# Patient Record
Sex: Male | Born: 1960 | Race: White | Hispanic: No | Smoking: Former smoker
Health system: Southern US, Community
[De-identification: ages and names within clinical notes are randomized; demographics above are authoritative.]

## PROBLEM LIST (undated history)

## (undated) DIAGNOSIS — R918 Other nonspecific abnormal finding of lung field: Secondary | ICD-10-CM

## (undated) DIAGNOSIS — F32A Depression, unspecified: Secondary | ICD-10-CM

## (undated) DIAGNOSIS — R569 Unspecified convulsions: Secondary | ICD-10-CM

## (undated) DIAGNOSIS — I10 Essential (primary) hypertension: Secondary | ICD-10-CM

## (undated) DIAGNOSIS — E785 Hyperlipidemia, unspecified: Secondary | ICD-10-CM

## (undated) DIAGNOSIS — G939 Disorder of brain, unspecified: Secondary | ICD-10-CM

## (undated) DIAGNOSIS — C3490 Malignant neoplasm of unspecified part of unspecified bronchus or lung: Secondary | ICD-10-CM

## (undated) DIAGNOSIS — E119 Type 2 diabetes mellitus without complications: Secondary | ICD-10-CM

## (undated) HISTORY — DX: Type 2 diabetes mellitus without complications: E11.9

## (undated) HISTORY — DX: Hyperlipidemia, unspecified: E78.5

## (undated) HISTORY — DX: Malignant neoplasm of unspecified part of unspecified bronchus or lung: C34.90

## (undated) HISTORY — PX: COLONOSCOPY: SHX174

---

## 2004-01-01 HISTORY — PX: OTHER SURGICAL HISTORY: SHX169

## 2006-09-11 ENCOUNTER — Ambulatory Visit: Payer: Self-pay | Admitting: Anesthesiology

## 2006-09-25 ENCOUNTER — Ambulatory Visit: Payer: Self-pay | Admitting: Anesthesiology

## 2006-10-24 ENCOUNTER — Ambulatory Visit: Payer: Self-pay | Admitting: Anesthesiology

## 2006-11-28 ENCOUNTER — Ambulatory Visit: Payer: Self-pay | Admitting: Anesthesiology

## 2007-01-07 ENCOUNTER — Ambulatory Visit: Payer: Self-pay | Admitting: Anesthesiology

## 2007-02-04 ENCOUNTER — Ambulatory Visit: Payer: Self-pay | Admitting: Anesthesiology

## 2007-02-04 ENCOUNTER — Other Ambulatory Visit: Payer: Self-pay

## 2009-06-07 ENCOUNTER — Ambulatory Visit: Payer: Self-pay

## 2014-03-15 ENCOUNTER — Ambulatory Visit: Payer: Self-pay | Admitting: Gastroenterology

## 2014-03-17 LAB — PATHOLOGY REPORT

## 2014-11-22 ENCOUNTER — Ambulatory Visit: Payer: Self-pay | Admitting: Gastroenterology

## 2015-01-27 DIAGNOSIS — I10 Essential (primary) hypertension: Secondary | ICD-10-CM | POA: Diagnosis not present

## 2015-01-27 DIAGNOSIS — E785 Hyperlipidemia, unspecified: Secondary | ICD-10-CM | POA: Diagnosis not present

## 2015-01-28 DIAGNOSIS — I10 Essential (primary) hypertension: Secondary | ICD-10-CM | POA: Diagnosis not present

## 2015-02-23 DIAGNOSIS — R202 Paresthesia of skin: Secondary | ICD-10-CM | POA: Diagnosis not present

## 2015-02-23 DIAGNOSIS — K219 Gastro-esophageal reflux disease without esophagitis: Secondary | ICD-10-CM | POA: Diagnosis not present

## 2015-02-23 DIAGNOSIS — E785 Hyperlipidemia, unspecified: Secondary | ICD-10-CM | POA: Diagnosis not present

## 2015-04-06 DIAGNOSIS — R05 Cough: Secondary | ICD-10-CM | POA: Diagnosis not present

## 2015-04-25 LAB — SURGICAL PATHOLOGY

## 2015-07-08 DIAGNOSIS — Z1211 Encounter for screening for malignant neoplasm of colon: Secondary | ICD-10-CM | POA: Diagnosis not present

## 2015-07-08 DIAGNOSIS — E538 Deficiency of other specified B group vitamins: Secondary | ICD-10-CM | POA: Diagnosis not present

## 2015-07-08 DIAGNOSIS — Z0189 Encounter for other specified special examinations: Secondary | ICD-10-CM | POA: Diagnosis not present

## 2015-07-08 DIAGNOSIS — R0602 Shortness of breath: Secondary | ICD-10-CM | POA: Diagnosis not present

## 2015-07-08 DIAGNOSIS — J449 Chronic obstructive pulmonary disease, unspecified: Secondary | ICD-10-CM | POA: Diagnosis not present

## 2015-07-08 DIAGNOSIS — Z0000 Encounter for general adult medical examination without abnormal findings: Secondary | ICD-10-CM | POA: Diagnosis not present

## 2015-07-08 DIAGNOSIS — R05 Cough: Secondary | ICD-10-CM | POA: Diagnosis not present

## 2015-07-13 ENCOUNTER — Other Ambulatory Visit: Payer: Self-pay | Admitting: Family Medicine

## 2015-07-13 DIAGNOSIS — R9389 Abnormal findings on diagnostic imaging of other specified body structures: Secondary | ICD-10-CM

## 2015-07-19 ENCOUNTER — Ambulatory Visit
Admission: RE | Admit: 2015-07-19 | Discharge: 2015-07-19 | Disposition: A | Discharge status: Home / Self Care | Payer: Commercial Managed Care - HMO | Source: Ambulatory Visit | Attending: Family Medicine | Admitting: Family Medicine

## 2015-07-19 DIAGNOSIS — J439 Emphysema, unspecified: Secondary | ICD-10-CM | POA: Diagnosis not present

## 2015-07-19 DIAGNOSIS — J432 Centrilobular emphysema: Secondary | ICD-10-CM | POA: Diagnosis not present

## 2015-07-19 DIAGNOSIS — R938 Abnormal findings on diagnostic imaging of other specified body structures: Secondary | ICD-10-CM | POA: Diagnosis present

## 2015-07-19 DIAGNOSIS — K760 Fatty (change of) liver, not elsewhere classified: Secondary | ICD-10-CM | POA: Insufficient documentation

## 2015-07-19 DIAGNOSIS — I2510 Atherosclerotic heart disease of native coronary artery without angina pectoris: Secondary | ICD-10-CM | POA: Insufficient documentation

## 2015-07-19 DIAGNOSIS — R9389 Abnormal findings on diagnostic imaging of other specified body structures: Secondary | ICD-10-CM

## 2015-07-19 HISTORY — DX: Essential (primary) hypertension: I10

## 2015-07-19 MED ORDER — IOHEXOL 300 MG/ML  SOLN
75.0000 mL | Freq: Once | INTRAMUSCULAR | Status: AC | PRN
  Administered 2015-07-19: 75 mL via INTRAVENOUS

## 2015-09-21 DIAGNOSIS — E538 Deficiency of other specified B group vitamins: Secondary | ICD-10-CM | POA: Diagnosis not present

## 2015-09-21 DIAGNOSIS — R7302 Impaired glucose tolerance (oral): Secondary | ICD-10-CM | POA: Diagnosis not present

## 2015-09-21 DIAGNOSIS — I10 Essential (primary) hypertension: Secondary | ICD-10-CM | POA: Diagnosis not present

## 2015-09-21 DIAGNOSIS — J449 Chronic obstructive pulmonary disease, unspecified: Secondary | ICD-10-CM | POA: Diagnosis not present

## 2015-10-11 ENCOUNTER — Encounter: Payer: Commercial Managed Care - HMO | Attending: Family Medicine | Admitting: *Deleted

## 2015-10-11 ENCOUNTER — Encounter: Payer: Self-pay | Admitting: *Deleted

## 2015-10-11 VITALS — BP 140/82 | Ht 75.0 in | Wt 261.7 lb

## 2015-10-11 DIAGNOSIS — E119 Type 2 diabetes mellitus without complications: Secondary | ICD-10-CM | POA: Insufficient documentation

## 2015-10-11 NOTE — Patient Instructions (Addendum)
Check blood sugars 2 x day before breakfast and 2 hrs after supper  3-4 x week Avoid sugar sweetened drinks (soda, tea, coffee, juices) Eat 3 meals day, 1-2 snacks a day Space meals 4-6 hours apart Don't skip meals Bring blood sugar records to the next class Call your doctor for a prescription for:  1. Meter strips (type) One Touch Ultra Blue  checking  3-4 times per week  2. Lancets (type) One Touch Delica       checking  3-4  times per week Quit smoking

## 2015-10-12 ENCOUNTER — Encounter: Payer: Self-pay | Admitting: *Deleted

## 2015-10-12 NOTE — Progress Notes (Signed)
Diabetes Self-Management Education  Visit Type: First/Initial  Appt. Start Time: 1110 Appt. End Time: 1230  10/12/2015  Tyler Gallagher, identified by name and date of birth, is a 54 y.o. male with a diagnosis of Diabetes: Type 2.   ASSESSMENT  Blood pressure 140/82, height '6\' 3"'$  (1.905 m), weight 261 lb 11.2 oz (118.706 kg). Body mass index is 32.71 kg/(m^2).      Diabetes Self-Management Education - 10/11/15 1306    Visit Information   Visit Type First/Initial   Initial Visit   Diabetes Type Type 2   Are you currently following a meal plan? No   Are you taking your medications as prescribed? No  stopped Metformin due to GI upset - will resume   Date Diagnosed 2-3 weeks ago   Health Coping   How would you rate your overall health? Fair   Psychosocial Assessment   Patient Belief/Attitude about Diabetes Other (comment)  "just another problem"   Self-care barriers Unsteady gait/risk for falls  Left aka - uses wheelchair   Self-management support Doctor's office;Friends   Patient Concerns Medication;Nutrition/Meal planning;Monitoring;Glycemic Control;Weight Control   Special Needs None   Preferred Learning Style Auditory;Hands on   Learning Readiness Ready   How often do you need to have someone help you when you read instructions, pamphlets, or other written materials from your doctor or pharmacy? 1 - Never   What is the last grade level you completed in school? 83TD   Complications   Last HgB A1C per patient/outside source 8 %  09/21/15   How often do you check your blood sugar? 0 times/day (not testing)  Provided One Touch Ultra 2 meter and instructed on use. BG upon return demonstration was 119 mg/dL at 12:20 pm - 4 hrs after drinking coffee with sugar.    Have you had a dilated eye exam in the past 12 months? No   Have you had a dental exam in the past 12 months? No  no teeth - has dentures   Are you checking your feet? No   Dietary Intake   Breakfast skips or  eats sausage biscuit   Lunch skips or eats sandwich with chips   Snack (afternoon) peanut butter   Dinner grilled meat - pork chop or chicken with green beans, corn, peas, slaw, turnip greens   Snack (evening) ice cream   Beverage(s) water, sugar sweetened sodas, tea, coffee   Exercise   Exercise Type ADL's   Patient Education   Previous Diabetes Education No   Disease state  Definition of diabetes, type 1 and 2, and the diagnosis of diabetes;Explored patient's options for treatment of their diabetes;Factors that contribute to the development of diabetes   Nutrition management  Role of diet in the treatment of diabetes and the relationship between the three main macronutrients and blood glucose level   Medications Reviewed patients medication for diabetes, action, purpose, timing of dose and side effects.   Monitoring Taught/evaluated SMBG meter.;Purpose and frequency of SMBG.;Identified appropriate SMBG and/or A1C goals.   Chronic complications Relationship between chronic complications and blood glucose control   Psychosocial adjustment Role of stress on diabetes   Personal strategies to promote health Review risk of smoking and offered smoking cessation   Individualized Goals (developed by patient)   Reducing Risk Improve blood sugars Lose weight Quit smoking Become more fit   Outcomes   Expected Outcomes Demonstrated interest in learning. Expect positive outcomes      Individualized Plan for Diabetes Self-Management  Training:   Learning Objective:  Patient will have a greater understanding of diabetes self-management. Patient education plan is to attend individual and/or group sessions per assessed needs and concerns.   Plan:   Patient Instructions  Check blood sugars 2 x day before breakfast and 2 hrs after supper  3-4 x week Avoid sugar sweetened drinks (soda, tea, coffee, juices) Eat 3 meals day, 1-2 snacks a day Space meals 4-6 hours apart Don't skip meals Bring blood  sugar records to the next class Call your doctor for a prescription for:  1. Meter strips (type) One Touch Ultra Blue  checking  3-4 times per week  2. Lancets (type) One Touch Delica       checking  3-4  times per week Quit smoking   Expected Outcomes:  Demonstrated interest in learning. Expect positive outcomes  Education material provided:  General Meal Planning Guidelines Meter - One Touch Ultra 2  If problems or questions, patient to contact team via:   Johny Drilling, [place], [place], CDE [phone]  Future DSME appointment:   October 13, 2015 for Class 1

## 2015-10-13 ENCOUNTER — Telehealth: Payer: Self-pay | Admitting: *Deleted

## 2015-10-13 ENCOUNTER — Ambulatory Visit: Payer: Commercial Managed Care - HMO

## 2015-10-13 NOTE — Telephone Encounter (Signed)
Pt didn't show for class 1 today. Phone call to patient's number with no answer after multiple rings.

## 2015-10-20 ENCOUNTER — Ambulatory Visit: Payer: Commercial Managed Care - HMO

## 2015-10-27 ENCOUNTER — Ambulatory Visit: Payer: Commercial Managed Care - HMO

## 2015-11-07 ENCOUNTER — Encounter: Payer: Self-pay | Admitting: *Deleted

## 2015-12-28 DIAGNOSIS — R7302 Impaired glucose tolerance (oral): Secondary | ICD-10-CM | POA: Diagnosis not present

## 2016-01-01 ENCOUNTER — Inpatient Hospital Stay
Admission: EM | Admit: 2016-01-01 | Discharge: 2016-01-02 | DRG: 123 | Disposition: A | Discharge status: Home / Self Care | Payer: Commercial Managed Care - HMO | Attending: Internal Medicine | Admitting: Internal Medicine

## 2016-01-01 ENCOUNTER — Encounter: Payer: Self-pay | Admitting: Emergency Medicine

## 2016-01-01 ENCOUNTER — Emergency Department: Payer: Commercial Managed Care - HMO

## 2016-01-01 ENCOUNTER — Inpatient Hospital Stay: Payer: Commercial Managed Care - HMO

## 2016-01-01 DIAGNOSIS — Z89619 Acquired absence of unspecified leg above knee: Secondary | ICD-10-CM

## 2016-01-01 DIAGNOSIS — E871 Hypo-osmolality and hyponatremia: Secondary | ICD-10-CM | POA: Diagnosis present

## 2016-01-01 DIAGNOSIS — I10 Essential (primary) hypertension: Secondary | ICD-10-CM | POA: Diagnosis not present

## 2016-01-01 DIAGNOSIS — Z720 Tobacco use: Secondary | ICD-10-CM

## 2016-01-01 DIAGNOSIS — E669 Obesity, unspecified: Secondary | ICD-10-CM

## 2016-01-01 DIAGNOSIS — H539 Unspecified visual disturbance: Secondary | ICD-10-CM

## 2016-01-01 DIAGNOSIS — E785 Hyperlipidemia, unspecified: Secondary | ICD-10-CM

## 2016-01-01 DIAGNOSIS — G459 Transient cerebral ischemic attack, unspecified: Secondary | ICD-10-CM | POA: Diagnosis not present

## 2016-01-01 DIAGNOSIS — E119 Type 2 diabetes mellitus without complications: Secondary | ICD-10-CM | POA: Diagnosis present

## 2016-01-01 DIAGNOSIS — G453 Amaurosis fugax: Principal | ICD-10-CM | POA: Diagnosis present

## 2016-01-01 DIAGNOSIS — I6523 Occlusion and stenosis of bilateral carotid arteries: Secondary | ICD-10-CM | POA: Diagnosis not present

## 2016-01-01 DIAGNOSIS — I639 Cerebral infarction, unspecified: Secondary | ICD-10-CM | POA: Diagnosis present

## 2016-01-01 DIAGNOSIS — Z7982 Long term (current) use of aspirin: Secondary | ICD-10-CM

## 2016-01-01 DIAGNOSIS — F17210 Nicotine dependence, cigarettes, uncomplicated: Secondary | ICD-10-CM | POA: Diagnosis present

## 2016-01-01 DIAGNOSIS — Z833 Family history of diabetes mellitus: Secondary | ICD-10-CM

## 2016-01-01 DIAGNOSIS — H5310 Unspecified subjective visual disturbances: Secondary | ICD-10-CM | POA: Diagnosis not present

## 2016-01-01 DIAGNOSIS — E781 Pure hyperglyceridemia: Secondary | ICD-10-CM | POA: Diagnosis not present

## 2016-01-01 LAB — APTT: aPTT: 27 seconds (ref 24–36)

## 2016-01-01 LAB — BASIC METABOLIC PANEL
Anion gap: 9 (ref 5–15)
BUN: 10 mg/dL (ref 6–20)
CHLORIDE: 100 mmol/L — AB (ref 101–111)
CO2: 31 mmol/L (ref 22–32)
CREATININE: 0.86 mg/dL (ref 0.61–1.24)
Calcium: 9.4 mg/dL (ref 8.9–10.3)
Glucose, Bld: 135 mg/dL — ABNORMAL HIGH (ref 65–99)
POTASSIUM: 3.5 mmol/L (ref 3.5–5.1)
SODIUM: 140 mmol/L (ref 135–145)

## 2016-01-01 LAB — CBC WITH DIFFERENTIAL/PLATELET
BASOS PCT: 1 %
Basophils Absolute: 0.1 10*3/uL (ref 0–0.1)
EOS ABS: 0.1 10*3/uL (ref 0–0.7)
Eosinophils Relative: 1 %
HCT: 48 % (ref 40.0–52.0)
HEMOGLOBIN: 16.8 g/dL (ref 13.0–18.0)
Lymphocytes Relative: 27 %
Lymphs Abs: 2.3 10*3/uL (ref 1.0–3.6)
MCH: 31.8 pg (ref 26.0–34.0)
MCHC: 35 g/dL (ref 32.0–36.0)
MCV: 90.9 fL (ref 80.0–100.0)
Monocytes Absolute: 0.4 10*3/uL (ref 0.2–1.0)
Monocytes Relative: 5 %
Neutro Abs: 5.6 10*3/uL (ref 1.4–6.5)
Neutrophils Relative %: 66 %
Platelets: 227 10*3/uL (ref 150–440)
RBC: 5.28 MIL/uL (ref 4.40–5.90)
RDW: 15 % — ABNORMAL HIGH (ref 11.5–14.5)
WBC: 8.5 10*3/uL (ref 3.8–10.6)

## 2016-01-01 LAB — LIPID PANEL
CHOL/HDL RATIO: 10 ratio
CHOLESTEROL: 249 mg/dL — AB (ref 0–200)
Cholesterol: 233 mg/dL — ABNORMAL HIGH (ref 0–200)
HDL: 25 mg/dL — AB (ref >40)
HDL: 26 mg/dL — AB (ref >40)
LDL Cholesterol: UNDETERMINED mg/dL (ref 0–99)
LDL Cholesterol: UNDETERMINED mg/dL (ref 0–99)
Total CHOL/HDL Ratio: 9 RATIO
Triglycerides: 644 mg/dL — ABNORMAL HIGH (ref <150)
Triglycerides: 514 mg/dL — ABNORMAL HIGH (ref <150)
VLDL: UNDETERMINED mg/dL (ref 0–40)
VLDL: UNDETERMINED mg/dL (ref 0–40)

## 2016-01-01 LAB — PROTIME-INR
INR: 0.96
PROTHROMBIN TIME: 13 s (ref 11.4–15.0)

## 2016-01-01 LAB — GLUCOSE, CAPILLARY: Glucose-Capillary: 136 mg/dL — ABNORMAL HIGH (ref 65–99)

## 2016-01-01 LAB — SEDIMENTATION RATE: Sed Rate: 16 mm/hr (ref 0–20)

## 2016-01-01 MED ORDER — BISOPROLOL-HYDROCHLOROTHIAZIDE 5-6.25 MG PO TABS
1.0000 | ORAL_TABLET | Freq: Every day | ORAL | Status: DC
  Filled 2016-01-01 (×2): qty 1

## 2016-01-01 MED ORDER — PIOGLITAZONE HCL 15 MG PO TABS
30.0000 mg | ORAL_TABLET | Freq: Every day | ORAL | Status: DC
  Administered 2016-01-02: 11:00:00 30 mg via ORAL
  Filled 2016-01-01: qty 2

## 2016-01-01 MED ORDER — GABAPENTIN 300 MG PO CAPS
900.0000 mg | ORAL_CAPSULE | Freq: Three times a day (TID) | ORAL | Status: DC
  Administered 2016-01-01 – 2016-01-02 (×3): 900 mg via ORAL
  Filled 2016-01-01 (×3): qty 3

## 2016-01-01 MED ORDER — VITAMIN B-12 1000 MCG PO TABS
1000.0000 ug | ORAL_TABLET | Freq: Every day | ORAL | Status: DC
  Administered 2016-01-02: 11:00:00 1000 ug via ORAL
  Filled 2016-01-01: qty 1

## 2016-01-01 MED ORDER — ASPIRIN 81 MG PO CHEW
324.0000 mg | CHEWABLE_TABLET | Freq: Once | ORAL | Status: AC
  Administered 2016-01-01: 324 mg via ORAL
  Filled 2016-01-01: qty 4

## 2016-01-01 MED ORDER — METFORMIN HCL ER 500 MG PO TB24
750.0000 mg | ORAL_TABLET | Freq: Two times a day (BID) | ORAL | Status: DC
  Administered 2016-01-01 – 2016-01-02 (×2): 750 mg via ORAL
  Filled 2016-01-01: qty 1
  Filled 2016-01-01: qty 2

## 2016-01-01 MED ORDER — STROKE: EARLY STAGES OF RECOVERY BOOK
Freq: Once | Status: AC
  Administered 2016-01-01: 23:00:00

## 2016-01-01 MED ORDER — ENOXAPARIN SODIUM 40 MG/0.4ML ~~LOC~~ SOLN
40.0000 mg | SUBCUTANEOUS | Status: DC
  Administered 2016-01-01: 23:00:00 40 mg via SUBCUTANEOUS
  Filled 2016-01-01: qty 0.4

## 2016-01-01 MED ORDER — INSULIN ASPART 100 UNIT/ML ~~LOC~~ SOLN: 0.0000 [IU] | Freq: Three times a day (TID) | SUBCUTANEOUS | Status: DC

## 2016-01-01 MED ORDER — GEMFIBROZIL 600 MG PO TABS
300.0000 mg | ORAL_TABLET | Freq: Two times a day (BID) | ORAL | Status: DC
  Administered 2016-01-01 – 2016-01-02 (×2): 300 mg via ORAL
  Filled 2016-01-01 (×3): qty 0.5

## 2016-01-01 MED ORDER — SENNOSIDES-DOCUSATE SODIUM 8.6-50 MG PO TABS: 1.0000 | ORAL_TABLET | Freq: Every evening | ORAL | Status: DC | PRN

## 2016-01-01 NOTE — ED Notes (Signed)
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

## 2016-01-01 NOTE — ED Notes (Signed)
Dr Edd Fabian to consult neuro. SOC not recommended

## 2016-01-01 NOTE — ED Provider Notes (Signed)
[facility] Emergency Department Provider Note  ____________________________________________  Time seen: Approximately 5:06 PM  I have reviewed the triage vital signs and the nursing notes.   HISTORY  Chief Complaint Loss of Vision    HPI Tyler Gallagher is a 55 y.o. male with hypertension and hyponatremia, diabetes, history of remote left AKA who presents for evaluation of waxing and waning vision in the right eye which began approximately 3 hours ago, gradual onset, intermittent, currently severe, no modifying factors. He denies any trauma to the eye. He denies any drainage from the eye. He denies any numbness, weakness, headache, speech difficulty. He denies any pain in the eye but reports that sometimes he has felt pressure in the eye today. Earlier today he reports he lost complete vision in the eye and was only able to appreciate colors but reports then his vision improved significantly for a time and almost returned to normal. He is continuing to see flashes of purple and yellow light. He has otherwise been in his usual state of health without chest pain, difficulty breathing, coughing, sneezing, runny nose, congestion, vomiting, diarrhea, fevers or chills. He report this has happened to him in the past but previously symptoms were brief and resolved completely.   Past Medical History  Diagnosis Date  . Hypertension   . Diabetes mellitus without complication ([place])   . Hyperlipidemia     There are no active problems to display for this patient.   Past Surgical History  Procedure Laterality Date  . Amputation Left 2005    AKA    Current Outpatient Rx  Name  Route  Sig  Dispense  Refill  . albuterol (PROAIR HFA) 108 (90 BASE) MCG/ACT inhaler   Inhalation   Inhale 2 puffs into the lungs every 4 (four) hours as needed.          . bisoprolol-hydrochlorothiazide (ZIAC) 5-6.25 MG tablet   Oral   Take 1 tablet by mouth daily.         .  budesonide-formoterol (SYMBICORT) 160-4.5 MCG/ACT inhaler   Inhalation   Inhale 2 puffs into the lungs 2 (two) times daily.         . Cyanocobalamin (RA VITAMIN B-12 TR) 1000 MCG TBCR   Oral   Take 1 tablet by mouth daily.          Marland Kitchen gabapentin (NEURONTIN) 300 MG capsule   Oral   Take 3 capsules by mouth 3 (three) times daily.         Marland Kitchen gemfibrozil (LOPID) 600 MG tablet   Oral   Take 0.5 tablets by mouth 2 (two) times daily.         . metFORMIN (GLUCOPHAGE-XR) 750 MG 24 hr tablet      Take 1 tablet by mouth for 4-6 days, then 1 tablet by mouth two times a day.         Marland Kitchen omeprazole (PRILOSEC) 20 MG capsule      TAKE 1 CAPSULE (20 MG TOTAL) BY MOUTH ONCE DAILY.           Allergies Review of patient's allergies indicates no known allergies.  Family History  Problem Relation Age of Onset  . Diabetes Sister   . Diabetes Brother     Social History Social History  Substance Use Topics  . Smoking status: Current Every Day Smoker -- 1.00 packs/day for 32 years    Types: Cigarettes  . Smokeless tobacco: Never Used  . Alcohol Use: No  Review of Systems Constitutional: No fever/chills Eyes: No visual changes. ENT: No sore throat. Cardiovascular: Denies chest pain. Respiratory: Denies shortness of breath. Gastrointestinal: No abdominal pain.  No nausea, no vomiting.  No diarrhea.  No constipation. Genitourinary: Negative for dysuria. Musculoskeletal: Negative for back pain. Skin: Negative for rash. Neurological: Negative for headaches, focal weakness or numbness.  10-point ROS otherwise negative.  ____________________________________________   PHYSICAL EXAM:  VITAL SIGNS: ED Triage Vitals  Enc Vitals Group     BP 01/01/16 1642 138/83 mmHg     Pulse Rate 01/01/16 1642 104     Resp 01/01/16 1642 18     Temp 01/01/16 1642 98.1 F (36.7 C)     Temp Source 01/01/16 1642 Oral     SpO2 01/01/16 1642 96 %     Weight 01/01/16 1642 280 lb (127.007 kg)      Height 01/01/16 1642 '6\' 3"'$  (1.905 m)     Head Cir --      Peak Flow --      Pain Score 01/01/16 1642 0     Pain Loc --      Pain Edu? --      Excl. in [place]? --     Constitutional: Alert and oriented. Well appearing and in no acute distress. Eyes: Conjunctivae are normal. PERRL. EOMI. pupils 3 mm bilaterally and briskly reactive to light. Intraocular pressure 16 Head: Atraumatic. Nose: No congestion/rhinnorhea. Mouth/Throat: Mucous membranes are moist.  Oropharynx non-erythematous. Neck: No stridor.  Cardiovascular: Normal rate, regular rhythm. Grossly normal heart sounds.  Good peripheral circulation. Respiratory: Normal respiratory effort.  No retractions. Lungs CTAB. Gastrointestinal: Soft and nontender. No distention.  No CVA tenderness. Genitourinary: deferred Musculoskeletal: No lower extremity tenderness nor edema.  No joint effusions. Neurologic:  Normal speech and language. No gross focal neurologic deficits are appreciated. No gait instability. 5 out of 5 strength in bilateral upper and lower strength, sensation intact to light touch throughout, cranial nerves II through XII intact. Skin:  Skin is warm, dry and intact. No rash noted. Psychiatric: Mood and affect are normal. Speech and behavior are normal.  ____________________________________________   LABS (all labs ordered are listed, but only abnormal results are displayed)  Labs Reviewed  CBC WITH DIFFERENTIAL/PLATELET - Abnormal; Notable for the following:    RDW 15.0 (*)    All other components within normal limits  BASIC METABOLIC PANEL - Abnormal; Notable for the following:    Chloride 100 (*)    Glucose, Bld 135 (*)    All other components within normal limits  GLUCOSE, CAPILLARY - Abnormal; Notable for the following:    Glucose-Capillary 136 (*)    All other components within normal limits  PROTIME-INR  APTT  CBG MONITORING, ED   ____________________________________________  EKG  ED ECG REPORT I,  Joanne Gavel, the attending physician, personally viewed and interpreted this ECG.   Date: 01/01/2016  EKG Time: 17:56  Rate: 100  Rhythm: normal sinus rhythm  Axis: right axis  Intervals:none  ST&T Change: No acute ST elevation. A wave inversions in V2, V3.  ____________________________________________  RADIOLOGY  CT head  IMPRESSION: No acute intracranial abnormality.  Small posterior right frontal chronic infarct.  ____________________________________________   PROCEDURES  Procedure(s) performed: None  Critical Care performed: Yes, see critical care note(s). Total critical care time spent 30 minutes.  ____________________________________________   INITIAL IMPRESSION / ASSESSMENT AND PLAN / ED COURSE  Pertinent labs & imaging results that were available during my care of  the patient were reviewed by me and considered in my medical decision making (see chart for details).  Tyler Gallagher is a 55 y.o. male with hypertension and hyponatremia, diabetes, history of remote left AKA who presents for evaluation of vision in the right eye which began approximately 3 hours ago. On exam, he is generally well-appearing and in no acute distress. On arrival, he had significant visual loss in the right eye however throughout the course of my examination and history taking, this began to improve. He has no other symptoms at this time. And he otherwise has an intact neurological examination. Normal intraocular pressure. Will discuss case with ophthalmology.   ----------------------------------------- 5:40 PM on 01/01/2016 ----------------------------------------- I discussed the case with Dr. Charlann Boxer, on-call for ophthalmology, and described the patient's examination findings as well as the fact that his vision continues to get better without any intervention. Initial visual acuity in the right eye was 20/200, left eye was 20/25, at this time it visual acuity in the right eye is  20/50 and the patient reports that he still sees some small floaters but  his vision is much improved. She reports most likely diagnosis iss an ocular TIA and recommends full stroke workup. We will initiate a code stroke though at this time he is not a candidate for TPA because of rapid improvement of his symptoms. We'll obtain CT head, labs, anticipate admission.  ----------------------------------------- 6:51 PM on 01/01/2016 ----------------------------------------- Labs review. Normal CBC, BMP, coags. CT head negative for any acute intracranial process though there is evidence of old infarcts. I discussed the case with Dr. Irish Elders of neurology who has evaluated the patient and agrees with admission for TIA/stroke workup. At this time, patient reports that his vision is completely back to normal. We'll give full dose aspirin.   ____________________________________________   FINAL CLINICAL IMPRESSION(S) / ED DIAGNOSES  Final diagnoses:  Transient cerebral ischemia, unspecified transient cerebral ischemia type      Joanne Gavel, MD 01/01/16 [phone]

## 2016-01-01 NOTE — Consult Note (Signed)
CC: R eye blurry vision   HPI: Tyler Gallagher is an 55 y.o. male with history of HTN, HLD, DM, 1ppd smoker presents with transient decreased vision from R eye.  Pt states that at 2:30 PM had sudden onset of decreased vision from R eye. In ED initial examination was 20/200 followed by 20/50. Now back to baseline. Was not on any anti platelet therapy.   Past Medical History  Diagnosis Date  . Hypertension   . Diabetes mellitus without complication ([place])   . Hyperlipidemia     Past Surgical History  Procedure Laterality Date  . Amputation Left 2005    AKA    Family History  Problem Relation Age of Onset  . Diabetes Sister   . Diabetes Brother     Social History:  reports that he has been smoking Cigarettes.  He has a 32 pack-year smoking history. He has never used smokeless tobacco. He reports that he does not drink alcohol. His drug history is not on file.  No Known Allergies  Medications: I have reviewed the patient's current medications.    Physical Examination: Blood pressure 112/92, pulse 100, temperature 98.1 F (36.7 C), temperature source Oral, resp. rate 15, height '6\' 3"'$  (1.905 m), weight 280 lb (127.007 kg), SpO2 97 %.    Neurological Examination Mental Status: Alert, oriented, thought content appropriate.  Speech fluent without evidence of aphasia.  Able to follow 3 step commands without difficulty. 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   Tone and  bulk:normal tone throughout; no atrophy noted Sensory: Pinprick and light touch intact throughout, bilaterally Deep Tendon Reflexes: 1+ and symmetric throughout Plantars: Right: downgoing   Left: downgoing Cerebellar: normal finger-to-nose, normal rapid alternating movements and normal heel-to-shin test       Laboratory Studies:   Basic Metabolic Panel:  Recent Labs Lab 01/01/16 1712  NA 140  K 3.5  CL 100*  CO2 31  GLUCOSE 135*  BUN 10  CREATININE 0.86  CALCIUM 9.4    Liver Function Tests: No results for input(s): AST, ALT, ALKPHOS, BILITOT, PROT, ALBUMIN in the last 168 hours. No results for input(s): LIPASE, AMYLASE in the last 168 hours. No results for input(s): AMMONIA in the last 168 hours.  CBC:  Recent Labs Lab 01/01/16 1712  WBC 8.5  NEUTROABS 5.6  HGB 16.8  HCT 48.0  MCV 90.9  PLT 227    Cardiac Enzymes: No results for input(s): CKTOTAL, CKMB, CKMBINDEX, TROPONINI in the last 168 hours.  BNP: Invalid input(s): POCBNP  CBG:  Recent Labs Lab 01/01/16 1752  GLUCAP 136*    Microbiology: No results found for this or any previous visit.  Coagulation Studies:  Recent Labs  01/01/16 1712  LABPROT 13.0  INR 0.96    Urinalysis: No results for input(s): COLORURINE, LABSPEC, PHURINE, GLUCOSEU, HGBUR, BILIRUBINUR, KETONESUR, PROTEINUR, UROBILINOGEN, NITRITE, LEUKOCYTESUR in the last 168 hours.  Invalid input(s): APPERANCEUR  Lipid Panel:  No results found for: CHOL, TRIG,  HDL, CHOLHDL, VLDL, LDLCALC  HgbA1C: No results found for: HGBA1C  Urine Drug Screen:  No results found for: LABOPIA, COCAINSCRNUR, LABBENZ, AMPHETMU, THCU, LABBARB  Alcohol Level: No results for input(s): ETH in the last 168 hours.  Other results: EKG: normal EKG, normal sinus rhythm, unchanged from previous tracings.  Imaging: Ct Head Wo Contrast  01/01/2016  CLINICAL DATA:  Vision loss in right arm. EXAM: CT HEAD WITHOUT CONTRAST TECHNIQUE: Contiguous axial  images were obtained from the base of the skull through the vertex without intravenous contrast. COMPARISON:  None. FINDINGS: There is no evidence for acute hemorrhage, hydrocephalus, mass lesion, or abnormal extra-axial fluid collection. No definite CT evidence for acute infarction. Bold posterior right frontal infarct noted. Small lacunar infarct identified in the left frontal lobe. The visualized paranasal sinuses and mastoid air cells are clear. IMPRESSION: No acute intracranial abnormality. Small posterior right frontal chronic infarct. Electronically Signed   By: Misty Stanley M.D.   On: 01/01/2016 17:59     Assessment/Plan:  55 y.o. male with history of HTN, HLD, DM, 1ppd smoker presents with transient decreased vision from R eye.  Pt states that at 2:30 PM had sudden onset of decreased vision from R eye. In ED initial examination was 20/200 followed by 20/50. Now back to baseline. Was not on any anti platelet therapy.   - ASA and statin daily - R eye pressure of 16 - MRI brain and MRA head - likely due to atherosclerotic dz - s/p discussion in regard to smoking cessation.  - Likely amaurosis fugax that resolved and is in danger for retinal artery occlusion.  Leotis Pain  01/01/2016, 7:08 PM

## 2016-01-01 NOTE — H&P (Signed)
[facility] [place] NAME: Tyler Gallagher    MR#:  [id number]  DATE OF BIRTH:  11-24-1961  DATE OF ADMISSION:  01/01/2016  PRIMARY CARE PHYSICIAN: Juluis Pitch, MD   REQUESTING/REFERRING PHYSICIAN: dr Edd Fabian  CHIEF COMPLAINT:  Lesion changes right eye more than left today.  HISTORY OF PRESENT ILLNESS:  Tyler Gallagher  is a 55 y.o. male with a known history of left above-the-knee amputation in 2005, hypertension, type 2 diabetes, hyperlipidemia, ongoing tobacco abuse comes to the emergency room with complaints of right-sided eye vision changes since 2:30 this afternoon. Patient reports going to his convenience store and thereafter to grocery store where he felt his right eye started having some purple color vision changes blurred vision along with loss of vision. On an hour in the right eye. Currently during my evaluation patient has regained most of his vision with some mild blurred vision in the right eye. He denies any dysarthria or any focal weakness. Patient is being admitted for possible TIA. ER physician talked with ophthalmologist on call and given his symptoms and visual changes it was thought patient has ocular TIA  CT of the head is negative. No history of stroke in the past.   PAST MEDICAL HISTORY:   Past Medical History  Diagnosis Date  . Hypertension   . Diabetes mellitus without complication ([place])   . Hyperlipidemia     PAST SURGICAL HISTOIRY:   Past Surgical History  Procedure Laterality Date  . Amputation Left 2005    AKA    SOCIAL HISTORY:   Social History  Substance Use Topics  . Smoking status: Current Every Day Smoker -- 1.00 packs/day for 32 years    Types: Cigarettes  . Smokeless tobacco: Never Used  . Alcohol Use: No    FAMILY HISTORY:   Family History  Problem Relation Age of Onset  . Diabetes Sister   . Diabetes Brother     DRUG ALLERGIES:  No Known Allergies  REVIEW OF SYSTEMS:   Review of Systems  Constitutional: Negative for fever, chills and weight loss.  HENT: Negative for ear discharge, ear pain and nosebleeds.   Eyes: Positive for blurred vision. Negative for pain and discharge.  Respiratory: Negative for sputum production, shortness of breath, wheezing and stridor.   Cardiovascular: Negative for chest pain, palpitations, orthopnea and PND.  Gastrointestinal: Negative for nausea, vomiting, abdominal pain and diarrhea.  Genitourinary: Negative for urgency and frequency.  Musculoskeletal: Negative for back pain and joint pain.  Neurological: Negative for sensory change, speech change, focal weakness and weakness.  Psychiatric/Behavioral: Negative for depression and hallucinations. The patient is not nervous/anxious.   All other systems reviewed and are negative.    MEDICATIONS AT HOME:   Prior to Admission medications   Medication Sig Start Date End Date Taking? Authorizing Provider  albuterol (PROAIR HFA) 108 (90 BASE) MCG/ACT inhaler Inhale 2 puffs into the lungs every 4 (four) hours as needed.  07/21/15 07/20/16  Historical Provider, MD  bisoprolol-hydrochlorothiazide (ZIAC) 5-6.25 MG tablet Take 1 tablet by mouth daily. 09/08/15   Historical Provider, MD  budesonide-formoterol (SYMBICORT) 160-4.5 MCG/ACT inhaler Inhale 2 puffs into the lungs 2 (two) times daily. 07/08/15 07/07/16  Historical Provider, MD  Cyanocobalamin (RA VITAMIN B-12 TR) 1000 MCG TBCR Take 1 tablet by mouth daily.     Historical Provider, MD  gabapentin (NEURONTIN) 300 MG capsule Take 3 capsules by mouth 3 (three) times daily. 03/03/15   Historical Provider,  MD  gemfibrozil (LOPID) 600 MG tablet Take 0.5 tablets by mouth 2 (two) times daily. 01/07/15   Historical Provider, MD  metFORMIN (GLUCOPHAGE-XR) 750 MG 24 hr tablet Take 1 tablet by mouth for 4-6 days, then 1 tablet by mouth two times a day. 09/29/15   Historical Provider, MD  omeprazole (PRILOSEC) 20 MG capsule TAKE 1 CAPSULE (20 MG TOTAL) BY  MOUTH ONCE DAILY. 03/16/15   Historical Provider, MD      VITAL SIGNS:  Blood pressure 112/92, pulse 100, temperature 98.1 F (36.7 C), temperature source Oral, resp. rate 15, height '6\' 3"'$  (1.905 m), weight 127.007 kg (280 lb), SpO2 97 %.  PHYSICAL EXAMINATION:  GENERAL:  55 y.o.-year-old patient lying in the bed with no acute distress.  EYES: Pupils equal, round, reactive to light and accommodation. No scleral icterus. Extraocular muscles intact.  HEENT: Head atraumatic, normocephalic. Oropharynx and nasopharynx clear.  NECK:  Supple, no jugular venous distention. No thyroid enlargement, no tenderness.  LUNGS: Normal breath sounds bilaterally, no wheezing, rales,rhonchi or crepitation. No use of accessory muscles of respiration.  CARDIOVASCULAR: S1, S2 normal. No murmurs, rubs, or gallops.  ABDOMEN: Soft, nontender, nondistended. Bowel sounds present. No organomegaly or mass.  EXTREMITIES: No pedal edema, cyanosis, or clubbing. Left above knee amputation  NEUROLOGIC: Cranial nerves II through XII are intact. Muscle strength 5/5 in all extremities. Sensation intact. Gait not checked.  PSYCHIATRIC: The patient is alert and oriented x 3.  SKIN: No obvious rash, lesion, or ulcer.   LABORATORY PANEL:   CBC  Recent Labs Lab 01/01/16 1712  WBC 8.5  HGB 16.8  HCT 48.0  PLT 227   ------------------------------------------------------------------------------------------------------------------  Chemistries   Recent Labs Lab 01/01/16 1712  NA 140  K 3.5  CL 100*  CO2 31  GLUCOSE 135*  BUN 10  CREATININE 0.86  CALCIUM 9.4   ------------------------------------------------------------------------------------------------------------------  Cardiac Enzymes No results for input(s): TROPONINI in the last 168 hours. ------------------------------------------------------------------------------------------------------------------  RADIOLOGY:  Ct Head Wo Contrast  01/01/2016   CLINICAL DATA:  Vision loss in right arm. EXAM: CT HEAD WITHOUT CONTRAST TECHNIQUE: Contiguous axial images were obtained from the base of the skull through the vertex without intravenous contrast. COMPARISON:  None. FINDINGS: There is no evidence for acute hemorrhage, hydrocephalus, mass lesion, or abnormal extra-axial fluid collection. No definite CT evidence for acute infarction. Bold posterior right frontal infarct noted. Small lacunar infarct identified in the left frontal lobe. The visualized paranasal sinuses and mastoid air cells are clear. IMPRESSION: No acute intracranial abnormality. Small posterior right frontal chronic infarct. Electronically Signed   By: Misty Stanley M.D.   On: 01/01/2016 17:59   Dg Chest Portable 1 View  01/01/2016  CLINICAL DATA:  Patient with cloudy vision. EXAM: PORTABLE CHEST 1 VIEW COMPARISON:  Chest CT 07/19/2015 FINDINGS: Normal cardiac and mediastinal contours. No consolidative pulmonary opacities. No pleural effusion pneumothorax. Apical emphysematous change. IMPRESSION: No acute cardiopulmonary process. Electronically Signed   By: Lovey Newcomer M.D.   On: 01/01/2016 19:09    EKG:   Sinus tachycardia. Right axis deviation. Nonspecific T wave abnormality in anterolateral leads.  IMPRESSION AND PLAN:   Tyler Gallagher  is a 55 y.o. male with a known history of left above-the-knee amputation in 2005, hypertension, type 2 diabetes, hyperlipidemia, ongoing tobacco abuse comes to the emergency room with complaints of right-sided eye vision changes since 2:30 this afternoon.  1. acute visual disturbance suspected due to possible ocular TIA -Admit to medical floor -Aspirin 325  by mouth daily -Neuro checks every 2 hourly for 24 hours then every shift per protocol -MRI/MRA of the brain, bilateral ultrasound carotid, echo of the heart -Check lipid profile  2. Hypertension we'll avoid sudden drops in blood pressure. Allow some permissive hypertension. Continue  bisoprolol/hydrochlorothiazide  3. Type 2 diabetes continue Actos and metformin Sliding scale insulin  4. Tobacco abuse patient advised smoking cessation discussed at length with patient and wife about 3-4 minutes spent.     All the records are reviewed and case discussed with ED provider. Management plans discussed with the patient, family and they are in agreement.  CODE STATUS: Full  TOTAL TIME TAKING CARE OF THIS PATIENT: 50mns.    Kullen Tomasetti M.D on 01/01/2016 at 7:26 PM  Between 7am to 6pm - Pager - [phone]  After 6pm go to [email or web address] - password [email or web address]  Office  Gallagher CC: Primary care physician; DJuluis Pitch MD

## 2016-01-01 NOTE — ED Notes (Addendum)
Pt reports vision loss in his r.eye since 3pm. At this time reports he has some vision back but sees "purple and yellow". States he has had this happen before awhile ago. Also reports R. Facial pressure. R eye 20/200  L eye 20/25.  pt also reports he is a diabetic

## 2016-01-02 ENCOUNTER — Inpatient Hospital Stay
Admit: 2016-01-02 | Discharge: 2016-01-02 | Disposition: A | Discharge status: Home / Self Care | Payer: Commercial Managed Care - HMO | Attending: Internal Medicine | Admitting: Internal Medicine

## 2016-01-02 ENCOUNTER — Inpatient Hospital Stay: Payer: Commercial Managed Care - HMO

## 2016-01-02 DIAGNOSIS — E119 Type 2 diabetes mellitus without complications: Secondary | ICD-10-CM

## 2016-01-02 DIAGNOSIS — E785 Hyperlipidemia, unspecified: Secondary | ICD-10-CM

## 2016-01-02 DIAGNOSIS — Z720 Tobacco use: Secondary | ICD-10-CM

## 2016-01-02 DIAGNOSIS — Z89619 Acquired absence of unspecified leg above knee: Secondary | ICD-10-CM

## 2016-01-02 DIAGNOSIS — E669 Obesity, unspecified: Secondary | ICD-10-CM

## 2016-01-02 DIAGNOSIS — E781 Pure hyperglyceridemia: Secondary | ICD-10-CM

## 2016-01-02 LAB — TSH: TSH: 2.498 u[IU]/mL (ref 0.350–4.500)

## 2016-01-02 LAB — GLUCOSE, CAPILLARY
GLUCOSE-CAPILLARY: 109 mg/dL — AB (ref 65–99)
Glucose-Capillary: 115 mg/dL — ABNORMAL HIGH (ref 65–99)
Glucose-Capillary: 115 mg/dL — ABNORMAL HIGH (ref 65–99)
Glucose-Capillary: 88 mg/dL (ref 65–99)

## 2016-01-02 LAB — C-REACTIVE PROTEIN: CRP: 1.6 mg/dL — ABNORMAL HIGH (ref <1.0)

## 2016-01-02 LAB — HEMOGLOBIN A1C: HEMOGLOBIN A1C: 5.6 % (ref 4.0–6.0)

## 2016-01-02 MED ORDER — NICOTINE POLACRILEX 2 MG MT GUM: 2.0000 mg | CHEWING_GUM | OROMUCOSAL | Status: DC | PRN

## 2016-01-02 MED ORDER — ATORVASTATIN CALCIUM 20 MG PO TABS: 20.0000 mg | ORAL_TABLET | Freq: Every day | ORAL | Status: DC

## 2016-01-02 MED ORDER — NICOTINE 21 MG/24HR TD PT24
21.0000 mg | MEDICATED_PATCH | Freq: Every day | TRANSDERMAL | Status: DC
  Administered 2016-01-02: 17:00:00 21 mg via TRANSDERMAL
  Filled 2016-01-02: qty 1

## 2016-01-02 MED ORDER — ASPIRIN EC 325 MG PO TBEC: 325.0000 mg | DELAYED_RELEASE_TABLET | Freq: Every day | ORAL | Status: DC

## 2016-01-02 NOTE — Progress Notes (Signed)
*  PRELIMINARY RESULTS* Echocardiogram 2D Echocardiogram has been performed.  Tyler Gallagher 01/02/2016, 2:12 PM

## 2016-01-02 NOTE — Plan of Care (Signed)
Problem: Education: Goal: Knowledge of [facility] Education information/materials will improve Outcome: Progressing Oriented to unit and stroke literature provided at bedside.  Problem: Safety: Goal: Ability to remain free from injury will improve Outcome: Progressing High fall risk.  Bed alarm utilized and pt uses call light appropriately.  Problem: Physical Regulation: Goal: Ability to maintain clinical measurements within normal limits will improve Outcome: Progressing NIH (0).  Neuro checks q 2 remain WNL.  Problem: Education: Goal: Knowledge of disease or condition will improve Outcome: Progressing Pt reading stroke booklet and verbalizes need to stop smoking and control diabetes. Goal: Knowledge of patient specific risk factors addressed and post discharge goals established will improve Outcome: Progressing Smoking, cholesterol and diet/blood sugar control discussed and pt verbalizes need to quit smoking, control diet.

## 2016-01-02 NOTE — Evaluation (Signed)
Physical Therapy Evaluation Patient Details Name: RUDIE SERMONS MRN: [id number] DOB: [birth year] Today's Date: 01/02/2016   History of Present Illness  presented to ER secondary to R eye vision changes; admitted for TIA vs. CVA.  Head CT negative for acute change (noted with chronic R posterior infarct).  Per neurology, likely amaurosis fugax.  Patient reporting all symptoms now fully resolved.  Clinical Impression  Upon evaluation, patient alert and oriented; follows all commands and demonstrates good insight/safety awareness.  Bilat UE/LE strength and ROM grossly WFL and symmetrical (except L LE AKA); no focal weakness, sensory or coordination deficit noted.  Able to complete bed mobility indep; sit/stand, basic transfers without assist device, mod indep, without LOB or safety concern (does not use prosthesis at baseline).  Patient reports all symptoms (primarily vision changes) have now fully resolved and he is at baseline level of functional performance.  No skilled PT needs identified at this time; patient/family in agreement.  Will complete initial order; please re-consult should needs change.    Follow Up Recommendations No PT follow up    Equipment Recommendations       Recommendations for Other Services       Precautions / Restrictions Precautions Precautions: Fall Restrictions Weight Bearing Restrictions: No      Mobility  Bed Mobility Overal bed mobility: Independent                Transfers Overall transfer level: Modified independent               General transfer comment: SPT, bed/WC, mod indep bilat (tends to complete 360 degree turn when transferring towards L).  does require bilat UE for support, but able to complete without LOB or safety concern.  Ambulation/Gait             General Gait Details: non-ambulatory at baseline  Stairs            Wheelchair Mobility    Modified Rankin (Stroke Patients Only)       Balance Overall  balance assessment: Modified Independent  Able to don pants sitting edge of bed indep; good ability to maintain sitting balance in variety of unsupported sitting positions (short-sitting edge of bed, 'indian-style' to don socks) Sit/stand without assist device to pull pants over hips, sup/mod indep; slightly impulsive, but no LOB                                         Pertinent Vitals/Pain Pain Assessment: No/denies pain    Home Living Family/patient expects to be discharged to:: Private residence Living Arrangements: Spouse/significant other;Children Available Help at Discharge: Family Type of Home: House Home Access: Ramped entrance     Home Layout: One level Home Equipment: Wheelchair - manual      Prior Function Level of Independence: Independent with assistive device(s)         Comments: Manual WC as primary mobility; able to complete SPT to/from all surfaces without assist device, mod indep.  Denies fall history.  + driving.     Hand Dominance        Extremity/Trunk Assessment   Upper Extremity Assessment: Overall WFL for tasks assessed           Lower Extremity Assessment: Overall WFL for tasks assessed (L LE AKA (approx 2005))         Communication   Communication: No difficulties  Cognition Arousal/Alertness:  Awake/alert Behavior During Therapy: WFL for tasks assessed/performed Overall Cognitive Status: Within Functional Limits for tasks assessed                      General Comments      Exercises        Assessment/Plan    PT Assessment Patent does not need any further PT services  PT Diagnosis     PT Problem List    PT Treatment Interventions     PT Goals (Current goals can be found in the Care Plan section) Acute Rehab PT Goals Patient Stated Goal: "I'm ready to go home" PT Goal Formulation: All assessment and education complete, DC therapy    Frequency     Barriers to discharge        Co-evaluation                End of Session   Activity Tolerance: Patient tolerated treatment well Patient left: in bed;with call bell/phone within reach;with bed alarm set           Time: 8335-8251 PT Time Calculation (min) (ACUTE ONLY): 10 min   Charges:   PT Evaluation $Initial PT Evaluation Tier I: 1 Procedure     PT G Codes:        Holston Oyama H. Owens Shark, PT, DPT, NCS 01/02/2016, 10:45 AM [phone]

## 2016-01-02 NOTE — Progress Notes (Signed)
VSS. No neuro deficits. NIH score 0. Denies pain. Pt is discharged home. Follow up appointments and meds reviewed with pt. Pt instructed to schedule follow up appointments tomorrow. Pt verbalized understanding. RX given to pt. Discharge instructions given and explained to pt. Escorted on a wheelchair.

## 2016-01-02 NOTE — Progress Notes (Signed)
OT Cancellation Note  Patient Details Name: Tyler Gallagher MRN: [id number] DOB: [birth year]   Cancelled Treatment:    Reason Eval/Treat Not Completed: PT screened, no needs identified, will sign off. Patient appears to be back to premorbid status. No Occupational Therapy needs at this time.  Sharon Mt 01/02/2016, 10:33 AM

## 2016-01-02 NOTE — Progress Notes (Signed)
Speech Therapy Note: received order, reviewed chart notes and consulted NSG re: pt's status. Met w/ pt who denied any s/s of dysphagia or speech-language issues. Pt conversed appropriately w/ SLP and had recently finished breakfast meal w/out issues. NSG agreed. No further skilled ST services indicated at this time. NSG to reconsult if any change in status.

## 2016-01-02 NOTE — Discharge Summary (Signed)
[facility] [place] NAME: Tyler Gallagher    MR#:  [id number]  DATE OF BIRTH:  [birth year]  DATE OF ADMISSION:  01/01/2016 ADMITTING PHYSICIAN: Fritzi Mandes, MD  DATE OF DISCHARGE: No discharge date for patient encounter.  PRIMARY CARE PHYSICIAN: Juluis Pitch, MD     ADMISSION DIAGNOSIS:  CVA (cerebral infarction) [I63.9] Transient cerebral ischemia, unspecified transient cerebral ischemia type [G45.9]  DISCHARGE DIAGNOSIS:  Principal Problem:   Amaurosis fugax Active Problems:   TIA (transient ischemic attack)   Hyperlipidemia   Hypertriglyceridemia   Tobacco abuse   Type 2 diabetes mellitus without complication, without long-term current use of insulin (HCC)   Obesity   S/P AKA (above knee amputation) unilateral ([place])   SECONDARY DIAGNOSIS:   Past Medical History  Diagnosis Date  . Hypertension   . Diabetes mellitus without complication ([place])   . Hyperlipidemia     .pro HOSPITAL COURSE:   The patient is 55 year old Caucasian male with past medical history significant for history of ongoing tobacco abuse, essential hypertension, diabetes mellitus, stable. 2. Hyperlipidemia/hypertriglyceridemia who presents to the hospital with complaints of decreased vision in the right eye. In emergency room, initial examination revealed a 2002 200, followed by 20/50. Patient's vision improved significantly over a matter of hours. He feels good today. Patient was seen by neurologist who felt that patient had amaurosis fugax, he recommended to initiate patient on aspirin as well as statin. Get MRI of brain as well as MRA of head and had discussion about smoking cessation. Patient was advised that although the patient's amaurosis fugax resolved. The patient is in danger of retinal artery occlusion. Patient's lipid panel was checked and it was found to be markedly abnormal, total cholesterol level was 233, triglycerides were 514, HDL was 26  and LDL as well as VLDL went not able to be calculated. Patient underwent ultrasound of carotid arteries bilaterally which revealed less than 50% stenosis in left and the right internal carotid arteries. Echocardiogram was performed, however, results of that are still pending. MRI or MRA could not have been performed due to being holiday, unfortunately. Due to patient having significant hyper triglyceridemia, , endocrinology consultation was suggested as outpatient. Patient is to see neurologist assessed as outpatient as well and make decisions about MRI as well as MRA. Meanwhile, he is to continue aspirin therapy, Lipitor. Lipitor was added to his regimen Discussion by problem 1. Amaurosis fugax, concerning for TIA at risk of central retinal artery occlusion, patient is to continue aspirin and Lipitor as well as Lopid, he is to follow-up with neurologist outpatient for MRI and MRA as outpatient, carotid ultrasound was unremarkable. Echocardiogram is pending 2. Diabetes mellitus type 2, hemoglobin A1c 5.6, well controlled. Patient is to continue metformin as well as Actos, watch for hypoglycemia 3. Hyperlipidemia/hypertriglyceridemia. Patient's TSH is within normal limits. Patient's hemoglobin A1c is also good, signifying good diabetes control. Hypertriglyceridemia therapy is to be continued with Lopid and Lipitor was added, watch for myopathy 4 . Tobacco abuse. Discussed this patient for 4 minutes. Nicotine replacement therapy was recommended  DISCHARGE CONDITIONS:   Stable  CONSULTS OBTAINED:     DRUG ALLERGIES:  No Known Allergies  DISCHARGE MEDICATIONS:   Current Discharge Medication List    START taking these medications   Details  aspirin EC 325 MG tablet Take 1 tablet (325 mg total) by mouth daily. Qty: 30 tablet, Refills: 0      CONTINUE these medications which have  NOT CHANGED   Details  bisoprolol-hydrochlorothiazide (ZIAC) 5-6.25 MG tablet Take 1 tablet by mouth daily.     Cyanocobalamin (RA VITAMIN B-12 TR) 1000 MCG TBCR Take 1 tablet by mouth daily.     gabapentin (NEURONTIN) 300 MG capsule Take 900 mg by mouth 3 (three) times daily.    gemfibrozil (LOPID) 600 MG tablet Take 0.5 tablets by mouth 2 (two) times daily.    metFORMIN (GLUCOPHAGE-XR) 750 MG 24 hr tablet Take 750 mg by mouth 2 (two) times daily.    pioglitazone (ACTOS) 30 MG tablet Take 30 mg by mouth daily.         DISCHARGE INSTRUCTIONS:    Patient is to follow-up with primary care physician, endocrinologist, neurologist. Patient was advised to stop smoking  If you experience worsening of your admission symptoms, develop shortness of breath, life threatening emergency, suicidal or homicidal thoughts you must seek medical attention immediately by calling 911 or calling your MD immediately  if symptoms less severe.  You Must read complete instructions/literature along with all the possible adverse reactions/side effects for all the Medicines you take and that have been prescribed to you. Take any new Medicines after you have completely understood and accept all the possible adverse reactions/side effects.   Please note  You were cared for by a hospitalist during your hospital stay. If you have any questions about your discharge medications or the care you received while you were in the hospital after you are discharged, you can call the unit and asked to speak with the hospitalist on call if the hospitalist that took care of you is not available. Once you are discharged, your primary care physician will handle any further medical issues. Please note that NO REFILLS for any discharge medications will be authorized once you are discharged, as it is imperative that you return to your primary care physician (or establish a relationship with a primary care physician if you do not have one) for your aftercare needs so that they can reassess your need for medications and monitor your lab  values.    Today   CHIEF COMPLAINT:   Chief Complaint  Patient presents with  . Loss of Vision    HISTORY OF PRESENT ILLNESS:  Tyler Gallagher  is a 55 y.o. male with a known history of ongoing tobacco abuse, essential hypertension, diabetes mellitus, stable. 2. Hyperlipidemia/hypertriglyceridemia who presents to the hospital with complaints of decreased vision in the right eye. In emergency room, initial examination revealed a 2002 200, followed by 20/50. Patient's vision improved significantly over a matter of hours. He feels good today. Patient was seen by neurologist who felt that patient had amaurosis fugax, he recommended to initiate patient on aspirin as well as statin. Get MRI of brain as well as MRA of head and had discussion about smoking cessation. Patient was advised that although the patient's amaurosis fugax resolved. The patient is in danger of retinal artery occlusion. Patient's lipid panel was checked and it was found to be markedly abnormal, total cholesterol level was 233, triglycerides were 514, HDL was 26 and LDL as well as VLDL went not able to be calculated. Patient underwent ultrasound of carotid arteries bilaterally which revealed less than 50% stenosis in left and the right internal carotid arteries. Echocardiogram was performed, however, results of that are still pending. MRI or MRA could not have been performed due to being holiday, unfortunately. Due to patient having significant hyper triglyceridemia, , endocrinology consultation was suggested as outpatient. Patient  is to see neurologist assessed as outpatient as well and make decisions about MRI as well as MRA. Meanwhile, he is to continue aspirin therapy, Lipitor. Lipitor was added to his regimen Discussion by problem 1. Amaurosis fugax, concerning for TIA at risk of central retinal artery occlusion, patient is to continue aspirin and Lipitor as well as Lopid, he is to follow-up with neurologist outpatient for MRI and  MRA as outpatient, carotid ultrasound was unremarkable. Echocardiogram is pending 2. Diabetes mellitus type 2, hemoglobin A1c 5.6, well controlled. Patient is to continue metformin as well as Actos, watch for hypoglycemia 3. Hyperlipidemia/hypertriglyceridemia. Patient's TSH is within normal limits. Patient's hemoglobin A1c is also good, signifying good diabetes control. Hypertriglyceridemia therapy is to be continued with Lopid and Lipitor was added, watch for myopathy 4 . Tobacco abuse. Discussed this patient for 4 minutes. Nicotine replacement therapy was recommended 5. Obesity, TSH was checked was normal, as well as hemoglobin A1c, signifying good diabetes control, patient was advised to lose weight   VITAL SIGNS:  Blood pressure 136/92, pulse 77, temperature 97.9 F (36.6 C), temperature source Oral, resp. rate 18, height '6\' 3"'$  (1.905 m), weight 119.296 kg (263 lb), SpO2 95 %.  I/O:   Intake/Output Summary (Last 24 hours) at 01/02/16 1449 Last data filed at 01/02/16 1141  Gross per 24 hour  Intake    240 ml  Output    700 ml  Net   -460 ml    PHYSICAL EXAMINATION:  GENERAL:  55 y.o.-year-old patient lying in the bed with no acute distress.  EYES: Pupils equal, round, reactive to light and accommodation. No scleral icterus. Extraocular muscles intact.  HEENT: Head atraumatic, normocephalic. Oropharynx and nasopharynx clear.  NECK:  Supple, no jugular venous distention. No thyroid enlargement, no tenderness.  LUNGS: Normal breath sounds bilaterally, no wheezing, rales,rhonchi or crepitation. No use of accessory muscles of respiration.  CARDIOVASCULAR: S1, S2 normal. No murmurs, rubs, or gallops.  ABDOMEN: Soft, non-tender, non-distended. Bowel sounds present. No organomegaly or mass.  EXTREMITIES: No pedal edema, cyanosis, or clubbing.  NEUROLOGIC: Cranial nerves II through XII are intact. Muscle strength 5/5 in all extremities. Sensation intact. Gait not checked.  PSYCHIATRIC: The  patient is alert and oriented x 3.  SKIN: No obvious rash, lesion, or ulcer.   DATA REVIEW:   CBC  Recent Labs Lab 01/01/16 1712  WBC 8.5  HGB 16.8  HCT 48.0  PLT 227    Chemistries   Recent Labs Lab 01/01/16 1712  NA 140  K 3.5  CL 100*  CO2 31  GLUCOSE 135*  BUN 10  CREATININE 0.86  CALCIUM 9.4    Cardiac Enzymes No results for input(s): TROPONINI in the last 168 hours.  Microbiology Results  No results found for this or any previous visit.  RADIOLOGY:  Ct Head Wo Contrast  01/01/2016  CLINICAL DATA:  Vision loss in right arm. EXAM: CT HEAD WITHOUT CONTRAST TECHNIQUE: Contiguous axial images were obtained from the base of the skull through the vertex without intravenous contrast. COMPARISON:  None. FINDINGS: There is no evidence for acute hemorrhage, hydrocephalus, mass lesion, or abnormal extra-axial fluid collection. No definite CT evidence for acute infarction. Bold posterior right frontal infarct noted. Small lacunar infarct identified in the left frontal lobe. The visualized paranasal sinuses and mastoid air cells are clear. IMPRESSION: No acute intracranial abnormality. Small posterior right frontal chronic infarct. Electronically Signed   By: Misty Stanley M.D.   On: 01/01/2016 17:59  US Carotid Bilateral  01/02/2016  CLINICAL DATA:  Loss of vision in right eye yesterday. EXAM: BILATERAL CAROTID DUPLEX ULTRASOUND TECHNIQUE: Pearline Cables scale imaging, color Doppler and duplex ultrasound were performed of bilateral carotid and vertebral arteries in the neck. COMPARISON:  None. FINDINGS: Criteria: Quantification of carotid stenosis is based on velocity parameters that correlate the residual internal carotid diameter with NASCET-based stenosis levels, using the diameter of the distal internal carotid lumen as the denominator for stenosis measurement. The following velocity measurements were obtained: RIGHT ICA:  82 cm/sec CCA:  95 cm/sec SYSTOLIC ICA/CCA RATIO:  0.9 DIASTOLIC  ICA/CCA RATIO:  1.9 ECA:  98 cm/sec LEFT ICA:  78 cm/sec CCA:  94 cm/sec SYSTOLIC ICA/CCA RATIO:  0.8 DIASTOLIC ICA/CCA RATIO:  1.5 ECA:  73 cm/sec RIGHT CAROTID ARTERY: Moderate irregular soft plaque is present in the bulb. Low resistance internal carotid Doppler pattern is preserved. RIGHT VERTEBRAL ARTERY:  Antegrade with a normal Doppler pattern. LEFT CAROTID ARTERY: Mild smooth plaque in the bulb. Low resistance internal carotid Doppler pattern is preserved. LEFT VERTEBRAL ARTERY:  Antegrade with a normal Doppler pattern. IMPRESSION: Less than 50% stenosis in the right and left internal carotid arteries. Electronically Signed   By: Marybelle Killings M.D.   On: 01/02/2016 09:49   Dg Chest Portable 1 View  01/01/2016  CLINICAL DATA:  Patient with cloudy vision. EXAM: PORTABLE CHEST 1 VIEW COMPARISON:  Chest CT 07/19/2015 FINDINGS: Normal cardiac and mediastinal contours. No consolidative pulmonary opacities. No pleural effusion pneumothorax. Apical emphysematous change. IMPRESSION: No acute cardiopulmonary process. Electronically Signed   By: Lovey Newcomer M.D.   On: 01/01/2016 19:09    EKG:   Orders placed or performed during the hospital encounter of 01/01/16  . ED EKG  . ED EKG  . EKG 12-Lead  . EKG 12-Lead      Management plans discussed with the patient, family and they are in agreement.  CODE STATUS:     Code Status Orders        Start     Ordered   01/01/16 2230  Full code   Continuous     01/01/16 2229      TOTAL TIME TAKING CARE OF THIS PATIENT: 40 minutes.    Theodoro Grist M.D on 01/02/2016 at 2:49 PM  Between 7am to 6pm - Pager - [phone]  After 6pm go to [email or web address] - password [email or web address] [facility]  Office  [phone]  CC: Primary care physician; Juluis Pitch, MD

## 2016-01-04 DIAGNOSIS — E785 Hyperlipidemia, unspecified: Secondary | ICD-10-CM | POA: Diagnosis not present

## 2016-01-04 DIAGNOSIS — G453 Amaurosis fugax: Secondary | ICD-10-CM | POA: Diagnosis not present

## 2016-01-04 DIAGNOSIS — Z89612 Acquired absence of left leg above knee: Secondary | ICD-10-CM | POA: Diagnosis not present

## 2016-01-04 DIAGNOSIS — I10 Essential (primary) hypertension: Secondary | ICD-10-CM | POA: Diagnosis not present

## 2016-01-04 DIAGNOSIS — E119 Type 2 diabetes mellitus without complications: Secondary | ICD-10-CM | POA: Diagnosis not present

## 2016-01-04 DIAGNOSIS — J449 Chronic obstructive pulmonary disease, unspecified: Secondary | ICD-10-CM | POA: Diagnosis not present

## 2016-01-04 DIAGNOSIS — Z720 Tobacco use: Secondary | ICD-10-CM | POA: Diagnosis not present

## 2016-04-04 DIAGNOSIS — E538 Deficiency of other specified B group vitamins: Secondary | ICD-10-CM | POA: Diagnosis not present

## 2016-04-04 DIAGNOSIS — E119 Type 2 diabetes mellitus without complications: Secondary | ICD-10-CM | POA: Diagnosis not present

## 2016-04-04 DIAGNOSIS — I10 Essential (primary) hypertension: Secondary | ICD-10-CM | POA: Diagnosis not present

## 2016-04-04 DIAGNOSIS — E785 Hyperlipidemia, unspecified: Secondary | ICD-10-CM | POA: Diagnosis not present

## 2016-04-11 DIAGNOSIS — Z0000 Encounter for general adult medical examination without abnormal findings: Secondary | ICD-10-CM | POA: Diagnosis not present

## 2016-07-09 DIAGNOSIS — Z1159 Encounter for screening for other viral diseases: Secondary | ICD-10-CM | POA: Diagnosis not present

## 2016-07-09 DIAGNOSIS — G546 Phantom limb syndrome with pain: Secondary | ICD-10-CM | POA: Diagnosis not present

## 2016-07-09 DIAGNOSIS — Z720 Tobacco use: Secondary | ICD-10-CM | POA: Diagnosis not present

## 2016-07-09 DIAGNOSIS — E119 Type 2 diabetes mellitus without complications: Secondary | ICD-10-CM | POA: Diagnosis not present

## 2016-07-09 DIAGNOSIS — Z125 Encounter for screening for malignant neoplasm of prostate: Secondary | ICD-10-CM | POA: Diagnosis not present

## 2016-07-09 DIAGNOSIS — E785 Hyperlipidemia, unspecified: Secondary | ICD-10-CM | POA: Diagnosis not present

## 2016-07-09 DIAGNOSIS — E538 Deficiency of other specified B group vitamins: Secondary | ICD-10-CM | POA: Diagnosis not present

## 2016-07-09 DIAGNOSIS — Z0000 Encounter for general adult medical examination without abnormal findings: Secondary | ICD-10-CM | POA: Diagnosis not present

## 2016-07-13 DIAGNOSIS — Z1159 Encounter for screening for other viral diseases: Secondary | ICD-10-CM | POA: Diagnosis not present

## 2016-07-13 DIAGNOSIS — E538 Deficiency of other specified B group vitamins: Secondary | ICD-10-CM | POA: Diagnosis not present

## 2016-07-13 DIAGNOSIS — Z125 Encounter for screening for malignant neoplasm of prostate: Secondary | ICD-10-CM | POA: Diagnosis not present

## 2016-07-13 DIAGNOSIS — E119 Type 2 diabetes mellitus without complications: Secondary | ICD-10-CM | POA: Diagnosis not present

## 2016-07-13 DIAGNOSIS — E785 Hyperlipidemia, unspecified: Secondary | ICD-10-CM | POA: Diagnosis not present

## 2017-01-09 DIAGNOSIS — J449 Chronic obstructive pulmonary disease, unspecified: Secondary | ICD-10-CM | POA: Diagnosis not present

## 2017-01-09 DIAGNOSIS — E119 Type 2 diabetes mellitus without complications: Secondary | ICD-10-CM | POA: Diagnosis not present

## 2017-01-09 DIAGNOSIS — G546 Phantom limb syndrome with pain: Secondary | ICD-10-CM | POA: Diagnosis not present

## 2017-01-09 DIAGNOSIS — E785 Hyperlipidemia, unspecified: Secondary | ICD-10-CM | POA: Diagnosis not present

## 2017-01-09 DIAGNOSIS — I10 Essential (primary) hypertension: Secondary | ICD-10-CM | POA: Diagnosis not present

## 2017-01-09 DIAGNOSIS — E538 Deficiency of other specified B group vitamins: Secondary | ICD-10-CM | POA: Diagnosis not present

## 2017-01-09 DIAGNOSIS — R5383 Other fatigue: Secondary | ICD-10-CM | POA: Diagnosis not present

## 2017-02-07 ENCOUNTER — Encounter: Payer: Self-pay | Admitting: Emergency Medicine

## 2017-02-07 ENCOUNTER — Emergency Department
Admission: EM | Admit: 2017-02-07 | Discharge: 2017-02-08 | Disposition: A | Discharge status: Home / Self Care | Payer: Commercial Managed Care - HMO | Attending: Emergency Medicine | Admitting: Emergency Medicine

## 2017-02-07 DIAGNOSIS — F17210 Nicotine dependence, cigarettes, uncomplicated: Secondary | ICD-10-CM | POA: Diagnosis not present

## 2017-02-07 DIAGNOSIS — R55 Syncope and collapse: Secondary | ICD-10-CM | POA: Diagnosis not present

## 2017-02-07 DIAGNOSIS — F1210 Cannabis abuse, uncomplicated: Secondary | ICD-10-CM | POA: Insufficient documentation

## 2017-02-07 DIAGNOSIS — Z7984 Long term (current) use of oral hypoglycemic drugs: Secondary | ICD-10-CM | POA: Insufficient documentation

## 2017-02-07 DIAGNOSIS — Z79899 Other long term (current) drug therapy: Secondary | ICD-10-CM | POA: Insufficient documentation

## 2017-02-07 DIAGNOSIS — Z7982 Long term (current) use of aspirin: Secondary | ICD-10-CM | POA: Insufficient documentation

## 2017-02-07 DIAGNOSIS — I10 Essential (primary) hypertension: Secondary | ICD-10-CM | POA: Diagnosis not present

## 2017-02-07 DIAGNOSIS — R05 Cough: Secondary | ICD-10-CM

## 2017-02-07 DIAGNOSIS — R059 Cough, unspecified: Secondary | ICD-10-CM

## 2017-02-07 DIAGNOSIS — E119 Type 2 diabetes mellitus without complications: Secondary | ICD-10-CM | POA: Insufficient documentation

## 2017-02-07 DIAGNOSIS — R569 Unspecified convulsions: Secondary | ICD-10-CM | POA: Diagnosis not present

## 2017-02-07 LAB — BASIC METABOLIC PANEL
Anion gap: 9 (ref 5–15)
BUN: 11 mg/dL (ref 6–20)
CHLORIDE: 99 mmol/L — AB (ref 101–111)
CO2: 29 mmol/L (ref 22–32)
CREATININE: 0.87 mg/dL (ref 0.61–1.24)
Calcium: 9.2 mg/dL (ref 8.9–10.3)
GFR calc Af Amer: >60 mL/min (ref >60)
GFR calc non Af Amer: >60 mL/min (ref >60)
Glucose, Bld: 155 mg/dL — ABNORMAL HIGH (ref 65–99)
Potassium: 3.6 mmol/L (ref 3.5–5.1)
Sodium: 137 mmol/L (ref 135–145)

## 2017-02-07 LAB — CBC
HCT: 46.1 % (ref 40.0–52.0)
Hemoglobin: 16.3 g/dL (ref 13.0–18.0)
MCH: 31.6 pg (ref 26.0–34.0)
MCHC: 35.3 g/dL (ref 32.0–36.0)
MCV: 89.7 fL (ref 80.0–100.0)
PLATELETS: 171 10*3/uL (ref 150–440)
RBC: 5.15 MIL/uL (ref 4.40–5.90)
RDW: 14.7 % — AB (ref 11.5–14.5)
WBC: 6.5 10*3/uL (ref 3.8–10.6)

## 2017-02-07 NOTE — ED Triage Notes (Signed)
Patient's fiance states that patient appeared to have seizure. Fiance states that his whole body was shaking and that he was unresponsive at the time. Per fiance patient had some confusion after. Patient denies any history of seizures.

## 2017-02-08 ENCOUNTER — Emergency Department: Payer: Commercial Managed Care - HMO

## 2017-02-08 DIAGNOSIS — R569 Unspecified convulsions: Secondary | ICD-10-CM | POA: Diagnosis not present

## 2017-02-08 DIAGNOSIS — R05 Cough: Secondary | ICD-10-CM | POA: Diagnosis not present

## 2017-02-08 LAB — URINE DRUG SCREEN, QUALITATIVE (ARMC ONLY)
Amphetamines, Ur Screen: NOT DETECTED
BENZODIAZEPINE, UR SCRN: NOT DETECTED
Barbiturates, Ur Screen: NOT DETECTED
CANNABINOID 50 NG, UR ~~LOC~~: POSITIVE — AB
Cocaine Metabolite,Ur ~~LOC~~: NOT DETECTED
MDMA (Ecstasy)Ur Screen: NOT DETECTED
Methadone Scn, Ur: NOT DETECTED
Opiate, Ur Screen: NOT DETECTED
PHENCYCLIDINE (PCP) UR S: NOT DETECTED
Tricyclic, Ur Screen: NOT DETECTED

## 2017-02-08 LAB — POCT RAPID STREP A: Streptococcus, Group A Screen (Direct): NEGATIVE

## 2017-02-08 LAB — TROPONIN I

## 2017-02-08 MED ORDER — SODIUM CHLORIDE 0.9 % IV BOLUS (SEPSIS)
1000.0000 mL | Freq: Once | INTRAVENOUS | Status: AC
  Administered 2017-02-08: 1000 mL via INTRAVENOUS

## 2017-02-08 MED ORDER — HYDROCOD POLST-CPM POLST ER 10-8 MG/5ML PO SUER
5.0000 mL | Freq: Once | ORAL | Status: AC
  Administered 2017-02-08: 5 mL via ORAL
  Filled 2017-02-08: qty 5

## 2017-02-08 MED ORDER — HYDROCOD POLST-CPM POLST ER 10-8 MG/5ML PO SUER: 5.0000 mL | Freq: Two times a day (BID) | ORAL | 0 refills | Status: DC

## 2017-02-08 NOTE — ED Provider Notes (Signed)
[facility] Emergency Department Provider Note   ____________________________________________   First MD Initiated Contact with Patient 02/08/17 0032     (approximate)  I have reviewed the triage vital signs and the nursing notes.   HISTORY  Chief Complaint Seizures    HPI Tyler Gallagher is a 56 y.o. male who presents to the ED from homefor seizure-like activity. Patient has had a cough for the past 2 weeks; over the past 2 days he has developed sore throat and cough has become productive of white sputum. His son is sick with flulike symptoms. Patient reports sitting on his couch when he began to cough heavily. May have passed out afterwards for a few seconds. States when he came to he felt back to normal. Fiance witnessed the episode and was concerned that he was having seizure-like activity. EMS was called out to the house and told the patient he did not have a seizure. Patient denies recent fever, chills, chest pain, shortness of breath, abdominal pain, nausea, vomiting, diarrhea. Denies recent travel or trauma. Nothing made his symptoms better or worse. Denies alcohol or cocaine use. Reports having 3 puffs off a marijuana joint at approximately 11 PM.   Past Medical History:  Diagnosis Date  . Diabetes mellitus without complication ([place])   . Hyperlipidemia   . Hypertension     Patient Active Problem List   Diagnosis Date Noted  . Hyperlipidemia 01/02/2016  . Hypertriglyceridemia 01/02/2016  . Tobacco abuse 01/02/2016  . Type 2 diabetes mellitus without complication, without long-term current use of insulin ([place]) 01/02/2016  . Obesity 01/02/2016  . S/P AKA (above knee amputation) unilateral ([place]) 01/02/2016  . Amaurosis fugax 01/01/2016  . TIA (transient ischemic attack) 01/01/2016    Past Surgical History:  Procedure Laterality Date  . amputation Left 2005   AKA    Prior to Admission medications   Medication Sig Start Date End Date  Taking? Authorizing Provider  aspirin EC 325 MG tablet Take 1 tablet (325 mg total) by mouth daily. 01/02/16   Theodoro Grist, MD  atorvastatin (LIPITOR) 20 MG tablet Take 1 tablet (20 mg total) by mouth daily. Please watch for any muscle achiness, pain and stop Lopid and atorvastatin if these symptoms occur, thank you 01/02/16   Theodoro Grist, MD  bisoprolol-hydrochlorothiazide (ZIAC) 5-6.25 MG tablet Take 1 tablet by mouth daily. 09/08/15   Historical Provider, MD  Cyanocobalamin (RA VITAMIN B-12 TR) 1000 MCG TBCR Take 1 tablet by mouth daily.     Historical Provider, MD  gabapentin (NEURONTIN) 300 MG capsule Take 900 mg by mouth 3 (three) times daily.    Historical Provider, MD  gemfibrozil (LOPID) 600 MG tablet Take 0.5 tablets by mouth 2 (two) times daily. 01/07/15   Historical Provider, MD  metFORMIN (GLUCOPHAGE-XR) 750 MG 24 hr tablet Take 750 mg by mouth 2 (two) times daily.    Historical Provider, MD  pioglitazone (ACTOS) 30 MG tablet Take 30 mg by mouth daily.    Historical Provider, MD    Allergies Penicillins  Family History  Problem Relation Age of Onset  . Diabetes Sister   . Diabetes Brother     Social History Social History  Substance Use Topics  . Smoking status: Current Every Day Smoker    Packs/day: 2.00    Years: 32.00    Types: Cigarettes  . Smokeless tobacco: Never Used  . Alcohol use No    Review of Systems  Constitutional: No fever/chills. Eyes: No visual  changes. ENT: Positive for sore throat. Cardiovascular: Denies chest pain. Respiratory: Positive for productive cough. Denies shortness of breath. Gastrointestinal: No abdominal pain.  No nausea, no vomiting.  No diarrhea.  No constipation. Genitourinary: Negative for dysuria. Musculoskeletal: Negative for back pain. Skin: Negative for rash. Neurological: Positive for possible syncope, possible seizure like activity. Negative for headaches, focal weakness or numbness.  10-point ROS otherwise  negative.  ____________________________________________   PHYSICAL EXAM:  VITAL SIGNS: ED Triage Vitals  Enc Vitals Group     BP 02/07/17 2212 (!) 136/92     Pulse Rate 02/07/17 2212 (!) 110     Resp 02/07/17 2212 18     Temp 02/07/17 2212 98.3 F (36.8 C)     Temp Source 02/07/17 2212 Oral     SpO2 02/07/17 2212 96 %     Weight 02/07/17 2213 270 lb (122.5 kg)     Height 02/07/17 2213 '6\' 4"'$  (1.93 m)     Head Circumference --      Peak Flow --      Pain Score --      Pain Loc --      Pain Edu? --      Excl. in [place]? --     Constitutional: Alert and oriented. Well appearing and in no acute distress. Eyes: Conjunctivae are normal. PERRL. EOMI. Head: Atraumatic. Nose: No congestion/rhinnorhea. Mouth/Throat: Mucous membranes are moist.  Oropharynx erythematousWithout tonsillar swelling, exudates or peritonsillar abscess. There is no hoarse or muffled voice. There is no drooling. Neck: No stridor.  Supple neck without meningismus. Cardiovascular: Tachycardic rate, regular rhythm. Grossly normal heart sounds.  Good peripheral circulation. Respiratory: Normal respiratory effort.  No retractions. Lungs CTAB. Dry cough noted. Gastrointestinal: Soft and nontender. No distention. No abdominal bruits. No CVA tenderness. Musculoskeletal: No lower extremity tenderness nor edema.  No joint effusions. Left AKA. Neurologic:  Normal speech and language. No gross focal neurologic deficits are appreciated.  Skin:  Skin is warm, dry and intact. No rash noted. No petechiae. Psychiatric: Mood and affect are normal. Speech and behavior are normal.  ____________________________________________   LABS (all labs ordered are listed, but only abnormal results are displayed)  Labs Reviewed  BASIC METABOLIC PANEL - Abnormal; Notable for the following:       Result Value   Chloride 99 (*)    Glucose, Bld 155 (*)    All other components within normal limits  CBC - Abnormal; Notable for the following:     RDW 14.7 (*)    All other components within normal limits  URINE DRUG SCREEN, QUALITATIVE ([facility] ONLY) - Abnormal; Notable for the following:    Cannabinoid 50 Ng, Ur [place] POSITIVE (*)    All other components within normal limits  TROPONIN I  CBG MONITORING, ED  POCT RAPID STREP A   ____________________________________________  EKG  ED ECG REPORT I, SUNG,JADE J, the attending physician, personally viewed and interpreted this ECG.   Date: 02/08/2017  EKG Time: 0104  Rate: 105  Rhythm: sinus tachycardia  Axis: Normal  Intervals:none  ST&T Change: Nonspecific  ____________________________________________  RADIOLOGY  CT head interpreted per Dr. Francoise Ceo: No definite CT evidence for acute intracranial abnormality. Old  right posterior frontal infarct.    ____________________________________________   PROCEDURES  Procedure(s) performed: None  Procedures  Critical Care performed: No  ____________________________________________   INITIAL IMPRESSION / ASSESSMENT AND PLAN / ED COURSE  Pertinent labs & imaging results that were available during my care of the patient  were reviewed by me and considered in my medical decision making (see chart for details).  57 year old male who presents with possible seizure-like activity. Hearing his description, it seems more likely that patient had a near or syncopal episode after a fit of forceful coughing. Laboratory results so far are unremarkable. Will add troponin, chest x-ray, rapid strep, infuse IV fluids for mild tachycardia and reassess.  Clinical Course as of Feb 08 407  Fri Feb 08, 2017  3159 Patient sleeping. Heart rate 84. Room air saturation 96%. Updated patient and spouse on laboratory and imaging results. Strict return precautions given. Both verbalize understanding and agree with plan of care.  [JS]    Clinical Course User Index [JS] Paulette Blanch, MD     ____________________________________________   FINAL  CLINICAL IMPRESSION(S) / ED DIAGNOSES  Final diagnoses:  Marijuana abuse  Cough  Syncope, unspecified syncope type      NEW MEDICATIONS STARTED DURING THIS VISIT:  New Prescriptions   No medications on file     Note:  This document was prepared using Dragon voice recognition software and may include unintentional dictation errors.    Paulette Blanch, MD 02/08/17 [phone]

## 2017-02-08 NOTE — Discharge Instructions (Signed)
1. You may take cough medicine as needed (Tussionex). 2. Return to the ER for worsening symptoms, persistent vomiting, difficulty breathing or other concerns.

## 2017-05-07 DIAGNOSIS — R7309 Other abnormal glucose: Secondary | ICD-10-CM | POA: Diagnosis not present

## 2017-05-13 DIAGNOSIS — E785 Hyperlipidemia, unspecified: Secondary | ICD-10-CM | POA: Diagnosis not present

## 2017-05-13 DIAGNOSIS — E119 Type 2 diabetes mellitus without complications: Secondary | ICD-10-CM | POA: Diagnosis not present

## 2017-05-13 DIAGNOSIS — I10 Essential (primary) hypertension: Secondary | ICD-10-CM | POA: Diagnosis not present

## 2017-05-13 DIAGNOSIS — Z125 Encounter for screening for malignant neoplasm of prostate: Secondary | ICD-10-CM | POA: Diagnosis not present

## 2017-05-13 DIAGNOSIS — G546 Phantom limb syndrome with pain: Secondary | ICD-10-CM | POA: Diagnosis not present

## 2017-12-02 ENCOUNTER — Other Ambulatory Visit: Payer: Self-pay | Admitting: Pharmacy Technician

## 2017-12-02 NOTE — Patient Outreach (Signed)
[facility]) Care Management  12/02/2017  Tyler Gallagher Apr 30, 1961 [id number]  Successful outreach call to [facility] in regards to Atorvastatin 10mg  adherence. Technician stated that medication is currently ready to be picked up for a 30 day supply.  Tyler Gallagher, [place] Management [phone]

## 2018-08-18 DIAGNOSIS — N5089 Other specified disorders of the male genital organs: Secondary | ICD-10-CM | POA: Diagnosis not present

## 2018-09-28 ENCOUNTER — Other Ambulatory Visit: Payer: Self-pay

## 2018-09-28 ENCOUNTER — Emergency Department
Admission: EM | Admit: 2018-09-28 | Discharge: 2018-09-28 | Disposition: A | Discharge status: Home / Self Care | Payer: Medicare HMO | Attending: Emergency Medicine | Admitting: Emergency Medicine

## 2018-09-28 DIAGNOSIS — Z8673 Personal history of transient ischemic attack (TIA), and cerebral infarction without residual deficits: Secondary | ICD-10-CM | POA: Diagnosis not present

## 2018-09-28 DIAGNOSIS — E119 Type 2 diabetes mellitus without complications: Secondary | ICD-10-CM | POA: Diagnosis not present

## 2018-09-28 DIAGNOSIS — Z9889 Other specified postprocedural states: Secondary | ICD-10-CM

## 2018-09-28 DIAGNOSIS — L02214 Cutaneous abscess of groin: Secondary | ICD-10-CM | POA: Diagnosis not present

## 2018-09-28 DIAGNOSIS — F17210 Nicotine dependence, cigarettes, uncomplicated: Secondary | ICD-10-CM | POA: Diagnosis not present

## 2018-09-28 DIAGNOSIS — I10 Essential (primary) hypertension: Secondary | ICD-10-CM | POA: Diagnosis not present

## 2018-09-28 DIAGNOSIS — N454 Abscess of epididymis or testis: Secondary | ICD-10-CM | POA: Diagnosis not present

## 2018-09-28 DIAGNOSIS — L0291 Cutaneous abscess, unspecified: Secondary | ICD-10-CM

## 2018-09-28 LAB — GLUCOSE, CAPILLARY: GLUCOSE-CAPILLARY: 236 mg/dL — AB (ref 70–99)

## 2018-09-28 MED ORDER — LIDOCAINE-EPINEPHRINE 1 %-1:100000 IJ SOLN
30.0000 mL | Freq: Once | INTRAMUSCULAR | Status: AC
  Administered 2018-09-28: 30 mL via INTRADERMAL

## 2018-09-28 MED ORDER — LIDOCAINE-EPINEPHRINE (PF) 1 %-1:200000 IJ SOLN
30.0000 mL | Freq: Once | INTRAMUSCULAR | Status: DC
  Filled 2018-09-28: qty 30

## 2018-09-28 MED ORDER — LIDOCAINE-EPINEPHRINE 1 %-1:100000 IJ SOLN
INTRAMUSCULAR | Status: AC
  Administered 2018-09-28: 30 mL via INTRADERMAL
  Filled 2018-09-28: qty 1

## 2018-09-28 MED ORDER — MUPIROCIN 2 % EX OINT: TOPICAL_OINTMENT | CUTANEOUS | 0 refills | Status: AC

## 2018-09-28 MED ORDER — OXYCODONE-ACETAMINOPHEN 5-325 MG PO TABS
2.0000 | ORAL_TABLET | Freq: Once | ORAL | Status: AC
  Administered 2018-09-28: 2 via ORAL
  Filled 2018-09-28: qty 2

## 2018-09-28 NOTE — ED Notes (Signed)
No peripheral IV placed this visit.   Discharge instructions reviewed with patient. Questions fielded by this RN. Patient verbalizes understanding of instructions. Patient discharged home in stable condition per williams. No acute distress noted at time of discharge.

## 2018-09-28 NOTE — ED Provider Notes (Signed)
[facility] Emergency Department Provider Note       Time seen: ----------------------------------------- 7:51 PM on 09/28/2018 -----------------------------------------   I have reviewed the triage vital signs and the nursing notes.  HISTORY   Chief Complaint Groin Pain    HPI Tyler Gallagher is a 57 y.o. male with a history of diabetes, hyperlipidemia and hypertension who presents to the ED for a lump and swollen area near his right groin.  Patient states is been there about 3 days.  He was at a walk-in clinic a little while ago for a similar issue in his right buttocks region he was given antibiotics.  This seemed to resolve the area of swelling.  Patient reported itches and feels like there is fever in it.  He denies any chills or other complaints.  Past Medical History:  Diagnosis Date  . Diabetes mellitus without complication ([place])   . Hyperlipidemia   . Hypertension     Patient Active Problem List   Diagnosis Date Noted  . Hyperlipidemia 01/02/2016  . Hypertriglyceridemia 01/02/2016  . Tobacco abuse 01/02/2016  . Type 2 diabetes mellitus without complication, without long-term current use of insulin ([place]) 01/02/2016  . Obesity 01/02/2016  . S/P AKA (above knee amputation) unilateral ([place]) 01/02/2016  . Amaurosis fugax 01/01/2016  . TIA (transient ischemic attack) 01/01/2016    Past Surgical History:  Procedure Laterality Date  . amputation Left 2005   AKA    Allergies Penicillins  Social History Social History   Tobacco Use  . Smoking status: Current Every Day Smoker    Packs/day: 2.00    Years: 32.00    Pack years: 64.00    Types: Cigarettes  . Smokeless tobacco: Never Used  Substance Use Topics  . Alcohol use: No    Alcohol/week: 0.0 standard drinks  . Drug use: Yes    Types: Marijuana   Review of Systems Constitutional: Negative for fever. Cardiovascular: Negative for chest pain. Respiratory: Negative for shortness  of breath. Gastrointestinal: Negative for abdominal pain, vomiting and diarrhea. Genitourinary: Negative for testicular pain or swelling,  Msculoskeletal: Negative for back pain. Skin: Negative for rash. Neurological: Negative for headaches, focal weakness or numbness.  All systems negative/normal/unremarkable except as stated in the HPI  ____________________________________________   PHYSICAL EXAM:  VITAL SIGNS: ED Triage Vitals [09/28/18 1946]  Enc Vitals Group     BP      Pulse      Resp      Temp      Temp src      SpO2      Weight 250 lb (113.4 kg)     Height 6\' 3"  (1.905 m)     Head Circumference      Peak Flow      Pain Score 4     Pain Loc      Pain Edu?      Excl. in [place]?     Constitutional: Alert and oriented. Well appearing and in no distress. Genitourinary: No testicular tenderness, circumcised, right inguinal abscess with induration and erythema is noted Musculoskeletal: Nontender with normal range of motion in extremities. No lower extremity tenderness nor edema. Neurologic:  Normal speech and language. No gross focal neurologic deficits are appreciated.  Skin: Right inguinal abscess with induration and erythema Psychiatric: Mood and affect are normal. Speech and behavior are normal.  ____________________________________________  ED COURSE:  As part of my medical decision making, I reviewed the following data within the electronic medical  record:  History obtained from family if available, nursing notes, old chart and ekg, as well as notes from prior ED visits. Patient presented for right groin swelling, we will assess with labs and imaging as indicated at this time.   Marland Kitchen.Incision and Drainage Date/Time: 09/28/2018 8:51 PM Performed by: Earleen Newport, MD Authorized by: Earleen Newport, MD   Consent:    Consent obtained:  Verbal   Consent given by:  Patient   Risks discussed:  Bleeding, infection, incomplete drainage and pain   Alternatives  discussed:  Alternative treatment, delayed treatment and observation Location:    Type:  Abscess   Location:  Anogenital   Anogenital location:  Scrotal wall Pre-procedure details:    Skin preparation:  Betadine Anesthesia (see MAR for exact dosages):    Anesthesia method:  Local infiltration   Local anesthetic:  Lidocaine 1% WITH epi Procedure type:    Complexity:  Complex Procedure details:    Incision types:  Single straight   Incision depth:  Subcutaneous   Scalpel blade:  11   Wound management:  Probed and deloculated   Drainage:  Purulent   Drainage amount:  Moderate   Wound treatment:  Drain placed   Packing materials:  1/2 in gauze Post-procedure details:    Patient tolerance of procedure:  Tolerated well, no immediate complications   ____________________________________________   LABS (pertinent positives/negatives)  Labs Reviewed  GLUCOSE, CAPILLARY - Abnormal; Notable for the following components:      Result Value   Glucose-Capillary 236 (*)    All other components within normal limits  CBG MONITORING, ED   ____________________________________________  DIFFERENTIAL DIAGNOSIS   Abscess, cellulitis, adenopathy, hyperglycemia  FINAL ASSESSMENT AND PLAN  Right inguinal abscess, incision and drainage   Plan: The patient had presented for a right inguinal abscess. Patient's labs did reveal hyperglycemia which is likely the reason he keeps getting these infections.  Wound was incised and drained with packing placed as dictated above.  He is advised to remove the packing tomorrow.  He is cleared for outpatient follow-up.   Laurence Aly, MD   Note: This note was generated in part or whole with voice recognition software. Voice recognition is usually quite accurate but there are transcription errors that can and very often do occur. I apologize for any typographical errors that were not detected and corrected.     Earleen Newport, MD 09/28/18  2052

## 2018-09-28 NOTE — ED Notes (Signed)
Incision area bandaged and dressed

## 2018-09-28 NOTE — ED Notes (Signed)
Pt c/o painful cyst to right upper scrotum, palpable mass non-movable, pt reports hx of same

## 2018-09-28 NOTE — ED Triage Notes (Signed)
Pt reports he has a "lump" around his groin area. It has been there for about 3 days. Reports he was at a walk in clinic a little while ago for a similar issue in a different spot and was given antibiotics and it cleared up. Reports it itches and feels "like there's a fever in it". No fevers or chills. Reports fatigue.

## 2018-10-06 DIAGNOSIS — L02214 Cutaneous abscess of groin: Secondary | ICD-10-CM | POA: Diagnosis not present

## 2018-10-06 DIAGNOSIS — Z89612 Acquired absence of left leg above knee: Secondary | ICD-10-CM | POA: Diagnosis not present

## 2018-10-07 ENCOUNTER — Other Ambulatory Visit: Payer: Self-pay | Admitting: General Surgery

## 2018-10-07 DIAGNOSIS — N5089 Other specified disorders of the male genital organs: Secondary | ICD-10-CM | POA: Diagnosis not present

## 2018-10-14 ENCOUNTER — Ambulatory Visit
Admission: RE | Admit: 2018-10-14 | Discharge: 2018-10-14 | Disposition: A | Discharge status: Home / Self Care | Payer: Medicare HMO | Source: Ambulatory Visit | Attending: General Surgery | Admitting: General Surgery

## 2018-10-14 DIAGNOSIS — N5089 Other specified disorders of the male genital organs: Secondary | ICD-10-CM | POA: Diagnosis not present

## 2019-05-08 DIAGNOSIS — E785 Hyperlipidemia, unspecified: Secondary | ICD-10-CM | POA: Diagnosis not present

## 2019-05-08 DIAGNOSIS — I10 Essential (primary) hypertension: Secondary | ICD-10-CM | POA: Diagnosis not present

## 2019-05-08 DIAGNOSIS — E119 Type 2 diabetes mellitus without complications: Secondary | ICD-10-CM | POA: Diagnosis not present

## 2019-05-08 DIAGNOSIS — Z0000 Encounter for general adult medical examination without abnormal findings: Secondary | ICD-10-CM | POA: Diagnosis not present

## 2019-05-08 DIAGNOSIS — E538 Deficiency of other specified B group vitamins: Secondary | ICD-10-CM | POA: Diagnosis not present

## 2019-05-08 DIAGNOSIS — G546 Phantom limb syndrome with pain: Secondary | ICD-10-CM | POA: Diagnosis not present

## 2019-05-12 DIAGNOSIS — I10 Essential (primary) hypertension: Secondary | ICD-10-CM | POA: Diagnosis not present

## 2019-05-12 DIAGNOSIS — Z205 Contact with and (suspected) exposure to viral hepatitis: Secondary | ICD-10-CM | POA: Diagnosis not present

## 2019-05-12 DIAGNOSIS — E538 Deficiency of other specified B group vitamins: Secondary | ICD-10-CM | POA: Diagnosis not present

## 2019-05-12 DIAGNOSIS — E785 Hyperlipidemia, unspecified: Secondary | ICD-10-CM | POA: Diagnosis not present

## 2019-05-12 DIAGNOSIS — E119 Type 2 diabetes mellitus without complications: Secondary | ICD-10-CM | POA: Diagnosis not present

## 2019-05-12 DIAGNOSIS — Z125 Encounter for screening for malignant neoplasm of prostate: Secondary | ICD-10-CM | POA: Diagnosis not present

## 2019-07-01 DIAGNOSIS — E785 Hyperlipidemia, unspecified: Secondary | ICD-10-CM | POA: Diagnosis not present

## 2019-07-01 DIAGNOSIS — E119 Type 2 diabetes mellitus without complications: Secondary | ICD-10-CM | POA: Diagnosis not present

## 2019-07-01 DIAGNOSIS — I10 Essential (primary) hypertension: Secondary | ICD-10-CM | POA: Diagnosis not present

## 2019-07-01 DIAGNOSIS — Z87891 Personal history of nicotine dependence: Secondary | ICD-10-CM | POA: Diagnosis not present

## 2019-07-01 DIAGNOSIS — G546 Phantom limb syndrome with pain: Secondary | ICD-10-CM | POA: Diagnosis not present

## 2019-07-01 DIAGNOSIS — Z0000 Encounter for general adult medical examination without abnormal findings: Secondary | ICD-10-CM | POA: Diagnosis not present

## 2019-07-01 DIAGNOSIS — E538 Deficiency of other specified B group vitamins: Secondary | ICD-10-CM | POA: Diagnosis not present

## 2020-06-15 ENCOUNTER — Emergency Department
Admission: EM | Admit: 2020-06-15 | Discharge: 2020-06-15 | Disposition: A | Discharge status: Home / Self Care | Payer: Medicare HMO | Attending: Emergency Medicine | Admitting: Emergency Medicine

## 2020-06-15 ENCOUNTER — Encounter: Payer: Self-pay | Admitting: Emergency Medicine

## 2020-06-15 ENCOUNTER — Other Ambulatory Visit: Payer: Self-pay

## 2020-06-15 DIAGNOSIS — Z5321 Procedure and treatment not carried out due to patient leaving prior to being seen by health care provider: Secondary | ICD-10-CM | POA: Insufficient documentation

## 2020-06-15 DIAGNOSIS — F82 Specific developmental disorder of motor function: Secondary | ICD-10-CM | POA: Insufficient documentation

## 2020-06-15 DIAGNOSIS — M79601 Pain in right arm: Secondary | ICD-10-CM | POA: Diagnosis not present

## 2020-06-15 DIAGNOSIS — M79602 Pain in left arm: Secondary | ICD-10-CM | POA: Insufficient documentation

## 2020-06-15 DIAGNOSIS — R3915 Urgency of urination: Secondary | ICD-10-CM | POA: Diagnosis not present

## 2020-06-15 DIAGNOSIS — R531 Weakness: Secondary | ICD-10-CM | POA: Diagnosis not present

## 2020-06-15 LAB — CBC
HCT: 45.6 % (ref 39.0–52.0)
Hemoglobin: 16.5 g/dL (ref 13.0–17.0)
MCH: 31.4 pg (ref 26.0–34.0)
MCHC: 36.2 g/dL — ABNORMAL HIGH (ref 30.0–36.0)
MCV: 86.7 fL (ref 80.0–100.0)
Platelets: 245 10*3/uL (ref 150–400)
RBC: 5.26 MIL/uL (ref 4.22–5.81)
RDW: 13.6 % (ref 11.5–15.5)
WBC: 7.8 10*3/uL (ref 4.0–10.5)
nRBC: 0 % (ref 0.0–0.2)

## 2020-06-15 LAB — URINALYSIS, COMPLETE (UACMP) WITH MICROSCOPIC
Bacteria, UA: NONE SEEN
Bilirubin Urine: NEGATIVE
Glucose, UA: 150 mg/dL — AB
Hgb urine dipstick: NEGATIVE
Ketones, ur: NEGATIVE mg/dL
Leukocytes,Ua: NEGATIVE
Nitrite: NEGATIVE
Protein, ur: NEGATIVE mg/dL
Specific Gravity, Urine: 1.003 — ABNORMAL LOW (ref 1.005–1.030)
Squamous Epithelial / HPF: NONE SEEN (ref 0–5)
pH: 6 (ref 5.0–8.0)

## 2020-06-15 LAB — BASIC METABOLIC PANEL
Anion gap: 11 (ref 5–15)
BUN: 7 mg/dL (ref 6–20)
CO2: 31 mmol/L (ref 22–32)
Calcium: 9.8 mg/dL (ref 8.9–10.3)
Chloride: 94 mmol/L — ABNORMAL LOW (ref 98–111)
Creatinine, Ser: 0.8 mg/dL (ref 0.61–1.24)
GFR calc Af Amer: >60 mL/min (ref >60)
GFR calc non Af Amer: >60 mL/min (ref >60)
Glucose, Bld: 219 mg/dL — ABNORMAL HIGH (ref 70–99)
Potassium: 3.3 mmol/L — ABNORMAL LOW (ref 3.5–5.1)
Sodium: 136 mmol/L (ref 135–145)

## 2020-06-15 NOTE — ED Triage Notes (Signed)
Patient reports 3 weeks ago he was trying to crank a weedeater and the next day noticed his shoulders were sore. States since then he has felt the pain travel down his arms to his hands. States now he is having trouble with his fine motor skills. States he "doesn't feel right".   Patient also states urinary urgency for the past several days.

## 2020-06-15 NOTE — ED Notes (Signed)
Pt updated in the [place], VS reassessed.

## 2020-06-15 NOTE — ED Notes (Signed)
No answer when called several times from lobby 

## 2020-06-16 ENCOUNTER — Telehealth: Payer: Self-pay | Admitting: Emergency Medicine

## 2020-06-16 ENCOUNTER — Emergency Department
Admission: EM | Admit: 2020-06-16 | Discharge: 2020-06-16 | Discharge status: Against Medical Advice | Payer: Medicare HMO

## 2020-06-16 DIAGNOSIS — I10 Essential (primary) hypertension: Secondary | ICD-10-CM | POA: Diagnosis not present

## 2020-06-16 DIAGNOSIS — R918 Other nonspecific abnormal finding of lung field: Secondary | ICD-10-CM | POA: Diagnosis not present

## 2020-06-16 DIAGNOSIS — R110 Nausea: Secondary | ICD-10-CM | POA: Diagnosis not present

## 2020-06-16 DIAGNOSIS — R0609 Other forms of dyspnea: Secondary | ICD-10-CM | POA: Diagnosis not present

## 2020-06-16 DIAGNOSIS — R000 Tachycardia, unspecified: Secondary | ICD-10-CM | POA: Diagnosis not present

## 2020-06-16 DIAGNOSIS — J439 Emphysema, unspecified: Secondary | ICD-10-CM | POA: Diagnosis not present

## 2020-06-16 DIAGNOSIS — F17210 Nicotine dependence, cigarettes, uncomplicated: Secondary | ICD-10-CM | POA: Diagnosis not present

## 2020-06-16 DIAGNOSIS — E785 Hyperlipidemia, unspecified: Secondary | ICD-10-CM | POA: Diagnosis not present

## 2020-06-16 DIAGNOSIS — R531 Weakness: Secondary | ICD-10-CM | POA: Diagnosis not present

## 2020-06-16 DIAGNOSIS — E119 Type 2 diabetes mellitus without complications: Secondary | ICD-10-CM | POA: Diagnosis not present

## 2020-06-16 DIAGNOSIS — R0602 Shortness of breath: Secondary | ICD-10-CM | POA: Diagnosis not present

## 2020-06-16 DIAGNOSIS — I499 Cardiac arrhythmia, unspecified: Secondary | ICD-10-CM | POA: Diagnosis not present

## 2020-06-16 NOTE — ED Notes (Signed)
No answer when called several times from lobby 

## 2020-06-17 DIAGNOSIS — R918 Other nonspecific abnormal finding of lung field: Secondary | ICD-10-CM | POA: Diagnosis not present

## 2020-06-17 DIAGNOSIS — J439 Emphysema, unspecified: Secondary | ICD-10-CM | POA: Diagnosis not present

## 2020-06-20 ENCOUNTER — Encounter: Payer: Self-pay | Admitting: *Deleted

## 2020-06-20 ENCOUNTER — Encounter: Payer: Self-pay | Admitting: Oncology

## 2020-06-20 DIAGNOSIS — R918 Other nonspecific abnormal finding of lung field: Secondary | ICD-10-CM

## 2020-06-20 NOTE — Progress Notes (Signed)
  Oncology Nurse Navigator Documentation  Navigator Location: [facility]-Med Onc (06/20/20 1400) Referral Date to RadOnc/MedOnc: 06/20/20 (06/20/20 1400) )Navigator Encounter Type: Introductory Phone Call (06/20/20 1400)   Abnormal Finding Date: 06/17/20 (06/20/20 1400)                   Treatment Phase: Abnormal Scans (06/20/20 1400) Barriers/Navigation Needs: Coordination of Care (06/20/20 1400)   Interventions: Coordination of Care (06/20/20 1400)   Coordination of Care: Appts;Radiology (06/20/20 1400)       phone call made to patient to introduce to navigator services and review upcoming appts. Pt informed of new patient appt with [name] on Friday 6/25 at 9:15am. Contact info given and instructed to call with any further questions or needs. Pt verbalized understanding. Nothing further needed at this time.           Time Spent with Patient: 30 (06/20/20 1400)

## 2020-06-20 NOTE — Progress Notes (Signed)
I received a referral for this patient who was found to have right lung mass 3.8 cm on CTA done at [facility]. I will be seeing her later this week to discuss further management. She needs a PET /CT scan for further diagnosis and for staging/ biopsy  Dr. Randa Evens, MD, MPH [facility] [phone] 06/20/2020 10:03 PM

## 2020-06-21 ENCOUNTER — Inpatient Hospital Stay
Admission: EM | Admit: 2020-06-21 | Discharge: 2020-06-22 | DRG: 054 | Discharge status: Against Medical Advice | Payer: Medicare HMO | Attending: Internal Medicine | Admitting: Internal Medicine

## 2020-06-21 ENCOUNTER — Encounter: Payer: Self-pay | Admitting: Emergency Medicine

## 2020-06-21 ENCOUNTER — Ambulatory Visit
Admission: RE | Admit: 2020-06-21 | Discharge: 2020-06-21 | Disposition: A | Discharge status: Home / Self Care | Payer: Self-pay | Source: Ambulatory Visit | Attending: Oncology | Admitting: Oncology

## 2020-06-21 ENCOUNTER — Emergency Department: Payer: Medicare HMO

## 2020-06-21 ENCOUNTER — Other Ambulatory Visit: Payer: Self-pay | Admitting: Oncology

## 2020-06-21 ENCOUNTER — Other Ambulatory Visit: Payer: Self-pay

## 2020-06-21 DIAGNOSIS — Z20822 Contact with and (suspected) exposure to covid-19: Secondary | ICD-10-CM | POA: Diagnosis present

## 2020-06-21 DIAGNOSIS — G936 Cerebral edema: Secondary | ICD-10-CM | POA: Diagnosis present

## 2020-06-21 DIAGNOSIS — Z89612 Acquired absence of left leg above knee: Secondary | ICD-10-CM | POA: Diagnosis not present

## 2020-06-21 DIAGNOSIS — C679 Malignant neoplasm of bladder, unspecified: Secondary | ICD-10-CM | POA: Diagnosis present

## 2020-06-21 DIAGNOSIS — K5730 Diverticulosis of large intestine without perforation or abscess without bleeding: Secondary | ICD-10-CM | POA: Diagnosis not present

## 2020-06-21 DIAGNOSIS — E1140 Type 2 diabetes mellitus with diabetic neuropathy, unspecified: Secondary | ICD-10-CM | POA: Diagnosis not present

## 2020-06-21 DIAGNOSIS — I10 Essential (primary) hypertension: Secondary | ICD-10-CM | POA: Diagnosis not present

## 2020-06-21 DIAGNOSIS — Z880 Allergy status to penicillin: Secondary | ICD-10-CM | POA: Diagnosis not present

## 2020-06-21 DIAGNOSIS — Z7984 Long term (current) use of oral hypoglycemic drugs: Secondary | ICD-10-CM

## 2020-06-21 DIAGNOSIS — R590 Localized enlarged lymph nodes: Secondary | ICD-10-CM | POA: Diagnosis present

## 2020-06-21 DIAGNOSIS — R5381 Other malaise: Secondary | ICD-10-CM | POA: Diagnosis not present

## 2020-06-21 DIAGNOSIS — R9431 Abnormal electrocardiogram [ECG] [EKG]: Secondary | ICD-10-CM | POA: Diagnosis not present

## 2020-06-21 DIAGNOSIS — E785 Hyperlipidemia, unspecified: Secondary | ICD-10-CM | POA: Diagnosis not present

## 2020-06-21 DIAGNOSIS — Z79899 Other long term (current) drug therapy: Secondary | ICD-10-CM | POA: Diagnosis not present

## 2020-06-21 DIAGNOSIS — C3431 Malignant neoplasm of lower lobe, right bronchus or lung: Secondary | ICD-10-CM | POA: Diagnosis present

## 2020-06-21 DIAGNOSIS — Z833 Family history of diabetes mellitus: Secondary | ICD-10-CM

## 2020-06-21 DIAGNOSIS — R918 Other nonspecific abnormal finding of lung field: Secondary | ICD-10-CM

## 2020-06-21 DIAGNOSIS — K8020 Calculus of gallbladder without cholecystitis without obstruction: Secondary | ICD-10-CM | POA: Diagnosis not present

## 2020-06-21 DIAGNOSIS — Z993 Dependence on wheelchair: Secondary | ICD-10-CM | POA: Diagnosis not present

## 2020-06-21 DIAGNOSIS — C3490 Malignant neoplasm of unspecified part of unspecified bronchus or lung: Secondary | ICD-10-CM | POA: Diagnosis not present

## 2020-06-21 DIAGNOSIS — F17210 Nicotine dependence, cigarettes, uncomplicated: Secondary | ICD-10-CM | POA: Diagnosis not present

## 2020-06-21 DIAGNOSIS — Z5329 Procedure and treatment not carried out because of patient's decision for other reasons: Secondary | ICD-10-CM | POA: Diagnosis not present

## 2020-06-21 DIAGNOSIS — M4802 Spinal stenosis, cervical region: Secondary | ICD-10-CM | POA: Diagnosis not present

## 2020-06-21 DIAGNOSIS — C7931 Secondary malignant neoplasm of brain: Secondary | ICD-10-CM | POA: Diagnosis not present

## 2020-06-21 DIAGNOSIS — R531 Weakness: Secondary | ICD-10-CM | POA: Diagnosis not present

## 2020-06-21 DIAGNOSIS — N329 Bladder disorder, unspecified: Secondary | ICD-10-CM | POA: Diagnosis not present

## 2020-06-21 DIAGNOSIS — G939 Disorder of brain, unspecified: Secondary | ICD-10-CM | POA: Diagnosis not present

## 2020-06-21 DIAGNOSIS — Z87891 Personal history of nicotine dependence: Secondary | ICD-10-CM | POA: Diagnosis not present

## 2020-06-21 DIAGNOSIS — R29898 Other symptoms and signs involving the musculoskeletal system: Secondary | ICD-10-CM | POA: Diagnosis not present

## 2020-06-21 DIAGNOSIS — Z5181 Encounter for therapeutic drug level monitoring: Secondary | ICD-10-CM | POA: Diagnosis not present

## 2020-06-21 DIAGNOSIS — R000 Tachycardia, unspecified: Secondary | ICD-10-CM | POA: Diagnosis not present

## 2020-06-21 DIAGNOSIS — C801 Malignant (primary) neoplasm, unspecified: Secondary | ICD-10-CM | POA: Diagnosis not present

## 2020-06-21 LAB — DIFFERENTIAL
Abs Immature Granulocytes: 0.02 10*3/uL (ref 0.00–0.07)
Basophils Absolute: 0.1 10*3/uL (ref 0.0–0.1)
Basophils Relative: 1 %
Eosinophils Absolute: 0.2 10*3/uL (ref 0.0–0.5)
Eosinophils Relative: 3 %
Immature Granulocytes: 0 %
Lymphocytes Relative: 28 %
Lymphs Abs: 2.1 10*3/uL (ref 0.7–4.0)
Monocytes Absolute: 0.5 10*3/uL (ref 0.1–1.0)
Monocytes Relative: 6 %
Neutro Abs: 4.7 10*3/uL (ref 1.7–7.7)
Neutrophils Relative %: 62 %

## 2020-06-21 LAB — GLUCOSE, CAPILLARY
Glucose-Capillary: 223 mg/dL — ABNORMAL HIGH (ref 70–99)
Glucose-Capillary: 273 mg/dL — ABNORMAL HIGH (ref 70–99)

## 2020-06-21 LAB — CBC
HCT: 45.4 % (ref 39.0–52.0)
Hemoglobin: 15.9 g/dL (ref 13.0–17.0)
MCH: 30.7 pg (ref 26.0–34.0)
MCHC: 35 g/dL (ref 30.0–36.0)
MCV: 87.6 fL (ref 80.0–100.0)
Platelets: 229 10*3/uL (ref 150–400)
RBC: 5.18 MIL/uL (ref 4.22–5.81)
RDW: 13.4 % (ref 11.5–15.5)
WBC: 7.6 10*3/uL (ref 4.0–10.5)
nRBC: 0 % (ref 0.0–0.2)

## 2020-06-21 LAB — APTT: aPTT: 26 seconds (ref 24–36)

## 2020-06-21 LAB — TROPONIN I (HIGH SENSITIVITY)
Troponin I (High Sensitivity): 4 ng/L (ref <18)
Troponin I (High Sensitivity): 5 ng/L (ref <18)

## 2020-06-21 LAB — COMPREHENSIVE METABOLIC PANEL
ALT: 42 U/L (ref 0–44)
AST: 30 U/L (ref 15–41)
Albumin: 4.2 g/dL (ref 3.5–5.0)
Alkaline Phosphatase: 90 U/L (ref 38–126)
Anion gap: 12 (ref 5–15)
BUN: 9 mg/dL (ref 6–20)
CO2: 27 mmol/L (ref 22–32)
Calcium: 9.7 mg/dL (ref 8.9–10.3)
Chloride: 97 mmol/L — ABNORMAL LOW (ref 98–111)
Creatinine, Ser: 0.71 mg/dL (ref 0.61–1.24)
GFR calc Af Amer: >60 mL/min (ref >60)
GFR calc non Af Amer: >60 mL/min (ref >60)
Glucose, Bld: 207 mg/dL — ABNORMAL HIGH (ref 70–99)
Potassium: 3.5 mmol/L (ref 3.5–5.1)
Sodium: 136 mmol/L (ref 135–145)
Total Bilirubin: 0.7 mg/dL (ref 0.3–1.2)
Total Protein: 7.7 g/dL (ref 6.5–8.1)

## 2020-06-21 LAB — SARS CORONAVIRUS 2 BY RT PCR (HOSPITAL ORDER, PERFORMED IN ~~LOC~~ HOSPITAL LAB): SARS Coronavirus 2: NEGATIVE

## 2020-06-21 LAB — PROTIME-INR
INR: 1 (ref 0.8–1.2)
Prothrombin Time: 12.6 seconds (ref 11.4–15.2)

## 2020-06-21 MED ORDER — ONDANSETRON HCL 4 MG/2ML IJ SOLN: 4.0000 mg | Freq: Four times a day (QID) | INTRAMUSCULAR | Status: DC | PRN

## 2020-06-21 MED ORDER — ACETAMINOPHEN 650 MG RE SUPP: 650.0000 mg | Freq: Four times a day (QID) | RECTAL | Status: DC | PRN

## 2020-06-21 MED ORDER — POLYETHYLENE GLYCOL 3350 17 G PO PACK: 17.0000 g | PACK | Freq: Every day | ORAL | Status: DC | PRN

## 2020-06-21 MED ORDER — GADOBUTROL 1 MMOL/ML IV SOLN
10.0000 mL | Freq: Once | INTRAVENOUS | Status: AC | PRN
  Administered 2020-06-21: 10 mL via INTRAVENOUS

## 2020-06-21 MED ORDER — HYDROCHLOROTHIAZIDE 12.5 MG PO CAPS
12.5000 mg | ORAL_CAPSULE | Freq: Every day | ORAL | Status: DC
  Administered 2020-06-22: 12.5 mg via ORAL
  Filled 2020-06-21: qty 1

## 2020-06-21 MED ORDER — LISINOPRIL-HYDROCHLOROTHIAZIDE 10-12.5 MG PO TABS: 1.0000 | ORAL_TABLET | Freq: Every day | ORAL | Status: DC

## 2020-06-21 MED ORDER — GABAPENTIN 300 MG PO CAPS
900.0000 mg | ORAL_CAPSULE | Freq: Three times a day (TID) | ORAL | Status: DC
  Administered 2020-06-21: 900 mg via ORAL
  Filled 2020-06-21: qty 3

## 2020-06-21 MED ORDER — ACETAMINOPHEN 325 MG PO TABS: 650.0000 mg | ORAL_TABLET | Freq: Four times a day (QID) | ORAL | Status: DC | PRN

## 2020-06-21 MED ORDER — ATORVASTATIN CALCIUM 10 MG PO TABS
10.0000 mg | ORAL_TABLET | Freq: Every day | ORAL | Status: DC
  Administered 2020-06-21: 10 mg via ORAL
  Filled 2020-06-21 (×2): qty 1

## 2020-06-21 MED ORDER — LISINOPRIL 10 MG PO TABS
10.0000 mg | ORAL_TABLET | Freq: Every day | ORAL | Status: DC
  Administered 2020-06-22: 10 mg via ORAL
  Filled 2020-06-21: qty 1

## 2020-06-21 MED ORDER — DEXAMETHASONE SODIUM PHOSPHATE 10 MG/ML IJ SOLN
6.0000 mg | INTRAMUSCULAR | Status: DC
  Administered 2020-06-22: 6 mg via INTRAVENOUS
  Filled 2020-06-21: qty 1

## 2020-06-21 MED ORDER — INSULIN ASPART 100 UNIT/ML ~~LOC~~ SOLN
0.0000 [IU] | Freq: Three times a day (TID) | SUBCUTANEOUS | Status: DC
  Administered 2020-06-22: 4 [IU] via SUBCUTANEOUS
  Filled 2020-06-21: qty 1

## 2020-06-21 MED ORDER — NICOTINE 21 MG/24HR TD PT24
21.0000 mg | MEDICATED_PATCH | Freq: Every day | TRANSDERMAL | Status: DC
  Administered 2020-06-21: 21 mg via TRANSDERMAL
  Filled 2020-06-21: qty 1

## 2020-06-21 MED ORDER — SODIUM CHLORIDE 0.9% FLUSH
3.0000 mL | Freq: Two times a day (BID) | INTRAVENOUS | Status: DC
  Administered 2020-06-21 – 2020-06-22 (×2): 3 mL via INTRAVENOUS

## 2020-06-21 MED ORDER — INSULIN ASPART 100 UNIT/ML ~~LOC~~ SOLN
4.0000 [IU] | Freq: Three times a day (TID) | SUBCUTANEOUS | Status: DC
  Administered 2020-06-22: 4 [IU] via SUBCUTANEOUS
  Filled 2020-06-21: qty 1

## 2020-06-21 MED ORDER — ONDANSETRON HCL 4 MG PO TABS: 4.0000 mg | ORAL_TABLET | Freq: Four times a day (QID) | ORAL | Status: DC | PRN

## 2020-06-21 MED ORDER — INSULIN ASPART 100 UNIT/ML ~~LOC~~ SOLN
0.0000 [IU] | Freq: Every day | SUBCUTANEOUS | Status: DC
  Administered 2020-06-21: 3 [IU] via SUBCUTANEOUS
  Filled 2020-06-21: qty 1

## 2020-06-21 MED ORDER — ENOXAPARIN SODIUM 40 MG/0.4ML ~~LOC~~ SOLN
40.0000 mg | SUBCUTANEOUS | Status: DC
  Administered 2020-06-21: 40 mg via SUBCUTANEOUS
  Filled 2020-06-21: qty 0.4

## 2020-06-21 MED ORDER — IOHEXOL 300 MG/ML  SOLN
100.0000 mL | Freq: Once | INTRAMUSCULAR | Status: AC | PRN
  Administered 2020-06-21: 100 mL via INTRAVENOUS

## 2020-06-21 MED ORDER — DEXAMETHASONE SODIUM PHOSPHATE 10 MG/ML IJ SOLN
10.0000 mg | Freq: Once | INTRAMUSCULAR | Status: AC
  Administered 2020-06-21: 10 mg via INTRAVENOUS
  Filled 2020-06-21: qty 1

## 2020-06-21 NOTE — ED Notes (Signed)
[facility]  CALLED  PER  DR PADUCHOWSKI  MD

## 2020-06-21 NOTE — ED Provider Notes (Signed)
[facility] Emergency Department Provider Note  Time seen: 1:49 PM  I have reviewed the triage vital signs and the nursing notes.   HISTORY  Chief Complaint Weakness   HPI Tyler Gallagher is a 59 y.o. male with a past medical history of diabetes, hypertension, hyperlipidemia presents to the emergency department for generalized weakness worse ON the right upper and lower extremity.  According to the patient for the past for 5 days he has been experiencing fatigue and weakness especially weak in the right upper and lower extremity.  Patient states he would [facility] for the same 4 days ago and was diagnosed with a right-sided lung mass has follow-up with oncology later this week.   Denies any trouble speaking or thinking.  Denies any head injuries.  Past Medical History:  Diagnosis Date  . Diabetes mellitus without complication ([place])   . Hyperlipidemia   . Hypertension     Patient Active Problem List   Diagnosis Date Noted  . Hyperlipidemia 01/02/2016  . Hypertriglyceridemia 01/02/2016  . Tobacco abuse 01/02/2016  . Type 2 diabetes mellitus without complication, without long-term current use of insulin ([place]) 01/02/2016  . Obesity 01/02/2016  . S/P AKA (above knee amputation) unilateral ([place]) 01/02/2016  . Amaurosis fugax 01/01/2016  . TIA (transient ischemic attack) 01/01/2016    Past Surgical History:  Procedure Laterality Date  . amputation Left 2005   AKA    Prior to Admission medications   Medication Sig Start Date End Date Taking? Authorizing Provider  aspirin EC 325 MG tablet Take 1 tablet (325 mg total) by mouth daily. 01/02/16   Theodoro Grist, MD  atorvastatin (LIPITOR) 20 MG tablet Take 1 tablet (20 mg total) by mouth daily. Please watch for any muscle achiness, pain and stop Lopid and atorvastatin if these symptoms occur, thank you 01/02/16   Theodoro Grist, MD  bisoprolol-hydrochlorothiazide (ZIAC) 5-6.25 MG tablet Take 1 tablet by mouth daily.  09/08/15   [provider]  chlorpheniramine-HYDROcodone (TUSSIONEX PENNKINETIC ER) 10-8 MG/5ML SUER Take 5 mLs by mouth 2 (two) times daily. 02/08/17   Paulette Blanch, MD  Cyanocobalamin (RA VITAMIN B-12 TR) 1000 MCG TBCR Take 1 tablet by mouth daily.     [provider]  gabapentin (NEURONTIN) 300 MG capsule Take 900 mg by mouth 3 (three) times daily.    [provider]  gemfibrozil (LOPID) 600 MG tablet Take 0.5 tablets by mouth 2 (two) times daily. 01/07/15   [provider]  metFORMIN (GLUCOPHAGE-XR) 750 MG 24 hr tablet Take 750 mg by mouth 2 (two) times daily.    [provider]  pioglitazone (ACTOS) 30 MG tablet Take 30 mg by mouth daily.    [provider]    Allergies  Allergen Reactions  . Penicillins     Family History  Problem Relation Age of Onset  . Diabetes Sister   . Diabetes Brother     Social History Social History   Tobacco Use  . Smoking status: Current Every Day Smoker    Packs/day: 2.00    Years: 32.00    Pack years: 64.00    Types: Cigarettes  . Smokeless tobacco: Never Used  Substance Use Topics  . Alcohol use: No    Alcohol/week: 0.0 standard drinks  . Drug use: Yes    Types: Marijuana    Review of Systems Constitutional: Negative for fever. Cardiovascular: Negative for chest pain. Respiratory: Negative for shortness of breath. Gastrointestinal: Negative for abdominal pain Genitourinary:  Patient does state occasional urinary incontinence. Musculoskeletal: Negative for musculoskeletal complaints Neurological: Negative for headache All other ROS negative  ____________________________________________   PHYSICAL EXAM:  VITAL SIGNS: ED Triage Vitals  Enc Vitals Group     BP 06/21/20 1157 (!) 150/105     Pulse Rate 06/21/20 1157 92     Resp 06/21/20 1157 18     Temp 06/21/20 1157 98.5 F (36.9 C)     Temp Source 06/21/20 1157 Oral     SpO2 06/21/20 1157 96 %     Weight 06/21/20 1154 260 lb  (117.9 kg)     Height 06/21/20 1154 6\' 3"  (1.905 m)     Head Circumference --      Peak Flow --      Pain Score 06/21/20 1154 3     Pain Loc --      Pain Edu? --      Excl. in [place]? --    Constitutional: Patient is awake alert oriented, no acute distress. Eyes: Normal exam ENT      Head: Normocephalic and atraumatic.      Mouth/Throat: Mucous membranes are moist. Cardiovascular: Normal rate, regular rhythm.  Respiratory: Normal respiratory effort without tachypnea nor retractions. Breath sounds are clear Gastrointestinal: Soft and nontender. No distention.   Musculoskeletal: Status post left AKA.  No lower extremity edema. Neurologic:  Normal speech and language.  Patient unable to extend right upper extremity on examination.  Unable to hold against gravity for longer than 3 seconds.  Unable to hold right lower extremity up for longer than 3 or 4 seconds.  States sensation is intact and equal.  Cranial nerves intact Skin:  Skin is warm, dry and intact.  Psychiatric: Mood and affect are normal.   ____________________________________________    EKG  EKG viewed and interpreted by myself shows a normal sinus rhythm at 91 bpm with a narrow QRS, normal axis, normal intervals, nonspecific ST changes.  ____________________________________________    RADIOLOGY  CT scan of the head shows vasogenic edema in the left frontal lobe with a 1.6 cm lesion.  ____________________________________________   INITIAL IMPRESSION / ASSESSMENT AND PLAN / ED COURSE  Pertinent labs & imaging results that were available during my care of the patient were reviewed by me and considered in my medical decision making (see chart for details).   Patient presents to the emergency department for generalized fatigue, weakness especially of the right upper and lower extremity.  Patient has obvious deficit on neurologic exam in the right upper and lower extremity.  CT scan unfortunately shows vasogenic edema of the  1.6 cm lesion.  Given the patient recently diagnosed right middle lobe lung mass at [facility] 4 days ago this unfortunate likely represents a metastatic lesion.  We will proceed with an MRI with and without contrast to further evaluate the lesion.  We will discuss with neurosurgery and oncology once results are known.  We will dose 10 mg of IV Decadron while awaiting MRI results.  Patient's MRI unfortunately shows a left frontal lobe lesion.  I spoke to Dr. [name] of neurosurgery who initially recommended transfer to [facility] for further work-up and evaluation.  Spoke with [facility] they do not have any beds and will not have beds in the foreseeable future.  I spoke again to Dr. [name] who came and evaluated the patient in the emergency department.  He recommends local admission for lung mass biopsy.  I spoke to Dr. Rogue Bussing of oncology who agrees with  local admission for biopsy.  Dr. [name] will continue to follow along and consult on the patient while here and if needed we will arrange transfer in the future to [facility] for surgery.  Spoke to the hospitalist will be admitting the patient to their service.  Tyler Gallagher was evaluated in Emergency Department on 06/21/2020 for the symptoms described in the history of present illness. He was evaluated in the context of the global COVID-19 pandemic, which necessitated consideration that the patient might be at risk for infection with the SARS-CoV-2 virus that causes COVID-19. Institutional protocols and algorithms that pertain to the evaluation of patients at risk for COVID-19 are in a state of rapid change based on information released by regulatory bodies including the CDC and federal and state organizations. These policies and algorithms were followed during the patient's care in the ED.  ____________________________________________   FINAL CLINICAL IMPRESSION(S) / ED DIAGNOSES  Brain mass Lung mass   Harvest Dark, MD 06/21/20 1815

## 2020-06-21 NOTE — ED Notes (Signed)
Called for dinner tray

## 2020-06-21 NOTE — Plan of Care (Signed)

## 2020-06-21 NOTE — H&P (Addendum)
History and Physical    Tyler Gallagher GYJ:[id number] DOB: 04/21/1961 DOA: 06/21/2020  PCP: Juluis Pitch, MD   Patient coming from: Home  I have personally briefly reviewed patient's old medical records in [place]  Chief Complaint: Right arm weakness for the past 3 to 4 days.  HPI: Tyler Gallagher is a 59 y.o. male with medical history significant of diabetes, hypertension, left AKA and new diagnosis of lung mass came to ED with complaint of right upper extremity weakness going on for 3 to 4 days.  Recent ED visit with complaint of generalized weakness.  And CT chest with a right lung mass suspicious for malignancy.  Patient is a lifetime smoker. He made an outpatient appointment with [name] oncologist and supposed to see her later this week.  Outpatient PET scan was ordered.  For the past 3 to 4 days he was experiencing right upper extremity weakness and pain whenever trying to use his arm.  Denies any headache, nausea or vomiting.  Denies any change in his vision.  No chest pain or shortness of breath.  No other recent illnesses.  Denies any hematuria or urinary symptoms.  Recent change in his appetite or bowel habits.  Patient is a lifelong heavy smoker.  Smokes 2 to 3 packs/day.  ED Course: Hemodynamically stable.  Labs positive only for glucose of 207. CT head with some vasogenic edema in left frontal lobe, MRI brain with solitary metastatic lesion in left frontal lobe with surrounding vasogenic edema.  CT abdomen and pelvis with right lower lobe mass and some heterogenous area within bladder needs further evaluation. ED provider tried to transfer him to [facility] with no success. Oncology and neurosurgery was consulted.  Review of Systems: As per HPI otherwise 10 point review of systems negative.   Past Medical History:  Diagnosis Date  . Diabetes mellitus without complication ([place])   . Hyperlipidemia   . Hypertension     Past Surgical History:  Procedure  Laterality Date  . amputation Left 2005   AKA     reports that he has been smoking cigarettes. He has a 64.00 pack-year smoking history. He has never used smokeless tobacco. He reports current drug use. Drug: Marijuana. He reports that he does not drink alcohol.  Allergies  Allergen Reactions  . Penicillins     Family History  Problem Relation Age of Onset  . Diabetes Sister   . Diabetes Brother     Prior to Admission medications   Medication Sig Start Date End Date Taking? Authorizing Provider  atorvastatin (LIPITOR) 10 MG tablet Take 10 mg by mouth daily. 06/20/20  Yes [provider]  Cyanocobalamin (RA VITAMIN B-12 TR) 1000 MCG TBCR Take 1 tablet by mouth daily.    Yes [provider]  gabapentin (NEURONTIN) 300 MG capsule Take 900 mg by mouth 3 (three) times daily.   Yes [provider]  gemfibrozil (LOPID) 600 MG tablet Take 0.5 tablets by mouth 2 (two) times daily. 01/07/15  Yes [provider]  lisinopril-hydrochlorothiazide (ZESTORETIC) 10-12.5 MG tablet Take 1 tablet by mouth daily. 06/20/20  Yes [provider]  metFORMIN (GLUCOPHAGE-XR) 500 MG 24 hr tablet Take 500 mg by mouth 2 (two) times daily. 06/20/20  Yes [provider]  pioglitazone (ACTOS) 30 MG tablet Take 30 mg by mouth daily.   Yes [provider]  aspirin EC 325 MG tablet Take 1 tablet (325 mg total) by mouth daily. Patient not taking: Reported on  06/21/2020 01/02/16   Theodoro Grist, MD    Physical Exam: Vitals:   06/21/20 1157 06/21/20 1300 06/21/20 1408 06/21/20 1734  BP: (!) 150/105 122/84 (!) 142/92 121/74  Pulse: 92 79 87 95  Resp: 18 18 18    Temp: 98.5 F (36.9 C)     TempSrc: Oral     SpO2: 96% 98% 100% 95%  Weight:      Height:        General: Vital signs reviewed.  Patient is well-developed and well-nourished, in no acute distress and cooperative with exam.  Head: Normocephalic and atraumatic. Eyes: EOMI, conjunctivae normal, no  scleral icterus.  ENMT: Mucous membranes are moist.  Neck: Supple, trachea midline, normal ROM, no JVD, masses, thyromegaly, or carotid bruit present.  Cardiovascular: RRR, S1 normal, S2 normal, no murmurs, gallops, or rubs. Pulmonary/Chest: Clear to auscultation bilaterally, no wheezes, rales, or rhonchi. Abdominal: Soft, non-tender, non-distended, BS +,  Extremities: Left AKA, no lower extremity edema bilaterally,  pulses intact on right lower extremity. No cyanosis or clubbing. Neurological: A&O x3, 4/5 in right upper extremity and 5/5 in right lower and left upper extremities. cranial nerve II-XII are grossly intact, no focal motor deficit, sensory intact to light touch bilaterally.  Skin: Warm, dry and intact. No rashes or erythema. Psychiatric: Normal mood and affect. speech and behavior is normal. Cognition and memory are normal.   Labs on Admission: I have personally reviewed following labs and imaging studies  CBC: Recent Labs  Lab 06/15/20 1333 06/21/20 1159  WBC 7.8 7.6  NEUTROABS  --  4.7  HGB 16.5 15.9  HCT 45.6 45.4  MCV 86.7 87.6  PLT 245 811   Basic Metabolic Panel: Recent Labs  Lab 06/15/20 1333 06/21/20 1159  NA 136 136  K 3.3* 3.5  CL 94* 97*  CO2 31 27  GLUCOSE 219* 207*  BUN 7 9  CREATININE 0.80 0.71  CALCIUM 9.8 9.7   GFR: Estimated Creatinine Clearance: 141.1 mL/min (by C-G formula based on SCr of 0.71 mg/dL). Liver Function Tests: Recent Labs  Lab 06/21/20 1159  AST 30  ALT 42  ALKPHOS 90  BILITOT 0.7  PROT 7.7  ALBUMIN 4.2   No results for input(s): LIPASE, AMYLASE in the last 168 hours. No results for input(s): AMMONIA in the last 168 hours. Coagulation Profile: Recent Labs  Lab 06/21/20 1159  INR 1.0   Cardiac Enzymes: No results for input(s): CKTOTAL, CKMB, CKMBINDEX, TROPONINI in the last 168 hours. BNP (last 3 results) No results for input(s): PROBNP in the last 8760 hours. HbA1C: No results for input(s): HGBA1C in the  last 72 hours. CBG: Recent Labs  Lab 06/21/20 1155  GLUCAP 223*   Lipid Profile: No results for input(s): CHOL, HDL, LDLCALC, TRIG, CHOLHDL, LDLDIRECT in the last 72 hours. Thyroid Function Tests: No results for input(s): TSH, T4TOTAL, FREET4, T3FREE, THYROIDAB in the last 72 hours. Anemia Panel: No results for input(s): VITAMINB12, FOLATE, FERRITIN, TIBC, IRON, RETICCTPCT in the last 72 hours. Urine analysis:    Component Value Date/Time   COLORURINE STRAW (A) 06/15/2020 1354   APPEARANCEUR CLEAR (A) 06/15/2020 1354   LABSPEC 1.003 (L) 06/15/2020 1354   PHURINE 6.0 06/15/2020 1354   GLUCOSEU 150 (A) 06/15/2020 1354   HGBUR NEGATIVE 06/15/2020 [place] 06/15/2020 1354   KETONESUR NEGATIVE 06/15/2020 1354   PROTEINUR NEGATIVE 06/15/2020 1354   NITRITE NEGATIVE 06/15/2020 1354   LEUKOCYTESUR NEGATIVE 06/15/2020 1354    Radiological Exams on  Admission: CT HEAD WO CONTRAST  Result Date: 06/21/2020 CLINICAL DATA:  Right-sided weakness EXAM: CT HEAD WITHOUT CONTRAST TECHNIQUE: Contiguous axial images were obtained from the base of the skull through the vertex without intravenous contrast. COMPARISON:  2018 FINDINGS: Brain: There is no acute intracranial hemorrhage. Hypoattenuation is present in the high left frontal white matter reflecting vasogenic edema. There is mild regional mass effect. Probable underlying 1.3 x 0.6 x 1.6 cm lesion of the left superior frontal gyrus. Chronic right frontal infarct. Gray-white differentiation is otherwise preserved. There is no extra-axial fluid collection. Ventricles and sulci are within normal limits in size and configuration. Vascular: No hyperdense vessel or unexpected calcification. Skull: Calvarium is unremarkable. Sinuses/Orbits: No acute finding. Other: None. IMPRESSION: Vasogenic edema in the left frontal lobe associated with a 1.6 cm lesion. This likely reflects a metastasis given lung mass. Contrast enhanced MRI is  recommended to exclude additional lesions. No acute intracranial hemorrhage or significant mass effect. Electronically Signed   By: Macy Mis M.D.   On: 06/21/2020 13:09   MR Brain W and Wo Contrast  Result Date: 06/21/2020 CLINICAL DATA:  Brain mass.  Lung mass. EXAM: MRI HEAD WITHOUT AND WITH CONTRAST TECHNIQUE: Multiplanar, multiecho pulse sequences of the brain and surrounding structures were obtained without and with intravenous contrast. CONTRAST:  80mL GADAVIST GADOBUTROL 1 MMOL/ML IV SOLN COMPARISON:  CT head 06/21/2020 FINDINGS: Brain: Enhancing mass lesion left medial frontal cortex. The enhancing mass measures 19 x 16 mm and has central necrosis and moderate surrounding vasogenic edema in the white matter. No second mass lesion identified. Chronic infarct right frontal lobe.  No acute infarct or hemorrhage. Vascular: Normal arterial flow voids. Skull and upper cervical spine: No focal skeletal lesion. Sinuses/Orbits: Mild mucosal edema right maxillary sinus. 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 Chronic infarct right frontal lobe. Electronically Signed   By: Franchot Gallo M.D.   On: 06/21/2020 15:15   CT ABDOMEN PELVIS W CONTRAST  Result Date: 06/21/2020 CLINICAL DATA:  Right-sided weakness for 3-4 days. EXAM: CT ABDOMEN AND PELVIS WITH CONTRAST TECHNIQUE: Multidetector CT imaging of the abdomen and pelvis was performed using the standard protocol following bolus administration of intravenous contrast. CONTRAST:  120mL OMNIPAQUE IOHEXOL 300 MG/ML  SOLN COMPARISON:  None. FINDINGS: Lower chest: A 3.1 cm diameter round noncalcified soft tissue mass is seen within the posteromedial aspect of the right lower lobe. Hepatobiliary: There is diffuse fatty infiltration of the liver parenchyma. No focal liver abnormality is seen. A cluster of subcentimeter gallstones are seen within the gallbladder lumen.  There is no evidence of gallbladder wall thickening or biliary dilatation. Pancreas: Unremarkable. No pancreatic ductal dilatation or surrounding inflammatory changes. Spleen: Normal in size without focal abnormality. Adrenals/Urinary Tract: Adrenal glands are unremarkable. Kidneys are normal, without renal calculi or hydronephrosis. A 1.9 cm diameter cyst is seen adjacent to the lateral aspect of the mid left kidney. An 8.0 cm x 4.8 cm area of heterogeneous mildly increased attenuation is seen within the dependent portion of the urinary bladder. Stomach/Bowel: Stomach is within normal limits. Appendix appears normal. No evidence of bowel wall thickening, distention, or inflammatory changes. Noninflamed diverticula are seen within the proximal sigmoid colon. Vascular/Lymphatic: There is marked severity calcification of the abdominal aorta. No enlarged abdominal or pelvic lymph nodes. Reproductive: The prostate gland is mildly enlarged. Other: No abdominal wall hernia or abnormality. No abdominopelvic ascites. Musculoskeletal: Degenerative changes seen within  the lower lumbar spine at the level of L5-S1. IMPRESSION: 1. 3.1 cm diameter round noncalcified soft tissue mass within the posteromedial aspect of the right lower lobe. Further evaluation with chest CT is recommended, as an underlying neoplasm cannot be excluded. 2. Cholelithiasis. 3. Sigmoid diverticulosis. 4. Large area of heterogeneous increased attenuation within the lumen of the urinary bladder. While this may represent a large amount of blood products, the presence of a urinary bladder mass cannot be excluded. Aortic Atherosclerosis (ICD10-I70.0). Electronically Signed   By: Virgina Norfolk M.D.   On: 06/21/2020 16:42    EKG: Independently reviewed.  Sinus rhythm with incomplete right bundle branch block.  Assessment/Plan Active Problems:   Vasogenic brain edema (HCC)   Vasogenic brain edema with solitary lesion/lung mass with concern of  metastatic lung cancer/heterogeneous area in bladder.  Now with brain mets with vasogenic edema.  Patient needs higher level of care.  Unable to transfer to [facility]. Dr. [name] from neurosurgery saw him and they will follow along.  He was given a dose of Decadron. -Patient will have his biopsy done here which will be arranged by oncology for a tissue diagnosis. -Continue Decadron at 6 mg daily. -Frequent neurochecks  Type 2 diabetes with neuropathy.  Last A1c checked in May 2020 was 9.3. It was on Actos and Metformin at home. -Resistant scale SSI-as patient is being placed on Decadron. -Check A1c -Continue home dose of gabapentin  Hypertension. -Continue home dose of Zestoretic.  Dyslipidemia. -Continue home dose of  Lipitor.  Nicotine dependence. -Nicotine patch.  DVT prophylaxis: Lovenox Code Status: Full code Family Communication:Sister was updated at bedside. Disposition Plan: To be determined Consults called: Oncology, neurosurgery. Admission status: Inpatient   Lorella Nimrod MD [facility]  If 7PM-7AM, please contact night-coverage [email or web address]  06/21/2020, 6:34 PM   This record has been created using Dragon voice recognition software. Errors have been sought and corrected,but may not always be located. Such creation errors do not reflect on the standard of care.

## 2020-06-21 NOTE — ED Notes (Signed)
[phone]  

## 2020-06-21 NOTE — ED Triage Notes (Signed)
Came EMS for right side weakness for 3-4 days. CBG 213. VSS with EMS.  Pt was unable to sign EMS paper r/t weakness in right arm.  Has had difficulty wit right side for 3-4 days with weakness.  Speech clear.  Also c/o pain in left arm for couple days.  No fall or head injury. No blood thinners. Is diabetic. No hx stroke.

## 2020-06-21 NOTE — Progress Notes (Signed)
Pt received to room 260 PCU from ED.  Pt oriented to room and call bell.  See admission data base, assessment and vs's.  SR on the monitor.  Pt denies pain.

## 2020-06-21 NOTE — ED Notes (Signed)
Pt taken to floor with ED tech. On strethcer

## 2020-06-21 NOTE — ED Notes (Signed)
RN attempted to call report. Floor Nurse to call RN back

## 2020-06-22 DIAGNOSIS — Z79899 Other long term (current) drug therapy: Secondary | ICD-10-CM | POA: Diagnosis not present

## 2020-06-22 DIAGNOSIS — C3490 Malignant neoplasm of unspecified part of unspecified bronchus or lung: Secondary | ICD-10-CM | POA: Diagnosis not present

## 2020-06-22 DIAGNOSIS — C7931 Secondary malignant neoplasm of brain: Secondary | ICD-10-CM

## 2020-06-22 DIAGNOSIS — Z89612 Acquired absence of left leg above knee: Secondary | ICD-10-CM

## 2020-06-22 DIAGNOSIS — R29898 Other symptoms and signs involving the musculoskeletal system: Secondary | ICD-10-CM | POA: Diagnosis not present

## 2020-06-22 DIAGNOSIS — R9431 Abnormal electrocardiogram [ECG] [EKG]: Secondary | ICD-10-CM | POA: Diagnosis not present

## 2020-06-22 DIAGNOSIS — N329 Bladder disorder, unspecified: Secondary | ICD-10-CM

## 2020-06-22 DIAGNOSIS — M4802 Spinal stenosis, cervical region: Secondary | ICD-10-CM | POA: Diagnosis not present

## 2020-06-22 DIAGNOSIS — R918 Other nonspecific abnormal finding of lung field: Secondary | ICD-10-CM

## 2020-06-22 DIAGNOSIS — R000 Tachycardia, unspecified: Secondary | ICD-10-CM | POA: Diagnosis not present

## 2020-06-22 DIAGNOSIS — C801 Malignant (primary) neoplasm, unspecified: Secondary | ICD-10-CM

## 2020-06-22 DIAGNOSIS — Z87891 Personal history of nicotine dependence: Secondary | ICD-10-CM

## 2020-06-22 DIAGNOSIS — Z7984 Long term (current) use of oral hypoglycemic drugs: Secondary | ICD-10-CM | POA: Diagnosis not present

## 2020-06-22 DIAGNOSIS — C3431 Malignant neoplasm of lower lobe, right bronchus or lung: Secondary | ICD-10-CM | POA: Diagnosis not present

## 2020-06-22 DIAGNOSIS — Z5181 Encounter for therapeutic drug level monitoring: Secondary | ICD-10-CM | POA: Diagnosis not present

## 2020-06-22 LAB — GLUCOSE, CAPILLARY
Glucose-Capillary: 166 mg/dL — ABNORMAL HIGH (ref 70–99)
Glucose-Capillary: 217 mg/dL — ABNORMAL HIGH (ref 70–99)

## 2020-06-22 LAB — HIV ANTIBODY (ROUTINE TESTING W REFLEX): HIV Screen 4th Generation wRfx: NONREACTIVE

## 2020-06-22 MED ORDER — GABAPENTIN 300 MG PO CAPS
900.0000 mg | ORAL_CAPSULE | Freq: Three times a day (TID) | ORAL | Status: DC
  Administered 2020-06-22 (×2): 900 mg via ORAL
  Filled 2020-06-22 (×2): qty 3

## 2020-06-22 NOTE — Progress Notes (Signed)
Went in patients room to do bedside spirometry test. Patient refused to do the test. Patient stated that he wanted to be transferred to [facility].

## 2020-06-22 NOTE — Consult Note (Signed)
[facility] CONSULT NOTE  Patient Care Team: Juluis Pitch, MD as PCP - General (Family Medicine) Telford Nab, RN as Oncology Nurse Navigator  CHIEF COMPLAINTS/PURPOSE OF CONSULTATION: Brain metastases  HISTORY OF PRESENTING ILLNESS:  Tyler Gallagher 58 y.o.  male with longstanding history of smoking is currently admitted to hospital for right upper and lower extremity weakness.  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  Patient was recently seen in the emergency room at [facility] worsening shortness of breath-  CT June 18th--[facility] No pulmonary embolism; however showed large solid pulmonary mass within the right middle lobe with associated architectural distortion. Findings concerning for malignancy.   Patient was supposed to follow-up with oncology later this week-however presented to the hospital given worsening right-sided upper and lower extremity weakness.  Patient also noted to have increased incontinence of urine.  Denies any blood in urine.  Denies any abdominal pain.    Review of Systems  Constitutional: Positive for malaise/fatigue. Negative for chills, diaphoresis, fever and weight loss.  HENT: Negative for nosebleeds and sore throat.   Eyes: Negative for double vision.  Respiratory: Positive for cough and shortness of breath. Negative for hemoptysis, sputum production and wheezing.   Cardiovascular: Negative for chest pain, palpitations, orthopnea and leg swelling.  Gastrointestinal: Negative for abdominal pain, blood in stool, constipation, diarrhea, heartburn, melena, nausea and vomiting.  Genitourinary: Negative for dysuria, frequency and urgency.  Musculoskeletal: Positive for back pain and joint pain.  Skin: Negative.  Negative for itching and rash.  Neurological: Positive for dizziness, focal weakness and weakness. Negative for tingling and headaches.  Endo/Heme/Allergies:  Does not bruise/bleed easily.  Psychiatric/Behavioral: Negative for depression. The patient is not nervous/anxious and does not have insomnia.      MEDICAL HISTORY:  Past Medical History:  Diagnosis Date  . Diabetes mellitus without complication ([place])   . Hyperlipidemia   . Hypertension     SURGICAL HISTORY: Past Surgical History:  Procedure Laterality Date  . amputation Left 2005   AKA    SOCIAL HISTORY: Social History   Socioeconomic History  . Marital status: Divorced    Spouse name: Not on file  . Number of children: Not on file  . Years of education: Not on file  . Highest education level: Not on file  Occupational History  . Not on file  Tobacco Use  . Smoking status: Current Every Day Smoker    Packs/day: 2.00    Years: 32.00    Pack years: 64.00    Types: Cigarettes  . Smokeless tobacco: Never Used  Substance and Sexual Activity  . Alcohol use: No    Alcohol/week: 0.0 standard drinks  . Drug use: Yes    Types: Marijuana  . Sexual activity: Not on file  Other Topics Concern  . Not on file  Social History Narrative  . Not on file   Social Determinants of Health   Financial Resource Strain:   . Difficulty of Paying Living Expenses:   Food Insecurity:   . Worried About Charity fundraiser in the Last Year:   . Arboriculturist in the Last Year:   Transportation Needs:   . Film/video editor (Medical):   Marland Kitchen Lack of Transportation (Non-Medical):   Physical Activity:   . Days of Exercise per Week:   . Minutes of Exercise per Session:   Stress:   . Feeling of Stress :   Social Connections:   .  Frequency of Communication with Friends and Family:   . Frequency of Social Gatherings with Friends and Family:   . Attends Religious Services:   . Active Member of Clubs or Organizations:   . Attends Archivist Meetings:   Marland Kitchen Marital Status:   Intimate Partner Violence:   . Fear of Current or Ex-Partner:   . Emotionally Abused:   Marland Kitchen Physically  Abused:   . Sexually Abused:     FAMILY HISTORY: Family History  Problem Relation Age of Onset  . Diabetes Sister   . Diabetes Brother     ALLERGIES:  is allergic to penicillins.  MEDICATIONS:  Current Facility-Administered Medications  Medication Dose Route Frequency Provider Last Rate Last Admin  . acetaminophen (TYLENOL) tablet 650 mg  650 mg Oral Q6H PRN Lorella Nimrod, MD       Or  . acetaminophen (TYLENOL) suppository 650 mg  650 mg Rectal Q6H PRN Lorella Nimrod, MD      . atorvastatin (LIPITOR) tablet 10 mg  10 mg Oral Daily Lorella Nimrod, MD   10 mg at 06/21/20 2324  . dexamethasone (DECADRON) injection 6 mg  6 mg Intravenous Q24H Lorella Nimrod, MD   6 mg at 06/22/20 1010  . gabapentin (NEURONTIN) capsule 900 mg  900 mg Oral TID Lorella Nimrod, MD   900 mg at 06/22/20 1017  . lisinopril (ZESTRIL) tablet 10 mg  10 mg Oral Daily Lorella Nimrod, MD   10 mg at 06/22/20 1010   And  . hydrochlorothiazide (MICROZIDE) capsule 12.5 mg  12.5 mg Oral Daily Lorella Nimrod, MD   12.5 mg at 06/22/20 1010  . insulin aspart (novoLOG) injection 0-20 Units  0-20 Units Subcutaneous TID WC Lorella Nimrod, MD   4 Units at 06/22/20 1012  . insulin aspart (novoLOG) injection 0-5 Units  0-5 Units Subcutaneous QHS Lorella Nimrod, MD   3 Units at 06/21/20 2122  . insulin aspart (novoLOG) injection 4 Units  4 Units Subcutaneous TID WC Lorella Nimrod, MD   4 Units at 06/22/20 1012  . nicotine (NICODERM CQ - dosed in mg/24 hours) patch 21 mg  21 mg Transdermal Daily Lorella Nimrod, MD   21 mg at 06/21/20 2023  . ondansetron (ZOFRAN) tablet 4 mg  4 mg Oral Q6H PRN Lorella Nimrod, MD       Or  . ondansetron (ZOFRAN) injection 4 mg  4 mg Intravenous Q6H PRN Lorella Nimrod, MD      . polyethylene glycol (MIRALAX / GLYCOLAX) packet 17 g  17 g Oral Daily PRN Lorella Nimrod, MD      . sodium chloride flush (NS) 0.9 % injection 3 mL  3 mL Intravenous Q12H Lorella Nimrod, MD   3 mL at 06/22/20 1013   Current Outpatient  Medications  Medication Sig Dispense Refill  . atorvastatin (LIPITOR) 10 MG tablet Take 10 mg by mouth daily.    . Cyanocobalamin (RA VITAMIN B-12 TR) 1000 MCG TBCR Take 1 tablet by mouth daily.     Marland Kitchen gabapentin (NEURONTIN) 300 MG capsule Take 900 mg by mouth 3 (three) times daily.    Marland Kitchen gemfibrozil (LOPID) 600 MG tablet Take 0.5 tablets by mouth 2 (two) times daily.    Marland Kitchen lisinopril-hydrochlorothiazide (ZESTORETIC) 10-12.5 MG tablet Take 1 tablet by mouth daily.    . metFORMIN (GLUCOPHAGE-XR) 500 MG 24 hr tablet Take 500 mg by mouth 2 (two) times daily.    . pioglitazone (ACTOS) 30 MG tablet Take 30 mg by mouth daily.    Marland Kitchen  aspirin EC 325 MG tablet Take 1 tablet (325 mg total) by mouth daily. (Patient not taking: Reported on 06/21/2020) 30 tablet 0      .  PHYSICAL EXAMINATION:  Vitals:   06/22/20 0830 06/22/20 1132  BP: (!) 143/96 138/82  Pulse: 89 92  Resp: 18 18  Temp: 98.4 F (36.9 C)   SpO2: 97% 94%   Filed Weights   06/21/20 1154  Weight: 260 lb (117.9 kg)    Physical Exam HENT:     Head: Normocephalic and atraumatic.     Mouth/Throat:     Pharynx: No oropharyngeal exudate.  Eyes:     Pupils: Pupils are equal, round, and reactive to light.  Cardiovascular:     Rate and Rhythm: Normal rate and regular rhythm.  Pulmonary:     Effort: No respiratory distress.     Breath sounds: No wheezing.  Abdominal:     General: Bowel sounds are normal. There is no distension.     Palpations: Abdomen is soft. There is no mass.     Tenderness: There is no abdominal tenderness. There is no guarding or rebound.  Musculoskeletal:        General: No tenderness. Normal range of motion.     Cervical back: Normal range of motion and neck supple.     Comments: Amputation left side- Above the knee  Skin:    General: Skin is warm.  Neurological:     Mental Status: He is alert and oriented to person, place, and time.     Comments: Right upper and lower extremity weakness 4-5.   Psychiatric:        Mood and Affect: Affect normal.      LABORATORY DATA:  I have reviewed the data as listed Lab Results  Component Value Date   WBC 7.6 06/21/2020   HGB 15.9 06/21/2020   HCT 45.4 06/21/2020   MCV 87.6 06/21/2020   PLT 229 06/21/2020   Recent Labs    06/15/20 1333 06/21/20 1159  NA 136 136  K 3.3* 3.5  CL 94* 97*  CO2 31 27  GLUCOSE 219* 207*  BUN 7 9  CREATININE 0.80 0.71  CALCIUM 9.8 9.7  GFRNONAA >60 >60  GFRAA >60 >60  PROT  --  7.7  ALBUMIN  --  4.2  AST  --  30  ALT  --  42  ALKPHOS  --  90  BILITOT  --  0.7    RADIOGRAPHIC STUDIES: I have personally reviewed the radiological images as listed and agreed with the findings in the report. CT HEAD WO CONTRAST  Result Date: 06/21/2020 CLINICAL DATA:  Right-sided weakness EXAM: CT HEAD WITHOUT CONTRAST TECHNIQUE: Contiguous axial images were obtained from the base of the skull through the vertex without intravenous contrast. COMPARISON:  2018 FINDINGS: Brain: There is no acute intracranial hemorrhage. Hypoattenuation is present in the high left frontal white matter reflecting vasogenic edema. There is mild regional mass effect. Probable underlying 1.3 x 0.6 x 1.6 cm lesion of the left superior frontal gyrus. Chronic right frontal infarct. Gray-white differentiation is otherwise preserved. There is no extra-axial fluid collection. Ventricles and sulci are within normal limits in size and configuration. Vascular: No hyperdense vessel or unexpected calcification. Skull: Calvarium is unremarkable. Sinuses/Orbits: No acute finding. Other: None. IMPRESSION: Vasogenic edema in the left frontal lobe associated with a 1.6 cm lesion. This likely reflects a metastasis given lung mass. Contrast enhanced MRI is recommended to exclude additional lesions. No  acute intracranial hemorrhage or significant mass effect. Electronically Signed   By: Macy Mis M.D.   On: 06/21/2020 13:09   MR Brain W and Wo  Contrast  Result Date: 06/21/2020 CLINICAL DATA:  Brain mass.  Lung mass. EXAM: MRI HEAD WITHOUT AND WITH CONTRAST TECHNIQUE: Multiplanar, multiecho pulse sequences of the brain and surrounding structures were obtained without and with intravenous contrast. CONTRAST:  62mL GADAVIST GADOBUTROL 1 MMOL/ML IV SOLN COMPARISON:  CT head 06/21/2020 FINDINGS: Brain: Enhancing mass lesion left medial frontal cortex. The enhancing mass measures 19 x 16 mm and has central necrosis and moderate surrounding vasogenic edema in the white matter. No second mass lesion identified. Chronic infarct right frontal lobe.  No acute infarct or hemorrhage. Vascular: Normal arterial flow voids. Skull and upper cervical spine: No focal skeletal lesion. Sinuses/Orbits: Mild mucosal edema right maxillary sinus. 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 Chronic infarct right frontal lobe. Electronically Signed   By: Franchot Gallo M.D.   On: 06/21/2020 15:15   CT ABDOMEN PELVIS W CONTRAST  Result Date: 06/21/2020 CLINICAL DATA:  Right-sided weakness for 3-4 days. EXAM: CT ABDOMEN AND PELVIS WITH CONTRAST TECHNIQUE: Multidetector CT imaging of the abdomen and pelvis was performed using the standard protocol following bolus administration of intravenous contrast. CONTRAST:  179mL OMNIPAQUE IOHEXOL 300 MG/ML  SOLN COMPARISON:  None. FINDINGS: Lower chest: A 3.1 cm diameter round noncalcified soft tissue mass is seen within the posteromedial aspect of the right lower lobe. Hepatobiliary: There is diffuse fatty infiltration of the liver parenchyma. No focal liver abnormality is seen. A cluster of subcentimeter gallstones are seen within the gallbladder lumen. There is no evidence of gallbladder wall thickening or biliary dilatation. Pancreas: Unremarkable. No pancreatic ductal dilatation or surrounding inflammatory changes. Spleen: Normal in size without  focal abnormality. Adrenals/Urinary Tract: Adrenal glands are unremarkable. Kidneys are normal, without renal calculi or hydronephrosis. A 1.9 cm diameter cyst is seen adjacent to the lateral aspect of the mid left kidney. An 8.0 cm x 4.8 cm area of heterogeneous mildly increased attenuation is seen within the dependent portion of the urinary bladder. Stomach/Bowel: Stomach is within normal limits. Appendix appears normal. No evidence of bowel wall thickening, distention, or inflammatory changes. Noninflamed diverticula are seen within the proximal sigmoid colon. Vascular/Lymphatic: There is marked severity calcification of the abdominal aorta. No enlarged abdominal or pelvic lymph nodes. Reproductive: The prostate gland is mildly enlarged. Other: No abdominal wall hernia or abnormality. No abdominopelvic ascites. Musculoskeletal: Degenerative changes seen within the lower lumbar spine at the level of L5-S1. IMPRESSION: 1. 3.1 cm diameter round noncalcified soft tissue mass within the posteromedial aspect of the right lower lobe. Further evaluation with chest CT is recommended, as an underlying neoplasm cannot be excluded. 2. Cholelithiasis. 3. Sigmoid diverticulosis. 4. Large area of heterogeneous increased attenuation within the lumen of the urinary bladder. While this may represent a large amount of blood products, the presence of a urinary bladder mass cannot be excluded. Aortic Atherosclerosis (ICD10-I70.0). Electronically Signed   By: Virgina Norfolk M.D.   On: 06/21/2020 16:42   CT OUTSIDE FILMS CHEST  Result Date: 06/22/2020 This examination belongs to an outside facility and is stored here for comparison purposes only.  Contact the originating outside institution for any associated report or interpretation.  DG Outside Films Chest  Result Date: 06/22/2020 This examination belongs to an outside facility and is stored here for comparison  purposes only.  Contact the originating outside institution  for any associated report or interpretation.   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 primary unclear -likely lung less likely bladder-currently on dexamethasone; s/p evaluation neurosurgery  #Right lower lobe lung mass-likely primary rather than metastases  #CT scan heterogenous content noted in the bladder-concerning for malignancy  # S/p left above-knee amputation  # Active smoker  Recommendation:  # Given the clinical presentation-it is quite possible the patient has 2 primaries-lung leading to brain metastases; and bladder cancer based on imaging.  Discussed with IR-would recommend bronchoscopic evaluation/biopsy of the right lower lobe lung mass.  With regards to bladder mass-highly concerning for malignancy.  Discussed with Dr. Erlene Quan, as patient is currently asymptomatic-recommend prioritization of the lung/brain issues first.  Patient will eventually need cystoscopy biopsy.   #With regards to brain metastases-continue steroids.  Definitive options include-surgery versus stereotactic radiation.  Await above work-up  Thank you Dr.Amin for allowing me to participate in the care of your pleasant patient. Please do not hesitate to contact me with questions or concerns in the interim.  Addendum: Patient left AMA.   # I reviewed the blood work- with the patient in detail; also reviewed the imaging independently [as summarized above]; and with the patient in detail.   All questions were answered. The patient knows to call the clinic with any problems, questions or concerns.    Cammie Sickle, MD 06/22/2020 3:52 PM

## 2020-06-22 NOTE — Assessment & Plan Note (Signed)
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 primary unclear -likely lung less likely bladder-currently on dexamethasone; s/p evaluation neurosurgery  #Right lower lobe lung mass-likely primary rather than metastases  #CT scan heterogenous content noted in the bladder-concerning for malignancy  # S/p left above-knee amputation  # Active smoker  Recommendation:  # Given the clinical presentation-it is quite possible the patient has 2 primaries-lung leading to brain metastases; and bladder cancer based on imaging.  Discussed with IR-would recommend bronchoscopic evaluation/biopsy of the right lower lobe lung mass.  With regards to bladder mass-highly concerning for malignancy.  Discussed with Dr. Erlene Quan, as patient is currently asymptomatic-recommend prioritization of the lung/brain issues first.  Patient will eventually need cystoscopy biopsy.   #With regards to brain metastases-continue steroids.  Definitive options include-surgery versus stereotactic radiation.  Await above work-up  Thank you Dr.Amin for allowing me to participate in the care of your pleasant patient. Please do not hesitate to contact me with questions or concerns in the interim.  Addendum: Patient left AMA.

## 2020-06-22 NOTE — Consult Note (Signed)
Referring Physician:  No referring provider defined for this encounter.  Primary Physician:  Juluis Pitch, MD  Chief Complaint:  Brain lesion  History of Present Illness: Tyler Gallagher is a 59 y.o. male who presents to the [facility] ED after experiencing generalized weakness, most pronounced on the Right upper and lower extremities. He said that for the last 5 days/week he has been experiencing this weakness. Per chart review, he was recently seen at the [facility] and diagnosed with a R sided lung mass. He was discharged to follow up with outpatient oncology.   On exam, his cranial nerves are intact but he does demonstrate RUE and RLE weakness.    Review of Systems:  A 10 point review of systems is negative, except for the pertinent positives and negatives detailed in the HPI.  Past Medical History: Past Medical History:  Diagnosis Date  . Diabetes mellitus without complication ([place])   . Hyperlipidemia   . Hypertension     Past Surgical History: Past Surgical History:  Procedure Laterality Date  . amputation Left 2005   AKA    Allergies: Allergies as of 06/21/2020 - Review Complete 06/21/2020  Allergen Reaction Noted  . Penicillins  02/07/2017    Medications:  Current Facility-Administered Medications:  .  acetaminophen (TYLENOL) tablet 650 mg, 650 mg, Oral, Q6H PRN **OR** acetaminophen (TYLENOL) suppository 650 mg, 650 mg, Rectal, Q6H PRN, Lorella Nimrod, MD .  atorvastatin (LIPITOR) tablet 10 mg, 10 mg, Oral, Daily, Lorella Nimrod, MD, 10 mg at 06/21/20 2324 .  dexamethasone (DECADRON) injection 6 mg, 6 mg, Intravenous, Q24H, Amin, Sumayya, MD, 6 mg at 06/22/20 1010 .  gabapentin (NEURONTIN) capsule 900 mg, 900 mg, Oral, TID, Lorella Nimrod, MD, 900 mg at 06/22/20 1017 .  lisinopril (ZESTRIL) tablet 10 mg, 10 mg, Oral, Daily, 10 mg at 06/22/20 1010 **AND** hydrochlorothiazide (MICROZIDE) capsule 12.5 mg, 12.5 mg, Oral, Daily, Amin, Soundra Pilon, MD, 12.5 mg at 06/22/20  1010 .  insulin aspart (novoLOG) injection 0-20 Units, 0-20 Units, Subcutaneous, TID WC, Lorella Nimrod, MD, 4 Units at 06/22/20 1012 .  insulin aspart (novoLOG) injection 0-5 Units, 0-5 Units, Subcutaneous, QHS, Lorella Nimrod, MD, 3 Units at 06/21/20 2122 .  insulin aspart (novoLOG) injection 4 Units, 4 Units, Subcutaneous, TID WC, Lorella Nimrod, MD, 4 Units at 06/22/20 1012 .  nicotine (NICODERM CQ - dosed in mg/24 hours) patch 21 mg, 21 mg, Transdermal, Daily, Lorella Nimrod, MD, 21 mg at 06/21/20 2023 .  ondansetron (ZOFRAN) tablet 4 mg, 4 mg, Oral, Q6H PRN **OR** ondansetron (ZOFRAN) injection 4 mg, 4 mg, Intravenous, Q6H PRN, Amin, Sumayya, MD .  polyethylene glycol (MIRALAX / GLYCOLAX) packet 17 g, 17 g, Oral, Daily PRN, Lorella Nimrod, MD .  sodium chloride flush (NS) 0.9 % injection 3 mL, 3 mL, Intravenous, Q12H, Lorella Nimrod, MD, 3 mL at 06/22/20 1013   Social History: Social History   Tobacco Use  . Smoking status: Current Every Day Smoker    Packs/day: 2.00    Years: 32.00    Pack years: 64.00    Types: Cigarettes  . Smokeless tobacco: Never Used  Substance Use Topics  . Alcohol use: No    Alcohol/week: 0.0 standard drinks  . Drug use: Yes    Types: Marijuana    Family Medical History: Family History  Problem Relation Age of Onset  . Diabetes Sister   . Diabetes Brother     Physical Examination: Vitals:   06/22/20 0830 06/22/20 1132  BP: Marland Kitchen)  143/96 138/82  Pulse: 89 92  Resp: 18 18  Temp: 98.4 F (36.9 C)   SpO2: 97% 94%     General: Patient is well developed, well nourished, calm, collected, and in no apparent distress.  Psychiatric: Patient is non-anxious.  Head:  Pupils equal, round, and reactive to light.  ENT:  Oral mucosa appears well hydrated.  Neck:   Supple.  Full range of motion.  NEUROLOGICAL:  General: In no acute distress.   Awake, alert, oriented to person, place, and time.  Pupils equal round and reactive to light.   EOMI, no  nystagmus Face symmetric. Facial tone is symmetric.  Tongue protrusion is midline.  Able to name, repeat, comprehend. Follows simple commands.  R upper arm and leg weakness noted grossly throughout.  Left upper and lower extremity full strength  SILT to light touch throughout.      Imaging: CT Head 06/21/2020: IMPRESSION: Vasogenic edema in the left frontal lobe associated with a 1.6 cm lesion. This likely reflects a metastasis given lung mass. Contrast enhanced MRI is recommended to exclude additional lesions.  No acute intracranial hemorrhage or significant mass effect.   MRI Brain 06/21/2020: Brain: Enhancing mass lesion left medial frontal cortex. The enhancing mass measures 19 x 16 mm and has central necrosis and moderate surrounding vasogenic edema in the white matter.  No second mass lesion identified.  Chronic infarct right frontal lobe.  No acute infarct or hemorrhage.  Vascular: Normal arterial flow voids.  Skull and upper cervical spine: No focal skeletal lesion.  Sinuses/Orbits: Mild mucosal edema right maxillary sinus. Negative orbit  Other: None  IMPRESSION: Solitary metastatic deposit in the left medial frontal lobe with surrounding vasogenic edema. Chronic infarct right frontal lobe.   Assessment and Plan: Mr. Sahagun is a pleasant 59 y.o. male with recently diagnosed lung mass, with acute onset of R upper and lower extremity weakness who is found to have a left medial frontal cortex lesion.  - Recommend biopsy of lung mass for preliminary pathology - Decadron 4mg  BID - Further recommendations to follow based on prelim pathology   Lonell Face, NP Dept. of Neurosurgery

## 2020-06-22 NOTE — Consult Note (Signed)
06/22/20  Urology  Possible large bladder mass on CT in addition on lung mass/ solitary brain lesion.  Case discussed earlier today with Dr. Rogue Bussing.  Stopped by to see the patient to discuss the differential diagnosis as well as possible further diagnostic intervention in the form of cystoscopy/TURBT.    Patient indicated that he intends to sign out AMA today to seek care at [facility].  As such, will hold off on formal consult.    We did briefly discuss bladder findings in the setting of extensive smoking history.  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  DDx also bladder clot but given that urine is clear, this is highly unlikely.  He has been having some significant urinary urgency, urge incontinence and frequency which is also concerning.  Please contact me if the patient does elect stay here at [facility] so I can arrange for further diagnostic intervention.  Hollice Espy, MD

## 2020-06-22 NOTE — Consult Note (Signed)
Pulmonary Medicine          Date: 06/22/2020,   MRN# [id number] GREGARY BLACKARD 02/21/1961     AdmissionWeight: 117.9 kg                 CurrentWeight: 117.9 kg      CHIEF COMPLAINT:   Lung mass   HISTORY OF PRESENT ILLNESS   As per admission history Tyler Gallagher is a 59 y.o. male with medical history significant of diabetes, hypertension, left AKA and new diagnosis of lung mass came to ED with complaint of right upper extremity weakness going on for 3 to 4 days.Recent ED visit with complaint of generalized weakness.  And CT chest with a right lung mass suspicious for malignancy.  Patient is a lifetime smoker. He made an outpatient appointment with [name] oncologist and supposed to see her later this week.  Outpatient PET scan was ordered.  For the past 3 to 4 days he was experiencing right upper extremity weakness and pain whenever trying to use his arm.  Denies any headache, nausea or vomiting.  Denies any change in his vision.  No chest pain or shortness of breath.  No other recent illnesses.  Denies any hematuria or urinary symptoms.  Recent change in his appetite or bowel habits.  Patient is a lifelong heavy smoker.  Smokes 2 to 3 packs/day. I discussed wit patient findings of Right lung mass with hilar adenopathy specifically at station 7 and 4R.  He was agreeable to doing procedure.  After my full evaluation I was notified by Dr Reesa Chew that he may leave AMA.    PAST MEDICAL HISTORY   Past Medical History:  Diagnosis Date  . Diabetes mellitus without complication ([place])   . Hyperlipidemia   . Hypertension      SURGICAL HISTORY   Past Surgical History:  Procedure Laterality Date  . amputation Left 2005   AKA     FAMILY HISTORY   Family History  Problem Relation Age of Onset  . Diabetes Sister   . Diabetes Brother      SOCIAL HISTORY   Social History   Tobacco Use  . Smoking status: Current Every Day Smoker    Packs/day: 2.00    Years:  32.00    Pack years: 64.00    Types: Cigarettes  . Smokeless tobacco: Never Used  Substance Use Topics  . Alcohol use: No    Alcohol/week: 0.0 standard drinks  . Drug use: Yes    Types: Marijuana     MEDICATIONS    Home Medication:    Current Medication:  Current Facility-Administered Medications:  .  acetaminophen (TYLENOL) tablet 650 mg, 650 mg, Oral, Q6H PRN **OR** acetaminophen (TYLENOL) suppository 650 mg, 650 mg, Rectal, Q6H PRN, Lorella Nimrod, MD .  atorvastatin (LIPITOR) tablet 10 mg, 10 mg, Oral, Daily, Lorella Nimrod, MD, 10 mg at 06/21/20 2324 .  dexamethasone (DECADRON) injection 6 mg, 6 mg, Intravenous, Q24H, Amin, Sumayya, MD, 6 mg at 06/22/20 1010 .  gabapentin (NEURONTIN) capsule 900 mg, 900 mg, Oral, TID, Lorella Nimrod, MD, 900 mg at 06/22/20 1017 .  lisinopril (ZESTRIL) tablet 10 mg, 10 mg, Oral, Daily, 10 mg at 06/22/20 1010 **AND** hydrochlorothiazide (MICROZIDE) capsule 12.5 mg, 12.5 mg, Oral, Daily, Amin, Soundra Pilon, MD, 12.5 mg at 06/22/20 1010 .  insulin aspart (novoLOG) injection 0-20 Units, 0-20 Units, Subcutaneous, TID WC, Lorella Nimrod, MD, 4 Units at 06/22/20 1012 .  insulin aspart (novoLOG) injection  0-5 Units, 0-5 Units, Subcutaneous, QHS, Lorella Nimrod, MD, 3 Units at 06/21/20 2122 .  insulin aspart (novoLOG) injection 4 Units, 4 Units, Subcutaneous, TID WC, Lorella Nimrod, MD, 4 Units at 06/22/20 1012 .  nicotine (NICODERM CQ - dosed in mg/24 hours) patch 21 mg, 21 mg, Transdermal, Daily, Lorella Nimrod, MD, 21 mg at 06/21/20 2023 .  ondansetron (ZOFRAN) tablet 4 mg, 4 mg, Oral, Q6H PRN **OR** ondansetron (ZOFRAN) injection 4 mg, 4 mg, Intravenous, Q6H PRN, Lorella Nimrod, MD .  polyethylene glycol (MIRALAX / GLYCOLAX) packet 17 g, 17 g, Oral, Daily PRN, Lorella Nimrod, MD .  sodium chloride flush (NS) 0.9 % injection 3 mL, 3 mL, Intravenous, Q12H, Lorella Nimrod, MD, 3 mL at 06/22/20 1013    ALLERGIES   Penicillins     REVIEW OF SYSTEMS    Review  of Systems:  Gen:  Denies  fever, sweats, chills weigh loss  HEENT: Denies blurred vision, double vision, ear pain, eye pain, hearing loss, nose bleeds, sore throat Cardiac:  No dizziness, chest pain or heaviness, chest tightness,edema Resp:   Denies cough or sputum porduction, shortness of breath,wheezing, hemoptysis,  Gi: Denies swallowing difficulty, stomach pain, nausea or vomiting, diarrhea, constipation, bowel incontinence Gu:  Denies bladder incontinence, burning urine Ext:   Denies Joint pain, stiffness or swelling Skin: Denies  skin rash, easy bruising or bleeding or hives Endoc:  Denies polyuria, polydipsia , polyphagia or weight change Psych:   Denies depression, insomnia or hallucinations   Other:  All other systems negative   VS: BP 138/82 (BP Location: Left Arm)   Pulse 92   Temp 98.4 F (36.9 C)   Resp 18   Ht 6\' 3"  (1.905 m)   Wt 117.9 kg   SpO2 94%   BMI 32.50 kg/m      PHYSICAL EXAM    GENERAL:NAD, no fevers, chills, no weakness no fatigue HEAD: Normocephalic, atraumatic.  EYES: Pupils equal, round, reactive to light. Extraocular muscles intact. No scleral icterus.  MOUTH: Moist mucosal membrane. Dentition intact. No abscess noted.  EAR, NOSE, THROAT: Clear without exudates. No external lesions.  NECK: Supple. No thyromegaly. No nodules. No JVD.  PULMONARY: CTAB CARDIOVASCULAR: S1 and S2. Regular rate and rhythm. No murmurs, rubs, or gallops. No edema. Pedal pulses 2+ bilaterally.  GASTROINTESTINAL: Soft, nontender, nondistended. No masses. Positive bowel sounds. No hepatosplenomegaly.  MUSCULOSKELETAL: No swelling, clubbing, or edema. Range of motion full in all extremities.  NEUROLOGIC: Cranial nerves II through XII are intact. No gross focal neurological deficits. Sensation intact. Reflexes intact.  SKIN: No ulceration, lesions, rashes, or cyanosis. Skin warm and dry. Turgor intact.  PSYCHIATRIC: Mood, affect within normal limits. The patient is awake,  alert and oriented x 3. Insight, judgment intact.       IMAGING    CT HEAD WO CONTRAST  Result Date: 06/21/2020 CLINICAL DATA:  Right-sided weakness EXAM: CT HEAD WITHOUT CONTRAST TECHNIQUE: Contiguous axial images were obtained from the base of the skull through the vertex without intravenous contrast. COMPARISON:  2018 FINDINGS: Brain: There is no acute intracranial hemorrhage. Hypoattenuation is present in the high left frontal white matter reflecting vasogenic edema. There is mild regional mass effect. Probable underlying 1.3 x 0.6 x 1.6 cm lesion of the left superior frontal gyrus. Chronic right frontal infarct. Gray-white differentiation is otherwise preserved. There is no extra-axial fluid collection. Ventricles and sulci are within normal limits in size and configuration. Vascular: No hyperdense vessel or unexpected calcification. Skull: Calvarium is  unremarkable. Sinuses/Orbits: No acute finding. Other: None. IMPRESSION: Vasogenic edema in the left frontal lobe associated with a 1.6 cm lesion. This likely reflects a metastasis given lung mass. Contrast enhanced MRI is recommended to exclude additional lesions. No acute intracranial hemorrhage or significant mass effect. Electronically Signed   By: Macy Mis M.D.   On: 06/21/2020 13:09   MR Brain W and Wo Contrast  Result Date: 06/21/2020 CLINICAL DATA:  Brain mass.  Lung mass. EXAM: MRI HEAD WITHOUT AND WITH CONTRAST TECHNIQUE: Multiplanar, multiecho pulse sequences of the brain and surrounding structures were obtained without and with intravenous contrast. CONTRAST:  61mL GADAVIST GADOBUTROL 1 MMOL/ML IV SOLN COMPARISON:  CT head 06/21/2020 FINDINGS: Brain: Enhancing mass lesion left medial frontal cortex. The enhancing mass measures 19 x 16 mm and has central necrosis and moderate surrounding vasogenic edema in the white matter. No second mass lesion identified. Chronic infarct right frontal lobe.  No acute infarct or hemorrhage.  Vascular: Normal arterial flow voids. Skull and upper cervical spine: No focal skeletal lesion. Sinuses/Orbits: Mild mucosal edema right maxillary sinus. 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 Chronic infarct right frontal lobe. Electronically Signed   By: Franchot Gallo M.D.   On: 06/21/2020 15:15   CT ABDOMEN PELVIS W CONTRAST  Result Date: 06/21/2020 CLINICAL DATA:  Right-sided weakness for 3-4 days. EXAM: CT ABDOMEN AND PELVIS WITH CONTRAST TECHNIQUE: Multidetector CT imaging of the abdomen and pelvis was performed using the standard protocol following bolus administration of intravenous contrast. CONTRAST:  169mL OMNIPAQUE IOHEXOL 300 MG/ML  SOLN COMPARISON:  None. FINDINGS: Lower chest: A 3.1 cm diameter round noncalcified soft tissue mass is seen within the posteromedial aspect of the right lower lobe. Hepatobiliary: There is diffuse fatty infiltration of the liver parenchyma. No focal liver abnormality is seen. A cluster of subcentimeter gallstones are seen within the gallbladder lumen. There is no evidence of gallbladder wall thickening or biliary dilatation. Pancreas: Unremarkable. No pancreatic ductal dilatation or surrounding inflammatory changes. Spleen: Normal in size without focal abnormality. Adrenals/Urinary Tract: Adrenal glands are unremarkable. Kidneys are normal, without renal calculi or hydronephrosis. A 1.9 cm diameter cyst is seen adjacent to the lateral aspect of the mid left kidney. An 8.0 cm x 4.8 cm area of heterogeneous mildly increased attenuation is seen within the dependent portion of the urinary bladder. Stomach/Bowel: Stomach is within normal limits. Appendix appears normal. No evidence of bowel wall thickening, distention, or inflammatory changes. Noninflamed diverticula are seen within the proximal sigmoid colon. Vascular/Lymphatic: There is marked severity calcification of the  abdominal aorta. No enlarged abdominal or pelvic lymph nodes. Reproductive: The prostate gland is mildly enlarged. Other: No abdominal wall hernia or abnormality. No abdominopelvic ascites. Musculoskeletal: Degenerative changes seen within the lower lumbar spine at the level of L5-S1. IMPRESSION: 1. 3.1 cm diameter round noncalcified soft tissue mass within the posteromedial aspect of the right lower lobe. Further evaluation with chest CT is recommended, as an underlying neoplasm cannot be excluded. 2. Cholelithiasis. 3. Sigmoid diverticulosis. 4. Large area of heterogeneous increased attenuation within the lumen of the urinary bladder. While this may represent a large amount of blood products, the presence of a urinary bladder mass cannot be excluded. Aortic Atherosclerosis (ICD10-I70.0). Electronically Signed   By: Virgina Norfolk M.D.   On: 06/21/2020 16:42   CT OUTSIDE FILMS CHEST  Result Date: 06/22/2020 This examination belongs to an outside facility and is stored  here for comparison purposes only.  Contact the originating outside institution for any associated report or interpretation.  DG Outside Films Chest  Result Date: 06/22/2020 This examination belongs to an outside facility and is stored here for comparison purposes only.  Contact the originating outside institution for any associated report or interpretation.     ASSESSMENT/PLAN   3cm RLL lung mass -discussed with patient need for tissue diagnosis via bronchoscopy -patient had asaprin on Monday last dose -patient is on DVT ppx dose of lovenox will hold for procedure and NPO post midnight on Thursday -plan for bronchoscopy at 730AM Friday  -questions answered and patient is agreeable.       Thank you for allowing me to participate in the care of this patient.   Patient/Family are satisfied with care plan and all questions have been answered.   This document was prepared using Dragon voice recognition software and may  include unintentional dictation errors.     [name], M.D.  Division of [place]

## 2020-06-22 NOTE — Plan of Care (Signed)
Received pt alert and oriented x 4. Verbally expresses needs denies pain. Inc bowel and bladder LBM 06/20/20. Appetite good. Ate 100% breakfast this AM. Pt has expressed that he is concerned that care here may not be aggressive enough for his new diagnoses and would prefer to go to [facility] and wants to sign out AMA will speak with MD

## 2020-06-22 NOTE — Progress Notes (Signed)
Pt left AMA around 1237 feels can get more aggress ive care at [facility] MD notified of decision

## 2020-06-22 NOTE — Discharge Summary (Signed)
Physician Discharge Summary  Tyler Gallagher CVE:[id number] DOB: 30-Oct-1961 DOA: 06/21/2020  PCP: Juluis Pitch, MD  Admit date: 06/21/2020 Discharge date: 06/22/2020  Admitted From: Home Disposition: Patient left AMA  Recommendations for Outpatient Follow-up:  1. Follow up with PCP in 1-2 weeks 2. Please obtain BMP/CBC in one week 3. Please follow up on the following pending results:  Home Health: No  equipment/Devices: None Discharge Condition: Undergoing investigation, with vasogenic edema, left AMA CODE STATUS: Full Diet recommendation: Heart Healthy / Carb Modified / Regular / Dysphagia   Brief/Interim Summary: Tyler Gallagher is a 59 y.o. male with medical history significant of diabetes, hypertension, left AKA and new diagnosis of lung mass came to ED with complaint of right upper extremity weakness going on for 3 to 4 days.  Recent ED visit with complaint of generalized weakness.  And CT chest with a right lung mass suspicious for malignancy.  Patient is a lifetime smoker. He made an outpatient appointment with [name] oncologist and supposed to see her later this week.  Outpatient PET scan was ordered.  For the past 3 to 4 days he was experiencing right upper extremity weakness and pain whenever trying to use his arm.  Denies any headache, nausea or vomiting.  Denies any change in his vision.  No chest pain or shortness of breath.  No other recent illnesses.  Denies any hematuria or urinary symptoms.  Recent change in his appetite or bowel habits.  Patient is a lifelong heavy smoker.  Smokes 2 to 3 packs/day.  CT head with some vasogenic edema in left frontal lobe, MRI brain with solitary metastatic lesion in left frontal lobe with surrounding vasogenic edema.  CT abdomen and pelvis with right lower lobe mass and some heterogenous area within bladder needs further evaluation. ED provider tried to transfer him to [facility] with no success. Oncology and neurosurgery was  consulted. Oncology discussed with IR and they recommend to call pulmonary for a possible bronchoscopy and biopsy.  Pulmonary was consulted and they were planning to do bronchoscopy and biopsy on Friday.  Urology was also consulted due to lesion in his bladder.  Per urology note he did admit with them some urinary urgency and hesitancy.  He did not admit any urinary symptoms with me.  He told urology that he is thinking of leaving AMA and going to [place] himself. Patient later left AMA.  He was given loading dose of Decadron and started on Decadron 6 mg twice daily for vasogenic edema.   Discharge Diagnoses:  Active Problems:   Vasogenic brain edema (HCC)   Brain metastasis [facility])  Discharge Instructions    Allergies  Allergen Reactions  . Penicillins     Consultations:  Oncology  Neurosurgery  Urology  IR  Pulmonology  Procedures/Studies: CT HEAD WO CONTRAST  Result Date: 06/21/2020 CLINICAL DATA:  Right-sided weakness EXAM: CT HEAD WITHOUT CONTRAST TECHNIQUE: Contiguous axial images were obtained from the base of the skull through the vertex without intravenous contrast. COMPARISON:  2018 FINDINGS: Brain: There is no acute intracranial hemorrhage. Hypoattenuation is present in the high left frontal white matter reflecting vasogenic edema. There is mild regional mass effect. Probable underlying 1.3 x 0.6 x 1.6 cm lesion of the left superior frontal gyrus. Chronic right frontal infarct. Gray-white differentiation is otherwise preserved. There is no extra-axial fluid collection. Ventricles and sulci are within normal limits in size and configuration. Vascular: No hyperdense vessel or unexpected calcification. Skull: Calvarium is unremarkable. Sinuses/Orbits: No acute  finding. Other: None. IMPRESSION: Vasogenic edema in the left frontal lobe associated with a 1.6 cm lesion. This likely reflects a metastasis given lung mass. Contrast enhanced MRI is recommended to exclude additional  lesions. No acute intracranial hemorrhage or significant mass effect. Electronically Signed   By: Macy Mis M.D.   On: 06/21/2020 13:09   MR Brain W and Wo Contrast  Result Date: 06/21/2020 CLINICAL DATA:  Brain mass.  Lung mass. EXAM: MRI HEAD WITHOUT AND WITH CONTRAST TECHNIQUE: Multiplanar, multiecho pulse sequences of the brain and surrounding structures were obtained without and with intravenous contrast. CONTRAST:  50mL GADAVIST GADOBUTROL 1 MMOL/ML IV SOLN COMPARISON:  CT head 06/21/2020 FINDINGS: Brain: Enhancing mass lesion left medial frontal cortex. The enhancing mass measures 19 x 16 mm and has central necrosis and moderate surrounding vasogenic edema in the white matter. No second mass lesion identified. Chronic infarct right frontal lobe.  No acute infarct or hemorrhage. Vascular: Normal arterial flow voids. Skull and upper cervical spine: No focal skeletal lesion. Sinuses/Orbits: Mild mucosal edema right maxillary sinus. 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 Chronic infarct right frontal lobe. Electronically Signed   By: Franchot Gallo M.D.   On: 06/21/2020 15:15   CT ABDOMEN PELVIS W CONTRAST  Result Date: 06/21/2020 CLINICAL DATA:  Right-sided weakness for 3-4 days. EXAM: CT ABDOMEN AND PELVIS WITH CONTRAST TECHNIQUE: Multidetector CT imaging of the abdomen and pelvis was performed using the standard protocol following bolus administration of intravenous contrast. CONTRAST:  131mL OMNIPAQUE IOHEXOL 300 MG/ML  SOLN COMPARISON:  None. FINDINGS: Lower chest: A 3.1 cm diameter round noncalcified soft tissue mass is seen within the posteromedial aspect of the right lower lobe. Hepatobiliary: There is diffuse fatty infiltration of the liver parenchyma. No focal liver abnormality is seen. A cluster of subcentimeter gallstones are seen within the gallbladder lumen. There is no evidence of gallbladder  wall thickening or biliary dilatation. Pancreas: Unremarkable. No pancreatic ductal dilatation or surrounding inflammatory changes. Spleen: Normal in size without focal abnormality. Adrenals/Urinary Tract: Adrenal glands are unremarkable. Kidneys are normal, without renal calculi or hydronephrosis. A 1.9 cm diameter cyst is seen adjacent to the lateral aspect of the mid left kidney. An 8.0 cm x 4.8 cm area of heterogeneous mildly increased attenuation is seen within the dependent portion of the urinary bladder. Stomach/Bowel: Stomach is within normal limits. Appendix appears normal. No evidence of bowel wall thickening, distention, or inflammatory changes. Noninflamed diverticula are seen within the proximal sigmoid colon. Vascular/Lymphatic: There is marked severity calcification of the abdominal aorta. No enlarged abdominal or pelvic lymph nodes. Reproductive: The prostate gland is mildly enlarged. Other: No abdominal wall hernia or abnormality. No abdominopelvic ascites. Musculoskeletal: Degenerative changes seen within the lower lumbar spine at the level of L5-S1. IMPRESSION: 1. 3.1 cm diameter round noncalcified soft tissue mass within the posteromedial aspect of the right lower lobe. Further evaluation with chest CT is recommended, as an underlying neoplasm cannot be excluded. 2. Cholelithiasis. 3. Sigmoid diverticulosis. 4. Large area of heterogeneous increased attenuation within the lumen of the urinary bladder. While this may represent a large amount of blood products, the presence of a urinary bladder mass cannot be excluded. Aortic Atherosclerosis (ICD10-I70.0). Electronically Signed   By: Virgina Norfolk M.D.   On: 06/21/2020 16:42   CT OUTSIDE FILMS CHEST  Result Date: 06/22/2020 This examination belongs to an outside facility and is stored here for comparison purposes  only.  Contact the originating outside institution for any associated report or interpretation.  DG Outside Films  Chest  Result Date: 06/22/2020 This examination belongs to an outside facility and is stored here for comparison purposes only.  Contact the originating outside institution for any associated report or interpretation.   Subjective: Patient has no new complaints today.  Stating that his leg seems improving with steroid.  He was very concerned about his cancer and wants to be transferred to [facility] or [facility].  We discussed that it is going to be a heart transfer as there is no bed availability.  We contacted [facility] yesterday.  We discussed getting some definitive diagnosis. Later find out from nursing staff that patient left AMA to go to [place] himself.  Discharge Exam: Vitals:   06/22/20 0830 06/22/20 1132  BP: (!) 143/96 138/82  Pulse: 89 92  Resp: 18 18  Temp: 98.4 F (36.9 C)   SpO2: 97% 94%   Vitals:   06/21/20 2214 06/22/20 0552 06/22/20 0830 06/22/20 1132  BP: (!) 149/87 123/79 (!) 143/96 138/82  Pulse: 88 79 89 92  Resp: 18 18 18 18   Temp: 98 F (36.7 C) (!) 97.4 F (36.3 C) 98.4 F (36.9 C)   TempSrc: Oral Oral    SpO2: 98% 97% 97% 94%  Weight:      Height:        General: Pt is alert, awake, not in acute distress Cardiovascular: RRR, S1/S2 +, no rubs, no gallops Respiratory: CTA bilaterally, no wheezing, no rhonchi Abdominal: Soft, NT, ND, bowel sounds + Extremities: no edema, no cyanosis.  Left AKA   The results of significant diagnostics from this hospitalization (including imaging, microbiology, ancillary and laboratory) are listed below for reference.    Microbiology: Recent Results (from the past 240 hour(s))  SARS Coronavirus 2 by RT PCR (hospital order, performed in [facility] hospital lab) Nasopharyngeal Nasopharyngeal Swab     Status: None   Collection Time: 06/21/20  3:38 PM   Specimen: Nasopharyngeal Swab  Result Value Ref Range Status   SARS Coronavirus 2 NEGATIVE NEGATIVE Final    Comment: (NOTE) SARS-CoV-2 target nucleic acids are NOT DETECTED.  The  SARS-CoV-2 RNA is generally detectable in upper and lower respiratory specimens during the acute phase of infection. The lowest concentration of SARS-CoV-2 viral copies this assay can detect is 250 copies / mL. A negative result does not preclude SARS-CoV-2 infection and should not be used as the sole basis for treatment or other patient management decisions.  A negative result may occur with improper specimen collection / handling, submission of specimen other than nasopharyngeal swab, presence of viral mutation(s) within the areas targeted by this assay, and inadequate number of viral copies (<250 copies / mL). A negative result must be combined with clinical observations, patient history, and epidemiological information.  Fact Sheet for Patients:   StrictlyIdeas.no  Fact Sheet for Healthcare Providers: BankingDealers.co.za  This test is not yet approved or  cleared by the [place] FDA and has been authorized for detection and/or diagnosis of SARS-CoV-2 by FDA under an Emergency Use Authorization (EUA).  This EUA will remain in effect (meaning this test can be used) for the duration of the COVID-19 declaration under Section 564(b)(1) of the Act, 21 U.S.C. section 360bbb-3(b)(1), unless the authorization is terminated or revoked sooner.  Performed at [facility], [address] [place],  [address]      Labs: BNP (last 3 results) No results for  input(s): BNP in the last 8760 hours. Basic Metabolic Panel: Recent Labs  Lab 06/21/20 1159  NA 136  K 3.5  CL 97*  CO2 27  GLUCOSE 207*  BUN 9  CREATININE 0.71  CALCIUM 9.7   Liver Function Tests: Recent Labs  Lab 06/21/20 1159  AST 30  ALT 42  ALKPHOS 90  BILITOT 0.7  PROT 7.7  ALBUMIN 4.2   No results for input(s): LIPASE, AMYLASE in the last 168 hours. No results for input(s): AMMONIA in the last 168 hours. CBC: Recent Labs  Lab  06/21/20 1159  WBC 7.6  NEUTROABS 4.7  HGB 15.9  HCT 45.4  MCV 87.6  PLT 229   Cardiac Enzymes: No results for input(s): CKTOTAL, CKMB, CKMBINDEX, TROPONINI in the last 168 hours. BNP: Invalid input(s): POCBNP CBG: Recent Labs  Lab 06/21/20 1155 06/21/20 2114 06/22/20 0831 06/22/20 1132  GLUCAP 223* 273* 166* 217*   D-Dimer No results for input(s): DDIMER in the last 72 hours. Hgb A1c No results for input(s): HGBA1C in the last 72 hours. Lipid Profile No results for input(s): CHOL, HDL, LDLCALC, TRIG, CHOLHDL, LDLDIRECT in the last 72 hours. Thyroid function studies No results for input(s): TSH, T4TOTAL, T3FREE, THYROIDAB in the last 72 hours.  Invalid input(s): FREET3 Anemia work up No results for input(s): VITAMINB12, FOLATE, FERRITIN, TIBC, IRON, RETICCTPCT in the last 72 hours. Urinalysis    Component Value Date/Time   COLORURINE STRAW (A) 06/15/2020 1354   APPEARANCEUR CLEAR (A) 06/15/2020 1354   LABSPEC 1.003 (L) 06/15/2020 1354   PHURINE 6.0 06/15/2020 1354   GLUCOSEU 150 (A) 06/15/2020 1354   HGBUR NEGATIVE 06/15/2020 1354   Ewa Gentry 06/15/2020 1354   KETONESUR NEGATIVE 06/15/2020 1354   PROTEINUR NEGATIVE 06/15/2020 1354   NITRITE NEGATIVE 06/15/2020 1354   LEUKOCYTESUR NEGATIVE 06/15/2020 1354   Sepsis Labs Invalid input(s): PROCALCITONIN,  WBC,  LACTICIDVEN Microbiology Recent Results (from the past 240 hour(s))  SARS Coronavirus 2 by RT PCR (hospital order, performed in [place] hospital lab) Nasopharyngeal Nasopharyngeal Swab     Status: None   Collection Time: 06/21/20  3:38 PM   Specimen: Nasopharyngeal Swab  Result Value Ref Range Status   SARS Coronavirus 2 NEGATIVE NEGATIVE Final    Comment: (NOTE) SARS-CoV-2 target nucleic acids are NOT DETECTED.  The SARS-CoV-2 RNA is generally detectable in upper and lower respiratory specimens during the acute phase of infection. The lowest concentration of SARS-CoV-2 viral copies  this assay can detect is 250 copies / mL. A negative result does not preclude SARS-CoV-2 infection and should not be used as the sole basis for treatment or other patient management decisions.  A negative result may occur with improper specimen collection / handling, submission of specimen other than nasopharyngeal swab, presence of viral mutation(s) within the areas targeted by this assay, and inadequate number of viral copies (<250 copies / mL). A negative result must be combined with clinical observations, patient history, and epidemiological information.  Fact Sheet for Patients:   StrictlyIdeas.no  Fact Sheet for Healthcare Providers: BankingDealers.co.za  This test is not yet approved or  cleared by the [place] FDA and has been authorized for detection and/or diagnosis of SARS-CoV-2 by FDA under an Emergency Use Authorization (EUA).  This EUA will remain in effect (meaning this test can be used) for the duration of the COVID-19 declaration under Section 564(b)(1) of the Act, 21 U.S.C. section 360bbb-3(b)(1), unless the authorization is terminated or revoked sooner.  Performed at  [facility], [address], [place], [place] [address]     Time coordinating discharge: Over 30 minutes  SIGNED:  Lorella Nimrod, MD  [facility] 06/22/2020, 4:56 PM  If 7PM-7AM, please contact night-coverage [email or web address]  This record has been created using Systems analyst. Errors have been sought and corrected,but may not always be located. Such creation errors do not reflect on the standard of care.

## 2020-06-24 ENCOUNTER — Inpatient Hospital Stay: Payer: Medicare HMO

## 2020-06-24 ENCOUNTER — Inpatient Hospital Stay (HOSPITAL_BASED_OUTPATIENT_CLINIC_OR_DEPARTMENT_OTHER): Payer: Medicare HMO | Admitting: Oncology

## 2020-06-24 ENCOUNTER — Encounter (INDEPENDENT_AMBULATORY_CARE_PROVIDER_SITE_OTHER): Payer: Self-pay

## 2020-06-24 ENCOUNTER — Other Ambulatory Visit: Payer: Self-pay

## 2020-06-24 ENCOUNTER — Other Ambulatory Visit: Payer: Self-pay | Admitting: *Deleted

## 2020-06-24 ENCOUNTER — Encounter: Payer: Self-pay | Admitting: *Deleted

## 2020-06-24 ENCOUNTER — Inpatient Hospital Stay: Payer: Medicare HMO | Attending: Oncology | Admitting: Oncology

## 2020-06-24 VITALS — BP 137/105 | HR 92 | Temp 96.0°F | Resp 16

## 2020-06-24 DIAGNOSIS — Z87891 Personal history of nicotine dependence: Secondary | ICD-10-CM | POA: Diagnosis not present

## 2020-06-24 DIAGNOSIS — E785 Hyperlipidemia, unspecified: Secondary | ICD-10-CM | POA: Diagnosis not present

## 2020-06-24 DIAGNOSIS — Z79899 Other long term (current) drug therapy: Secondary | ICD-10-CM | POA: Diagnosis not present

## 2020-06-24 DIAGNOSIS — G939 Disorder of brain, unspecified: Secondary | ICD-10-CM

## 2020-06-24 DIAGNOSIS — F17210 Nicotine dependence, cigarettes, uncomplicated: Secondary | ICD-10-CM

## 2020-06-24 DIAGNOSIS — Z833 Family history of diabetes mellitus: Secondary | ICD-10-CM | POA: Diagnosis not present

## 2020-06-24 DIAGNOSIS — Z7982 Long term (current) use of aspirin: Secondary | ICD-10-CM | POA: Diagnosis not present

## 2020-06-24 DIAGNOSIS — Z8673 Personal history of transient ischemic attack (TIA), and cerebral infarction without residual deficits: Secondary | ICD-10-CM | POA: Diagnosis not present

## 2020-06-24 DIAGNOSIS — F419 Anxiety disorder, unspecified: Secondary | ICD-10-CM | POA: Insufficient documentation

## 2020-06-24 DIAGNOSIS — Z7984 Long term (current) use of oral hypoglycemic drugs: Secondary | ICD-10-CM | POA: Insufficient documentation

## 2020-06-24 DIAGNOSIS — I10 Essential (primary) hypertension: Secondary | ICD-10-CM | POA: Diagnosis not present

## 2020-06-24 DIAGNOSIS — C801 Malignant (primary) neoplasm, unspecified: Secondary | ICD-10-CM | POA: Insufficient documentation

## 2020-06-24 DIAGNOSIS — N400 Enlarged prostate without lower urinary tract symptoms: Secondary | ICD-10-CM | POA: Insufficient documentation

## 2020-06-24 DIAGNOSIS — R918 Other nonspecific abnormal finding of lung field: Secondary | ICD-10-CM | POA: Diagnosis not present

## 2020-06-24 DIAGNOSIS — E119 Type 2 diabetes mellitus without complications: Secondary | ICD-10-CM | POA: Diagnosis not present

## 2020-06-24 DIAGNOSIS — E669 Obesity, unspecified: Secondary | ICD-10-CM | POA: Insufficient documentation

## 2020-06-24 DIAGNOSIS — N329 Bladder disorder, unspecified: Secondary | ICD-10-CM | POA: Insufficient documentation

## 2020-06-24 DIAGNOSIS — C7931 Secondary malignant neoplasm of brain: Secondary | ICD-10-CM | POA: Insufficient documentation

## 2020-06-24 DIAGNOSIS — N3289 Other specified disorders of bladder: Secondary | ICD-10-CM

## 2020-06-24 DIAGNOSIS — Z7952 Long term (current) use of systemic steroids: Secondary | ICD-10-CM | POA: Insufficient documentation

## 2020-06-24 DIAGNOSIS — E781 Pure hyperglyceridemia: Secondary | ICD-10-CM | POA: Diagnosis not present

## 2020-06-24 DIAGNOSIS — Z89612 Acquired absence of left leg above knee: Secondary | ICD-10-CM | POA: Diagnosis not present

## 2020-06-24 LAB — URINE DRUG SCREEN, QUALITATIVE (ARMC ONLY)
Amphetamines, Ur Screen: NOT DETECTED
Barbiturates, Ur Screen: NOT DETECTED
Benzodiazepine, Ur Scrn: NOT DETECTED
Cannabinoid 50 Ng, Ur ~~LOC~~: NOT DETECTED
Cocaine Metabolite,Ur ~~LOC~~: NOT DETECTED
MDMA (Ecstasy)Ur Screen: NOT DETECTED
Methadone Scn, Ur: NOT DETECTED
Opiate, Ur Screen: NOT DETECTED
Phencyclidine (PCP) Ur S: NOT DETECTED
Tricyclic, Ur Screen: NOT DETECTED

## 2020-06-24 LAB — HEMOGLOBIN A1C
Hgb A1c MFr Bld: 7.7 % — ABNORMAL HIGH (ref 4.8–5.6)
Mean Plasma Glucose: 174 mg/dL

## 2020-06-24 MED ORDER — NICOTINE 21 MG/24HR TD PT24: 21.0000 mg | MEDICATED_PATCH | Freq: Every day | TRANSDERMAL | 0 refills | Status: DC

## 2020-06-24 MED ORDER — DEXAMETHASONE 4 MG PO TABS: 8.0000 mg | ORAL_TABLET | Freq: Two times a day (BID) | ORAL | 1 refills | Status: DC

## 2020-06-24 MED ORDER — LORAZEPAM 0.5 MG PO TABS
0.5000 mg | ORAL_TABLET | Freq: Three times a day (TID) | ORAL | 0 refills | Status: DC | PRN
Start: 2020-06-24 — End: 2020-10-17

## 2020-06-24 MED ORDER — NICOTINE 10 MG IN INHA: 1.0000 | RESPIRATORY_TRACT | 0 refills | Status: DC | PRN

## 2020-06-24 NOTE — Progress Notes (Signed)
Smoking Cessation [place]  [phone] [phone]  Patient Care Team: Juluis Pitch, MD as PCP - General (Family Medicine) Telford Nab, RN as Oncology Nurse Navigator   Name of the patient: Tyler Gallagher  [id number]  05-Jun-1961   Date of visit: 06/24/2020   Diagnosis- 1. Cigarette nicotine dependence, uncomplicated  2. Personal history of tobacco use, presenting hazards to health  Other orders - nicotine (NICODERM CQ - DOSED IN MG/24 HOURS) 21 mg/24hr patch; Place 1 patch (21 mg total) onto the skin daily. - nicotine (NICOTROL) 10 MG inhaler; Inhale 1 Cartridge (1 continuous puffing total) into the lungs as needed for smoking cessation.   Chief complaint/ Reason for visit- Smoking Cessation counseling  PMH-Tyler Gallagher is a 59 year old male with past medical history significant for TIA, type 2 diabetes, hyperlipidemia, tobacco abuse, obesity, above-knee amputation and recent diagnosis of right lower lung mass.  He initially presented to [facility] ED for right upper extremity weakness x3 to 4 days.  Work-up included CT abdomen pelvis, brain and MRI of the brain which unfortunately revealed solitary brain mass.  He was discharged home on oral steroids with short-term follow-up with [name].  Plan is for a PET scan next week.  Interval history- Tyler Gallagher is a 59 yo patient who presents to smoking cessation clinic to discuss strategies and techniques to quit smoking.  The goal of this program is to implement proven ineffective interventions in a clinical setting to successfully quit smoking and remain tobacco free.  During our visit today, we will discuss strategies and techniques for tobacco cessation.   Smoking history:  Tobacco Use: High Risk  . Smoking Tobacco Use: Current Every Day Smoker  . Smokeless Tobacco Use: Never Used     Type of tobacco:   Cigarettes- (2-3 ppd X 40 years)   Approximate date of last quit  attempt: Last cigarette was on Wednesday, 06/22/2020.   Longest period of time quit in the past: approximately 30 days   What caused the relapse: Being exposed to other smokers  Medication used in the past:  Nicotine patch-patient has been hospitalized on several different occasions where he was unable to smoke and he used a nicotine patch which he reports as useful.  He has never tried bupropion or Chantix.   Readiness to quit: He is ready to quit.  His last cigarette was on 06/22/2020.   Other smokers in household: Tyler Gallagher wife continues to smoke.  Plan: Quit date: 06/22/2020  Today, Tyler Gallagher is doing well.  He is very anxious with his possible new diagnosis.  He feels better since discharge from hospital.  Denies any new neurological complaints, recent fevers or illness.  He denies any bleeding or bruising.  Reports a good appetite and denies any unintentional weight loss.  Denies chest pain, nausea, vomiting, constipation or diarrhea.  He met with [name] this morning to discuss plan of care for lung, bladder and brain mass.   ECOG FS:2 - Symptomatic, <50% confined to bed  Review of systems- Review of Systems  Constitutional: Negative.  Negative for chills, fever, malaise/fatigue and weight loss.       Wheelchair  HENT: Negative for congestion, ear pain and tinnitus.   Eyes: Negative.  Negative for blurred vision and double vision.  Respiratory: Negative.  Negative for cough, sputum production and shortness of breath.   Cardiovascular: Negative.  Negative for chest pain, palpitations and leg swelling.  Gastrointestinal: Negative.  Negative for abdominal pain, constipation, diarrhea, nausea and vomiting.       AKA-left   Genitourinary: Negative for dysuria, frequency and urgency.  Musculoskeletal: Negative for back pain and falls.  Skin: Negative.  Negative for rash.  Neurological: Negative.  Negative for weakness and headaches.  Endo/Heme/Allergies: Negative.  Does not  bruise/bleed easily.  Psychiatric/Behavioral: Negative.  Negative for depression. The patient is not nervous/anxious and does not have insomnia.       Allergies  Allergen Reactions  . Penicillins      Past Medical History:  Diagnosis Date  . Diabetes mellitus without complication ([place])   . Hyperlipidemia   . Hypertension      Past Surgical History:  Procedure Laterality Date  . amputation Left 2005   AKA    Social History   Socioeconomic History  . Marital status: Divorced    Spouse name: Not on file  . Number of children: Not on file  . Years of education: Not on file  . Highest education level: Not on file  Occupational History  . Not on file  Tobacco Use  . Smoking status: Current Every Day Smoker    Packs/day: 2.00    Years: 32.00    Pack years: 64.00    Types: Cigarettes  . Smokeless tobacco: Never Used  Substance and Sexual Activity  . Alcohol use: No    Alcohol/week: 0.0 standard drinks  . Drug use: Yes    Types: Marijuana  . Sexual activity: Not on file  Other Topics Concern  . Not on file  Social History Narrative  . Not on file   Social Determinants of Health   Financial Resource Strain:   . Difficulty of Paying Living Expenses:   Food Insecurity:   . Worried About Charity fundraiser in the Last Year:   . Arboriculturist in the Last Year:   Transportation Needs:   . Film/video editor (Medical):   Marland Kitchen Lack of Transportation (Non-Medical):   Physical Activity:   . Days of Exercise per Week:   . Minutes of Exercise per Session:   Stress:   . Feeling of Stress :   Social Connections:   . Frequency of Communication with Friends and Family:   . Frequency of Social Gatherings with Friends and Family:   . Attends Religious Services:   . Active Member of Clubs or Organizations:   . Attends Archivist Meetings:   Marland Kitchen Marital Status:   Intimate Partner Violence:   . Fear of Current or Ex-Partner:   . Emotionally Abused:   Marland Kitchen  Physically Abused:   . Sexually Abused:     Family History  Problem Relation Age of Onset  . Diabetes Sister   . Diabetes Brother      Current Outpatient Medications:  .  aspirin EC 325 MG tablet, Take 1 tablet (325 mg total) by mouth daily. (Patient not taking: Reported on 06/21/2020), Disp: 30 tablet, Rfl: 0 .  atorvastatin (LIPITOR) 10 MG tablet, Take 10 mg by mouth daily., Disp: , Rfl:  .  Cyanocobalamin (RA VITAMIN B-12 TR) 1000 MCG TBCR, Take 1 tablet by mouth daily. , Disp: , Rfl:  .  dexamethasone (DECADRON) 4 MG tablet, Take 2 tablets (8 mg total) by mouth 2 (two) times daily., Disp: 60 tablet, Rfl: 1 .  gabapentin (NEURONTIN) 300 MG capsule, Take 900 mg by mouth 3 (three) times daily., Disp: , Rfl:  .  gemfibrozil (LOPID) 600 MG  tablet, Take 0.5 tablets by mouth 2 (two) times daily., Disp: , Rfl:  .  lisinopril-hydrochlorothiazide (ZESTORETIC) 10-12.5 MG tablet, Take 1 tablet by mouth daily., Disp: , Rfl:  .  LORazepam (ATIVAN) 0.5 MG tablet, Take 1 tablet (0.5 mg total) by mouth every 8 (eight) hours as needed for anxiety., Disp: 30 tablet, Rfl: 0 .  metFORMIN (GLUCOPHAGE-XR) 500 MG 24 hr tablet, Take 500 mg by mouth 2 (two) times daily., Disp: , Rfl:  .  nicotine (NICODERM CQ - DOSED IN MG/24 HOURS) 21 mg/24hr patch, Place 1 patch (21 mg total) onto the skin daily., Disp: 28 patch, Rfl: 0 .  nicotine (NICOTROL) 10 MG inhaler, Inhale 1 Cartridge (1 continuous puffing total) into the lungs as needed for smoking cessation., Disp: 42 each, Rfl: 0 .  pioglitazone (ACTOS) 30 MG tablet, Take 30 mg by mouth daily., Disp: , Rfl:   Physical exam: There were no vitals filed for this visit. Physical Exam Constitutional:      Appearance: Normal appearance.     Comments: Wheelchair  HENT:     Head: Normocephalic and atraumatic.  Eyes:     Pupils: Pupils are equal, round, and reactive to light.  Cardiovascular:     Rate and Rhythm: Normal rate and regular rhythm.     Heart sounds:  Normal heart sounds. No murmur heard.   Pulmonary:     Effort: Pulmonary effort is normal.     Breath sounds: Normal breath sounds. No wheezing.  Abdominal:     General: Bowel sounds are normal. There is no distension.     Palpations: Abdomen is soft.     Tenderness: There is no abdominal tenderness.  Musculoskeletal:        General: Normal range of motion.     Cervical back: Normal range of motion.     Comments: AKA-Left  Skin:    General: Skin is warm and dry.     Findings: No rash.  Neurological:     Mental Status: He is alert and oriented to person, place, and time.  Psychiatric:        Judgment: Judgment normal.      CMP Latest Ref Rng & Units 06/21/2020  Glucose 70 - 99 mg/dL 207(H)  BUN 6 - 20 mg/dL 9  Creatinine 0.61 - 1.24 mg/dL 0.71  Sodium 135 - 145 mmol/L 136  Potassium 3.5 - 5.1 mmol/L 3.5  Chloride 98 - 111 mmol/L 97(L)  CO2 22 - 32 mmol/L 27  Calcium 8.9 - 10.3 mg/dL 9.7  Total Protein 6.5 - 8.1 g/dL 7.7  Total Bilirubin 0.3 - 1.2 mg/dL 0.7  Alkaline Phos 38 - 126 U/L 90  AST 15 - 41 U/L 30  ALT 0 - 44 U/L 42   CBC Latest Ref Rng & Units 06/21/2020  WBC 4.0 - 10.5 K/uL 7.6  Hemoglobin 13.0 - 17.0 g/dL 15.9  Hematocrit 39 - 52 % 45.4  Platelets 150 - 400 K/uL 229    No images are attached to the encounter.  CT HEAD WO CONTRAST  Result Date: 06/21/2020 CLINICAL DATA:  Right-sided weakness EXAM: CT HEAD WITHOUT CONTRAST TECHNIQUE: Contiguous axial images were obtained from the base of the skull through the vertex without intravenous contrast. COMPARISON:  2018 FINDINGS: Brain: There is no acute intracranial hemorrhage. Hypoattenuation is present in the high left frontal white matter reflecting vasogenic edema. There is mild regional mass effect. Probable underlying 1.3 x 0.6 x 1.6 cm lesion of the left superior  frontal gyrus. Chronic right frontal infarct. Gray-white differentiation is otherwise preserved. There is no extra-axial fluid collection. Ventricles  and sulci are within normal limits in size and configuration. Vascular: No hyperdense vessel or unexpected calcification. Skull: Calvarium is unremarkable. Sinuses/Orbits: No acute finding. Other: None. IMPRESSION: Vasogenic edema in the left frontal lobe associated with a 1.6 cm lesion. This likely reflects a metastasis given lung mass. Contrast enhanced MRI is recommended to exclude additional lesions. No acute intracranial hemorrhage or significant mass effect. Electronically Signed   By: Macy Mis M.D.   On: 06/21/2020 13:09   MR Brain W and Wo Contrast  Result Date: 06/21/2020 CLINICAL DATA:  Brain mass.  Lung mass. EXAM: MRI HEAD WITHOUT AND WITH CONTRAST TECHNIQUE: Multiplanar, multiecho pulse sequences of the brain and surrounding structures were obtained without and with intravenous contrast. CONTRAST:  36m GADAVIST GADOBUTROL 1 MMOL/ML IV SOLN COMPARISON:  CT head 06/21/2020 FINDINGS: Brain: Enhancing mass lesion left medial frontal cortex. The enhancing mass measures 19 x 16 mm and has central necrosis and moderate surrounding vasogenic edema in the white matter. No second mass lesion identified. Chronic infarct right frontal lobe.  No acute infarct or hemorrhage. Vascular: Normal arterial flow voids. Skull and upper cervical spine: No focal skeletal lesion. Sinuses/Orbits: Mild mucosal edema right maxillary sinus. 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 Chronic infarct right frontal lobe. Electronically Signed   By: CFranchot GalloM.D.   On: 06/21/2020 15:15   CT ABDOMEN PELVIS W CONTRAST  Result Date: 06/21/2020 CLINICAL DATA:  Right-sided weakness for 3-4 days. EXAM: CT ABDOMEN AND PELVIS WITH CONTRAST TECHNIQUE: Multidetector CT imaging of the abdomen and pelvis was performed using the standard protocol following bolus administration of intravenous contrast. CONTRAST:  1044mOMNIPAQUE IOHEXOL 300  MG/ML  SOLN COMPARISON:  None. FINDINGS: Lower chest: A 3.1 cm diameter round noncalcified soft tissue mass is seen within the posteromedial aspect of the right lower lobe. Hepatobiliary: There is diffuse fatty infiltration of the liver parenchyma. No focal liver abnormality is seen. A cluster of subcentimeter gallstones are seen within the gallbladder lumen. There is no evidence of gallbladder wall thickening or biliary dilatation. Pancreas: Unremarkable. No pancreatic ductal dilatation or surrounding inflammatory changes. Spleen: Normal in size without focal abnormality. Adrenals/Urinary Tract: Adrenal glands are unremarkable. Kidneys are normal, without renal calculi or hydronephrosis. A 1.9 cm diameter cyst is seen adjacent to the lateral aspect of the mid left kidney. An 8.0 cm x 4.8 cm area of heterogeneous mildly increased attenuation is seen within the dependent portion of the urinary bladder. Stomach/Bowel: Stomach is within normal limits. Appendix appears normal. No evidence of bowel wall thickening, distention, or inflammatory changes. Noninflamed diverticula are seen within the proximal sigmoid colon. Vascular/Lymphatic: There is marked severity calcification of the abdominal aorta. No enlarged abdominal or pelvic lymph nodes. Reproductive: The prostate gland is mildly enlarged. Other: No abdominal wall hernia or abnormality. No abdominopelvic ascites. Musculoskeletal: Degenerative changes seen within the lower lumbar spine at the level of L5-S1. IMPRESSION: 1. 3.1 cm diameter round noncalcified soft tissue mass within the posteromedial aspect of the right lower lobe. Further evaluation with chest CT is recommended, as an underlying neoplasm cannot be excluded. 2. Cholelithiasis. 3. Sigmoid diverticulosis. 4. Large area of heterogeneous increased attenuation within the lumen of the urinary bladder. While this may represent a large amount of blood products, the presence of a urinary bladder mass cannot be  excluded. Aortic Atherosclerosis (ICD10-I70.0). Electronically Signed   By: Virgina Norfolk M.D.   On: 06/21/2020 16:42   CT OUTSIDE FILMS CHEST  Result Date: 06/22/2020 This examination belongs to an outside facility and is stored here for comparison purposes only.  Contact the originating outside institution for any associated report or interpretation.  DG Outside Films Chest  Result Date: 06/22/2020 This examination belongs to an outside facility and is stored here for comparison purposes only.  Contact the originating outside institution for any associated report or interpretation.  Assessment and plan- Patient is a 59 y.o. male who presents to smoking cessation clinic to discuss strategies, medications and interventions needed to quit smoking.  Topics covered: Tobacco proof home and car Change in daily routines Dealing with urges to smoke Getting support Anticipating/avoiding triggers Secondhand smoke Behavioral skills Reinforced benefits  Reviewed pharmacotherapy: OTC medications available include:  Nicotine replacement therapy (NRT) gum  NRT lozenge  NRT patch  NRT patch plus combination of gum or lozenge  Medical treatment:  NRT nasal spray: Dosing 1-2 doses per hour (8-40 doses per day); 1 dose equals 1 spray in each nostril; each spray level 0.5 mg of nicotine  NRT oral inhaler: Dosing 6-16 cartridges per day; initially use 1 cartridge every 1-2 hours  Bupropion SR: Dosing: Begin 1 to 2 weeks prior to quit date; 150 mg by mouth every a.m. x3 days, then increase to 150 mg by mouth twice daily.  Contraindications: Head injury, seizures, eating disorders, MAOI inhibitor.  Verenicline: Dosing: Begin 1 week prior to quit date; days 1 through 3 0.5 mg by mouth every a.m.; days 4 through 7; 0.5 mg p.o. twice daily; weeks 2 through 12: 1 mg p.o. twice daily.  Monitor for neuropsychiatric symptoms.  Follow-up plan:  Based on our discussion today, Tyler Gallagher has decided to  utilize nicotine patch along with NRT oral inhaler.   RX 21 mg/24 hr patch daily (#28 patches) RX Nicotrol 10 mg inhaler  (# 42 cartridges)  Based on our discussion today, your quit date is 06/22/20.   Disposition: RTC on 07/05/2020 for follow-up.  Visit Diagnosis 1. Cigarette nicotine dependence, uncomplicated   2. Personal history of tobacco use, presenting hazards to health     Patient expressed understanding and was in agreement with this plan. He also understands that He can call clinic at any time with any questions, concerns, or complaints.   Greater than 50% was spent in counseling and coordination of care with this patient including but not limited to discussion of the relevant topics above (See A&P) including, but not limited to diagnosis and management of acute and chronic medical conditions.   Thank you for allowing me to participate in the care of this very pleasant patient.    Jacquelin Hawking, NP [place] [facility] Cell - [phone] Pager- [phone] 06/24/2020 12:33 PM

## 2020-06-24 NOTE — Progress Notes (Signed)
Oncology Nurse Navigator Documentation  Navigator Location: [facility]-Med Onc (06/24/20 1300)   )Navigator Encounter Type: Clinic/MDC (06/24/20 1300)               Multidisiplinary Clinic Date: 06/24/20 (06/24/20 1300) Multidisiplinary Clinic Type: Thoracic (06/24/20 1300)   Patient Visit Type: MedOnc (06/24/20 1300)   Barriers/Navigation Needs: Anxiety;Coordination of Care;Morbidities/Frailty;Mobility Issues (06/24/20 1300)   Interventions: Coordination of Care (06/24/20 1300)   Coordination of Care: Appts;Broncoscopy;Radiology (06/24/20 1300)        Acuity: Level 3-Moderate Needs (3-4 Barriers Identified) (06/24/20 1300)      met with patient during initial consultation with [name] to discuss recent imaging and next steps. All questions answered during visit. Reviewed upcoming appts. Informed that I will give him a call to update him on the status of his lung biopsy with [name]. Contact info given and instructed to call with any further questions or needs. Pt verbalized understanding. Nothing further needed at this time.   Time Spent with Patient: 90 (06/24/20 1300)

## 2020-06-27 ENCOUNTER — Telehealth: Payer: Self-pay | Admitting: *Deleted

## 2020-06-27 NOTE — Telephone Encounter (Signed)
Pt called in with elevated blood sugars and wanted to know what he needs to do. Per [name], pt needs to start sliding scale insulin and would like for PCP to manage. Pt scheduled to see Wayland Denis, PA tomorrow morning at Dr. Reuel Boom office. Last MD note faxed to provider. Pt made aware.

## 2020-06-27 NOTE — Progress Notes (Signed)
Hematology/Oncology Consult note [facility] [phone] [phone]  Patient Care Team: Juluis Pitch, MD as PCP - General (Family Medicine) Telford Nab, RN as Oncology Nurse Navigator   Name of the patient: Tyler Gallagher  [id number]  02/27/1961    Reason for referral-lung mass, possible bladder mass, isolated frontal lobe brain metastases   Referring physician-Dr. Lovie Macadamia  Date of visit: 06/27/20   History of presenting illness-patient is a 59 year old male Who has a longstanding history of smoking initially underwent CTA chest with contrast on 06/17/2020 at [facility] when he presented with symptoms of shortness of breath.  CTA showed no pulmonary embolism but a 3.8 x 3.8 cm medial right lower lobe mass abutting the right paravertebral pleural margin.  No involvement of adjacent vertebral bodies and consistent with primary lung malignancy.  He then presented to ER with symptoms of increasing weakness over the right side.  He underwent MRI brain with and without contrast at [facility] which showed a solitary metastatic deposit in the left medial frontal lobe with surrounding vasogenic edema.  Also had a CT abdomen which showed large area of heterogeneous attenuation within the lumen of the urinary bladder.  While this may represent a large amount of blood products the presence of urinary bladder mass not excluded patient was supposed to be seen by neurosurgery and get inpatient lung biopsy but left AMA and went to [facility].  He ultimately left AMA from [facility] as well and has not had any tissue diagnosis so far.  Patient is here with his significant other today.  He has a history of left AKA and therefore relies significantly on his right side of the body for his ADLs.  Reports that his right side has been feeling weaker and overall he feels more fatigued.  Reports ongoing pain in his right shoulder.  Patient is currently not taking any steroids  ECOG PS-  1  Pain scale- 7   Review of systems- Review of Systems  Constitutional: Positive for malaise/fatigue. Negative for chills, fever and weight loss.  HENT: Negative for congestion, ear discharge and nosebleeds.   Eyes: Negative for blurred vision.  Respiratory: Negative for cough, hemoptysis, sputum production, shortness of breath and wheezing.   Cardiovascular: Negative for chest pain, palpitations, orthopnea and claudication.  Gastrointestinal: Negative for abdominal pain, blood in stool, constipation, diarrhea, heartburn, melena, nausea and vomiting.  Genitourinary: Negative for dysuria, flank pain, frequency, hematuria and urgency.  Musculoskeletal: Negative for back pain, joint pain and myalgias.  Skin: Negative for rash.  Neurological: Positive for weakness (Right-sided weakness). Negative for dizziness, tingling, focal weakness, seizures and headaches.  Endo/Heme/Allergies: Does not bruise/bleed easily.  Psychiatric/Behavioral: Negative for depression and suicidal ideas. The patient does not have insomnia.     Allergies  Allergen Reactions  . Penicillins     Patient Active Problem List   Diagnosis Date Noted  . Brain metastasis ([place]) 06/22/2020  . Vasogenic brain edema ([place]) 06/21/2020  . Hyperlipidemia 01/02/2016  . Hypertriglyceridemia 01/02/2016  . Tobacco abuse 01/02/2016  . Type 2 diabetes mellitus without complication, without long-term current use of insulin ([place]) 01/02/2016  . Obesity 01/02/2016  . S/P AKA (above knee amputation) unilateral ([place]) 01/02/2016  . Amaurosis fugax 01/01/2016  . TIA (transient ischemic attack) 01/01/2016     Past Medical History:  Diagnosis Date  . Diabetes mellitus without complication ([place])   . Hyperlipidemia   . Hypertension      Past Surgical History:  Procedure Laterality Date  .  amputation Left 2005   AKA    Social History   Socioeconomic History  . Marital status: Divorced    Spouse name: Not on file  . Number  of children: Not on file  . Years of education: Not on file  . Highest education level: Not on file  Occupational History  . Not on file  Tobacco Use  . Smoking status: Current Every Day Smoker    Packs/day: 2.00    Years: 32.00    Pack years: 64.00    Types: Cigarettes  . Smokeless tobacco: Never Used  Substance and Sexual Activity  . Alcohol use: No    Alcohol/week: 0.0 standard drinks  . Drug use: Yes    Types: Marijuana  . Sexual activity: Not on file  Other Topics Concern  . Not on file  Social History Narrative  . Not on file   Social Determinants of Health   Financial Resource Strain:   . Difficulty of Paying Living Expenses:   Food Insecurity:   . Worried About Charity fundraiser in the Last Year:   . Arboriculturist in the Last Year:   Transportation Needs:   . Film/video editor (Medical):   Marland Kitchen Lack of Transportation (Non-Medical):   Physical Activity:   . Days of Exercise per Week:   . Minutes of Exercise per Session:   Stress:   . Feeling of Stress :   Social Connections:   . Frequency of Communication with Friends and Family:   . Frequency of Social Gatherings with Friends and Family:   . Attends Religious Services:   . Active Member of Clubs or Organizations:   . Attends Archivist Meetings:   Marland Kitchen Marital Status:   Intimate Partner Violence:   . Fear of Current or Ex-Partner:   . Emotionally Abused:   Marland Kitchen Physically Abused:   . Sexually Abused:      Family History  Problem Relation Age of Onset  . Diabetes Sister   . Diabetes Brother      Current Outpatient Medications:  .  atorvastatin (LIPITOR) 10 MG tablet, Take 10 mg by mouth daily., Disp: , Rfl:  .  Cyanocobalamin (RA VITAMIN B-12 TR) 1000 MCG TBCR, Take 1 tablet by mouth daily. , Disp: , Rfl:  .  gabapentin (NEURONTIN) 300 MG capsule, Take 900 mg by mouth 3 (three) times daily., Disp: , Rfl:  .  gemfibrozil (LOPID) 600 MG tablet, Take 0.5 tablets by mouth 2 (two) times  daily., Disp: , Rfl:  .  lisinopril-hydrochlorothiazide (ZESTORETIC) 10-12.5 MG tablet, Take 1 tablet by mouth daily., Disp: , Rfl:  .  metFORMIN (GLUCOPHAGE-XR) 500 MG 24 hr tablet, Take 500 mg by mouth 2 (two) times daily., Disp: , Rfl:  .  pioglitazone (ACTOS) 30 MG tablet, Take 30 mg by mouth daily., Disp: , Rfl:  .  aspirin EC 325 MG tablet, Take 1 tablet (325 mg total) by mouth daily. (Patient not taking: Reported on 06/21/2020), Disp: 30 tablet, Rfl: 0 .  dexamethasone (DECADRON) 4 MG tablet, Take 2 tablets (8 mg total) by mouth 2 (two) times daily., Disp: 60 tablet, Rfl: 1 .  LORazepam (ATIVAN) 0.5 MG tablet, Take 1 tablet (0.5 mg total) by mouth every 8 (eight) hours as needed for anxiety., Disp: 30 tablet, Rfl: 0 .  nicotine (NICODERM CQ - DOSED IN MG/24 HOURS) 21 mg/24hr patch, Place 1 patch (21 mg total) onto the skin daily., Disp: 28 patch, Rfl: 0 .  nicotine (NICOTROL) 10 MG inhaler, Inhale 1 Cartridge (1 continuous puffing total) into the lungs as needed for smoking cessation., Disp: 42 each, Rfl: 0   Physical exam:  Vitals:   06/24/20 0911  BP: (!) 137/105  Pulse: 92  Resp: 16  Temp: (!) 96 F (35.6 C)  TempSrc: Tympanic  SpO2: 98%   Physical Exam Constitutional:      General: He is not in acute distress. Cardiovascular:     Rate and Rhythm: Normal rate and regular rhythm.     Heart sounds: Normal heart sounds.  Pulmonary:     Effort: Pulmonary effort is normal.     Breath sounds: Normal breath sounds.  Abdominal:     General: Bowel sounds are normal.     Palpations: Abdomen is soft.  Musculoskeletal:     Comments: Patient is s/p left AKA.  Strength in right upper and lower extremity 4/ 5  Skin:    General: Skin is warm and dry.  Neurological:     Mental Status: He is alert and oriented to person, place, and time.        CMP Latest Ref Rng & Units 06/21/2020  Glucose 70 - 99 mg/dL 207(H)  BUN 6 - 20 mg/dL 9  Creatinine 0.61 - 1.24 mg/dL 0.71  Sodium 135  - 145 mmol/L 136  Potassium 3.5 - 5.1 mmol/L 3.5  Chloride 98 - 111 mmol/L 97(L)  CO2 22 - 32 mmol/L 27  Calcium 8.9 - 10.3 mg/dL 9.7  Total Protein 6.5 - 8.1 g/dL 7.7  Total Bilirubin 0.3 - 1.2 mg/dL 0.7  Alkaline Phos 38 - 126 U/L 90  AST 15 - 41 U/L 30  ALT 0 - 44 U/L 42   CBC Latest Ref Rng & Units 06/21/2020  WBC 4.0 - 10.5 K/uL 7.6  Hemoglobin 13.0 - 17.0 g/dL 15.9  Hematocrit 39 - 52 % 45.4  Platelets 150 - 400 K/uL 229    No images are attached to the encounter.  CT HEAD WO CONTRAST  Result Date: 06/21/2020 CLINICAL DATA:  Right-sided weakness EXAM: CT HEAD WITHOUT CONTRAST TECHNIQUE: Contiguous axial images were obtained from the base of the skull through the vertex without intravenous contrast. COMPARISON:  2018 FINDINGS: Brain: There is no acute intracranial hemorrhage. Hypoattenuation is present in the high left frontal white matter reflecting vasogenic edema. There is mild regional mass effect. Probable underlying 1.3 x 0.6 x 1.6 cm lesion of the left superior frontal gyrus. Chronic right frontal infarct. Gray-white differentiation is otherwise preserved. There is no extra-axial fluid collection. Ventricles and sulci are within normal limits in size and configuration. Vascular: No hyperdense vessel or unexpected calcification. Skull: Calvarium is unremarkable. Sinuses/Orbits: No acute finding. Other: None. IMPRESSION: Vasogenic edema in the left frontal lobe associated with a 1.6 cm lesion. This likely reflects a metastasis given lung mass. Contrast enhanced MRI is recommended to exclude additional lesions. No acute intracranial hemorrhage or significant mass effect. Electronically Signed   By: Macy Mis M.D.   On: 06/21/2020 13:09   MR Brain W and Wo Contrast  Result Date: 06/21/2020 CLINICAL DATA:  Brain mass.  Lung mass. EXAM: MRI HEAD WITHOUT AND WITH CONTRAST TECHNIQUE: Multiplanar, multiecho pulse sequences of the brain and surrounding structures were obtained without  and with intravenous contrast. CONTRAST:  71mL GADAVIST GADOBUTROL 1 MMOL/ML IV SOLN COMPARISON:  CT head 06/21/2020 FINDINGS: Brain: Enhancing mass lesion left medial frontal cortex. The enhancing mass measures 19 x 16 mm and has  central necrosis and moderate surrounding vasogenic edema in the white matter. No second mass lesion identified. Chronic infarct right frontal lobe.  No acute infarct or hemorrhage. Vascular: Normal arterial flow voids. Skull and upper cervical spine: No focal skeletal lesion. Sinuses/Orbits: Mild mucosal edema right maxillary sinus. 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 Chronic infarct right frontal lobe. Electronically Signed   By: Franchot Gallo M.D.   On: 06/21/2020 15:15   CT ABDOMEN PELVIS W CONTRAST  Result Date: 06/21/2020 CLINICAL DATA:  Right-sided weakness for 3-4 days. EXAM: CT ABDOMEN AND PELVIS WITH CONTRAST TECHNIQUE: Multidetector CT imaging of the abdomen and pelvis was performed using the standard protocol following bolus administration of intravenous contrast. CONTRAST:  126mL OMNIPAQUE IOHEXOL 300 MG/ML  SOLN COMPARISON:  None. FINDINGS: Lower chest: A 3.1 cm diameter round noncalcified soft tissue mass is seen within the posteromedial aspect of the right lower lobe. Hepatobiliary: There is diffuse fatty infiltration of the liver parenchyma. No focal liver abnormality is seen. A cluster of subcentimeter gallstones are seen within the gallbladder lumen. There is no evidence of gallbladder wall thickening or biliary dilatation. Pancreas: Unremarkable. No pancreatic ductal dilatation or surrounding inflammatory changes. Spleen: Normal in size without focal abnormality. Adrenals/Urinary Tract: Adrenal glands are unremarkable. Kidneys are normal, without renal calculi or hydronephrosis. A 1.9 cm diameter cyst is seen adjacent to the lateral aspect of the mid left kidney. An 8.0  cm x 4.8 cm area of heterogeneous mildly increased attenuation is seen within the dependent portion of the urinary bladder. Stomach/Bowel: Stomach is within normal limits. Appendix appears normal. No evidence of bowel wall thickening, distention, or inflammatory changes. Noninflamed diverticula are seen within the proximal sigmoid colon. Vascular/Lymphatic: There is marked severity calcification of the abdominal aorta. No enlarged abdominal or pelvic lymph nodes. Reproductive: The prostate gland is mildly enlarged. Other: No abdominal wall hernia or abnormality. No abdominopelvic ascites. Musculoskeletal: Degenerative changes seen within the lower lumbar spine at the level of L5-S1. IMPRESSION: 1. 3.1 cm diameter round noncalcified soft tissue mass within the posteromedial aspect of the right lower lobe. Further evaluation with chest CT is recommended, as an underlying neoplasm cannot be excluded. 2. Cholelithiasis. 3. Sigmoid diverticulosis. 4. Large area of heterogeneous increased attenuation within the lumen of the urinary bladder. While this may represent a large amount of blood products, the presence of a urinary bladder mass cannot be excluded. Aortic Atherosclerosis (ICD10-I70.0). Electronically Signed   By: Virgina Norfolk M.D.   On: 06/21/2020 16:42   CT OUTSIDE FILMS CHEST  Result Date: 06/22/2020 This examination belongs to an outside facility and is stored here for comparison purposes only.  Contact the originating outside institution for any associated report or interpretation.  DG Outside Films Chest  Result Date: 06/22/2020 This examination belongs to an outside facility and is stored here for comparison purposes only.  Contact the originating outside institution for any associated report or interpretation.   Assessment and plan- Patient is a 59 y.o. male with newly diagnosed right lung mass, possible bladder mass and solitary left frontal brain metastases  1.  Brain metastases: I will  start him on Decadron 8 mg twice daily.  I also spoke with Dr. [name] from neurosurgery.  Dr. [name] would like an initial tissue diagnosis from his long-term abdomen if this is small cell versus non-small cell lung cancer to decide if he needs surgery for his brain versus palliative radiation  treatment.  Also given patient's baseline performance status it is unclear if he would be a candidate for definitive resection of his brain metastases.  Patient is s/p left AKA and relies on his right side for his ambulation and ADLs.  It is unclear if surgery may be leaving him with any residual deficits.   2.  Lung mass: He will be getting PET CT scan next week.  Patient is also seeing [name] who has agreed to do a bronchoscopy for definitive tissue diagnosis without the need for a PET scan.  We also confirmed with IR that this would not be amenable to a CT-guided biopsy.  3.  Possible bladder mass: Likely an unrelated second primary.  Patient has seen Dr. Erlene Quan and will need cystoscopy as an outpatient.  She sees him next month  4.  Anxiety: I will start him on Ativan 0.5 mg every 8 hours as needed  5.  Patient will also be referred for smoking cessation counseling  I will see him back in 10 days to discuss final pathology and further management.  He will also see Dr. Donella Stade on the same day he sees me   Total face to face encounter time for this patient visit was 40 min.  Time spent in reviewing outside records as well as discussing patients case with Dr. [name] neurosurgery follow-up  Visit Diagnosis 1. Lung mass   2. Brain lesion   3. Bladder mass     Dr. Randa Evens, MD, MPH [facility] at [facility] [phone] 06/27/2020 9:48 AM

## 2020-06-28 ENCOUNTER — Other Ambulatory Visit: Payer: Self-pay | Admitting: Pulmonary Disease

## 2020-06-28 DIAGNOSIS — T50905A Adverse effect of unspecified drugs, medicaments and biological substances, initial encounter: Secondary | ICD-10-CM | POA: Diagnosis not present

## 2020-06-28 DIAGNOSIS — F17200 Nicotine dependence, unspecified, uncomplicated: Secondary | ICD-10-CM | POA: Diagnosis not present

## 2020-06-28 DIAGNOSIS — R918 Other nonspecific abnormal finding of lung field: Secondary | ICD-10-CM

## 2020-06-28 DIAGNOSIS — Z7952 Long term (current) use of systemic steroids: Secondary | ICD-10-CM | POA: Diagnosis not present

## 2020-06-28 DIAGNOSIS — N3289 Other specified disorders of bladder: Secondary | ICD-10-CM | POA: Diagnosis not present

## 2020-06-28 DIAGNOSIS — G939 Disorder of brain, unspecified: Secondary | ICD-10-CM | POA: Diagnosis not present

## 2020-06-28 DIAGNOSIS — E1165 Type 2 diabetes mellitus with hyperglycemia: Secondary | ICD-10-CM | POA: Diagnosis not present

## 2020-06-29 ENCOUNTER — Other Ambulatory Visit
Admission: RE | Admit: 2020-06-29 | Discharge: 2020-06-29 | Disposition: A | Discharge status: Home / Self Care | Payer: Medicare HMO | Source: Ambulatory Visit | Attending: Pulmonary Disease | Admitting: Pulmonary Disease

## 2020-06-29 ENCOUNTER — Encounter
Admission: RE | Admit: 2020-06-29 | Discharge: 2020-06-29 | Disposition: A | Discharge status: Home / Self Care | Payer: Medicare HMO | Source: Ambulatory Visit | Attending: Oncology | Admitting: Oncology

## 2020-06-29 ENCOUNTER — Other Ambulatory Visit: Payer: Self-pay

## 2020-06-29 DIAGNOSIS — C801 Malignant (primary) neoplasm, unspecified: Secondary | ICD-10-CM | POA: Diagnosis not present

## 2020-06-29 DIAGNOSIS — C7931 Secondary malignant neoplasm of brain: Secondary | ICD-10-CM | POA: Diagnosis not present

## 2020-06-29 DIAGNOSIS — I2510 Atherosclerotic heart disease of native coronary artery without angina pectoris: Secondary | ICD-10-CM | POA: Insufficient documentation

## 2020-06-29 DIAGNOSIS — I700 Atherosclerosis of aorta: Secondary | ICD-10-CM | POA: Diagnosis not present

## 2020-06-29 DIAGNOSIS — J320 Chronic maxillary sinusitis: Secondary | ICD-10-CM | POA: Diagnosis not present

## 2020-06-29 DIAGNOSIS — J439 Emphysema, unspecified: Secondary | ICD-10-CM | POA: Insufficient documentation

## 2020-06-29 DIAGNOSIS — R918 Other nonspecific abnormal finding of lung field: Secondary | ICD-10-CM

## 2020-06-29 DIAGNOSIS — Z89612 Acquired absence of left leg above knee: Secondary | ICD-10-CM | POA: Insufficient documentation

## 2020-06-29 DIAGNOSIS — K8020 Calculus of gallbladder without cholecystitis without obstruction: Secondary | ICD-10-CM | POA: Insufficient documentation

## 2020-06-29 HISTORY — DX: Disorder of brain, unspecified: G93.9

## 2020-06-29 HISTORY — DX: Other nonspecific abnormal finding of lung field: R91.8

## 2020-06-29 LAB — GLUCOSE, CAPILLARY: Glucose-Capillary: 164 mg/dL — ABNORMAL HIGH (ref 70–99)

## 2020-06-29 MED ORDER — FLUDEOXYGLUCOSE F - 18 (FDG) INJECTION
13.5000 | Freq: Once | INTRAVENOUS | Status: AC | PRN
  Administered 2020-06-29: 14.1 via INTRAVENOUS

## 2020-06-29 NOTE — Pre-Procedure Instructions (Signed)
Pt had a question about stopping metformin before surgery. He was concerned that blood sugar would increase too much. Dr Bertell Maria gave instruction that the patient needs to be off metformin 48 hours. Pt notified that he should not take any more metformin today and none tomorrow. Pt voiced understanding. A copy of instructions will be given to patient at covid testing appt tomorrow.

## 2020-06-29 NOTE — Patient Instructions (Signed)
COVID TESTING Date: June 30, 2020 Testing site:  [facility] [address] Thru Hours:  6:71 am - 1:00 pm Once you are tested, you are asked to stay quarantined (avoiding public places) until after your surgery.   Your procedure is scheduled on: July 01, 2020 Friday  Report to Day Surgery on the 2nd floor of the [place]. To find out your arrival time, please call [phone] between 1PM - 3PM on: Thursday June 30, 2020  REMEMBER: Instructions that are not followed completely may result in serious medical risk, up to and including death; or upon the discretion of your surgeon and anesthesiologist your surgery may need to be rescheduled.  Do not eat food after midnight the night before surgery.  No gum chewing, lozengers or hard candies.  You may however, drink CLEAR liquids up to 2 hours before you are scheduled to arrive for your surgery. Do not drink anything within 2 hours of your scheduled arrival time.  Clear liquids include: - water   Do NOT drink anything that is not on this list.  Type 1 and Type 2 diabetics should only drink water.   TAKE THESE MEDICATIONS THE MORNING OF SURGERY WITH A SIP OF WATER: atorvastation gabapentin dexamethsone    Stop Metformin as instructed per MD  Take 1/2 of usual insulin dose the night before surgery and none on the morning of surgery.  Follow recommendations from Cardiologist, Pulmonologist or PCP regarding stopping Aspirin, Coumadin, Plavix, Eliquis, Pradaxa, or Pletal.  Stop Anti-inflammatories (NSAIDS) such as Advil, Aleve, Ibuprofen, Motrin, Naproxen, Naprosyn and Aspirin based products such as Excedrin, Goodys Powder, BC Powder. (May take Tylenol or Acetaminophen if needed.)  Stop ANY OVER THE COUNTER supplements until after surgery. (May continue Vitamin D, Vitamin B, and multivitamin.)  No Alcohol for 24 hours before or after surgery.  No Smoking including e-cigarettes for 24 hours prior  to surgery.  No chewable tobacco products for at least 6 hours prior to surgery.  No nicotine patches on the day of surgery.  Do not use any "recreational" drugs for at least a week prior to your surgery.  Please be advised that the combination of cocaine and anesthesia may have negative outcomes, up to and including death. If you test positive for cocaine, your surgery will be cancelled.  On the morning of surgery brush your teeth with toothpaste and water, you may rinse your mouth with mouthwash if you wish. Do not swallow any toothpaste or mouthwash.  Do not wear jewelry, make-up, hairpins, clips or nail polish.  Do not wear lotions, powders, or perfumes.   Do not shave 48 hours prior to surgery.   Contact lenses, hearing aids and dentures may not be worn into surgery.  Do not bring valuables to the hospital. [facility] is not responsible for any missing/lost belongings or valuables.   [place] morning of surgery.  Notify your doctor if there is any change in your medical condition (cold, fever, infection).  Wear comfortable clothing (specific to your surgery type) to the hospital.  Plan for stool softeners for home use; pain medications have a tendency to cause constipation. You can also help prevent constipation by eating foods high in fiber such as fruits and vegetables and drinking plenty of fluids as your diet allows.  After surgery, you can help prevent lung complications by doing breathing exercises.  Take deep breaths and cough every 1-2 hours. Your doctor may order a device called an Chiropodist  to help you take deep breaths. When coughing or sneezing, hold a pillow firmly against your incision with both hands. This is called "splinting." Doing this helps protect your incision. It also decreases belly discomfort.  If you are being admitted to the hospital overnight, leave your suitcase in the car. After surgery it may be brought to your room.  If you are being  discharged the day of surgery, you will not be allowed to drive home. You will need a responsible adult (18 years or older) to drive you home and stay with you that night.   If you are taking public transportation, you will need to have a responsible adult (18 years or older) with you. Please confirm with your physician that it is acceptable to use public transportation.   Please call the [place] Dept. at [phone] if you have any questions about these instructions.  Visitation Policy:  Patients undergoing a surgery or procedure may have one family member or support person with them as long as that person is not COVID-19 positive or experiencing its symptoms.  That person may remain in the waiting area during the procedure.  Children under 97 years of age may have both parents or legal guardians with them during their procedure.  Inpatient Visitation Update:   Two designated support people may visit a patient during visiting hours 7 am to 8 pm. It must be the same two designated people for the duration of the patient stay. The visitors may come and go during the day, and there is no switching out to have different visitors. A mask must be worn at all times, including in the patient room.  Children under 69 years of age:  a total of 4 designated visitors for the child's entire stay are allowed. Only 2 in the room at a time and only one staying overnight at a time. The overnight guest can now rotate during the child's hospital stay.  As a reminder, masks are still required for all [place] team members, patients and visitors in all [place] facilities.   Systemwide, no visitors 17 or younger.

## 2020-06-30 ENCOUNTER — Other Ambulatory Visit: Payer: Self-pay

## 2020-06-30 ENCOUNTER — Other Ambulatory Visit
Admission: RE | Admit: 2020-06-30 | Discharge: 2020-06-30 | Disposition: A | Discharge status: Home / Self Care | Payer: Medicare HMO | Source: Ambulatory Visit | Attending: Pulmonary Disease | Admitting: Pulmonary Disease

## 2020-06-30 ENCOUNTER — Ambulatory Visit
Admission: RE | Admit: 2020-06-30 | Discharge: 2020-06-30 | Disposition: A | Discharge status: Home / Self Care | Payer: Medicare HMO | Source: Ambulatory Visit | Attending: Pulmonary Disease | Admitting: Pulmonary Disease

## 2020-06-30 DIAGNOSIS — K760 Fatty (change of) liver, not elsewhere classified: Secondary | ICD-10-CM | POA: Insufficient documentation

## 2020-06-30 DIAGNOSIS — Z01818 Encounter for other preprocedural examination: Secondary | ICD-10-CM | POA: Insufficient documentation

## 2020-06-30 DIAGNOSIS — Z20822 Contact with and (suspected) exposure to covid-19: Secondary | ICD-10-CM | POA: Insufficient documentation

## 2020-06-30 DIAGNOSIS — J439 Emphysema, unspecified: Secondary | ICD-10-CM | POA: Diagnosis not present

## 2020-06-30 DIAGNOSIS — I700 Atherosclerosis of aorta: Secondary | ICD-10-CM | POA: Diagnosis not present

## 2020-06-30 DIAGNOSIS — I712 Thoracic aortic aneurysm, without rupture: Secondary | ICD-10-CM | POA: Diagnosis not present

## 2020-06-30 DIAGNOSIS — R918 Other nonspecific abnormal finding of lung field: Secondary | ICD-10-CM | POA: Diagnosis not present

## 2020-06-30 DIAGNOSIS — K8020 Calculus of gallbladder without cholecystitis without obstruction: Secondary | ICD-10-CM | POA: Diagnosis not present

## 2020-06-30 LAB — SARS CORONAVIRUS 2 (TAT 6-24 HRS): SARS Coronavirus 2: NEGATIVE

## 2020-06-30 LAB — PROTIME-INR
INR: 0.9 (ref 0.8–1.2)
Prothrombin Time: 11.4 seconds (ref 11.4–15.2)

## 2020-06-30 LAB — APTT: aPTT: <24 seconds — ABNORMAL LOW (ref 24–36)

## 2020-06-30 NOTE — Progress Notes (Signed)
[facility] Perioperative Services  Pre-Admission/Anesthesia Testing Clinical Review  Date: 06/30/20  Patient Demographics:  Name: Tyler Gallagher DOB:   21-May-1961 MRN:   [id number]  Planned Surgical Procedure(s):    Case: [id number] Date/Time: 07/01/20 0715   Procedures:      VIDEO BRONCHOSCOPY WITH ENDOBRONCHIAL ULTRASOUND (N/A )     VIDEO BRONCHOSCOPY WITH ENDOBRONCHIAL NAVIGATION (N/A )   Anesthesia type: General   Pre-op diagnosis: Lung Mass   Location: [place] PROCEDURE RM 02 / [facility] ORS FOR ANESTHESIA GROUP   Surgeons: [name], MD     NOTE: Available PAT nursing documentation and vital signs have been reviewed. Clinical nursing staff has updated patient's PMH/PSHx, current medication list, and drug allergies/intolerances to ensure comprehensive history available to assist in medical decision making as it pertains to the aforementioned surgical procedure and anticipated anesthetic course.   Clinical Discussion:  Tyler Gallagher is a 59 y.o. male who is submitted for pre-surgical anesthesia review and clearance prior to him undergoing the above procedure. Patient is a Current Smoker. (+) marijuana use. Pertinent PMH includes: HTN, HLD, T2DM, TIA, lung mass with (+) brain metastasis (pending pathology for type/staging), vasogenic edema, LEFT AKA.   Patient under the care of oncology Janese Banks, MD) for lung cancer. Diagnosis (type and staging) has yet to be determined; pending planned EBUS/ENB for tissue sampling. Patient was last seen in the office on 06/24/2020; notes reviewed. Patient with CT imaging that demonstrated a 3.8 x 3.8 cm medial right lower lobe mass abutting the right paravertebral pleural margin. MRI imaging of the brain revealed a solitary metastatic deposit in the left medial frontal lobe with surrounding vasogenic edema. PET scan revealed that RLL pulmonary lesion was hypermetabolic (SUV 58.5). Discussed need for biopsy. Of note, radiologist  advised that "the patient has a known likely metastatic lesion to the left frontal vertex of the brain, a level not included on today's PET-CT". Patient was placed on high dose dexamethasone (8 mg BID), which has caused his CBGs to elevate. Patient was referred to his PCP for management.   Patient seen by PCP Eulas Post, PA-C) on 06/28/2020; notes reviewed. Patient has been prescribed Metformin 500 mg BID, however notes that he has only been taking 250 mg BID. PA noted history of medication non-compliance. Last Hgb A1c was 7.7% on 06/21/2020. With the systemic steroids being used for his vasogenic edema, patient advising that CBGs had been running 375-420. Patient reported that he is only taking half of the prescribed steroid dose; actually taking 4 mg BID despite order for 8 mg BID. PA increased Metformin to 1000 mg BID and plans to see back in short term follow up. Patient advised to HOLD during PAT phone interview, however despite education, it is unclear if his is going to be compliant.    He denies previous intra-operative complications with anesthesia. This patient is on daily antiplatelet therapy. He has been instructed on recommendations for holding his ASA prior to his procedure; last dose will be on 06/29/2020.   Vitals with BMI 06/29/2020 06/24/2020 06/22/2020  Height 6\' 4"  - -  Weight 265 lbs - -  BMI 27.78 - -  Systolic - 242 353  Diastolic - 614 82  Pulse - 92 92    Providers/Specialists:   PROVIDER ROLE LAST Charlynn Grimes, MD Pulmonology (Surgeon) 06/22/2020  Juluis Pitch, MD Primary Care Provider 06/28/2020  Randa Evens, MD Oncology 06/24/2020   Allergies:  Penicillins  Current Home Medications:  No current facility-administered medications for this encounter.   Marland Kitchen aspirin EC 325 MG tablet  . atorvastatin (LIPITOR) 10 MG tablet  . Cyanocobalamin (RA VITAMIN B-12 TR) 1000 MCG TBCR  . dexamethasone (DECADRON) 4 MG tablet  . gabapentin (NEURONTIN) 300 MG capsule  .  gemfibrozil (LOPID) 600 MG tablet  . lisinopril-hydrochlorothiazide (ZESTORETIC) 10-12.5 MG tablet  . LORazepam (ATIVAN) 0.5 MG tablet  . metFORMIN (GLUCOPHAGE-XR) 500 MG 24 hr tablet  . nicotine (NICODERM CQ - DOSED IN MG/24 HOURS) 21 mg/24hr patch  . nicotine (NICOTROL) 10 MG inhaler  . pioglitazone (ACTOS) 30 MG tablet   History:   Past Medical History:  Diagnosis Date  . Diabetes mellitus without complication ([place])   . Hyperlipidemia   . Hypertension   . Lesion of brain   . Mass of lung    Past Surgical History:  Procedure Laterality Date  . 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 COLONOSCOPY     Family History  Problem Relation Age of Onset  . Diabetes Sister   . Diabetes Brother    Social History   Tobacco Use  . Smoking status: Current Every Day Smoker    Packs/day: 2.00    Years: 32.00    Pack years: 64.00    Types: Cigarettes  . Smokeless tobacco: Never Used  . Tobacco comment: trying to stop - not smoked in two weeks  Vaping Use  . Vaping Use: Never used  Substance Use Topics  . Alcohol use: No    Alcohol/week: 0.0 standard drinks  . Drug use: Yes    Types: Marijuana    Comment: two weeks    Pertinent Clinical Results:  LABS: Labs reviewed: Acceptable for surgery.  Hospital Outpatient Visit on 06/30/2020  Component Date Value Ref Range Status  . Prothrombin Time 06/30/2020 11.4  11.4 - 15.2 seconds Final  . INR 06/30/2020 0.9  0.8 - 1.2 Final   Comment: (NOTE) INR goal varies based on device and disease states. Performed at [facility], [address], [place], [place] [address]   Hospital Outpatient Visit on 06/29/2020  Component Date Value Ref Range Status  . Glucose-Capillary 06/29/2020 164* 70 - 99 mg/dL Final   Glucose reference range applies only to samples taken after fasting for at least 8 hours.  Admission on 06/21/2020, Discharged on 06/22/2020  Component Date Value Ref Range Status  . Prothrombin Time 06/21/2020 12.6  11.4 -  15.2 seconds Final  . INR 06/21/2020 1.0  0.8 - 1.2 Final   Comment: (NOTE) INR goal varies based on device and disease states. Performed at [facility], [address], [place], [place] [address]  . aPTT 06/21/2020 26  24 - 36 seconds Final   Performed at [facility], [address] [place], [place] [address]  . WBC 06/21/2020 7.6  4.0 - 10.5 K/uL Final  . RBC 06/21/2020 5.18  4.22 - 5.81 MIL/uL Final  . Hemoglobin 06/21/2020 15.9  13.0 - 17.0 g/dL Final  . HCT 06/21/2020 45.4  39 - 52 % Final  . MCV 06/21/2020 87.6  80.0 - 100.0 fL Final  . MCH 06/21/2020 30.7  26.0 - 34.0 pg Final  . MCHC 06/21/2020 35.0  30.0 - 36.0 g/dL Final  . RDW 06/21/2020 13.4  11.5 - 15.5 % Final  . Platelets 06/21/2020 229  150 - 400 K/uL Final  . nRBC 06/21/2020 0.0  0.0 - 0.2 % Final   Performed at [facility],  [address] [place], [place] [address]  . Neutrophils Relative % 06/21/2020 62  % Final  . Neutro Abs 06/21/2020 4.7  1.7 - 7.7 K/uL Final  . Lymphocytes Relative 06/21/2020 28  % Final  . Lymphs Abs 06/21/2020 2.1  0.7 - 4.0 K/uL Final  . Monocytes Relative 06/21/2020 6  % Final  . Monocytes Absolute 06/21/2020 0.5  0 - 1 K/uL Final  . Eosinophils Relative 06/21/2020 3  % Final  . Eosinophils Absolute 06/21/2020 0.2  0 - 0 K/uL Final  . Basophils Relative 06/21/2020 1  % Final  . Basophils Absolute 06/21/2020 0.1  0 - 0 K/uL Final  . Immature Granulocytes 06/21/2020 0  % Final  . Abs Immature Granulocytes 06/21/2020 0.02  0.00 - 0.07 K/uL Final   Performed at [facility], [address], [place], [place] [address]  . Sodium 06/21/2020 136  135 - 145 mmol/L Final  . Potassium 06/21/2020 3.5  3.5 - 5.1 mmol/L Final  . Chloride 06/21/2020 97* 98 - 111 mmol/L Final  . CO2 06/21/2020 27  22 - 32 mmol/L Final  . Glucose, Bld 06/21/2020 207* 70 - 99 mg/dL Final   Glucose reference range applies only to samples taken after fasting for at least 8  hours.  . BUN 06/21/2020 9  6 - 20 mg/dL Final  . Creatinine, Ser 06/21/2020 0.71  0.61 - 1.24 mg/dL Final  . Calcium 06/21/2020 9.7  8.9 - 10.3 mg/dL Final  . Total Protein 06/21/2020 7.7  6.5 - 8.1 g/dL Final  . Albumin 06/21/2020 4.2  3.5 - 5.0 g/dL Final  . AST 06/21/2020 30  15 - 41 U/L Final  . ALT 06/21/2020 42  0 - 44 U/L Final  . Alkaline Phosphatase 06/21/2020 90  38 - 126 U/L Final  . Total Bilirubin 06/21/2020 0.7  0.3 - 1.2 mg/dL Final  . GFR calc non Af Amer 06/21/2020 >60  >60 mL/min Final  . GFR calc Af Amer 06/21/2020 >60  >60 mL/min Final  . Anion gap 06/21/2020 12  5 - 15 Final   Performed at [facility], [address], [place], [place] [address]  . Troponin I (High Sensitivity) 06/21/2020 4  <18 ng/L Final  . Troponin I (High Sensitivity) 06/21/2020 5  <18 ng/L Final   Performed at [facility], [place] [address] [place], [place] [address]  . Hgb A1c MFr Bld 06/21/2020 7.7* 4.8 - 5.6 % Final   Comment: (NOTE)         Prediabetes: 5.7 - 6.4         Diabetes: >6.4         Glycemic control for adults with diabetes: <7.0   EKG: Date: 06/21/2020 Time ECG obtained: 1155 AM Rate: 91 bpm Rhythm:  NSR with IRBBB Axis (leads I and aVF): Normal Intervals: PR 142 ms. QTc 494 ms. ST segment and T wave changes: Non-specific ST changes Comparison: QTc has prolonged since 02/08/2017 tracing.   IMAGING / PROCEDURES: PET SCAN SKULL TO BASE OF THIGH done on 06/29/2020 1. 3.7 cm right lower lobe mass is highly hypermetabolic with maximum SUV 13.7, compatible with malignancy. 2. No hypermetabolic adenopathy or evidence of metastatic disease to the abdomen/pelvis or skeleton. The patient has a known likely metastatic lesion to the left frontal vertex of the brain, a level not included on today's PET-CT. 3. Other imaging findings of potential clinical significance: Chronic right maxillary sinusitis. Aortic atherosclerosis and emphysema .  Coronary atherosclerosis.  Airway thickening is present, suggesting bronchitis or reactive airways disease. Cholelithiasis. 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  HEAD CT W/O CONTRAST done on 06/21/2020 1. Vasogenic edema in the left frontal lobe associated with a 1.6 cm lesion. This likely reflects a metastasis given lung mass. Contrast enhanced MRI is recommended to exclude additional lesions.  MRI BRAIN W/WO CONTRAST done on 06/21/2020 1. Solitary metastatic deposit in the left medial frontal lobe with surrounding vasogenic edema.  CT ABD/PELVIS WITH CONTRAST done on 06/21/2020 1. 3.1 cm diameter round noncalcified soft tissue mass within the posteromedial aspect of the right lower lobe. Further evaluation with chest CT is recommended, as an underlying neoplasm cannot be excluded. 2. Cholelithiasis. 3. Sigmoid diverticulosis. 4. Large area of heterogeneous increased attenuation within the lumen of the urinary bladder. While this may represent a large amount of blood products, the presence of a urinary bladder mass cannot be excluded. 5. Chronic infarct right frontal lobe. 6. No acute intracranial hemorrhage or significant mass effect.  ECHOCARDIOGRAM done on 01/02/2016 1. Transthoracic echocardiography. The study was technically difficult. 2. Left ventricle: Systolic function was normal. The estimated ejection fraction was in the range of 55% to 65%. 3. Aortic valve: Valve area (Vmax): 3.01 cm^2 and peak gradient of 4 mmHg  Impression and Plan:  Tyler Gallagher has been referred for pre-anesthesia review and clearance prior him undergoing the planned anesthetic and procedural courses. Available labs, pertinent testing, and imaging results were personally reviewed by me. This patient has been appropriately cleared by oncology and pulmonology.  Of note, patient has been instructed to HOLD his Metformin. He was educated by the RN during his  PAT interview that continuing to take this medication could cause intra/post-operative metabolic complications; increased risk of lactic acidosis. Patient resistant and initially expressed that he was not willing to hold as recommended. I discussed this with anesthesiology on call Bertell Maria, MD) who confirmed the aforementioned and recommended further patient education be completed and documented. At the time that the RN spoke with the patient on on 06/29/2020, he had already taken his morning dose of Metformin. He was advised to hold second dose on 06/29/2020, both doses on 06/30/2020, and morning dose on the day of his procedure (07/01/2020).   Based on clinical review performed today (06/30/20), barring and significant acute changes in the patient's overall condition, it is anticipated that he will be able to proceed with the planned surgical intervention. Any acute changes in clinical condition may necessitate his procedure being postponed and/or cancelled. Pre-surgical instructions were reviewed with the patient during his PAT appointment and questions were fielded by PAT clinical staff.  Honor Loh, MSN, APRN, FNP-C, CEN [facility]  Peri-operative Services Nurse Practitioner Phone: [phone] 06/30/20 12:22 PM  NOTE: This note has been prepared using Dragon dictation software. Despite my best ability to proofread, there is always the potential that unintentional transcriptional errors may still occur from this process.

## 2020-07-01 ENCOUNTER — Encounter
Admission: RE | Disposition: A | Discharge status: Home / Self Care | Payer: Self-pay | Source: Home / Self Care | Attending: Pulmonary Disease

## 2020-07-01 ENCOUNTER — Ambulatory Visit
Admission: RE | Admit: 2020-07-01 | Discharge: 2020-07-01 | Disposition: A | Discharge status: Home / Self Care | Payer: Medicare HMO | Attending: Pulmonary Disease | Admitting: Pulmonary Disease

## 2020-07-01 ENCOUNTER — Ambulatory Visit: Payer: Medicare HMO | Admitting: Urgent Care

## 2020-07-01 ENCOUNTER — Other Ambulatory Visit: Payer: Self-pay

## 2020-07-01 ENCOUNTER — Ambulatory Visit: Payer: Medicare HMO

## 2020-07-01 DIAGNOSIS — Z8673 Personal history of transient ischemic attack (TIA), and cerebral infarction without residual deficits: Secondary | ICD-10-CM | POA: Diagnosis not present

## 2020-07-01 DIAGNOSIS — Z7984 Long term (current) use of oral hypoglycemic drugs: Secondary | ICD-10-CM | POA: Insufficient documentation

## 2020-07-01 DIAGNOSIS — E1151 Type 2 diabetes mellitus with diabetic peripheral angiopathy without gangrene: Secondary | ICD-10-CM | POA: Diagnosis not present

## 2020-07-01 DIAGNOSIS — E785 Hyperlipidemia, unspecified: Secondary | ICD-10-CM | POA: Insufficient documentation

## 2020-07-01 DIAGNOSIS — Z89612 Acquired absence of left leg above knee: Secondary | ICD-10-CM | POA: Insufficient documentation

## 2020-07-01 DIAGNOSIS — Z79899 Other long term (current) drug therapy: Secondary | ICD-10-CM | POA: Insufficient documentation

## 2020-07-01 DIAGNOSIS — T383X6A Underdosing of insulin and oral hypoglycemic [antidiabetic] drugs, initial encounter: Secondary | ICD-10-CM | POA: Insufficient documentation

## 2020-07-01 DIAGNOSIS — C3431 Malignant neoplasm of lower lobe, right bronchus or lung: Secondary | ICD-10-CM | POA: Insufficient documentation

## 2020-07-01 DIAGNOSIS — R590 Localized enlarged lymph nodes: Secondary | ICD-10-CM | POA: Insufficient documentation

## 2020-07-01 DIAGNOSIS — C3491 Malignant neoplasm of unspecified part of right bronchus or lung: Secondary | ICD-10-CM | POA: Diagnosis not present

## 2020-07-01 DIAGNOSIS — Z7952 Long term (current) use of systemic steroids: Secondary | ICD-10-CM | POA: Diagnosis not present

## 2020-07-01 DIAGNOSIS — J449 Chronic obstructive pulmonary disease, unspecified: Secondary | ICD-10-CM | POA: Diagnosis not present

## 2020-07-01 DIAGNOSIS — Z91128 Patient's intentional underdosing of medication regimen for other reason: Secondary | ICD-10-CM | POA: Insufficient documentation

## 2020-07-01 DIAGNOSIS — E119 Type 2 diabetes mellitus without complications: Secondary | ICD-10-CM | POA: Diagnosis not present

## 2020-07-01 DIAGNOSIS — Z880 Allergy status to penicillin: Secondary | ICD-10-CM | POA: Insufficient documentation

## 2020-07-01 DIAGNOSIS — Z7982 Long term (current) use of aspirin: Secondary | ICD-10-CM | POA: Diagnosis not present

## 2020-07-01 DIAGNOSIS — F17210 Nicotine dependence, cigarettes, uncomplicated: Secondary | ICD-10-CM | POA: Diagnosis not present

## 2020-07-01 DIAGNOSIS — R918 Other nonspecific abnormal finding of lung field: Secondary | ICD-10-CM | POA: Diagnosis not present

## 2020-07-01 DIAGNOSIS — I10 Essential (primary) hypertension: Secondary | ICD-10-CM | POA: Insufficient documentation

## 2020-07-01 DIAGNOSIS — C7931 Secondary malignant neoplasm of brain: Secondary | ICD-10-CM | POA: Insufficient documentation

## 2020-07-01 HISTORY — PX: VIDEO BRONCHOSCOPY WITH ENDOBRONCHIAL NAVIGATION: SHX6175

## 2020-07-01 HISTORY — PX: VIDEO BRONCHOSCOPY WITH ENDOBRONCHIAL ULTRASOUND: SHX6177

## 2020-07-01 LAB — URINE DRUG SCREEN, QUALITATIVE (ARMC ONLY)
Amphetamines, Ur Screen: NOT DETECTED
Barbiturates, Ur Screen: NOT DETECTED
Benzodiazepine, Ur Scrn: NOT DETECTED
Cannabinoid 50 Ng, Ur ~~LOC~~: NOT DETECTED
Cocaine Metabolite,Ur ~~LOC~~: NOT DETECTED
MDMA (Ecstasy)Ur Screen: NOT DETECTED
Methadone Scn, Ur: NOT DETECTED
Opiate, Ur Screen: NOT DETECTED
Phencyclidine (PCP) Ur S: NOT DETECTED
Tricyclic, Ur Screen: NOT DETECTED

## 2020-07-01 LAB — GLUCOSE, CAPILLARY
Glucose-Capillary: 158 mg/dL — ABNORMAL HIGH (ref 70–99)
Glucose-Capillary: 165 mg/dL — ABNORMAL HIGH (ref 70–99)

## 2020-07-01 SURGERY — BRONCHOSCOPY, WITH EBUS
Anesthesia: General

## 2020-07-01 MED ORDER — SODIUM CHLORIDE 0.9 % IV SOLN: INTRAVENOUS | Status: DC

## 2020-07-01 MED ORDER — MIDAZOLAM HCL 2 MG/2ML IJ SOLN
INTRAMUSCULAR | Status: AC
  Filled 2020-07-01: qty 2

## 2020-07-01 MED ORDER — SUCCINYLCHOLINE CHLORIDE 200 MG/10ML IV SOSY
PREFILLED_SYRINGE | INTRAVENOUS | Status: AC
  Filled 2020-07-01: qty 10

## 2020-07-01 MED ORDER — OXYCODONE HCL 5 MG PO TABS: 5.0000 mg | ORAL_TABLET | Freq: Once | ORAL | Status: DC | PRN

## 2020-07-01 MED ORDER — FENTANYL CITRATE (PF) 100 MCG/2ML IJ SOLN
INTRAMUSCULAR | Status: AC
  Filled 2020-07-01: qty 2

## 2020-07-01 MED ORDER — CHLORHEXIDINE GLUCONATE 0.12 % MT SOLN
OROMUCOSAL | Status: AC
  Administered 2020-07-01: 15 mL via OROMUCOSAL
  Filled 2020-07-01: qty 15

## 2020-07-01 MED ORDER — ONDANSETRON HCL 4 MG/2ML IJ SOLN: 4.0000 mg | Freq: Once | INTRAMUSCULAR | Status: DC | PRN

## 2020-07-01 MED ORDER — FENTANYL CITRATE (PF) 100 MCG/2ML IJ SOLN: 25.0000 ug | INTRAMUSCULAR | Status: DC | PRN

## 2020-07-01 MED ORDER — PHENYLEPHRINE HCL 0.25 % NA SOLN
1.0000 | Freq: Four times a day (QID) | NASAL | Status: DC | PRN
  Filled 2020-07-01: qty 15

## 2020-07-01 MED ORDER — SUGAMMADEX SODIUM 500 MG/5ML IV SOLN
INTRAVENOUS | Status: AC
  Filled 2020-07-01: qty 5

## 2020-07-01 MED ORDER — FAMOTIDINE 20 MG PO TABS: 20.0000 mg | ORAL_TABLET | Freq: Once | ORAL | Status: AC

## 2020-07-01 MED ORDER — PROPOFOL 10 MG/ML IV BOLUS
INTRAVENOUS | Status: AC
  Filled 2020-07-01: qty 40

## 2020-07-01 MED ORDER — ONDANSETRON HCL 4 MG/2ML IJ SOLN
INTRAMUSCULAR | Status: AC
  Filled 2020-07-01: qty 2

## 2020-07-01 MED ORDER — OXYCODONE HCL 5 MG/5ML PO SOLN: 5.0000 mg | Freq: Once | ORAL | Status: DC | PRN

## 2020-07-01 MED ORDER — GLYCOPYRROLATE 0.2 MG/ML IJ SOLN
INTRAMUSCULAR | Status: AC
  Filled 2020-07-01: qty 1

## 2020-07-01 MED ORDER — FAMOTIDINE 20 MG PO TABS
ORAL_TABLET | ORAL | Status: AC
  Administered 2020-07-01: 20 mg via ORAL
  Filled 2020-07-01: qty 1

## 2020-07-01 MED ORDER — LIDOCAINE HCL URETHRAL/MUCOSAL 2 % EX GEL
1.0000 "application " | Freq: Once | CUTANEOUS | Status: DC
  Filled 2020-07-01: qty 5

## 2020-07-01 MED ORDER — PHENYLEPHRINE HCL (PRESSORS) 10 MG/ML IV SOLN
INTRAVENOUS | Status: DC | PRN
  Administered 2020-07-01 (×5): 50 ug via INTRAVENOUS

## 2020-07-01 MED ORDER — FENTANYL CITRATE (PF) 100 MCG/2ML IJ SOLN
INTRAMUSCULAR | Status: DC | PRN
  Administered 2020-07-01: 100 ug via INTRAVENOUS
  Administered 2020-07-01: 50 ug via INTRAVENOUS

## 2020-07-01 MED ORDER — ORAL CARE MOUTH RINSE: 15.0000 mL | Freq: Once | OROMUCOSAL | Status: AC

## 2020-07-01 MED ORDER — CHLORHEXIDINE GLUCONATE 0.12 % MT SOLN: 15.0000 mL | Freq: Once | OROMUCOSAL | Status: AC

## 2020-07-01 MED ORDER — ROCURONIUM BROMIDE 10 MG/ML (PF) SYRINGE
PREFILLED_SYRINGE | INTRAVENOUS | Status: AC
  Filled 2020-07-01: qty 10

## 2020-07-01 MED ORDER — ACETAMINOPHEN 10 MG/ML IV SOLN: 1000.0000 mg | Freq: Once | INTRAVENOUS | Status: DC | PRN

## 2020-07-01 MED ORDER — ONDANSETRON HCL 4 MG/2ML IJ SOLN
INTRAMUSCULAR | Status: DC | PRN
  Administered 2020-07-01: 4 mg via INTRAVENOUS

## 2020-07-01 MED ORDER — DEXAMETHASONE SODIUM PHOSPHATE 10 MG/ML IJ SOLN
INTRAMUSCULAR | Status: DC | PRN
  Administered 2020-07-01: 10 mg via INTRAVENOUS

## 2020-07-01 MED ORDER — LIDOCAINE HCL (CARDIAC) PF 100 MG/5ML IV SOSY
PREFILLED_SYRINGE | INTRAVENOUS | Status: DC | PRN
  Administered 2020-07-01: 100 mg via INTRAVENOUS

## 2020-07-01 MED ORDER — MIDAZOLAM HCL 2 MG/2ML IJ SOLN
INTRAMUSCULAR | Status: DC | PRN
  Administered 2020-07-01: 2 mg via INTRAVENOUS

## 2020-07-01 MED ORDER — PROPOFOL 10 MG/ML IV BOLUS
INTRAVENOUS | Status: DC | PRN
  Administered 2020-07-01: 50 mg via INTRAVENOUS
  Administered 2020-07-01: 150 mg via INTRAVENOUS

## 2020-07-01 MED ORDER — SUGAMMADEX SODIUM 200 MG/2ML IV SOLN
INTRAVENOUS | Status: DC | PRN
  Administered 2020-07-01: 240 mg via INTRAVENOUS

## 2020-07-01 MED ORDER — SEVOFLURANE IN SOLN
RESPIRATORY_TRACT | Status: AC
  Filled 2020-07-01: qty 250

## 2020-07-01 MED ORDER — PHENYLEPHRINE HCL (PRESSORS) 10 MG/ML IV SOLN
INTRAVENOUS | Status: AC
  Filled 2020-07-01: qty 1

## 2020-07-01 MED ORDER — LIDOCAINE HCL 4 % EX SOLN
CUTANEOUS | Status: DC | PRN
Start: 2020-07-01 — End: 2020-07-01
  Administered 2020-07-01: 3 mL via TOPICAL

## 2020-07-01 MED ORDER — ROCURONIUM BROMIDE 100 MG/10ML IV SOLN
INTRAVENOUS | Status: DC | PRN
  Administered 2020-07-01: 20 mg via INTRAVENOUS
  Administered 2020-07-01: 30 mg via INTRAVENOUS
  Administered 2020-07-01: 50 mg via INTRAVENOUS

## 2020-07-01 MED ORDER — DEXAMETHASONE SODIUM PHOSPHATE 10 MG/ML IJ SOLN
INTRAMUSCULAR | Status: AC
  Filled 2020-07-01: qty 1

## 2020-07-01 NOTE — Procedures (Signed)
ELECTROMAGNETIC NAVIGATIONAL BRONCHOSCOPY PROCEDURE NOTE  FIBEROPTIC BRONCHOSCOPY WITH BRONCHOALVEOLAR LAVAGE PROCEDURE NOTE  ENDOBRONCHIAL ULTRASOUND PROCEDURE NOTE    Flexible bronchoscopy was performed  by : Lanney Gins MD  assistance by : 1)Repiratory therapist  and 2)[employer] staff and 3) Anesthesia team and 4) Flouroscopy team and 5) [employer] supporting staff   Indication for the procedure was :  Pre-procedural H&P. The following assessment was performed on the day of the procedure prior to initiating sedation History:  Chest pain n Dyspnea y Hemoptysis n Cough y Fever n Other pertinent items n  Examination Vital signs -reviewed as per nursing documentation today Cardiac    Murmurs: n  Rubs : n  Gallop: n Lungs Wheezing: n Rales : n Rhonchi :y  Other pertinent findings: SOB/hypoxemia due to chronic lung disease   Pre-procedural assessment for Procedural Sedation included: Depth of sedation: As per anesthesia team  ASA Classification:  2 Mallampati airway assessment: 3    Medication list reviewed: y  The patient's interval history was taken and revealed: no new complaints The pre- procedure physical examination revealed: No new findings Refer to prior clinic note for details.  Informed Consent: Informed consent was obtained from:  patient after explanation of procedure and risks, benefits, as well as alternative procedures available.  Explanation of level of sedation and possible transfusion was also provided.    Procedural Preparation: Time out was performed and patient was identified by name and birthdate and procedure to be performed and side for sampling, if any, was specified. Pt was intubated by anesthesia.  The patient was appropriately draped.   Fiberoptic bronchoscopy with airway inspection and BAL Procedure findings:  Bronchoscope was inserted via ETT  without difficulty.  Posterior oropharynx, epiglottis, arytenoids, false cords and  vocal cords were not visualized as these were bypassed by endotracheal tube. The distal trachea was normal in circumference and appearance without mucosal, cartilaginous or branching abnormalities.  The main carina was mildly splayed . All right and left lobar airways were visualized to the Subsegmental level.  Sub- sub segmental carinae were identified in all the distal airways.   Secretions were visible in the following airways and appeared to be clear.  The mucosa was : friable at RLL  Airways were notable for:        exophytic lesions :n       extrinsic compression in the following distributions: medial segment of RLL       Friable mucosa: y       Neurosurgeon /pigmentation: n     Post procedure Diagnosis:   Edematous and friable mucosa at RLL     Electromagnetic Navigational Bronchoscopy Procedure Findings:  After appropriate CT-guided planning ENB scope was advanced via endotracheal tube and LG was advanced for registration.  Post appropriate planning and registration peripheral navigation was used to visualize target lesion.    After target alignment was established cytobrush was done X5 - cytotech reports atypical cells consistent with non-small CA.  Next needle biopsy of target lesion at RLL was performed x2 - cytotech report is atypical cells consistent with non-small cell CA.  Next Surgical biopsy was done X4 -[employer] report atypical cells consistent with non -small cell Cancer.  BAL at this subsegment was performed x 3 which was sent in formalin for cytology.   Post procedure diagnosis:  malignancy                     Cytology brushes : RLL X5  Broncho-alveolar  lavage site:RLL X3   sent for cytology                              76m volume infused 40 ml volume returned with serosang and small tissue elements appearance    Fluoroscopy Used: yes ;        Pictorial documentation attached: n    Total dosage of Lidocaine was 065mTotal fluoroscopy time was 2  minutes    Estimated Blood loss: 5cc.  Complications included:  none immediate CXR pending    Disposition: Home with escort (significant other of patient)  Follow up with Dr. AlLanney Ginsn 5 days for result discussion.     FuOttie GlazierD  [facility]

## 2020-07-01 NOTE — Discharge Instructions (Signed)

## 2020-07-01 NOTE — Anesthesia Preprocedure Evaluation (Signed)
Anesthesia Evaluation  Patient identified by MRN, date of birth, ID band Patient awake    Reviewed: Allergy & Precautions, NPO status , Patient's Chart, lab work & pertinent test results  History of Anesthesia Complications Negative for: history of anesthetic complications  Airway Mallampati: II  TM Distance: >3 FB Neck ROM: Full    Dental  (+) Edentulous Upper, Edentulous Lower, Dental Advisory Given   Pulmonary shortness of breath, neg sleep apnea, neg COPD, Current Smoker and Patient abstained from smoking.,  Recent 2-3 PPD smoker. Lung lesions with brain mets   breath sounds clear to auscultation + decreased breath sounds ( )      Cardiovascular Exercise Tolerance: Poor METShypertension, + Peripheral Vascular Disease  (-) CAD and (-) Past MI (-) dysrhythmias  Rhythm:Regular Rate:Normal - Systolic murmurs    Neuro/Psych TIAnegative psych ROS   GI/Hepatic neg GERD  ,(+)     (-) substance abuse  ,   Endo/Other  diabetes  Renal/GU negative Renal ROS     Musculoskeletal   Abdominal   Peds  Hematology   Anesthesia Other Findings Past Medical History: No date: Diabetes mellitus without complication ([facility]) No date: Hyperlipidemia No date: Hypertension No date: Lesion of brain No date: Mass of lung  Reproductive/Obstetrics                             Anesthesia Physical Anesthesia Plan  ASA: III  Anesthesia Plan: General   Post-op Pain Management:    Induction: Intravenous  PONV Risk Score and Plan: 1 and Ondansetron and Dexamethasone  Airway Management Planned: Oral ETT  Additional Equipment: None  Intra-op Plan:   Post-operative Plan: Extubation in OR  Informed Consent: I have reviewed the patients History and Physical, chart, labs and discussed the procedure including the risks, benefits and alternatives for the proposed anesthesia with the patient or authorized  representative who has indicated his/her understanding and acceptance.     Dental advisory given  Plan Discussed with: CRNA and Surgeon  Anesthesia Plan Comments: (Discussed risks of anesthesia with patient, including PONV, sore throat, lip/dental damage. Rare risks discussed as well, such as cardiorespiratory and neurological sequelae. Patient understands. Patient counseled on being higher risk for anesthesia due to comorbidities: heavy smoking history, brain mets with edema. Patient was told about increased risk of cardiac and respiratory events, including death. Patient understands.  Patient has been non compliant with his steroids and metformin )        Anesthesia Quick Evaluation

## 2020-07-01 NOTE — Transfer of Care (Signed)
Immediate Anesthesia Transfer of Care Note  Patient: Tyler Gallagher  Procedure(s) Performed: VIDEO BRONCHOSCOPY WITH ENDOBRONCHIAL ULTRASOUND (N/A ) VIDEO BRONCHOSCOPY WITH ENDOBRONCHIAL NAVIGATION (N/A )  Patient Location: PACU  Anesthesia Type:General  Level of Consciousness: awake and alert   Airway & Oxygen Therapy: Patient Spontanous Breathing and Patient connected to face mask oxygen  Post-op Assessment: Report given to RN and Post -op Vital signs reviewed and stable  Post vital signs: Reviewed and stable  Last Vitals:  Vitals Value Taken Time  BP 126/86 07/01/20 0954  Temp    Pulse 88 07/01/20 0954  Resp 17 07/01/20 0953  SpO2 97 % 07/01/20 0954  Vitals shown include unvalidated device data.  Last Pain:  Vitals:   07/01/20 0624  TempSrc: Tympanic  PainSc: 0-No pain         Complications: No complications documented.

## 2020-07-01 NOTE — Anesthesia Procedure Notes (Signed)
Procedure Name: Intubation Performed by: Demetrius Charity, CRNA Pre-anesthesia Checklist: Patient identified, Patient being monitored, Timeout performed, Emergency Drugs available and Suction available Patient Re-evaluated:Patient Re-evaluated prior to induction Oxygen Delivery Method: Circle system utilized Preoxygenation: Pre-oxygenation with 100% oxygen Induction Type: IV induction Ventilation: Oral airway inserted - appropriate to patient size and Two handed mask ventilation required Laryngoscope Size: McGraph and 4 Grade View: Grade I Tube type: Oral Tube size: 7.0 mm Number of attempts: 1 Airway Equipment and Method: Stylet,  Video-laryngoscopy and LTA kit utilized Placement Confirmation: ETT inserted through vocal cords under direct vision,  positive ETCO2 and breath sounds checked- equal and bilateral Secured at: 23 cm Tube secured with: Tape Dental Injury: Teeth and Oropharynx as per pre-operative assessment

## 2020-07-01 NOTE — Anesthesia Postprocedure Evaluation (Signed)
Anesthesia Post Note  Patient: Tyler Gallagher  Procedure(s) Performed: VIDEO BRONCHOSCOPY WITH ENDOBRONCHIAL ULTRASOUND (N/A ) VIDEO BRONCHOSCOPY WITH ENDOBRONCHIAL NAVIGATION (N/A )  Patient location during evaluation: PACU Anesthesia Type: General Level of consciousness: awake and alert Pain management: pain level controlled Vital Signs Assessment: post-procedure vital signs reviewed and stable Respiratory status: spontaneous breathing, nonlabored ventilation, respiratory function stable and patient connected to nasal cannula oxygen Cardiovascular status: blood pressure returned to baseline and stable Postop Assessment: no apparent nausea or vomiting Anesthetic complications: no   No complications documented.   Last Vitals:  Vitals:   07/01/20 1023 07/01/20 1029  BP: 123/83 120/79  Pulse: 70 79  Resp: 18 18  Temp: (!) 36.1 C (!) 36.1 C  SpO2: 95% 97%    Last Pain:  Vitals:   07/01/20 1029  TempSrc: Temporal  PainSc: 2                  Arita Miss

## 2020-07-01 NOTE — H&P (Signed)
Pulmonary Medicine          Date: 07/01/2020,   MRN# [id number] Tyler Gallagher Apr 15, 1961     Admission                  Current       CHIEF COMPLAINT:   Right Lung mass   HISTORY OF PRESENT ILLNESS   As per admission history Tyler Gallagher is a 59 y.o. male with medical history significant of diabetes, hypertension, left AKA and new diagnosis of lung mass came to ED with complaint of right upper extremity weakness going on for 3 to 4 days.Recent ED visit with complaint of generalized weakness.  And CT chest with a right lung mass suspicious for malignancy.  Patient is a lifetime smoker. He made an outpatient appointment with [name] oncologist and supposed to see her later this week.  Outpatient PET scan was ordered.  For the past 3 to 4 days he was experiencing right upper extremity weakness and pain whenever trying to use his arm.  Denies any headache, nausea or vomiting.  Denies any change in his vision.  No chest pain or shortness of breath.  No other recent illnesses.  Denies any hematuria or urinary symptoms.  Recent change in his appetite or bowel habits.  Patient is a lifelong heavy smoker.  Smokes 2 to 3 packs/day. I discussed wit patient findings of Right lung mass with hilar adenopathy specifically at station 7 and 4R.    PAST MEDICAL HISTORY   Past Medical History:  Diagnosis Date  . Diabetes mellitus without complication ([place])   . Hyperlipidemia   . Hypertension   . Lesion of brain   . Mass of lung      SURGICAL HISTORY   Past Surgical History:  Procedure Laterality Date  . 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 COLONOSCOPY       FAMILY HISTORY   Family History  Problem Relation Age of Onset  . Diabetes Sister   . Diabetes Brother      SOCIAL HISTORY   Social History   Tobacco Use  . Smoking status: Current Every Day Smoker    Packs/day: 2.00    Years: 32.00    Pack years: 64.00    Types: Cigarettes  . Smokeless tobacco: Never Used    . Tobacco comment: trying to stop - not smoked in two weeks  Vaping Use  . Vaping Use: Never used  Substance Use Topics  . Alcohol use: No    Alcohol/week: 0.0 standard drinks  . Drug use: Yes    Types: Marijuana    Comment: two weeks     MEDICATIONS    Home Medication:    Current Medication:  Current Facility-Administered Medications:  .  0.9 %  sodium chloride infusion, , Intravenous, Continuous, Arita Miss, MD, Last Rate: 10 mL/hr at 07/01/20 0639, New Bag at 07/01/20 0639 .  lidocaine (XYLOCAINE) 2 % jelly 1 application, 1 application, Topical, Once, Mahdiya Mossberg, MD .  phenylephrine (NEO-SYNEPHRINE) 0.25 % nasal spray 1 spray, 1 spray, Each Nare, Q6H PRN, [name], MD    ALLERGIES   Penicillins     REVIEW OF SYSTEMS    Review of Systems:  Gen:  Denies  fever, sweats, chills weigh loss  HEENT: Denies blurred vision, double vision, ear pain, eye pain, hearing loss, nose bleeds, sore throat Cardiac:  No dizziness, chest pain or heaviness, chest tightness,edema Resp:   Denies  cough or sputum porduction, shortness of breath,wheezing, hemoptysis,  Gi: Denies swallowing difficulty, stomach pain, nausea or vomiting, diarrhea, constipation, bowel incontinence Gu:  Denies bladder incontinence, burning urine Ext:   Denies Joint pain, stiffness or swelling Skin: Denies  skin rash, easy bruising or bleeding or hives Endoc:  Denies polyuria, polydipsia , polyphagia or weight change Psych:   Denies depression, insomnia or hallucinations   Other:  All other systems negative   VS: BP 126/85   Pulse 76   Temp 98.2 F (36.8 C) (Tympanic)   Resp 18   SpO2 98%      PHYSICAL EXAM    GENERAL:NAD, no fevers, chills, no weakness no fatigue HEAD: Normocephalic, atraumatic.  EYES: Pupils equal, round, reactive to light. Extraocular muscles intact. No scleral icterus.  MOUTH: Moist mucosal membrane. Dentition intact. No abscess noted.  EAR, NOSE, THROAT:  Clear without exudates. No external lesions.  NECK: Supple. No thyromegaly. No nodules. No JVD.  PULMONARY: CTAB CARDIOVASCULAR: S1 and S2. Regular rate and rhythm. No murmurs, rubs, or gallops. No edema. Pedal pulses 2+ bilaterally.  GASTROINTESTINAL: Soft, nontender, nondistended. No masses. Positive bowel sounds. No hepatosplenomegaly.  MUSCULOSKELETAL: No swelling, clubbing, or edema. Range of motion full in all extremities.  NEUROLOGIC: Cranial nerves II through XII are intact. No gross focal neurological deficits. Sensation intact. Reflexes intact.  SKIN: No ulceration, lesions, rashes, or cyanosis. Skin warm and dry. Turgor intact.  PSYCHIATRIC: Mood, affect within normal limits. The patient is awake, alert and oriented x 3. Insight, judgment intact.       IMAGING    CT HEAD WO CONTRAST  Result Date: 06/21/2020 CLINICAL DATA:  Right-sided weakness EXAM: CT HEAD WITHOUT CONTRAST TECHNIQUE: Contiguous axial images were obtained from the base of the skull through the vertex without intravenous contrast. COMPARISON:  2018 FINDINGS: Brain: There is no acute intracranial hemorrhage. Hypoattenuation is present in the high left frontal white matter reflecting vasogenic edema. There is mild regional mass effect. Probable underlying 1.3 x 0.6 x 1.6 cm lesion of the left superior frontal gyrus. Chronic right frontal infarct. Gray-white differentiation is otherwise preserved. There is no extra-axial fluid collection. Ventricles and sulci are within normal limits in size and configuration. Vascular: No hyperdense vessel or unexpected calcification. Skull: Calvarium is unremarkable. Sinuses/Orbits: No acute finding. Other: None. IMPRESSION: Vasogenic edema in the left frontal lobe associated with a 1.6 cm lesion. This likely reflects a metastasis given lung mass. Contrast enhanced MRI is recommended to exclude additional lesions. No acute intracranial hemorrhage or significant mass effect. Electronically  Signed   By: Macy Mis M.D.   On: 06/21/2020 13:09   MR Brain W and Wo Contrast  Result Date: 06/21/2020 CLINICAL DATA:  Brain mass.  Lung mass. EXAM: MRI HEAD WITHOUT AND WITH CONTRAST TECHNIQUE: Multiplanar, multiecho pulse sequences of the brain and surrounding structures were obtained without and with intravenous contrast. CONTRAST:  63m GADAVIST GADOBUTROL 1 MMOL/ML IV SOLN COMPARISON:  CT head 06/21/2020 FINDINGS: Brain: Enhancing mass lesion left medial frontal cortex. The enhancing mass measures 19 x 16 mm and has central necrosis and moderate surrounding vasogenic edema in the white matter. No second mass lesion identified. Chronic infarct right frontal lobe.  No acute infarct or hemorrhage. Vascular: Normal arterial flow voids. Skull and upper cervical spine: No focal skeletal lesion. Sinuses/Orbits: Mild mucosal edema right maxillary sinus. 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 Chronic infarct right frontal  lobe. Electronically Signed   By: Franchot Gallo M.D.   On: 06/21/2020 15:15   CT ABDOMEN PELVIS W CONTRAST  Result Date: 06/21/2020 CLINICAL DATA:  Right-sided weakness for 3-4 days. EXAM: CT ABDOMEN AND PELVIS WITH CONTRAST TECHNIQUE: Multidetector CT imaging of the abdomen and pelvis was performed using the standard protocol following bolus administration of intravenous contrast. CONTRAST:  185m OMNIPAQUE IOHEXOL 300 MG/ML  SOLN COMPARISON:  None. FINDINGS: Lower chest: A 3.1 cm diameter round noncalcified soft tissue mass is seen within the posteromedial aspect of the right lower lobe. Hepatobiliary: There is diffuse fatty infiltration of the liver parenchyma. No focal liver abnormality is seen. A cluster of subcentimeter gallstones are seen within the gallbladder lumen. There is no evidence of gallbladder wall thickening or biliary dilatation. Pancreas: Unremarkable. No pancreatic ductal  dilatation or surrounding inflammatory changes. Spleen: Normal in size without focal abnormality. Adrenals/Urinary Tract: Adrenal glands are unremarkable. Kidneys are normal, without renal calculi or hydronephrosis. A 1.9 cm diameter cyst is seen adjacent to the lateral aspect of the mid left kidney. An 8.0 cm x 4.8 cm area of heterogeneous mildly increased attenuation is seen within the dependent portion of the urinary bladder. Stomach/Bowel: Stomach is within normal limits. Appendix appears normal. No evidence of bowel wall thickening, distention, or inflammatory changes. Noninflamed diverticula are seen within the proximal sigmoid colon. Vascular/Lymphatic: There is marked severity calcification of the abdominal aorta. No enlarged abdominal or pelvic lymph nodes. Reproductive: The prostate gland is mildly enlarged. Other: No abdominal wall hernia or abnormality. No abdominopelvic ascites. Musculoskeletal: Degenerative changes seen within the lower lumbar spine at the level of L5-S1. IMPRESSION: 1. 3.1 cm diameter round noncalcified soft tissue mass within the posteromedial aspect of the right lower lobe. Further evaluation with chest CT is recommended, as an underlying neoplasm cannot be excluded. 2. Cholelithiasis. 3. Sigmoid diverticulosis. 4. Large area of heterogeneous increased attenuation within the lumen of the urinary bladder. While this may represent a large amount of blood products, the presence of a urinary bladder mass cannot be excluded. Aortic Atherosclerosis (ICD10-I70.0). Electronically Signed   By: TVirgina NorfolkM.D.   On: 06/21/2020 16:42   NM PET Image Initial (PI) Skull Base To Thigh  Result Date: 06/29/2020 CLINICAL DATA:  Initial treatment strategy for lung mass with intracranial metastatic disease. EXAM: NUCLEAR MEDICINE PET SKULL BASE TO THIGH TECHNIQUE: 14.1 mCi F-18 FDG was injected intravenously. Full-ring PET imaging was performed from the skull base to thigh after the  radiotracer. CT data was obtained and used for attenuation correction and anatomic localization. Fasting blood glucose: 164 mg/dl COMPARISON:  CT abdomen 06/21/2020 and CT chest 06/17/2020 FINDINGS: Mediastinal blood pool activity: SUV max 2.9 Liver activity: SUV max NA NECK: No significant abnormal hypermetabolic activity in this region. The level of the known left frontal lobe vertex metastatic lesion is not included on today's PET-CT. Incidental CT findings: Chronic right maxillary sinusitis. Bilateral common carotid atherosclerotic calcification. CHEST: The medial right lower lobe mass measures 3.7 by 3.4 cm on image 138/3, has maximum SUV of 13.7, compatible with malignancy. This lesion abuts the visceral pleura without definite pleural invasion. No hypermetabolic or pathologically enlarged adenopathy in the chest. Incidental CT findings: Coronary, aortic arch, and branch vessel atherosclerotic vascular disease. Airway thickening is present, suggesting bronchitis or reactive airways disease. Centrilobular emphysema. ABDOMEN/PELVIS: Focal anal activity is most likely physiologic, maximum SUV 9.9. Scattered likely physiologic bowel activity. No adrenal mass. No compelling findings of metastatic disease to the  abdomen/pelvis. Incidental CT findings: Dependent density in the gallbladder favoring gallstones. Aortoiliac atherosclerotic vascular disease. Subcutaneous calcifications along the right groin region. SKELETON: No significant abnormal hypermetabolic activity in this region. Incidental CT findings: Bony demineralization in the left proximal femur extending down towards the above the knee amputation, likely from disuse and reduced weight-bearing. 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 There is degenerative disc disease at L5-S1. IMPRESSION: 1. 3.7 cm right lower lobe mass is highly hypermetabolic with maximum SUV 30.7, compatible with malignancy. 2. No  hypermetabolic adenopathy or evidence of metastatic disease to the abdomen/pelvis or skeleton. The patient has a known likely metastatic lesion to the left frontal vertex of the brain, a level not included on today's PET-CT. 3. Other imaging findings of potential clinical significance: Chronic right maxillary sinusitis. Aortic Atherosclerosis (ICD10-I70.0) and Emphysema (ICD10-J43.9). Coronary atherosclerosis. Airway thickening is present, suggesting bronchitis or reactive airways disease. Cholelithiasis. 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 Electronically Signed   By: Van Clines M.D.   On: 06/29/2020 15:32   CT OUTSIDE FILMS CHEST  Result Date: 06/22/2020 This examination belongs to an outside facility and is stored here for comparison purposes only.  Contact the originating outside institution for any associated report or interpretation.  DG Outside Films Chest  Result Date: 06/22/2020 This examination belongs to an outside facility and is stored here for comparison purposes only.  Contact the originating outside institution for any associated report or interpretation.     ASSESSMENT/PLAN   3cm RLL lung mass -discussed with patient need for tissue diagnosis via bronchoscopy -patient denies taking aspirin but states he may have taken some BC powder -he is agreeable to ENB and we discussed details of procedure, post procedure expected expectoration of possible clots/congestion due to FNA bx and bronchial washings/BAL -questions answered and patient is agreeable. -Reviewed possible Complications include: bleeding, cough, infection, pneumothorax, pneumomediastinum, possible need for chest tube and intubation with mechanical ventilation and rarely death.  Patient relates understanding above and all additional questions have been answered again.           Thank you for allowing me to participate in the care  of this patient.   Patient/Family are satisfied with care plan and all questions have been answered.   This document was prepared using Dragon voice recognition software and may include unintentional dictation errors.     [name], M.D.  Division of [facility]

## 2020-07-02 ENCOUNTER — Encounter: Payer: Self-pay | Admitting: Pulmonary Disease

## 2020-07-05 ENCOUNTER — Inpatient Hospital Stay: Payer: Medicare HMO | Admitting: Oncology

## 2020-07-06 ENCOUNTER — Other Ambulatory Visit: Payer: Self-pay | Admitting: Oncology

## 2020-07-06 LAB — SURGICAL PATHOLOGY

## 2020-07-06 LAB — CYTOLOGY - NON PAP

## 2020-07-07 ENCOUNTER — Ambulatory Visit: Admission: RE | Admit: 2020-07-07 | Payer: Medicare HMO | Source: Ambulatory Visit | Admitting: Radiation Oncology

## 2020-07-07 ENCOUNTER — Other Ambulatory Visit: Payer: Self-pay

## 2020-07-07 ENCOUNTER — Encounter: Payer: Self-pay | Admitting: Oncology

## 2020-07-07 ENCOUNTER — Inpatient Hospital Stay (HOSPITAL_BASED_OUTPATIENT_CLINIC_OR_DEPARTMENT_OTHER): Payer: Medicare HMO | Admitting: Oncology

## 2020-07-07 ENCOUNTER — Other Ambulatory Visit: Payer: Self-pay | Admitting: *Deleted

## 2020-07-07 ENCOUNTER — Ambulatory Visit
Admission: RE | Admit: 2020-07-07 | Discharge: 2020-07-07 | Disposition: A | Discharge status: Home / Self Care | Payer: Medicare HMO | Source: Ambulatory Visit | Attending: Radiation Oncology | Admitting: Radiation Oncology

## 2020-07-07 ENCOUNTER — Encounter: Payer: Self-pay | Admitting: *Deleted

## 2020-07-07 ENCOUNTER — Inpatient Hospital Stay: Payer: Medicare HMO | Attending: Oncology | Admitting: Oncology

## 2020-07-07 VITALS — BP 129/79 | HR 73 | Temp 98.1°F | Resp 18 | Wt 260.0 lb

## 2020-07-07 DIAGNOSIS — C7931 Secondary malignant neoplasm of brain: Secondary | ICD-10-CM | POA: Insufficient documentation

## 2020-07-07 DIAGNOSIS — N329 Bladder disorder, unspecified: Secondary | ICD-10-CM | POA: Insufficient documentation

## 2020-07-07 DIAGNOSIS — J439 Emphysema, unspecified: Secondary | ICD-10-CM | POA: Diagnosis not present

## 2020-07-07 DIAGNOSIS — Z7952 Long term (current) use of systemic steroids: Secondary | ICD-10-CM | POA: Diagnosis not present

## 2020-07-07 DIAGNOSIS — Z7189 Other specified counseling: Secondary | ICD-10-CM | POA: Diagnosis not present

## 2020-07-07 DIAGNOSIS — Z79899 Other long term (current) drug therapy: Secondary | ICD-10-CM | POA: Diagnosis not present

## 2020-07-07 DIAGNOSIS — F17210 Nicotine dependence, cigarettes, uncomplicated: Secondary | ICD-10-CM

## 2020-07-07 DIAGNOSIS — Z833 Family history of diabetes mellitus: Secondary | ICD-10-CM | POA: Insufficient documentation

## 2020-07-07 DIAGNOSIS — E119 Type 2 diabetes mellitus without complications: Secondary | ICD-10-CM | POA: Insufficient documentation

## 2020-07-07 DIAGNOSIS — I10 Essential (primary) hypertension: Secondary | ICD-10-CM | POA: Insufficient documentation

## 2020-07-07 DIAGNOSIS — Z89612 Acquired absence of left leg above knee: Secondary | ICD-10-CM | POA: Diagnosis not present

## 2020-07-07 DIAGNOSIS — E785 Hyperlipidemia, unspecified: Secondary | ICD-10-CM | POA: Insufficient documentation

## 2020-07-07 DIAGNOSIS — N400 Enlarged prostate without lower urinary tract symptoms: Secondary | ICD-10-CM | POA: Diagnosis not present

## 2020-07-07 DIAGNOSIS — C3431 Malignant neoplasm of lower lobe, right bronchus or lung: Secondary | ICD-10-CM | POA: Insufficient documentation

## 2020-07-07 DIAGNOSIS — N3289 Other specified disorders of bladder: Secondary | ICD-10-CM | POA: Diagnosis not present

## 2020-07-07 DIAGNOSIS — Z7984 Long term (current) use of oral hypoglycemic drugs: Secondary | ICD-10-CM | POA: Insufficient documentation

## 2020-07-07 MED ORDER — PANTOPRAZOLE SODIUM 20 MG PO TBEC
20.0000 mg | DELAYED_RELEASE_TABLET | Freq: Every day | ORAL | 1 refills | Status: DC
Start: 2020-07-07 — End: 2020-10-31

## 2020-07-07 NOTE — Consult Note (Signed)
NEW PATIENT EVALUATION  Name: Tyler Gallagher  MRN: [id number]  Date:   07/07/2020     DOB: Oct 05, 1961   This 59 y.o. 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 Cancer    Initial consultation    DIAGNOSIS: The encounter diagnosis was Brain metastasis ([place]).   PREVIOUS INVESTIGATIONS:  Brain MRI and CT scans of the chest including PET CT scan reviewed Clinical notes reviewed Pathology report reviewed  HPI: Patient is a 59 year old male who was seen in June at [facility] when he presented with symptoms of shortness of breath.  He had a 3.8 x 3.8 cm medial right lower lobe mass abutting the right paravertebral pleural margin.  He then developed increasing weakness right sided MRI of the brain showed solitary metastatic deposit left medial frontal lobe with surrounding edema.  He also has an area on his abdominal CT scan with some heterogeneous attenuation in the urinary bladder which is to be worked up by Dr. Erlene Quan next week.  He has been started on steroids which has not improved his right-sided weakness and headaches.  He had a PET CT scan again showing hypermetabolic activity in the 3.7 cm right lower lobe mass.  No hypermetabolic adenopathy or metastatic disease in the abdomen pelvis or skeleton was noted.  He specifically denies any right-sided weakness at this time and has regained control of his urinary functions.  He underwent a right lower lobe lung biopsy which was diagnostic of non-small cell lung cancer favoring adenocarcinoma.  He has been seen by medical oncology is now referred to radiation oncology for consideration of treatment.  PLANNED TREATMENT REGIMEN: SRS of the brain and SBRT of the lung  PAST MEDICAL HISTORY:  has a past medical history of Diabetes mellitus without complication  ([place]), Hyperlipidemia, Hypertension, Lesion of brain, and Mass of lung.    PAST SURGICAL HISTORY:  Past Surgical History:  Procedure Laterality Date  . 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 COLONOSCOPY    . VIDEO BRONCHOSCOPY WITH ENDOBRONCHIAL NAVIGATION N/A 07/01/2020   Procedure: VIDEO BRONCHOSCOPY WITH ENDOBRONCHIAL NAVIGATION;  Surgeon: [name], MD;  Location: [facility] ORS;  Service: Thoracic;  Laterality: N/A;  . VIDEO BRONCHOSCOPY WITH ENDOBRONCHIAL ULTRASOUND N/A 07/01/2020   Procedure: VIDEO BRONCHOSCOPY WITH ENDOBRONCHIAL ULTRASOUND;  Surgeon: [name], MD;  Location: [facility] ORS;  Service: Thoracic;  Laterality: N/A;    FAMILY HISTORY: family history includes Diabetes in his brother and sister.  SOCIAL HISTORY:  reports that he has been smoking cigarettes. He has a 64.00 pack-year smoking history. He has never used smokeless tobacco. He reports current drug use. Drug: Marijuana. He reports that he does not drink alcohol.  ALLERGIES: Penicillins  MEDICATIONS:  Current Outpatient Medications  Medication Sig Dispense Refill  . atorvastatin (LIPITOR) 10 MG tablet Take 10 mg by mouth daily.    . Cyanocobalamin (RA VITAMIN B-12 TR) 1000 MCG TBCR Take 1 tablet by mouth daily.     Marland Kitchen dexamethasone (DECADRON) 4 MG tablet Take 2 tablets (8 mg total) by mouth 2 (two) times daily. 60 tablet 1  . gabapentin (NEURONTIN) 300 MG capsule Take 900 mg by mouth 3 (three) times daily.    Marland Kitchen gemfibrozil (LOPID) 600 MG tablet Take 0.5 tablets by mouth 2 (two) times daily.    Marland Kitchen lisinopril-hydrochlorothiazide (ZESTORETIC)  10-12.5 MG tablet Take 1 tablet by mouth daily.    Marland Kitchen LORazepam (ATIVAN) 0.5 MG tablet Take 1 tablet (0.5 mg total) by mouth every 8 (eight) hours as needed for anxiety. 30 tablet 0  . metFORMIN (GLUCOPHAGE-XR) 500 MG 24 hr tablet Take 500 mg by mouth 2 (two) times daily.    . pantoprazole (PROTONIX) 20 MG tablet Take 1 tablet (20 mg total) by mouth daily. 30 tablet 1  . pioglitazone  (ACTOS) 30 MG tablet Take 30 mg by mouth daily.     No current facility-administered medications for this encounter.    ECOG PERFORMANCE STATUS:  1 - Symptomatic but completely ambulatory  REVIEW OF SYSTEMS: Patient denies any weight loss, fatigue, weakness, fever, chills or night sweats. Patient denies any loss of vision, blurred vision. Patient denies any ringing  of the ears or hearing loss. No irregular heartbeat. Patient denies heart murmur or history of fainting. Patient denies any chest pain or pain radiating to her upper extremities. Patient denies any shortness of breath, difficulty breathing at night, cough or hemoptysis. Patient denies any swelling in the lower legs. Patient denies any nausea vomiting, vomiting of blood, or coffee ground material in the vomitus. Patient denies any stomach pain. Patient states has had normal bowel movements no significant constipation or diarrhea. Patient denies any dysuria, hematuria or significant nocturia. Patient denies any problems walking, swelling in the joints or loss of balance. Patient denies any skin changes, loss of hair or loss of weight. Patient denies any excessive worrying or anxiety or significant depression. Patient denies any problems with insomnia. Patient denies excessive thirst, polyuria, polydipsia. Patient denies any swollen glands, patient denies easy bruising or easy bleeding. Patient denies any recent infections, allergies or URI. Patient "s visual fields have not changed significantly in recent time.   PHYSICAL EXAM: There were no vitals taken for this visit. Patient has a left AKA.  No discrete focal neurologic deficit is noted.  Crude visual fields within normal range.  Well-developed well-nourished patient in NAD. HEENT reveals PERLA, EOMI, discs not visualized.  Oral cavity is clear. No oral mucosal lesions are identified. Neck is clear without evidence of cervical or supraclavicular adenopathy. Lungs are clear to A&P. Cardiac  examination is essentially unremarkable with regular rate and rhythm without murmur rub or thrill. Abdomen is benign with no organomegaly or masses noted. Motor sensory and DTR levels are equal and symmetric in the upper and lower extremities. Cranial nerves II through XII are grossly intact. Proprioception is intact. No peripheral adenopathy or edema is identified. No motor or sensory levels are noted. Crude visual fields are within normal range. LABORATORY DATA: Pathology report reviewed    RADIOLOGY RESULTS: Brain MRI as well as CT scan and PET CT scan reviewed.   IMPRESSION: Stage IV lung cancer adenocarcinoma with solitary brain metastasis and solitary lung nodule in 59 year old male  PLAN: At this time I will plan on SRS to his brain lesion.  We have ordered a thin slice MRI scan and then simulation for treatment planning.  We will also offer SBRT to his right lower lobe mass 6000 cGy in 5 fractions.  We will see consent that in after his SRS.  Risks and benefits of treatment including headache fatigue possible hair loss for the brain minimal side effects for the lung including possible fatigue development of a cough about a month out all were discussed in detail with the patient and his son.  They both seem to comprehend  my treatment plan well.  We will arrange his MRI scan of the brain and simulation.  Patient knows to call with any concerns at any time he will remain on steroids at this time.  I would like to take this opportunity to thank you for allowing me to participate in the care of your patient.Noreene Filbert, MD

## 2020-07-07 NOTE — Progress Notes (Signed)
Pt in for follow up, states quit smoking 2 weeks ago. Not using smoking cessation products anymore.

## 2020-07-07 NOTE — Progress Notes (Signed)
  Oncology Nurse Navigator Documentation  Navigator Location: [facility] (07/07/20 1600)   )Navigator Encounter Type: Follow-up Appt;Initial RadOnc (07/07/20 1600)     Confirmed Diagnosis Date: 07/06/20 (07/07/20 1600)               Patient Visit Type: MedOnc;RadOnc (07/07/20 1600) Treatment Phase: Pre-Tx/Tx Discussion (07/07/20 1600) Barriers/Navigation Needs: Coordination of Care;Education (07/07/20 1600) Education: Newly Diagnosed Cancer Education;Understanding Cancer/ Treatment Options (07/07/20 1600) Interventions: Coordination of Care;Education;Referrals (07/07/20 1600) Referrals:  (neurosurgery) (07/07/20 1600) Coordination of Care: Appts (07/07/20 1600) Education Method: Written (07/07/20 1600)       met with patient and his son during follow up visit with [name] to discuss pathology results and treatment options. Pt given resources regarding diagnosis and supportive services available. All questions answered during visit. Reviewed upcoming appts and informed that will be called with appt for CT simulation with [name] and his consult with Dr. [name] to discuss neurosurgery. Instructed pt to call with any further questions or needs. Pt verbalized understanding. Nothing further needed at this time.         Time Spent with Patient: 60 (07/07/20 1600)

## 2020-07-07 NOTE — Progress Notes (Signed)
Smoking Cessation [place]  [phone] [phone]  Patient Care Team: Juluis Pitch, MD as PCP - General (Family Medicine) Telford Nab, RN as Oncology Nurse Navigator   Name of the patient: Tyler Gallagher  [id number]  [birth year]   Date of visit: 07/07/2020   Diagnosis- 1. Cigarette nicotine dependence, uncomplicated  2. Personal history of tobacco use, presenting hazards to health  Other orders - nicotine (NICODERM CQ - DOSED IN MG/24 HOURS) 21 mg/24hr patch; Place 1 patch (21 mg total) onto the skin daily. - nicotine (NICOTROL) 10 MG inhaler; Inhale 1 Cartridge (1 continuous puffing total) into the lungs as needed for smoking cessation.   Chief complaint/ Reason for visit- Smoking Cessation counseling  PMH-Tyler Gallagher is a 59 year old male with past medical history significant for TIA, type 2 diabetes, hyperlipidemia, tobacco abuse, obesity, above-knee amputation and recent diagnosis of right lower lung mass.  He initially presented to [facility] ED for right upper extremity weakness x3 to 4 days.  Work-up included CT abdomen pelvis, brain and MRI of the brain which unfortunately revealed solitary brain mass.  He was discharged home on oral steroids with short-term follow-up with [name].  PET scan showed hypermetabolic right lower lobe mass compatible with malignancy.  No hypermetabolic adenopathy or evidence of metastatic disease.   Interval history- Tyler Gallagher is a 59 yo patient who presents to smoking cessation clinic for follow-up to discuss how he has tolerated his nicotine patches and nicotine inhaler.  During our visit today, we discussed techniques that were helpful for him to continue to remain tobacco free.  Smoking history:   Tobacco Use: High Risk  . Smoking Tobacco Use: Current Every Day Smoker  . Smokeless Tobacco Use: Never Used     Type of tobacco:   Cigarettes- (2-3 ppd X 40 years)   Approximate  date of last quit attempt: Last cigarette was on Wednesday, 06/22/2020.   Longest period of time quit in the past: approximately 30 days   What caused the relapse: Being exposed to other smokers  Medication used in the past:  Nicotine patch-patient has been hospitalized on several different occasions where he was unable to smoke and he used a nicotine patch which he reports as useful.  He has never tried bupropion or Chantix.   Readiness to quit: He is ready to quit.  His last cigarette was on 06/22/2020-he remains tobacco free.  Other smokers in household: Tyler Gallagher wife continues to smoke.  Plan: Quit date: 06/22/2020  Today, Tyler Gallagher is doing well.  He continues to be anxious about his new diagnosis of lung cancer.  He tells me he has Gallagher well with the nicotine patches.  He did not fill the nicotine inhaler due to cost.  He used the nicotine patches for about 10 days and is no longer using them.  Patient states he has no desire to smoke again.  He wants to remain tobacco free.  He does not need a refill of any medications.   ECOG FS:2 - Symptomatic, <50% confined to bed  Review of systems- Review of Systems  Constitutional: Negative.  Negative for chills, fever, malaise/fatigue and weight loss.       Wheelchair  HENT: Negative for congestion, ear pain and tinnitus.   Eyes: Negative.  Negative for blurred vision and double vision.  Respiratory: Negative.  Negative for cough, sputum production and shortness of breath.   Cardiovascular: Negative.  Negative for chest pain,  palpitations and leg swelling.  Gastrointestinal: Negative.  Negative for abdominal pain, constipation, diarrhea, nausea and vomiting.       AKA-left   Genitourinary: Negative for dysuria, frequency and urgency.  Musculoskeletal: Negative for back pain and falls.  Skin: Negative.  Negative for rash.  Neurological: Negative.  Negative for weakness and headaches.  Endo/Heme/Allergies: Negative.  Does not  bruise/bleed easily.  Psychiatric/Behavioral: Negative.  Negative for depression. The patient is not nervous/anxious and does not have insomnia.       Allergies  Allergen Reactions  . Penicillins      Past Medical History:  Diagnosis Date  . Diabetes mellitus without complication ([place])   . Hyperlipidemia   . Hypertension   . Lesion of brain   . Mass of lung      Past Surgical History:  Procedure Laterality Date  . 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 COLONOSCOPY    . VIDEO BRONCHOSCOPY WITH ENDOBRONCHIAL NAVIGATION N/A 07/01/2020   Procedure: VIDEO BRONCHOSCOPY WITH ENDOBRONCHIAL NAVIGATION;  Surgeon: [name], MD;  Location: [facility] ORS;  Service: Thoracic;  Laterality: N/A;  . VIDEO BRONCHOSCOPY WITH ENDOBRONCHIAL ULTRASOUND N/A 07/01/2020   Procedure: VIDEO BRONCHOSCOPY WITH ENDOBRONCHIAL ULTRASOUND;  Surgeon: [name], MD;  Location: [facility] ORS;  Service: Thoracic;  Laterality: N/A;    Social History   Socioeconomic History  . Marital status: Divorced    Spouse name: Not on file  . Number of children: Not on file  . Years of education: Not on file  . Highest education level: Not on file  Occupational History  . Not on file  Tobacco Use  . Smoking status: Current Every Day Smoker    Packs/day: 2.00    Years: 32.00    Pack years: 64.00    Types: Cigarettes  . Smokeless tobacco: Never Used  . Tobacco comment: trying to stop - not smoked in two weeks  Vaping Use  . Vaping Use: Never used  Substance and Sexual Activity  . Alcohol use: No    Alcohol/week: 0.0 standard drinks  . Drug use: Yes    Types: Marijuana    Comment: two weeks  . Sexual activity: Not on file  Other Topics Concern  . Not on file  Social History Narrative  . Not on file   Social Determinants of Health   Financial Resource Strain:   . Difficulty of Paying Living Expenses:   Food Insecurity:   . Worried About Charity fundraiser in the Last Year:   . Arboriculturist in the Last  Year:   Transportation Needs:   . Film/video editor (Medical):   Marland Kitchen Lack of Transportation (Non-Medical):   Physical Activity:   . Days of Exercise per Week:   . Minutes of Exercise per Session:   Stress:   . Feeling of Stress :   Social Connections:   . Frequency of Communication with Friends and Family:   . Frequency of Social Gatherings with Friends and Family:   . Attends Religious Services:   . Active Member of Clubs or Organizations:   . Attends Archivist Meetings:   Marland Kitchen Marital Status:   Intimate Partner Violence:   . Fear of Current or Ex-Partner:   . Emotionally Abused:   Marland Kitchen Physically Abused:   . Sexually Abused:     Family History  Problem Relation Age of Onset  . Diabetes Sister   . Diabetes Brother      Current Outpatient  Medications:  .  atorvastatin (LIPITOR) 10 MG tablet, Take 10 mg by mouth daily., Disp: , Rfl:  .  Cyanocobalamin (RA VITAMIN B-12 TR) 1000 MCG TBCR, Take 1 tablet by mouth daily. , Disp: , Rfl:  .  dexamethasone (DECADRON) 4 MG tablet, Take 2 tablets (8 mg total) by mouth 2 (two) times daily., Disp: 60 tablet, Rfl: 1 .  gabapentin (NEURONTIN) 300 MG capsule, Take 900 mg by mouth 3 (three) times daily., Disp: , Rfl:  .  gemfibrozil (LOPID) 600 MG tablet, Take 0.5 tablets by mouth 2 (two) times daily., Disp: , Rfl:  .  lisinopril-hydrochlorothiazide (ZESTORETIC) 10-12.5 MG tablet, Take 1 tablet by mouth daily., Disp: , Rfl:  .  LORazepam (ATIVAN) 0.5 MG tablet, Take 1 tablet (0.5 mg total) by mouth every 8 (eight) hours as needed for anxiety., Disp: 30 tablet, Rfl: 0 .  metFORMIN (GLUCOPHAGE-XR) 500 MG 24 hr tablet, Take 500 mg by mouth 2 (two) times daily., Disp: , Rfl:  .  pantoprazole (PROTONIX) 20 MG tablet, Take 1 tablet (20 mg total) by mouth daily., Disp: 30 tablet, Rfl: 1 .  pioglitazone (ACTOS) 30 MG tablet, Take 30 mg by mouth daily., Disp: , Rfl:   Physical exam: There were no vitals filed for this visit. Physical  Exam Constitutional:      Appearance: Normal appearance.     Comments: Wheelchair  HENT:     Head: Normocephalic and atraumatic.  Eyes:     Pupils: Pupils are equal, round, and reactive to light.  Cardiovascular:     Rate and Rhythm: Normal rate and regular rhythm.     Heart sounds: Normal heart sounds. No murmur heard.   Pulmonary:     Effort: Pulmonary effort is normal.     Breath sounds: Normal breath sounds. No wheezing.  Abdominal:     General: Bowel sounds are normal. There is no distension.     Palpations: Abdomen is soft.     Tenderness: There is no abdominal tenderness.  Musculoskeletal:        General: Normal range of motion.     Cervical back: Normal range of motion.     Comments: AKA-Left  Skin:    General: Skin is warm and dry.     Findings: No rash.  Neurological:     Mental Status: He is alert and oriented to person, place, and time.  Psychiatric:        Judgment: Judgment normal.      CMP Latest Ref Rng & Units 06/21/2020  Glucose 70 - 99 mg/dL 207(H)  BUN 6 - 20 mg/dL 9  Creatinine 0.61 - 1.24 mg/dL 0.71  Sodium 135 - 145 mmol/L 136  Potassium 3.5 - 5.1 mmol/L 3.5  Chloride 98 - 111 mmol/L 97(L)  CO2 22 - 32 mmol/L 27  Calcium 8.9 - 10.3 mg/dL 9.7  Total Protein 6.5 - 8.1 g/dL 7.7  Total Bilirubin 0.3 - 1.2 mg/dL 0.7  Alkaline Phos 38 - 126 U/L 90  AST 15 - 41 U/L 30  ALT 0 - 44 U/L 42   CBC Latest Ref Rng & Units 06/21/2020  WBC 4.0 - 10.5 K/uL 7.6  Hemoglobin 13.0 - 17.0 g/dL 15.9  Hematocrit 39 - 52 % 45.4  Platelets 150 - 400 K/uL 229    No images are attached to the encounter.  CT HEAD WO CONTRAST  Result Date: 06/21/2020 CLINICAL DATA:  Right-sided weakness EXAM: CT HEAD WITHOUT CONTRAST TECHNIQUE: Contiguous axial images were  obtained from the base of the skull through the vertex without intravenous contrast. COMPARISON:  2018 FINDINGS: Brain: There is no acute intracranial hemorrhage. Hypoattenuation is present in the high left frontal  white matter reflecting vasogenic edema. There is mild regional mass effect. Probable underlying 1.3 x 0.6 x 1.6 cm lesion of the left superior frontal gyrus. Chronic right frontal infarct. Gray-white differentiation is otherwise preserved. There is no extra-axial fluid collection. Ventricles and sulci are within normal limits in size and configuration. Vascular: No hyperdense vessel or unexpected calcification. Skull: Calvarium is unremarkable. Sinuses/Orbits: No acute finding. Other: None. IMPRESSION: Vasogenic edema in the left frontal lobe associated with a 1.6 cm lesion. This likely reflects a metastasis given lung mass. Contrast enhanced MRI is recommended to exclude additional lesions. No acute intracranial hemorrhage or significant mass effect. Electronically Signed   By: Macy Mis M.D.   On: 06/21/2020 13:09   MR Brain W and Wo Contrast  Result Date: 06/21/2020 CLINICAL DATA:  Brain mass.  Lung mass. EXAM: MRI HEAD WITHOUT AND WITH CONTRAST TECHNIQUE: Multiplanar, multiecho pulse sequences of the brain and surrounding structures were obtained without and with intravenous contrast. CONTRAST:  42mL GADAVIST GADOBUTROL 1 MMOL/ML IV SOLN COMPARISON:  CT head 06/21/2020 FINDINGS: Brain: Enhancing mass lesion left medial frontal cortex. The enhancing mass measures 19 x 16 mm and has central necrosis and moderate surrounding vasogenic edema in the white matter. No second mass lesion identified. Chronic infarct right frontal lobe.  No acute infarct or hemorrhage. Vascular: Normal arterial flow voids. Skull and upper cervical spine: No focal skeletal lesion. Sinuses/Orbits: Mild mucosal edema right maxillary sinus. 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 Chronic infarct right frontal lobe. Electronically Signed   By: Franchot Gallo M.D.   On: 06/21/2020 15:15   CT ABDOMEN PELVIS W CONTRAST  Result Date:  06/21/2020 CLINICAL DATA:  Right-sided weakness for 3-4 days. EXAM: CT ABDOMEN AND PELVIS WITH CONTRAST TECHNIQUE: Multidetector CT imaging of the abdomen and pelvis was performed using the standard protocol following bolus administration of intravenous contrast. CONTRAST:  139mL OMNIPAQUE IOHEXOL 300 MG/ML  SOLN COMPARISON:  None. FINDINGS: Lower chest: A 3.1 cm diameter round noncalcified soft tissue mass is seen within the posteromedial aspect of the right lower lobe. Hepatobiliary: There is diffuse fatty infiltration of the liver parenchyma. No focal liver abnormality is seen. A cluster of subcentimeter gallstones are seen within the gallbladder lumen. There is no evidence of gallbladder wall thickening or biliary dilatation. Pancreas: Unremarkable. No pancreatic ductal dilatation or surrounding inflammatory changes. Spleen: Normal in size without focal abnormality. Adrenals/Urinary Tract: Adrenal glands are unremarkable. Kidneys are normal, without renal calculi or hydronephrosis. A 1.9 cm diameter cyst is seen adjacent to the lateral aspect of the mid left kidney. An 8.0 cm x 4.8 cm area of heterogeneous mildly increased attenuation is seen within the dependent portion of the urinary bladder. Stomach/Bowel: Stomach is within normal limits. Appendix appears normal. No evidence of bowel wall thickening, distention, or inflammatory changes. Noninflamed diverticula are seen within the proximal sigmoid colon. Vascular/Lymphatic: There is marked severity calcification of the abdominal aorta. No enlarged abdominal or pelvic lymph nodes. Reproductive: The prostate gland is mildly enlarged. Other: No abdominal wall hernia or abnormality. No abdominopelvic ascites. Musculoskeletal: Degenerative changes seen within the lower lumbar spine at the level of L5-S1. IMPRESSION: 1. 3.1 cm diameter round noncalcified soft tissue mass within the posteromedial aspect of  the right lower lobe. Further evaluation with chest CT is  recommended, as an underlying neoplasm cannot be excluded. 2. Cholelithiasis. 3. Sigmoid diverticulosis. 4. Large area of heterogeneous increased attenuation within the lumen of the urinary bladder. While this may represent a large amount of blood products, the presence of a urinary bladder mass cannot be excluded. Aortic Atherosclerosis (ICD10-I70.0). Electronically Signed   By: Virgina Norfolk M.D.   On: 06/21/2020 16:42   NM PET Image Initial (PI) Skull Base To Thigh  Result Date: 06/29/2020 CLINICAL DATA:  Initial treatment strategy for lung mass with intracranial metastatic disease. EXAM: NUCLEAR MEDICINE PET SKULL BASE TO THIGH TECHNIQUE: 14.1 mCi F-18 FDG was injected intravenously. Full-ring PET imaging was performed from the skull base to thigh after the radiotracer. CT data was obtained and used for attenuation correction and anatomic localization. Fasting blood glucose: 164 mg/dl COMPARISON:  CT abdomen 06/21/2020 and CT chest 06/17/2020 FINDINGS: Mediastinal blood pool activity: SUV max 2.9 Liver activity: SUV max NA NECK: No significant abnormal hypermetabolic activity in this region. The level of the known left frontal lobe vertex metastatic lesion is not included on today's PET-CT. Incidental CT findings: Chronic right maxillary sinusitis. Bilateral common carotid atherosclerotic calcification. CHEST: The medial right lower lobe mass measures 3.7 by 3.4 cm on image 138/3, has maximum SUV of 13.7, compatible with malignancy. This lesion abuts the visceral pleura without definite pleural invasion. No hypermetabolic or pathologically enlarged adenopathy in the chest. Incidental CT findings: Coronary, aortic arch, and branch vessel atherosclerotic vascular disease. Airway thickening is present, suggesting bronchitis or reactive airways disease. Centrilobular emphysema. ABDOMEN/PELVIS: Focal anal activity is most likely physiologic, maximum SUV 9.9. Scattered likely physiologic bowel activity. No  adrenal mass. No compelling findings of metastatic disease to the abdomen/pelvis. Incidental CT findings: Dependent density in the gallbladder favoring gallstones. Aortoiliac atherosclerotic vascular disease. Subcutaneous calcifications along the right groin region. SKELETON: No significant abnormal hypermetabolic activity in this region. Incidental CT findings: Bony demineralization in the left proximal femur extending down towards the above the knee amputation, likely from disuse and reduced weight-bearing. 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 There is degenerative disc disease at L5-S1. IMPRESSION: 1. 3.7 cm right lower lobe mass is highly hypermetabolic with maximum SUV 30.7, compatible with malignancy. 2. No hypermetabolic adenopathy or evidence of metastatic disease to the abdomen/pelvis or skeleton. The patient has a known likely metastatic lesion to the left frontal vertex of the brain, a level not included on today's PET-CT. 3. Other imaging findings of potential clinical significance: Chronic right maxillary sinusitis. Aortic Atherosclerosis (ICD10-I70.0) and Emphysema (ICD10-J43.9). Coronary atherosclerosis. Airway thickening is present, suggesting bronchitis or reactive airways disease. Cholelithiasis. 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 Electronically Signed   By: Van Clines M.D.   On: 06/29/2020 15:32   DG C-Arm 1-60 Min-No Report  Result Date: 07/01/2020 Fluoroscopy was utilized by the requesting physician.  No radiographic interpretation.   CT OUTSIDE FILMS CHEST  Result Date: 06/22/2020 This examination belongs to an outside facility and is stored here for comparison purposes only.  Contact the originating outside institution for any associated report or interpretation.  DG Outside Films Chest  Result Date: 06/22/2020 This examination belongs to an  outside facility and is stored here for comparison purposes only.  Contact the originating outside institution for any associated report or interpretation.  CT Super D Chest Wo Contrast  Result Date:  07/01/2020 CLINICAL DATA:  Pulmonary nodule.  Pre-procedure planning study. EXAM: CT CHEST WITHOUT CONTRAST TECHNIQUE: Multidetector CT imaging of the chest was performed using thin slice collimation for electromagnetic bronchoscopy planning purposes, without intravenous contrast. COMPARISON:  PET-CT 06/29/2020 FINDINGS: Cardiovascular: The heart size is normal. No substantial pericardial effusion. Coronary artery calcification is evident. Atherosclerotic calcification is noted in the wall of the thoracic aorta. Ascending thoracic aorta measures 4.4 cm diameter. Mediastinum/Nodes: No mediastinal lymphadenopathy. No evidence for gross hilar lymphadenopathy although assessment is limited by the lack of intravenous contrast on today's study. The esophagus has normal imaging features. No evidence for gross hilar lymphadenopathy although assessment is limited by the lack of intravenous contrast on today's study. Lungs/Pleura: Centrilobular and paraseptal emphysema evident. 3.7 cm medial right lower lobe pulmonary mass lesion again noted. No focal airspace consolidation. No pleural effusion. Upper Abdomen: The liver shows diffusely decreased attenuation suggesting fat deposition. Layering tiny calcified gallstones evident. Exophytic lesion upper interpolar left kidney is stable and not hypermetabolic on previous PET-CT. Musculoskeletal: No worrisome lytic or sclerotic osseous abnormality. IMPRESSION: 1. 3.7 cm medial right lower lobe pulmonary mass. 2. No evidence for gross hilar lymphadenopathy although assessment is limited by the lack of intravenous contrast on today's study. 3. 4.4 cm ascending thoracic aortic aneurysm. Recommend annual imaging followup by CTA or MRA. This recommendation follows 2010  ACCF/AHA/AATS/ACR/ASA/SCA/SCAI/SIR/STS/SVM Guidelines for the Diagnosis and Management of Patients with Thoracic Aortic Disease. Circulation. 2010; 121: K481-E563. Aortic aneurysm NOS (ICD10-I71.9) 4. Hepatic steatosis. 5. Cholelithiasis. 6. Aortic Atherosclerosis (ICD10-I70.0) and Emphysema (ICD10-J43.9). Electronically Signed   By: Misty Stanley M.D.   On: 07/01/2020 10:21   Assessment and plan- Patient is a 59 y.o. male who presents to smoking cessation clinic to follow-up on how his efforts to quit smoking ongoing.  Topics covered: Tobacco proof home and car Change in daily routines Dealing with urges to smoke Getting support Anticipating/avoiding triggers Secondhand smoke Behavioral skills Reinforced benefits  Reviewed pharmacotherapy: OTC medications available include:  Nicotine replacement therapy (NRT) gum  NRT lozenge  NRT patch  NRT patch plus combination of gum or lozenge  Medical treatment:  NRT nasal spray: Dosing 1-2 doses per hour (8-40 doses per day); 1 dose equals 1 spray in each nostril; each spray level 0.5 mg of nicotine  NRT oral inhaler: Dosing 6-16 cartridges per day; initially use 1 cartridge every 1-2 hours  Bupropion SR: Dosing: Begin 1 to 2 weeks prior to quit date; 150 mg by mouth every a.m. x3 days, then increase to 150 mg by mouth twice daily.  Contraindications: Head injury, seizures, eating disorders, MAOI inhibitor.  Verenicline: Dosing: Begin 1 week prior to quit date; days 1 through 3 0.5 mg by mouth every a.m.; days 4 through 7; 0.5 mg p.o. twice daily; weeks 2 through 12: 1 mg p.o. twice daily.  Monitor for neuropsychiatric symptoms.  Follow-up plan: Tyler Gallagher has completely stopped smoking and has been tobacco free for approximately 2 weeks.  He used nicotine patches for about 10 days. Not currently using any medications for smoking cessation.   Disposition: No additional follow-up needed and the smoking cessation clinic. Tyler Gallagher can call  clinic with any questions or concerns.  Visit Diagnosis 1. Cigarette nicotine dependence, uncomplicated     Patient expressed understanding and was in agreement with this plan. He also understands that He can call clinic at any time with any questions, concerns, or complaints.   Greater than 50% was spent in counseling and coordination  of care with this patient including but not limited to discussion of the relevant topics above (See A&P) including, but not limited to diagnosis and management of acute and chronic medical conditions.   Thank you for allowing me to participate in the care of this very pleasant patient.    Jacquelin Hawking, NP [place] [facility] Cell - [phone] Pager- [phone] 07/07/2020 2:46 PM

## 2020-07-08 DIAGNOSIS — C3431 Malignant neoplasm of lower lobe, right bronchus or lung: Secondary | ICD-10-CM | POA: Insufficient documentation

## 2020-07-08 DIAGNOSIS — Z7189 Other specified counseling: Secondary | ICD-10-CM | POA: Insufficient documentation

## 2020-07-08 NOTE — Progress Notes (Signed)
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  CTA showed no pulmonary embolism but a 3.8 x 3.8 cm medial right lower lobe mass abutting the right paravertebral pleural margin.  No involvement of adjacent vertebral bodies and consistent with primary lung malignancy.  He then presented to ER with symptoms of increasing weakness over the right side.  He underwent MRI brain with and without contrast at [facility] which showed a solitary metastatic deposit in the left medial frontal lobe with surrounding vasogenic edema.  Also had a CT abdomen which showed large area of heterogeneous attenuation within the lumen of the urinary bladder.  While this may represent a large amount of blood products the presence of urinary bladder mass not excluded    PET CT scan showed 3.7 cm right lower lobe mass with an SUV of 30.7.  The mass abuts the visceral pleura without definite pleural invasion.  No hypermetabolic adenopathy or evidence of distant metastatic disease.  Patient underwent bronchoscopy with [name] which showed non-small cell lung cancer favor adenocarcinoma  Interval history-patient reports some ongoing right lower extremity weakness which is overall stable.  Mildly improved  in fact with steroids.  Reports chronic fatigue  ECOG PS- 2 Pain scale- 0   Review of systems- Review of Systems  Constitutional: Positive for malaise/fatigue.  Musculoskeletal:       Right leg weakness      Allergies  Allergen Reactions  . Penicillins      Past Medical History:  Diagnosis Date  . Diabetes mellitus without complication ([place])   . Hyperlipidemia   . Hypertension   . Lesion of brain   . Mass of lung      Past Surgical History:  Procedure Laterality Date  . 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 COLONOSCOPY    . VIDEO BRONCHOSCOPY WITH ENDOBRONCHIAL NAVIGATION N/A 07/01/2020   Procedure: VIDEO BRONCHOSCOPY WITH ENDOBRONCHIAL NAVIGATION;  Surgeon: [name], MD;  Location: [facility] ORS;  Service: Thoracic;  Laterality: N/A;  . VIDEO BRONCHOSCOPY WITH ENDOBRONCHIAL ULTRASOUND N/A 07/01/2020   Procedure: VIDEO BRONCHOSCOPY WITH ENDOBRONCHIAL ULTRASOUND;  Surgeon: [name], MD;  Location: [facility] ORS;  Service: Thoracic;  Laterality: N/A;    Social History   Socioeconomic History  . Marital status: Divorced    Spouse name: Not on file  . Number of children: Not on file  . Years of education: Not on file  . Highest education level: Not on file  Occupational History  . Not on file  Tobacco Use  . Smoking status: Current Every Day Smoker    Packs/day: 2.00    Years: 32.00    Pack years: 64.00    Types: Cigarettes  . Smokeless tobacco: Never Used  . Tobacco comment: trying to stop - not smoked in two weeks  Vaping Use  .  Vaping Use: Never used  Substance and Sexual Activity  . Alcohol use: No    Alcohol/week: 0.0 standard drinks  . Drug use: Yes    Types: Marijuana    Comment: two weeks  . Sexual activity: Not on file  Other Topics Concern  . Not on file  Social History Narrative  . Not on file   Social Determinants of Health   Financial Resource Strain:   . Difficulty of Paying Living Expenses:   Food Insecurity:   . Worried About Paediatric nurse in the Last Year:   . Arboriculturist in the Last Year:   Transportation Needs:   . Film/video editor (Medical):   Marland Kitchen Lack of Transportation (Non-Medical):   Physical Activity:   . Days of Exercise per Week:   . Minutes of Exercise per Session:   Stress:   . Feeling of Stress :   Social Connections:   . Frequency of Communication with Friends and Family:   . Frequency of Social Gatherings with Friends and Family:   . Attends Religious Services:   . Active Member of Clubs or Organizations:   . Attends Archivist Meetings:   Marland Kitchen Marital Status:   Intimate Partner Violence:   . Fear of Current or Ex-Partner:   . Emotionally Abused:   Marland Kitchen Physically Abused:   . Sexually Abused:     Family History  Problem Relation Age of Onset  . Diabetes Sister   . Diabetes Brother      Current Outpatient Medications:  .  atorvastatin (LIPITOR) 10 MG tablet, Take 10 mg by mouth daily., Disp: , Rfl:  .  Cyanocobalamin (RA VITAMIN B-12 TR) 1000 MCG TBCR, Take 1 tablet by mouth daily. , Disp: , Rfl:  .  dexamethasone (DECADRON) 4 MG tablet, Take 2 tablets (8 mg total) by mouth 2 (two) times daily., Disp: 60 tablet, Rfl: 1 .  gabapentin (NEURONTIN) 300 MG capsule, Take 900 mg by mouth 3 (three) times daily., Disp: , Rfl:  .  gemfibrozil (LOPID) 600 MG tablet, Take 0.5 tablets by mouth 2 (two) times daily., Disp: , Rfl:  .  lisinopril-hydrochlorothiazide (ZESTORETIC) 10-12.5 MG tablet, Take 1 tablet by mouth daily., Disp: , Rfl:  .  LORazepam (ATIVAN) 0.5 MG tablet, Take 1 tablet (0.5 mg total) by mouth every 8 (eight) hours as needed for anxiety., Disp: 30 tablet, Rfl: 0 .  metFORMIN (GLUCOPHAGE-XR) 500 MG 24 hr tablet, Take 500 mg by mouth 2 (two) times daily., Disp: , Rfl:  .  pioglitazone (ACTOS) 30 MG tablet, Take 30 mg by mouth daily., Disp: , Rfl:  .  pantoprazole (PROTONIX) 20 MG tablet, Take 1 tablet (20 mg total) by mouth daily., Disp: 30 tablet, Rfl: 1  Physical  exam:  Vitals:   07/07/20 1429  BP: 129/79  Pulse: 73  Resp: 18  Temp: 98.1 F (36.7 C)  TempSrc: Tympanic  SpO2: 96%  Weight: 260 lb (117.9 kg)   Physical Exam Constitutional:      General: He is not in acute distress.    Comments: He is sitting in a wheelchair.  Appears in no acute distress  Cardiovascular:     Rate and Rhythm: Normal rate and regular rhythm.     Heart sounds: Normal heart sounds.  Pulmonary:     Effort: Pulmonary effort is normal.  Musculoskeletal:     Comments: He is s/p left AKA  Skin:    General: Skin is  warm and dry.  Neurological:     Mental Status: He is alert and oriented to person, place, and time.     Comments: Motor power in right lower extremity 4/5      CMP Latest Ref Rng & Units 06/21/2020  Glucose 70 - 99 mg/dL 207(H)  BUN 6 - 20 mg/dL 9  Creatinine 0.61 - 1.24 mg/dL 0.71  Sodium 135 - 145 mmol/L 136  Potassium 3.5 - 5.1 mmol/L 3.5  Chloride 98 - 111 mmol/L 97(L)  CO2 22 - 32 mmol/L 27  Calcium 8.9 - 10.3 mg/dL 9.7  Total Protein 6.5 - 8.1 g/dL 7.7  Total Bilirubin 0.3 - 1.2 mg/dL 0.7  Alkaline Phos 38 - 126 U/L 90  AST 15 - 41 U/L 30  ALT 0 - 44 U/L 42   CBC Latest Ref Rng & Units 06/21/2020  WBC 4.0 - 10.5 K/uL 7.6  Hemoglobin 13.0 - 17.0 g/dL 15.9  Hematocrit 39 - 52 % 45.4  Platelets 150 - 400 K/uL 229    No images are attached to the encounter.  CT HEAD WO CONTRAST  Result Date: 06/21/2020 CLINICAL DATA:  Right-sided weakness EXAM: CT HEAD WITHOUT CONTRAST TECHNIQUE: Contiguous axial images were obtained from the base of the skull through the vertex without intravenous contrast. COMPARISON:  2018 FINDINGS: Brain: There is no acute intracranial hemorrhage. Hypoattenuation is present in the high left frontal white matter reflecting vasogenic edema. There is mild regional mass effect. Probable underlying 1.3 x 0.6 x 1.6 cm lesion of the left superior frontal gyrus. Chronic right frontal infarct. Gray-white differentiation is  otherwise preserved. There is no extra-axial fluid collection. Ventricles and sulci are within normal limits in size and configuration. Vascular: No hyperdense vessel or unexpected calcification. Skull: Calvarium is unremarkable. Sinuses/Orbits: No acute finding. Other: None. IMPRESSION: Vasogenic edema in the left frontal lobe associated with a 1.6 cm lesion. This likely reflects a metastasis given lung mass. Contrast enhanced MRI is recommended to exclude additional lesions. No acute intracranial hemorrhage or significant mass effect. Electronically Signed   By: Macy Mis M.D.   On: 06/21/2020 13:09   MR Brain W and Wo Contrast  Result Date: 06/21/2020 CLINICAL DATA:  Brain mass.  Lung mass. EXAM: MRI HEAD WITHOUT AND WITH CONTRAST TECHNIQUE: Multiplanar, multiecho pulse sequences of the brain and surrounding structures were obtained without and with intravenous contrast. CONTRAST:  73mL GADAVIST GADOBUTROL 1 MMOL/ML IV SOLN COMPARISON:  CT head 06/21/2020 FINDINGS: Brain: Enhancing mass lesion left medial frontal cortex. The enhancing mass measures 19 x 16 mm and has central necrosis and moderate surrounding vasogenic edema in the white matter. No second mass lesion identified. Chronic infarct right frontal lobe.  No acute infarct or hemorrhage. Vascular: Normal arterial flow voids. Skull and upper cervical spine: No focal skeletal lesion. Sinuses/Orbits: Mild mucosal edema right maxillary sinus. 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 Chronic infarct right frontal lobe. Electronically Signed   By: Franchot Gallo M.D.   On: 06/21/2020 15:15   CT ABDOMEN PELVIS W CONTRAST  Result Date: 06/21/2020 CLINICAL DATA:  Right-sided weakness for 3-4 days. EXAM: CT ABDOMEN AND PELVIS WITH CONTRAST TECHNIQUE: Multidetector CT imaging of the abdomen and pelvis was performed using the standard protocol following bolus  administration of intravenous contrast. CONTRAST:  117mL OMNIPAQUE IOHEXOL 300 MG/ML  SOLN COMPARISON:  None. FINDINGS: Lower chest: A 3.1 cm diameter round noncalcified soft tissue mass  is seen within the posteromedial aspect of the right lower lobe. Hepatobiliary: There is diffuse fatty infiltration of the liver parenchyma. No focal liver abnormality is seen. A cluster of subcentimeter gallstones are seen within the gallbladder lumen. There is no evidence of gallbladder wall thickening or biliary dilatation. Pancreas: Unremarkable. No pancreatic ductal dilatation or surrounding inflammatory changes. Spleen: Normal in size without focal abnormality. Adrenals/Urinary Tract: Adrenal glands are unremarkable. Kidneys are normal, without renal calculi or hydronephrosis. A 1.9 cm diameter cyst is seen adjacent to the lateral aspect of the mid left kidney. An 8.0 cm x 4.8 cm area of heterogeneous mildly increased attenuation is seen within the dependent portion of the urinary bladder. Stomach/Bowel: Stomach is within normal limits. Appendix appears normal. No evidence of bowel wall thickening, distention, or inflammatory changes. Noninflamed diverticula are seen within the proximal sigmoid colon. Vascular/Lymphatic: There is marked severity calcification of the abdominal aorta. No enlarged abdominal or pelvic lymph nodes. Reproductive: The prostate gland is mildly enlarged. Other: No abdominal wall hernia or abnormality. No abdominopelvic ascites. Musculoskeletal: Degenerative changes seen within the lower lumbar spine at the level of L5-S1. IMPRESSION: 1. 3.1 cm diameter round noncalcified soft tissue mass within the posteromedial aspect of the right lower lobe. Further evaluation with chest CT is recommended, as an underlying neoplasm cannot be excluded. 2. Cholelithiasis. 3. Sigmoid diverticulosis. 4. Large area of heterogeneous increased attenuation within the lumen of the urinary bladder. While this may represent a  large amount of blood products, the presence of a urinary bladder mass cannot be excluded. Aortic Atherosclerosis (ICD10-I70.0). Electronically Signed   By: Virgina Norfolk M.D.   On: 06/21/2020 16:42   NM PET Image Initial (PI) Skull Base To Thigh  Result Date: 06/29/2020 CLINICAL DATA:  Initial treatment strategy for lung mass with intracranial metastatic disease. EXAM: NUCLEAR MEDICINE PET SKULL BASE TO THIGH TECHNIQUE: 14.1 mCi F-18 FDG was injected intravenously. Full-ring PET imaging was performed from the skull base to thigh after the radiotracer. CT data was obtained and used for attenuation correction and anatomic localization. Fasting blood glucose: 164 mg/dl COMPARISON:  CT abdomen 06/21/2020 and CT chest 06/17/2020 FINDINGS: Mediastinal blood pool activity: SUV max 2.9 Liver activity: SUV max NA NECK: No significant abnormal hypermetabolic activity in this region. The level of the known left frontal lobe vertex metastatic lesion is not included on today's PET-CT. Incidental CT findings: Chronic right maxillary sinusitis. Bilateral common carotid atherosclerotic calcification. CHEST: The medial right lower lobe mass measures 3.7 by 3.4 cm on image 138/3, has maximum SUV of 13.7, compatible with malignancy. This lesion abuts the visceral pleura without definite pleural invasion. No hypermetabolic or pathologically enlarged adenopathy in the chest. Incidental CT findings: Coronary, aortic arch, and branch vessel atherosclerotic vascular disease. Airway thickening is present, suggesting bronchitis or reactive airways disease. Centrilobular emphysema. ABDOMEN/PELVIS: Focal anal activity is most likely physiologic, maximum SUV 9.9. Scattered likely physiologic bowel activity. No adrenal mass. No compelling findings of metastatic disease to the abdomen/pelvis. Incidental CT findings: Dependent density in the gallbladder favoring gallstones. Aortoiliac atherosclerotic vascular disease. Subcutaneous  calcifications along the right groin region. SKELETON: No significant abnormal hypermetabolic activity in this region. Incidental CT findings: Bony demineralization in the left proximal femur extending down towards the above the knee amputation, likely from disuse and reduced weight-bearing. 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 There is degenerative disc disease at L5-S1. IMPRESSION: 1. 3.7 cm right lower lobe mass is highly hypermetabolic with maximum  SUV 30.7, compatible with malignancy. 2. No hypermetabolic adenopathy or evidence of metastatic disease to the abdomen/pelvis or skeleton. The patient has a known likely metastatic lesion to the left frontal vertex of the brain, a level not included on today's PET-CT. 3. Other imaging findings of potential clinical significance: Chronic right maxillary sinusitis. Aortic Atherosclerosis (ICD10-I70.0) and Emphysema (ICD10-J43.9). Coronary atherosclerosis. Airway thickening is present, suggesting bronchitis or reactive airways disease. Cholelithiasis. 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 Electronically Signed   By: Van Clines M.D.   On: 06/29/2020 15:32   DG C-Arm 1-60 Min-No Report  Result Date: 07/01/2020 Fluoroscopy was utilized by the requesting physician.  No radiographic interpretation.   CT OUTSIDE FILMS CHEST  Result Date: 06/22/2020 This examination belongs to an outside facility and is stored here for comparison purposes only.  Contact the originating outside institution for any associated report or interpretation.  DG Outside Films Chest  Result Date: 06/22/2020 This examination belongs to an outside facility and is stored here for comparison purposes only.  Contact the originating outside institution for any associated report or interpretation.  CT Super D Chest Wo Contrast  Result Date: 07/01/2020 CLINICAL DATA:   Pulmonary nodule.  Pre-procedure planning study. EXAM: CT CHEST WITHOUT CONTRAST TECHNIQUE: Multidetector CT imaging of the chest was performed using thin slice collimation for electromagnetic bronchoscopy planning purposes, without intravenous contrast. COMPARISON:  PET-CT 06/29/2020 FINDINGS: Cardiovascular: The heart size is normal. No substantial pericardial effusion. Coronary artery calcification is evident. Atherosclerotic calcification is noted in the wall of the thoracic aorta. Ascending thoracic aorta measures 4.4 cm diameter. Mediastinum/Nodes: No mediastinal lymphadenopathy. No evidence for gross hilar lymphadenopathy although assessment is limited by the lack of intravenous contrast on today's study. The esophagus has normal imaging features. No evidence for gross hilar lymphadenopathy although assessment is limited by the lack of intravenous contrast on today's study. Lungs/Pleura: Centrilobular and paraseptal emphysema evident. 3.7 cm medial right lower lobe pulmonary mass lesion again noted. No focal airspace consolidation. No pleural effusion. Upper Abdomen: The liver shows diffusely decreased attenuation suggesting fat deposition. Layering tiny calcified gallstones evident. Exophytic lesion upper interpolar left kidney is stable and not hypermetabolic on previous PET-CT. Musculoskeletal: No worrisome lytic or sclerotic osseous abnormality. IMPRESSION: 1. 3.7 cm medial right lower lobe pulmonary mass. 2. No evidence for gross hilar lymphadenopathy although assessment is limited by the lack of intravenous contrast on today's study. 3. 4.4 cm ascending thoracic aortic aneurysm. Recommend annual imaging followup by CTA or MRA. This recommendation follows 2010 ACCF/AHA/AATS/ACR/ASA/SCA/SCAI/SIR/STS/SVM Guidelines for the Diagnosis and Management of Patients with Thoracic Aortic Disease. Circulation. 2010; 121: E993-Z169. Aortic aneurysm NOS (ICD10-I71.9) 4. Hepatic steatosis. 5. Cholelithiasis. 6.  Aortic Atherosclerosis (ICD10-I70.0) and Emphysema (ICD10-J43.9). Electronically Signed   By: Misty Stanley M.D.   On: 07/01/2020 10:21     Assessment and plan- Patient is a 59 y.o. male with newly diagnosed stage IVa c T2 c N0 c M1 adenocarcinoma of the right lung with isolated left frontal lobe metastases as well as bladder mass.  He is here to discuss PET CT scan and pathology results and further management  1.  With regards to lung cancer: I spoke with Dr. [name] on 07/08/2020 the day after the patient's visit and plan is for patient to go for resection of the left frontal lobe metastases tentatively on 07/18/2020.  He will then receive radiation treatment to the resection cavity a few weeks after  surgery With regards to the primary right lower lobe lung mass this is a T2 lesion 3.8 cm without any frank visceral pleural invasion and no associated hilar or mediastinal adenopathy.  He is not a surgical candidate for resection and will therefore be undergoing definitive radiation treatment for this mass.  I will hold off on giving him concurrent chemotherapy for this at this time.  I am inclined to continue surveillance scans for his lung cancer every 3 months and treat him at progression.  We will send off NGS testing on his tumor specimen.  Treatment is being given with a palliative intent  2.  Patient also noted to have a bladder mass which will be evaluated by Dr. Erlene Quan and she will be seeing him next week.  If he is found to have muscle invasive bladder cancer he may need concurrent chemoradiation for that.  At this time I will await urology evaluation to decide further management  3.  Patient will continue to remain on steroids for his brain metastases until surgery  I will tentatively see him on 08/11/20.   Visit Diagnosis 1. Brain metastasis ([place])   2. Malignant neoplasm of lower lobe of right lung ([place])   3. Goals of care, counseling/discussion   4. Bladder mass      Dr. Randa Evens, MD,  [facility] [phone] 07/08/2020 12:34 PM

## 2020-07-12 NOTE — H&P (View-Only) (Signed)
07/13/2020 9:57 AM   Tyler Gallagher 1961-06-09 [id number]  Referring provider: [name], MD [place],  [place] [address] Chief Complaint  Patient presents with  . bladder mass    HPI: Tyler Gallagher is a 59 y.o. male with a bladder mass who is seen for an assessment an management.   The patient is being followed by 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   Patient has a longstanding history of smoking initially underwent CTA chest with contrast on 06/17/2020 at [facility] when he presented with symptoms of shortness of breath. CTA showed no pulmonary embolism but a 3.8 x 3.8 cm medial right lower lobe mass abutting the right paravertebral pleural margin. No involvement of adjacent vertebral bodies and consistent with primary lung malignancy.   He then presented to ER with symptoms of increasing weakness over the right side. He underwent MRI brain with and without contrast at [facility] which showed a solitary metastatic deposit in the left medial frontal lobe with surrounding vasogenic edema. Also had a CT abdomen which showed large area of heterogeneous attenuation within the lumen of the urinary bladder. While this may represent a large amount of blood products the presence of urinary bladder mass not excluded  PET CT scan on 06/29/2020 showed 3.7 cm right lower lobe mass with an SUV of 30.7.  The mass abuts the visceral pleura without definite pleural invasion.  No hypermetabolic adenopathy or evidence of distant metastatic disease.    Patient underwent bronchoscopy with [name] on 07/01/2020 which showed non-small cell lung cancer favor adenocarcinoma.  He had an initial consult with [name] on 07/07/2020. He planned for [facility] to his brain lesion. Offered SBRT to his right lower lobe mass 6000 cGy in 5 fractions.   He is scheduled for a  craniectomy with Dr. [name] on 07/18/2020.  He has an extensive smoking history.   Denies hematuria.   He is not on any blood thinners as he was directed to hold them for now.   PMH: Past Medical History:  Diagnosis Date  . Diabetes mellitus without complication ([place])   . Hyperlipidemia   . Hypertension   . Lesion of brain   . Mass of lung     Surgical History: Past Surgical History:  Procedure Laterality Date  . 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 COLONOSCOPY    . VIDEO BRONCHOSCOPY WITH ENDOBRONCHIAL NAVIGATION N/A 07/01/2020   Procedure: VIDEO BRONCHOSCOPY WITH ENDOBRONCHIAL NAVIGATION;  Surgeon: [name], MD;  Location: [facility] ORS;  Service: Thoracic;  Laterality: N/A;  . VIDEO BRONCHOSCOPY WITH ENDOBRONCHIAL ULTRASOUND N/A 07/01/2020   Procedure: VIDEO BRONCHOSCOPY WITH ENDOBRONCHIAL ULTRASOUND;  Surgeon: [name], MD;  Location: [facility] ORS;  Service: Thoracic;  Laterality: N/A;    Home Medications:  Allergies as of 07/13/2020      Reactions   Penicillins Other (See Comments)   unknown      Medication List       Accurate as of July 13, 2020 11:59 PM. If you have any questions, ask your nurse or doctor.        atorvastatin 10 MG tablet Commonly known as: LIPITOR Take 10 mg by mouth every morning.   dexamethasone 4 MG tablet Commonly known as: DECADRON Take 2 tablets (8 mg total) by mouth 2 (two) times daily. What changed: how  much to take   gabapentin 300 MG capsule Commonly known as: NEURONTIN Take 900 mg by mouth 3 (three) times daily.   gemfibrozil 600 MG tablet Commonly known as: LOPID Take 300 mg by mouth 2 (two) times daily.   lisinopril-hydrochlorothiazide 10-12.5 MG tablet Commonly known as: ZESTORETIC Take 1 tablet by mouth daily.   LORazepam 0.5 MG tablet Commonly known as: ATIVAN Take 1 tablet (0.5 mg total) by mouth every 8 (eight) hours as needed for anxiety.   metFORMIN 500 MG 24 hr tablet Commonly known as: GLUCOPHAGE-XR Take 500  mg by mouth in the morning, at noon, in the evening, and at bedtime.   pantoprazole 20 MG tablet Commonly known as: Protonix Take 1 tablet (20 mg total) by mouth daily.   RA Vitamin B-12 TR 1000 MCG Tbcr Generic drug: Cyanocobalamin Take 1,000 mcg by mouth in the morning and at bedtime.       Allergies:  Allergies  Allergen Reactions  . Penicillins Other (See Comments)    unknown    Family History: Family History  Problem Relation Age of Onset  . Diabetes Sister   . Diabetes Brother     Social History:  reports that he has been smoking cigarettes. He has a 64.00 pack-year smoking history. He has never used smokeless tobacco. He reports current drug use. Drug: Marijuana. He reports that he does not drink alcohol.   Physical Exam: BP (!) 143/85   Pulse 78   Wt 260 lb (117.9 kg)   BMI 31.65 kg/m   Constitutional:  Alert and oriented, No acute distress. HEENT: [place] AT, moist mucus membranes.  Trachea midline, no masses. Cardiovascular: No clubbing, cyanosis, or edema. Respiratory: Normal respiratory effort, no increased work of breathing. Skin: No rashes, bruises or suspicious lesions. Neurologic: Grossly intact, no focal deficits, moving all 4 extremities. Psychiatric: Normal mood and affect.  Laboratory Data:  Lab Results  Component Value Date   CREATININE 0.71 06/21/2020    Lab Results  Component Value Date   HGBA1C 7.7 (H) 06/21/2020    Urinalysis UA reviewed, no blood  CLINICAL DATA:  Right-sided weakness for 3-4 days.  EXAM: CT ABDOMEN AND PELVIS WITH CONTRAST  TECHNIQUE: Multidetector CT imaging of the abdomen and pelvis was performed using the standard protocol following bolus administration of intravenous contrast.  CONTRAST:  162m OMNIPAQUE IOHEXOL 300 MG/ML  SOLN  COMPARISON:  None.  FINDINGS: Lower chest: A 3.1 cm diameter round noncalcified soft tissue mass is seen within the posteromedial aspect of the right lower  lobe.  Hepatobiliary: There is diffuse fatty infiltration of the liver parenchyma. No focal liver abnormality is seen. A cluster of subcentimeter gallstones are seen within the gallbladder lumen. There is no evidence of gallbladder wall thickening or biliary dilatation.  Pancreas: Unremarkable. No pancreatic ductal dilatation or surrounding inflammatory changes.  Spleen: Normal in size without focal abnormality.  Adrenals/Urinary Tract: Adrenal glands are unremarkable. Kidneys are normal, without renal calculi or hydronephrosis. A 1.9 cm diameter cyst is seen adjacent to the lateral aspect of the mid left kidney.  An 8.0 cm x 4.8 cm area of heterogeneous mildly increased attenuation is seen within the dependent portion of the urinary bladder.  Stomach/Bowel: Stomach is within normal limits. Appendix appears normal. No evidence of bowel wall thickening, distention, or inflammatory changes. Noninflamed diverticula are seen within the proximal sigmoid colon.  Vascular/Lymphatic: There is marked severity calcification of the abdominal aorta. No enlarged abdominal or pelvic lymph nodes.  Reproductive: The  prostate gland is mildly enlarged.  Other: No abdominal wall hernia or abnormality. No abdominopelvic ascites.  Musculoskeletal: Degenerative changes seen within the lower lumbar spine at the level of L5-S1.  IMPRESSION: 1. 3.1 cm diameter round noncalcified soft tissue mass within the posteromedial aspect of the right lower lobe. Further evaluation with chest CT is recommended, as an underlying neoplasm cannot be excluded. 2. Cholelithiasis. 3. Sigmoid diverticulosis. 4. Large area of heterogeneous increased attenuation within the lumen of the urinary bladder. While this may represent a large amount of blood products, the presence of a urinary bladder mass cannot be excluded.  Aortic Atherosclerosis (ICD10-I70.0).   Electronically Signed   By: Virgina Norfolk M.D.   On: 06/21/2020 16:42  CT scan was personally reviewed today.  Large bladder lesion, in the absence of gross hematuria unlikely related to blood products.  Assessment & Plan:    1. Bladder mass Highly suspicious for tumor based on CT scan findings  Lengthy discussion today of a differential diagnosis.  Differential diagnosis includes metastatic disease to the bladder versus second primary bladder malignancy.  Strongly recommend pathologic evaluation in order to differentiate between the 2 as this would change care.  If this is in fact a secondary bladder malignancy, grade and stage would be helpful in determining chemo rads for the second primary are appropriate.  He is not a cystectomy candidate.  Will recommend TURBT primarily for diagnostic purposes  Case was previously reviewed with [name].  Risk of surgery including risk of bleeding, infection, damage to any structures amongst other words were all reviewed.  He understands the risk of irritative voiding symptoms.  Not currently on anticoagulation.  He is scheduled for resection of a solitary brain met on Monday at [facility].  He will be helpful to go and have this biopsy done prior to this so that treatment plan can be coordinated and organized ASAP once he is recovered.  He is agreeable this plan, will plan to add him on to the OR tomorrow.   2. Stage 4 adenocarcinoma  As above    [place] [address], [place], [place] [address] [phone]  I, Selena Batten, am acting as a scribe for Dr. Hollice Espy.  I have reviewed the above documentation for accuracy and completeness, and I agree with the above.   Hollice Espy, MD  I spent 60 total minutes on the day of the encounter including pre-visit review of the medical record, face-to-face time with the patient, and post visit ordering of labs/imaging/tests.

## 2020-07-12 NOTE — Progress Notes (Signed)
07/13/2020 9:57 AM   Tyler Gallagher 23-Apr-1961 [id number]  Referring provider: [name], MD [place],  [place] [address] Chief Complaint  Patient presents with   bladder mass    HPI: Tyler Gallagher is a 59 y.o. male with a bladder mass who is seen for an assessment an management.   The patient is being followed by 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   Patient has a longstanding history of smoking initially underwent CTA chest with contrast on 06/17/2020 at [facility] when he presented with symptoms of shortness of breath. CTA showed no pulmonary embolism but a 3.8 x 3.8 cm medial right lower lobe mass abutting the right paravertebral pleural margin. No involvement of adjacent vertebral bodies and consistent with primary lung malignancy.   He then presented to ER with symptoms of increasing weakness over the right side. He underwent MRI brain with and without contrast at [facility] which showed a solitary metastatic deposit in the left medial frontal lobe with surrounding vasogenic edema. Also had a CT abdomen which showed large area of heterogeneous attenuation within the lumen of the urinary bladder. While this may represent a large amount of blood products the presence of urinary bladder mass not excluded  PET CT scan on 06/29/2020 showed 3.7 cm right lower lobe mass with an SUV of 30.7.  The mass abuts the visceral pleura without definite pleural invasion.  No hypermetabolic adenopathy or evidence of distant metastatic disease.    Patient underwent bronchoscopy with [name] on 07/01/2020 which showed non-small cell lung cancer favor adenocarcinoma.  He had an initial consult with [name] on 07/07/2020. He planned for [facility] to his brain lesion. Offered SBRT to his right lower lobe mass 6000 cGy in 5 fractions.   He is scheduled for a  craniectomy with Dr. [name] on 07/18/2020.  He has an extensive smoking history.   Denies hematuria.   He is not on any blood thinners as he was directed to hold them for now.   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   COLONOSCOPY     VIDEO BRONCHOSCOPY WITH ENDOBRONCHIAL NAVIGATION N/A 07/01/2020   Procedure: VIDEO BRONCHOSCOPY WITH ENDOBRONCHIAL NAVIGATION;  Surgeon: [name], MD;  Location: [facility] ORS;  Service: Thoracic;  Laterality: N/A;   VIDEO BRONCHOSCOPY WITH ENDOBRONCHIAL ULTRASOUND N/A 07/01/2020   Procedure: VIDEO BRONCHOSCOPY WITH ENDOBRONCHIAL ULTRASOUND;  Surgeon: [name], MD;  Location: [facility] ORS;  Service: Thoracic;  Laterality: N/A;    Home Medications:  Allergies as of 07/13/2020      Reactions   Penicillins Other (See Comments)   unknown      Medication List       Accurate as of July 13, 2020 11:59 PM. If you have any questions, ask your nurse or doctor.        atorvastatin 10 MG tablet Commonly known as: LIPITOR Take 10 mg by mouth every morning.   dexamethasone 4 MG tablet Commonly known as: DECADRON Take 2 tablets (8 mg total) by mouth 2 (two) times daily. What changed: how  much to take   gabapentin 300 MG capsule Commonly known as: NEURONTIN Take 900 mg by mouth 3 (three) times daily.   gemfibrozil 600 MG tablet Commonly known as: LOPID Take 300 mg by mouth 2 (two) times daily.   lisinopril-hydrochlorothiazide 10-12.5 MG tablet Commonly known as: ZESTORETIC Take 1 tablet by mouth daily.   LORazepam 0.5 MG tablet Commonly known as: ATIVAN Take 1 tablet (0.5 mg total) by mouth every 8 (eight) hours as needed for anxiety.   metFORMIN 500 MG 24 hr tablet Commonly known as: GLUCOPHAGE-XR Take 500  mg by mouth in the morning, at noon, in the evening, and at bedtime.   pantoprazole 20 MG tablet Commonly known as: Protonix Take 1 tablet (20 mg total) by mouth daily.   RA Vitamin B-12 TR 1000 MCG Tbcr Generic drug: Cyanocobalamin Take 1,000 mcg by mouth in the morning and at bedtime.       Allergies:  Allergies  Allergen Reactions   Penicillins Other (See Comments)    unknown    Family History: Family History  Problem Relation Age of Onset   Diabetes Sister    Diabetes Brother     Social History:  reports that he has been smoking cigarettes. He has a 64.00 pack-year smoking history. He has never used smokeless tobacco. He reports current drug use. Drug: Marijuana. He reports that he does not drink alcohol.   Physical Exam: BP (!) 143/85    Pulse 78    Wt 260 lb (117.9 kg)    BMI 31.65 kg/m   Constitutional:  Alert and oriented, No acute distress. HEENT: [place] AT, moist mucus membranes.  Trachea midline, no masses. Cardiovascular: No clubbing, cyanosis, or edema. Respiratory: Normal respiratory effort, no increased work of breathing. Skin: No rashes, bruises or suspicious lesions. Neurologic: Grossly intact, no focal deficits, moving all 4 extremities. Psychiatric: Normal mood and affect.  Laboratory Data:  Lab Results  Component Value Date   CREATININE 0.71 06/21/2020    Lab Results  Component Value Date   HGBA1C 7.7 (H) 06/21/2020    Urinalysis UA reviewed, no blood  CLINICAL DATA:  Right-sided weakness for 3-4 days.  EXAM: CT ABDOMEN AND PELVIS WITH CONTRAST  TECHNIQUE: Multidetector CT imaging of the abdomen and pelvis was performed using the standard protocol following bolus administration of intravenous contrast.  CONTRAST:  145m OMNIPAQUE IOHEXOL 300 MG/ML  SOLN  COMPARISON:  None.  FINDINGS: Lower chest: A 3.1 cm diameter round noncalcified soft tissue mass is seen within the posteromedial aspect of the right lower  lobe.  Hepatobiliary: There is diffuse fatty infiltration of the liver parenchyma. No focal liver abnormality is seen. A cluster of subcentimeter gallstones are seen within the gallbladder lumen. There is no evidence of gallbladder wall thickening or biliary dilatation.  Pancreas: Unremarkable. No pancreatic ductal dilatation or surrounding inflammatory changes.  Spleen: Normal in size without focal abnormality.  Adrenals/Urinary Tract: Adrenal glands are unremarkable. Kidneys are normal, without renal calculi or hydronephrosis. A 1.9 cm diameter cyst is seen adjacent to the lateral aspect of the mid left kidney.  An 8.0 cm x 4.8 cm area of heterogeneous mildly increased attenuation is seen within the dependent portion of the urinary bladder.  Stomach/Bowel: Stomach is within normal limits. Appendix appears normal. No evidence of bowel wall thickening, distention, or inflammatory changes. Noninflamed diverticula are seen within the proximal sigmoid colon.  Vascular/Lymphatic: There is marked severity calcification of the abdominal aorta. No enlarged abdominal or pelvic lymph nodes.  Reproductive: The prostate gland is mildly enlarged.  Other: No abdominal wall hernia or abnormality. No abdominopelvic ascites.  Musculoskeletal: Degenerative changes seen within the lower lumbar spine at the level of L5-S1.  IMPRESSION: 1. 3.1 cm diameter round noncalcified soft tissue mass within the posteromedial aspect of the right lower lobe. Further evaluation with chest CT is recommended, as an underlying neoplasm cannot be excluded. 2. Cholelithiasis. 3. Sigmoid diverticulosis. 4. Large area of heterogeneous increased attenuation within the lumen of the urinary bladder. While this may represent a large amount of blood products, the presence of a urinary bladder mass cannot be excluded.  Aortic Atherosclerosis (ICD10-I70.0).   Electronically Signed   By: Virgina Norfolk M.D.   On: 06/21/2020 16:42  CT scan was personally reviewed today.  Large bladder lesion, in the absence of gross hematuria unlikely related to blood products.  Assessment & Plan:    1. Bladder mass Highly suspicious for tumor based on CT scan findings  Lengthy discussion today of a differential diagnosis.  Differential diagnosis includes metastatic disease to the bladder versus second primary bladder malignancy.  Strongly recommend pathologic evaluation in order to differentiate between the 2 as this would change care.  If this is in fact a secondary bladder malignancy, grade and stage would be helpful in determining chemo rads for the second primary are appropriate.  He is not a cystectomy candidate.  Will recommend TURBT primarily for diagnostic purposes  Case was previously reviewed with [name].  Risk of surgery including risk of bleeding, infection, damage to any structures amongst other words were all reviewed.  He understands the risk of irritative voiding symptoms.  Not currently on anticoagulation.  He is scheduled for resection of a solitary brain met on Monday at [facility].  He will be helpful to go and have this biopsy done prior to this so that treatment plan can be coordinated and organized ASAP once he is recovered.  He is agreeable this plan, will plan to add him on to the OR tomorrow.   2. Stage 4 adenocarcinoma  As above    [facility] [address], [place],  [address] [phone]  I, Selena Batten, am acting as a scribe for Dr. Hollice Espy.  I have reviewed the above documentation for accuracy and completeness, and I agree with the above.   Hollice Espy, MD  I spent 60 total minutes on the day of the encounter including pre-visit review of the medical record, face-to-face time with the patient, and post visit ordering of labs/imaging/tests.

## 2020-07-13 ENCOUNTER — Other Ambulatory Visit: Payer: Self-pay

## 2020-07-13 ENCOUNTER — Ambulatory Visit (INDEPENDENT_AMBULATORY_CARE_PROVIDER_SITE_OTHER): Payer: Medicare HMO | Admitting: Urology

## 2020-07-13 ENCOUNTER — Other Ambulatory Visit
Admission: RE | Admit: 2020-07-13 | Discharge: 2020-07-13 | Disposition: A | Discharge status: Home / Self Care | Payer: Medicare HMO | Source: Ambulatory Visit | Attending: Urology | Admitting: Urology

## 2020-07-13 ENCOUNTER — Other Ambulatory Visit: Payer: Self-pay | Admitting: Radiology

## 2020-07-13 VITALS — BP 143/85 | HR 78 | Wt 260.0 lb

## 2020-07-13 DIAGNOSIS — Z20822 Contact with and (suspected) exposure to covid-19: Secondary | ICD-10-CM | POA: Insufficient documentation

## 2020-07-13 DIAGNOSIS — Z01812 Encounter for preprocedural laboratory examination: Secondary | ICD-10-CM | POA: Diagnosis not present

## 2020-07-13 DIAGNOSIS — N3289 Other specified disorders of bladder: Secondary | ICD-10-CM

## 2020-07-13 LAB — SARS CORONAVIRUS 2 (TAT 6-24 HRS): SARS Coronavirus 2: NEGATIVE

## 2020-07-14 ENCOUNTER — Encounter: Payer: Self-pay | Admitting: Urology

## 2020-07-14 ENCOUNTER — Other Ambulatory Visit: Payer: Self-pay

## 2020-07-14 ENCOUNTER — Ambulatory Visit: Payer: Medicare HMO

## 2020-07-14 ENCOUNTER — Ambulatory Visit: Payer: Medicare HMO | Admitting: Anesthesiology

## 2020-07-14 ENCOUNTER — Institutional Professional Consult (permissible substitution): Payer: Medicare HMO | Admitting: Radiation Oncology

## 2020-07-14 ENCOUNTER — Ambulatory Visit
Admission: RE | Admit: 2020-07-14 | Discharge: 2020-07-14 | Disposition: A | Discharge status: Home / Self Care | Payer: Medicare HMO | Attending: Urology | Admitting: Urology

## 2020-07-14 ENCOUNTER — Encounter
Admission: RE | Disposition: A | Discharge status: Home / Self Care | Payer: Self-pay | Source: Home / Self Care | Attending: Urology

## 2020-07-14 ENCOUNTER — Other Ambulatory Visit: Payer: Medicare HMO

## 2020-07-14 DIAGNOSIS — Z89612 Acquired absence of left leg above knee: Secondary | ICD-10-CM | POA: Diagnosis not present

## 2020-07-14 DIAGNOSIS — K219 Gastro-esophageal reflux disease without esophagitis: Secondary | ICD-10-CM | POA: Diagnosis not present

## 2020-07-14 DIAGNOSIS — R935 Abnormal findings on diagnostic imaging of other abdominal regions, including retroperitoneum: Secondary | ICD-10-CM | POA: Insufficient documentation

## 2020-07-14 DIAGNOSIS — F17210 Nicotine dependence, cigarettes, uncomplicated: Secondary | ICD-10-CM | POA: Diagnosis not present

## 2020-07-14 DIAGNOSIS — N3289 Other specified disorders of bladder: Secondary | ICD-10-CM

## 2020-07-14 DIAGNOSIS — I10 Essential (primary) hypertension: Secondary | ICD-10-CM | POA: Diagnosis not present

## 2020-07-14 DIAGNOSIS — Z7984 Long term (current) use of oral hypoglycemic drugs: Secondary | ICD-10-CM | POA: Diagnosis not present

## 2020-07-14 DIAGNOSIS — C3491 Malignant neoplasm of unspecified part of right bronchus or lung: Secondary | ICD-10-CM | POA: Diagnosis not present

## 2020-07-14 DIAGNOSIS — C7931 Secondary malignant neoplasm of brain: Secondary | ICD-10-CM | POA: Diagnosis not present

## 2020-07-14 DIAGNOSIS — Z79899 Other long term (current) drug therapy: Secondary | ICD-10-CM | POA: Insufficient documentation

## 2020-07-14 DIAGNOSIS — K5730 Diverticulosis of large intestine without perforation or abscess without bleeding: Secondary | ICD-10-CM | POA: Diagnosis not present

## 2020-07-14 DIAGNOSIS — Z880 Allergy status to penicillin: Secondary | ICD-10-CM | POA: Insufficient documentation

## 2020-07-14 DIAGNOSIS — E785 Hyperlipidemia, unspecified: Secondary | ICD-10-CM | POA: Diagnosis not present

## 2020-07-14 DIAGNOSIS — E119 Type 2 diabetes mellitus without complications: Secondary | ICD-10-CM | POA: Diagnosis not present

## 2020-07-14 DIAGNOSIS — I700 Atherosclerosis of aorta: Secondary | ICD-10-CM | POA: Diagnosis not present

## 2020-07-14 DIAGNOSIS — K8020 Calculus of gallbladder without cholecystitis without obstruction: Secondary | ICD-10-CM | POA: Insufficient documentation

## 2020-07-14 HISTORY — PX: CYSTOSCOPY W/ RETROGRADES: SHX1426

## 2020-07-14 LAB — MICROSCOPIC EXAMINATION
Bacteria, UA: NONE SEEN
RBC, Urine: NONE SEEN /hpf (ref 0–2)

## 2020-07-14 LAB — URINALYSIS, COMPLETE
Bilirubin, UA: NEGATIVE
Leukocytes,UA: NEGATIVE
Nitrite, UA: NEGATIVE
Protein,UA: NEGATIVE
RBC, UA: NEGATIVE
Specific Gravity, UA: 1.02 (ref 1.005–1.030)
Urobilinogen, Ur: 0.2 mg/dL (ref 0.2–1.0)
pH, UA: 6 (ref 5.0–7.5)

## 2020-07-14 LAB — URINE DRUG SCREEN, QUALITATIVE (ARMC ONLY)
Amphetamines, Ur Screen: NOT DETECTED
Barbiturates, Ur Screen: NOT DETECTED
Benzodiazepine, Ur Scrn: NOT DETECTED
Cannabinoid 50 Ng, Ur ~~LOC~~: NOT DETECTED
Cocaine Metabolite,Ur ~~LOC~~: NOT DETECTED
MDMA (Ecstasy)Ur Screen: NOT DETECTED
Methadone Scn, Ur: NOT DETECTED
Opiate, Ur Screen: NOT DETECTED
Phencyclidine (PCP) Ur S: NOT DETECTED
Tricyclic, Ur Screen: NOT DETECTED

## 2020-07-14 LAB — GLUCOSE, CAPILLARY
Glucose-Capillary: 199 mg/dL — ABNORMAL HIGH (ref 70–99)
Glucose-Capillary: 189 mg/dL — ABNORMAL HIGH (ref 70–99)

## 2020-07-14 SURGERY — CYSTOSCOPY, WITH RETROGRADE PYELOGRAM
Anesthesia: General | Site: Ureter

## 2020-07-14 MED ORDER — LACTATED RINGERS IV SOLN: INTRAVENOUS | Status: DC | PRN

## 2020-07-14 MED ORDER — MIDAZOLAM HCL 2 MG/2ML IJ SOLN
INTRAMUSCULAR | Status: DC | PRN
  Administered 2020-07-14: 2 mg via INTRAVENOUS

## 2020-07-14 MED ORDER — FENTANYL CITRATE (PF) 100 MCG/2ML IJ SOLN
INTRAMUSCULAR | Status: AC
  Filled 2020-07-14: qty 2

## 2020-07-14 MED ORDER — PROPOFOL 10 MG/ML IV BOLUS
INTRAVENOUS | Status: DC | PRN
  Administered 2020-07-14: 200 mg via INTRAVENOUS

## 2020-07-14 MED ORDER — LIDOCAINE HCL (CARDIAC) PF 100 MG/5ML IV SOSY
PREFILLED_SYRINGE | INTRAVENOUS | Status: DC | PRN
  Administered 2020-07-14: 100 mg via INTRAVENOUS

## 2020-07-14 MED ORDER — ONDANSETRON HCL 4 MG/2ML IJ SOLN
INTRAMUSCULAR | Status: AC
  Filled 2020-07-14: qty 2

## 2020-07-14 MED ORDER — FENTANYL CITRATE (PF) 100 MCG/2ML IJ SOLN: 25.0000 ug | INTRAMUSCULAR | Status: DC | PRN

## 2020-07-14 MED ORDER — ONDANSETRON HCL 4 MG/2ML IJ SOLN: 4.0000 mg | Freq: Once | INTRAMUSCULAR | Status: DC | PRN

## 2020-07-14 MED ORDER — FENTANYL CITRATE (PF) 100 MCG/2ML IJ SOLN
INTRAMUSCULAR | Status: DC | PRN
  Administered 2020-07-14: 100 ug via INTRAVENOUS

## 2020-07-14 MED ORDER — LIDOCAINE HCL (PF) 2 % IJ SOLN
INTRAMUSCULAR | Status: AC
  Filled 2020-07-14: qty 5

## 2020-07-14 MED ORDER — CHLORHEXIDINE GLUCONATE 0.12 % MT SOLN: 15.0000 mL | Freq: Once | OROMUCOSAL | Status: AC

## 2020-07-14 MED ORDER — ONDANSETRON HCL 4 MG/2ML IJ SOLN
INTRAMUSCULAR | Status: DC | PRN
  Administered 2020-07-14: 4 mg via INTRAVENOUS

## 2020-07-14 MED ORDER — CHLORHEXIDINE GLUCONATE 0.12 % MT SOLN
OROMUCOSAL | Status: AC
  Administered 2020-07-14: 15 mL via OROMUCOSAL
  Filled 2020-07-14: qty 15

## 2020-07-14 MED ORDER — IOHEXOL 180 MG/ML  SOLN
INTRAMUSCULAR | Status: DC | PRN
  Administered 2020-07-14: 20 mL

## 2020-07-14 MED ORDER — SODIUM CHLORIDE 0.9 % IV SOLN: INTRAVENOUS | Status: DC

## 2020-07-14 MED ORDER — DEXAMETHASONE SODIUM PHOSPHATE 10 MG/ML IJ SOLN
INTRAMUSCULAR | Status: AC
  Filled 2020-07-14: qty 1

## 2020-07-14 MED ORDER — MIDAZOLAM HCL 2 MG/2ML IJ SOLN
INTRAMUSCULAR | Status: AC
  Filled 2020-07-14: qty 2

## 2020-07-14 MED ORDER — CIPROFLOXACIN IN D5W 400 MG/200ML IV SOLN
INTRAVENOUS | Status: AC
  Filled 2020-07-14: qty 200

## 2020-07-14 MED ORDER — PROPOFOL 10 MG/ML IV BOLUS
INTRAVENOUS | Status: AC
  Filled 2020-07-14: qty 20

## 2020-07-14 MED ORDER — ORAL CARE MOUTH RINSE: 15.0000 mL | Freq: Once | OROMUCOSAL | Status: AC

## 2020-07-14 MED ORDER — CIPROFLOXACIN IN D5W 400 MG/200ML IV SOLN
400.0000 mg | INTRAVENOUS | Status: AC
  Administered 2020-07-14: 400 mg via INTRAVENOUS

## 2020-07-14 MED ORDER — DEXAMETHASONE SODIUM PHOSPHATE 10 MG/ML IJ SOLN
INTRAMUSCULAR | Status: DC | PRN
  Administered 2020-07-14: 10 mg via INTRAVENOUS

## 2020-07-14 SURGICAL SUPPLY — 32 items
BAG DRAIN CYSTO-URO LG1000N (MISCELLANEOUS) ×4 IMPLANT
BAG URINE DRAIN 2000ML AR STRL (UROLOGICAL SUPPLIES) ×4 IMPLANT
BRUSH SCRUB EZ  4% CHG (MISCELLANEOUS) ×2
BRUSH SCRUB EZ 1% IODOPHOR (MISCELLANEOUS) ×4 IMPLANT
BRUSH SCRUB EZ 4% CHG (MISCELLANEOUS) ×2 IMPLANT
CATH FOLEY 2WAY  5CC 16FR (CATHETERS) ×2
CATH URETL 5X70 OPEN END (CATHETERS) ×4 IMPLANT
CATH URTH 16FR FL 2W BLN LF (CATHETERS) ×2 IMPLANT
DRAPE UTILITY 15X26 TOWEL STRL (DRAPES) ×4 IMPLANT
DRSG TELFA 4X3 1S NADH ST (GAUZE/BANDAGES/DRESSINGS) ×4 IMPLANT
ELECT LOOP 22F BIPOLAR SML (ELECTROSURGICAL)
ELECT REM PT RETURN 9FT ADLT (ELECTROSURGICAL)
ELECTRODE LOOP 22F BIPOLAR SML (ELECTROSURGICAL) IMPLANT
ELECTRODE REM PT RTRN 9FT ADLT (ELECTROSURGICAL) IMPLANT
GLOVE BIO SURGEON STRL SZ 6.5 (GLOVE) ×3 IMPLANT
GLOVE BIO SURGEONS STRL SZ 6.5 (GLOVE) ×1
GOWN STRL REUS W/ TWL LRG LVL3 (GOWN DISPOSABLE) ×4 IMPLANT
GOWN STRL REUS W/TWL LRG LVL3 (GOWN DISPOSABLE) ×4
GUIDEWIRE STR DUAL SENSOR (WIRE) ×4 IMPLANT
KIT TURNOVER CYSTO (KITS) ×4 IMPLANT
LOOP CUT BIPOLAR 24F LRG (ELECTROSURGICAL) IMPLANT
NDL SAFETY ECLIPSE 18X1.5 (NEEDLE) ×2 IMPLANT
NEEDLE HYPO 18GX1.5 SHARP (NEEDLE) ×2
PACK CYSTO AR (MISCELLANEOUS) ×4 IMPLANT
PAD ARMBOARD 7.5X6 YLW CONV (MISCELLANEOUS) ×4 IMPLANT
SET CYSTO W/LG BORE CLAMP LF (SET/KITS/TRAYS/PACK) ×4 IMPLANT
SET IRRIG Y TYPE TUR BLADDER L (SET/KITS/TRAYS/PACK) ×4 IMPLANT
SOL .9 NS 3000ML IRR  AL (IV SOLUTION) ×2
SOL .9 NS 3000ML IRR UROMATIC (IV SOLUTION) ×2 IMPLANT
SURGILUBE 2OZ TUBE FLIPTOP (MISCELLANEOUS) ×4 IMPLANT
SYRINGE IRR TOOMEY STRL 70CC (SYRINGE) ×4 IMPLANT
WATER STERILE IRR 1000ML POUR (IV SOLUTION) ×4 IMPLANT

## 2020-07-14 NOTE — Transfer of Care (Signed)
Immediate Anesthesia Transfer of Care Note  Patient: Tyler Gallagher  Procedure(s) Performed: CYSTOSCOPY WITH RETROGRADE PYELOGRAM (Bilateral Ureter)  Patient Location: PACU  Anesthesia Type:General  Level of Consciousness: awake, alert  and oriented  Airway & Oxygen Therapy: Patient Spontanous Breathing and Patient connected to nasal cannula oxygen  Post-op Assessment: Report given to RN and Post -op Vital signs reviewed and stable  Post vital signs: Reviewed and stable  Last Vitals:  Vitals Value Taken Time  BP 132/91 07/14/20 1135  Temp 36.1 C 07/14/20 1135  Pulse 70 07/14/20 1138  Resp 15 07/14/20 1138  SpO2 98 % 07/14/20 1138  Vitals shown include unvalidated device data.  Last Pain:  Vitals:   07/14/20 0904  TempSrc: Temporal  PainSc: 3          Complications: No complications documented.

## 2020-07-14 NOTE — Interval H&P Note (Signed)
History and Physical Interval Note:  07/14/2020 10:05 AM  Tyler Gallagher  has presented today for surgery, with the diagnosis of bladder mass.  The various methods of treatment have been discussed with the patient and family. After consideration of risks, benefits and other options for treatment, the patient has consented to  Procedure(s): TRANSURETHRAL RESECTION OF BLADDER TUMOR (TURBT) (N/A) CYSTOSCOPY WITH RETROGRADE PYELOGRAM (Bilateral) as a surgical intervention.  The patient's history has been reviewed, patient examined, no change in status, stable for surgery.  I have reviewed the patient's chart and labs.  Questions were answered to the patient's satisfaction.    RRR CTAB   Hollice Espy

## 2020-07-14 NOTE — Discharge Instructions (Signed)
There was no tumor in your bladder today! It was completely normal.  This is wonderful news.  I will let [name] know.  Nothing more to do from a urological perspective.

## 2020-07-14 NOTE — Anesthesia Postprocedure Evaluation (Signed)
Anesthesia Post Note  Patient: Tyler Gallagher  Procedure(s) Performed: CYSTOSCOPY WITH RETROGRADE PYELOGRAM (Bilateral Ureter)  Patient location during evaluation: PACU Anesthesia Type: General Level of consciousness: awake and alert Pain management: pain level controlled Vital Signs Assessment: post-procedure vital signs reviewed and stable Respiratory status: spontaneous breathing and respiratory function stable Cardiovascular status: blood pressure returned to baseline and stable Anesthetic complications: no   No complications documented.   Last Vitals:  Vitals:   07/14/20 1205 07/14/20 1217  BP: 111/83   Pulse: 68 70  Resp: 15 (!) 22  Temp: 36.5 C   SpO2: 96% 97%    Last Pain:  Vitals:   07/14/20 1205  TempSrc:   PainSc: 0-No pain                 Cullan Launer K

## 2020-07-14 NOTE — Anesthesia Preprocedure Evaluation (Signed)
Anesthesia Evaluation  Patient identified by MRN, date of birth, ID band Patient awake    Reviewed: Allergy & Precautions, NPO status , Patient's Chart, lab work & pertinent test results  History of Anesthesia Complications Negative for: history of anesthetic complications  Airway Mallampati: II       Dental   Pulmonary neg sleep apnea, neg COPD, Current Smoker and Patient abstained from smoking.,           Cardiovascular hypertension, Pt. on medications (-) Past MI and (-) CHF (-) dysrhythmias (-) Valvular Problems/Murmurs     Neuro/Psych neg Seizures TIA   GI/Hepatic Neg liver ROS, GERD  Medicated and Controlled,  Endo/Other  diabetes, Type 2, Oral Hypoglycemic Agents  Renal/GU negative Renal ROS     Musculoskeletal   Abdominal   Peds  Hematology   Anesthesia Other Findings   Reproductive/Obstetrics                             Anesthesia Physical Anesthesia Plan  ASA: III  Anesthesia Plan: General   Post-op Pain Management:    Induction: Intravenous  PONV Risk Score and Plan:   Airway Management Planned: LMA  Additional Equipment:   Intra-op Plan:   Post-operative Plan:   Informed Consent: I have reviewed the patients History and Physical, chart, labs and discussed the procedure including the risks, benefits and alternatives for the proposed anesthesia with the patient or authorized representative who has indicated his/her understanding and acceptance.       Plan Discussed with:   Anesthesia Plan Comments:         Anesthesia Quick Evaluation

## 2020-07-14 NOTE — Op Note (Signed)
Date of procedure: 07/14/20  Preoperative diagnosis:  1. Possible bladder mass  Postoperative diagnosis:  1. Normal cystoscopy  Procedure: 1. Cystoscopy 2. Bilateral retrograde pyelogram  Surgeon: Hollice Espy, MD  Anesthesia: General  Complications: None  Intraoperative findings: Moderately to heavily trabeculated bladder otherwise no masses tumors or lesions.  Bilateral retrogrades unremarkable.  EBL: Minimal  Specimens: None  Drains: None  Indication: Tyler Gallagher is a 59 y.o. 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  After reviewing the management options for treatment, he elected to proceed with the above surgical procedure(s). We have discussed the potential benefits and risks of the procedure, side effects of the proposed treatment, the likelihood of the patient achieving the goals of the procedure, and any potential problems that might occur during the procedure or recuperation. Informed consent has been obtained.  Description of procedure:  The patient was taken to the operating room and general anesthesia was induced.  The patient was placed in the dorsal lithotomy position, prepped and draped in the usual sterile fashion, and preoperative antibiotics were administered. A preoperative time-out was performed.   A 21 French scope was advanced per urethra into the bladder.  The bladder is carefully inspected and was noted to be free of any tumors, masses, or lesions.  Is moderately to highly trabeculated but otherwise there was no tumors identified.  Attention was first turned to the right ureteral orifice which was cannulated using a 5 [place] open-ended ureteral catheter.  A gentle retrograde pyelogram was performed on the side which revealed no hydroureteronephrosis or filling defects.  Attention was then turned to  the left ureteral orifice and the same exact procedure was performed.  This retrograde pyelogram was also normal without hydronephrosis or filling defects.  Finally, the bladder was inspected one additional time to ensure that nothing had been missed.  There was no evidence of tumors whatsoever.  The patient's bladder was then drained and the scope was removed.  He was cleaned and dried, repositioned in supine position, reversed from anesthesia, and taken to the PACU in stable condition.  Plan: Findings were discussed with his oncologist [name].  No additional work-up from GU perspective.  Hollice Espy, M.D.

## 2020-07-15 ENCOUNTER — Encounter: Payer: Self-pay | Admitting: Urology

## 2020-07-15 DIAGNOSIS — C3491 Malignant neoplasm of unspecified part of right bronchus or lung: Secondary | ICD-10-CM | POA: Diagnosis not present

## 2020-07-15 DIAGNOSIS — J449 Chronic obstructive pulmonary disease, unspecified: Secondary | ICD-10-CM | POA: Diagnosis not present

## 2020-07-15 DIAGNOSIS — G939 Disorder of brain, unspecified: Secondary | ICD-10-CM | POA: Diagnosis not present

## 2020-07-15 DIAGNOSIS — C3400 Malignant neoplasm of unspecified main bronchus: Secondary | ICD-10-CM | POA: Diagnosis not present

## 2020-07-15 DIAGNOSIS — R32 Unspecified urinary incontinence: Secondary | ICD-10-CM | POA: Diagnosis not present

## 2020-07-15 DIAGNOSIS — E0865 Diabetes mellitus due to underlying condition with hyperglycemia: Secondary | ICD-10-CM | POA: Diagnosis not present

## 2020-07-15 DIAGNOSIS — Z01818 Encounter for other preprocedural examination: Secondary | ICD-10-CM | POA: Diagnosis not present

## 2020-07-15 DIAGNOSIS — C3490 Malignant neoplasm of unspecified part of unspecified bronchus or lung: Secondary | ICD-10-CM | POA: Diagnosis not present

## 2020-07-15 DIAGNOSIS — E119 Type 2 diabetes mellitus without complications: Secondary | ICD-10-CM | POA: Diagnosis not present

## 2020-07-15 DIAGNOSIS — C7931 Secondary malignant neoplasm of brain: Secondary | ICD-10-CM | POA: Diagnosis not present

## 2020-07-15 DIAGNOSIS — D496 Neoplasm of unspecified behavior of brain: Secondary | ICD-10-CM | POA: Diagnosis not present

## 2020-07-15 DIAGNOSIS — G453 Amaurosis fugax: Secondary | ICD-10-CM | POA: Diagnosis not present

## 2020-07-15 DIAGNOSIS — E109 Type 1 diabetes mellitus without complications: Secondary | ICD-10-CM | POA: Diagnosis not present

## 2020-07-15 DIAGNOSIS — E1165 Type 2 diabetes mellitus with hyperglycemia: Secondary | ICD-10-CM | POA: Diagnosis not present

## 2020-07-15 DIAGNOSIS — E785 Hyperlipidemia, unspecified: Secondary | ICD-10-CM | POA: Diagnosis not present

## 2020-07-15 DIAGNOSIS — I10 Essential (primary) hypertension: Secondary | ICD-10-CM | POA: Diagnosis not present

## 2020-07-15 DIAGNOSIS — G936 Cerebral edema: Secondary | ICD-10-CM | POA: Diagnosis not present

## 2020-07-15 DIAGNOSIS — E538 Deficiency of other specified B group vitamins: Secondary | ICD-10-CM | POA: Diagnosis not present

## 2020-07-15 NOTE — Progress Notes (Addendum)
Tumor Board Documentation  Tyler Gallagher was presented by Dr Janese Banks at our Tumor Board on 07/14/2020, which included representatives from medical oncology, radiation oncology, surgical oncology, internal medicine, navigation, pathology, radiology, surgical, genetics, research, palliative care, pulmonology.  [name] currently presents as a new patient, for Tyler Gallagher, for new positive pathology with history of the following treatments: active survellience, surgical intervention(s).  Additionally, we reviewed previous medical and familial history, history of present illness, and recent lab results along with all available histopathologic and imaging studies. The tumor board considered available treatment options and made the following recommendations: Radiation therapy (primary modality), Surgery (Resection of Brain Mass)  With consideration for concurrent chemo/Rt based on performance status post surgery  The following procedures/referrals were also placed: No orders of the defined types were placed in this encounter.   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  Tumor board is a meeting of clinicians from various specialty areas who evaluate and discuss patients for whom a multidisciplinary approach is being considered. Final determinations in the plan of care are those of the provider(s). The responsibility for follow up of recommendations given during tumor board is that of the provider.   Today's extended care, comprehensive team conference, Tyler Gallagher was not present for the discussion and was not examined.   Multidisciplinary Tumor Board is a multidisciplinary case peer review process.  Decisions discussed in the Multidisciplinary Tumor Board reflect the opinions of the specialists  present at the conference without having examined the patient.  Ultimately, treatment and diagnostic decisions rest with the primary provider(s) and the patient.

## 2020-07-17 LAB — CULTURE, URINE COMPREHENSIVE

## 2020-07-24 DIAGNOSIS — C7932 Secondary malignant neoplasm of cerebral meninges: Secondary | ICD-10-CM | POA: Diagnosis not present

## 2020-07-24 DIAGNOSIS — Z794 Long term (current) use of insulin: Secondary | ICD-10-CM | POA: Diagnosis not present

## 2020-07-24 DIAGNOSIS — E119 Type 2 diabetes mellitus without complications: Secondary | ICD-10-CM | POA: Diagnosis not present

## 2020-07-24 DIAGNOSIS — C3491 Malignant neoplasm of unspecified part of right bronchus or lung: Secondary | ICD-10-CM | POA: Diagnosis not present

## 2020-07-24 DIAGNOSIS — J449 Chronic obstructive pulmonary disease, unspecified: Secondary | ICD-10-CM | POA: Diagnosis not present

## 2020-07-24 DIAGNOSIS — K219 Gastro-esophageal reflux disease without esophagitis: Secondary | ICD-10-CM | POA: Diagnosis not present

## 2020-07-24 DIAGNOSIS — E785 Hyperlipidemia, unspecified: Secondary | ICD-10-CM | POA: Diagnosis not present

## 2020-07-24 DIAGNOSIS — I10 Essential (primary) hypertension: Secondary | ICD-10-CM | POA: Diagnosis not present

## 2020-07-24 DIAGNOSIS — E538 Deficiency of other specified B group vitamins: Secondary | ICD-10-CM | POA: Diagnosis not present

## 2020-07-26 ENCOUNTER — Other Ambulatory Visit: Payer: Self-pay | Admitting: *Deleted

## 2020-07-26 ENCOUNTER — Other Ambulatory Visit: Payer: Self-pay

## 2020-07-26 ENCOUNTER — Inpatient Hospital Stay (HOSPITAL_BASED_OUTPATIENT_CLINIC_OR_DEPARTMENT_OTHER): Payer: Medicare HMO | Admitting: Oncology

## 2020-07-26 ENCOUNTER — Ambulatory Visit
Admission: RE | Admit: 2020-07-26 | Discharge: 2020-07-26 | Disposition: A | Discharge status: Home / Self Care | Payer: Medicare HMO | Source: Ambulatory Visit | Attending: Radiation Oncology | Admitting: Radiation Oncology

## 2020-07-26 ENCOUNTER — Encounter: Payer: Self-pay | Admitting: Radiation Oncology

## 2020-07-26 ENCOUNTER — Encounter: Payer: Self-pay | Admitting: *Deleted

## 2020-07-26 VITALS — BP 138/93 | HR 85 | Temp 97.7°F | Wt 230.0 lb

## 2020-07-26 VITALS — BP 131/89 | HR 87 | Temp 97.1°F | Wt 230.0 lb

## 2020-07-26 DIAGNOSIS — Z7189 Other specified counseling: Secondary | ICD-10-CM | POA: Diagnosis not present

## 2020-07-26 DIAGNOSIS — Z9221 Personal history of antineoplastic chemotherapy: Secondary | ICD-10-CM | POA: Diagnosis not present

## 2020-07-26 DIAGNOSIS — N400 Enlarged prostate without lower urinary tract symptoms: Secondary | ICD-10-CM | POA: Diagnosis not present

## 2020-07-26 DIAGNOSIS — C7931 Secondary malignant neoplasm of brain: Secondary | ICD-10-CM

## 2020-07-26 DIAGNOSIS — R531 Weakness: Secondary | ICD-10-CM | POA: Diagnosis not present

## 2020-07-26 DIAGNOSIS — C3431 Malignant neoplasm of lower lobe, right bronchus or lung: Secondary | ICD-10-CM

## 2020-07-26 DIAGNOSIS — J439 Emphysema, unspecified: Secondary | ICD-10-CM | POA: Diagnosis not present

## 2020-07-26 DIAGNOSIS — F17210 Nicotine dependence, cigarettes, uncomplicated: Secondary | ICD-10-CM | POA: Diagnosis not present

## 2020-07-26 DIAGNOSIS — I10 Essential (primary) hypertension: Secondary | ICD-10-CM | POA: Diagnosis not present

## 2020-07-26 DIAGNOSIS — E119 Type 2 diabetes mellitus without complications: Secondary | ICD-10-CM | POA: Diagnosis not present

## 2020-07-26 DIAGNOSIS — C3491 Malignant neoplasm of unspecified part of right bronchus or lung: Secondary | ICD-10-CM | POA: Insufficient documentation

## 2020-07-26 DIAGNOSIS — N329 Bladder disorder, unspecified: Secondary | ICD-10-CM | POA: Diagnosis not present

## 2020-07-26 DIAGNOSIS — E785 Hyperlipidemia, unspecified: Secondary | ICD-10-CM | POA: Diagnosis not present

## 2020-07-26 MED ORDER — OXYCODONE HCL 5 MG PO TABS: 5.0000 mg | ORAL_TABLET | Freq: Four times a day (QID) | ORAL | 0 refills | Status: DC | PRN

## 2020-07-26 MED ORDER — DEXAMETHASONE 4 MG PO TABS
4.0000 mg | ORAL_TABLET | Freq: Two times a day (BID) | ORAL | 0 refills | Status: DC
Start: 2020-07-26 — End: 2020-08-15

## 2020-07-26 MED ORDER — PROCHLORPERAZINE MALEATE 10 MG PO TABS: 10.0000 mg | ORAL_TABLET | Freq: Four times a day (QID) | ORAL | 1 refills | Status: DC | PRN

## 2020-07-26 MED ORDER — LIDOCAINE-PRILOCAINE 2.5-2.5 % EX CREA: TOPICAL_CREAM | CUTANEOUS | 3 refills | Status: DC

## 2020-07-26 MED ORDER — ONDANSETRON HCL 8 MG PO TABS: 8.0000 mg | ORAL_TABLET | Freq: Two times a day (BID) | ORAL | 1 refills | Status: DC | PRN

## 2020-07-26 MED ORDER — DEXAMETHASONE 4 MG PO TABS: 8.0000 mg | ORAL_TABLET | Freq: Every day | ORAL | 1 refills | Status: DC

## 2020-07-26 NOTE — Progress Notes (Signed)
Radiation Oncology Follow up Note  Name: Tyler Gallagher   Date:   07/26/2020 MRN:  [id number] DOB: 1961/07/09    This 59 y.o. male presents to the clinic today for follow-up status post craniotomy and resection of a solitary brain metastasis and patient with known stage IV adenocarcinoma of the right lung.Marland Kitchen  REFERRING PROVIDER: Juluis Pitch, MD  HPI: Patient is a 59 year old male initially consulted back in July when he presented with evidence of a solitary brain metastasis on brain MRI as well as a right lung nodule.  Measuring 3.8 x 3.8 cm abutting the right paravertebral pleural margin.  HEENT developed increasing right-sided weakness MRI of the brain showed tall solitary metastatic deposit in the left medial frontal lobe with surrounding edema.  He was seen at by neurology neurosurgery at [facility] underwent craniotomy with partial resection of the solitary brain metastasis. A corticectomy was made anterior to the lesion working between the bridging vein and the motor cortex, and a plane was developed between normal tissue and the tumor. Using suction and electrocautery, the anterior and lateral/medial borders of the tumor were dissected. The tumor was seen to be firm but there was also central cystic portion which was decompressed. Before proceeding posteriorly, subcortical stimulation was performed and motor fibers were seen to be within 2 mA stimulation representing close proximity. Given this, some additional tumor was debulked but care was taken not to violate the posterior portion. As this was being done, the direct stimulation of the leg was seen to decrease. The subcortical stimulation remained stable. Given the risks to damage of the motor fibers with continued resection, the resection was stopped at this time. Pathology report is not been reviewed turned at this point based on the operative report and according to the patient he was told there is still gross tumor present.  Neurologically  he is somewhat emotionally labile although otherwise doing well he states he still is sensing some right-sided weakness and has some abnormal bladder function.  Patient did undergo TUR BP yesterday by Dr. Erlene Quan not sure the results of the procedure at this time.  His motor function seems to be stable at this time.   COMPLICATIONS OF TREATMENT: none  FOLLOW UP COMPLIANCE: keeps appointments   PHYSICAL EXAM:  BP (!) 138/93 (BP Location: Left Arm, Patient Position: Sitting, Cuff Size: Large)   Pulse 85   Temp 97.7 F (36.5 C) (Tympanic)   Wt (!) 230 lb (104.3 kg)   BMI 28.75 kg/m  Patient has a midline craniotomy incision which appears to be healing well.  Motor or sensory and DTR levels appear equal and symmetric.  Crude visual fields within normal range.  Patient has of above-knee amputation.  Well-developed well-nourished patient in NAD. HEENT reveals PERLA, EOMI, discs not visualized.  Oral cavity is clear. No oral mucosal lesions are identified. Neck is clear without evidence of cervical or supraclavicular adenopathy. Lungs are clear to A&P. Cardiac examination is essentially unremarkable with regular rate and rhythm without murmur rub or thrill. Abdomen is benign with no organomegaly or masses noted. Motor sensory and DTR levels are equal and symmetric in the upper and lower extremities. Cranial nerves II through XII are grossly intact. Proprioception is intact. No peripheral adenopathy or edema is identified. No motor or sensory levels are noted. Crude visual fields are within normal range.  RADIOLOGY RESULTS: MRI scan for SRS has been ordered  PLAN: At this time we will go ahead with single fraction SRS to his  brain lesion would plan on treating 20 Gy in 1 fraction.  Risks and benefits of SRS including brain necrosis fatigue hair loss alteration of blood counts all were discussed in detail.  I have personally set up and ordered CT simulation for SRS.  Also discussed the case personally  with [name].  We will plan on delivering 6600 cGy in 25 fractions with concurrent chemotherapy to his lung mass.  Risks and benefits of lung radiation including cough skin reaction 6 slight chance of radiation esophagitis fatigue all again were discussed in detail.  We will plan on simulation after completion of his SRS and coordinate that to begin in the middle of August with medical oncology.  Patient comprehends my recommendations well.  I would like to take this opportunity to thank you for allowing me to participate in the care of your patient.Noreene Filbert, MD

## 2020-07-26 NOTE — Progress Notes (Signed)
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 CTA showed no pulmonary embolism but a 3.8 x 3.8 cm medial right lower lobe mass abutting the right paravertebral pleural margin. No involvement of adjacent vertebral bodies and consistent with primary lung malignancy. He then presented to ER with symptoms of increasing weakness over the right side. He underwent MRI brain with and without contrast at [facility] which showed a solitary metastatic deposit in the left medial frontal lobe with surrounding vasogenic edema. Also had a CT abdomen which showed large area of heterogeneous attenuation within the lumen of the urinary bladder. While this may represent a large amount of blood products the presence of urinary bladder mass not excluded.  Patient taken for cystoscopy and TURBT which did not show any bladder mass   PET CT scan showed 3.7 cm right lower lobe mass with an SUV of 30.7.  The mass abuts the visceral pleura without definite pleural invasion.  No hypermetabolic adenopathy or evidence of distant metastatic disease.  Patient underwent bronchoscopy with [name] which showed non-small cell lung cancer favor adenocarcinoma  Interval history-patient underwent frontal lobe tumor resection on 07/18/2020 with Dr. [name]  at [facility].  He reports having pain at the incision sites and sometimes swelling in that area.  He will be seeing Dr. [name] again on 08/03/2020.  He is currently down to Decadron 2 mg twice daily but states that every time he tries to wean himself off steroids his bladder incontinence and right lower extremity weakness comes back.  He reports having considerable pain at the site of the incision  ECOG PS- 2 Pain scale- 4 Opioid associated constipation- no  Review of systems- Review of Systems  Constitutional: Positive for malaise/fatigue. Negative for chills, fever and weight loss.  HENT: Negative for congestion, ear discharge and nosebleeds.        Pain over scalp incision  Eyes: Negative for blurred vision.  Respiratory: Negative for cough, hemoptysis, sputum production, shortness of breath and wheezing.   Cardiovascular: Negative for chest pain, palpitations, orthopnea and claudication.  Gastrointestinal: Negative for abdominal pain, blood in stool, constipation, diarrhea, heartburn, melena, nausea and vomiting.  Genitourinary: Negative for dysuria, flank pain, frequency, hematuria and urgency.  Musculoskeletal: Negative for back pain, joint pain and myalgias.  Skin: Negative for rash.  Neurological: Negative for dizziness, tingling, focal weakness, seizures, weakness and headaches.  Endo/Heme/Allergies: Does not bruise/bleed easily.  Psychiatric/Behavioral: Negative for depression and suicidal ideas. The patient does not have insomnia.        Allergies  Allergen Reactions  . Penicillins Other (See Comments)    unknown     Past Medical History:  Diagnosis Date  . Diabetes mellitus without complication ([place])   . Hyperlipidemia   . Hypertension   . Lesion of brain   . Mass of lung      Past Surgical  History:  Procedure Laterality Date  . 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 COLONOSCOPY    . CYSTOSCOPY W/ RETROGRADES Bilateral 07/14/2020   Procedure: CYSTOSCOPY WITH RETROGRADE  PYELOGRAM;  Surgeon: Hollice Espy, MD;  Location: [facility] ORS;  Service: Urology;  Laterality: Bilateral;  . VIDEO BRONCHOSCOPY WITH ENDOBRONCHIAL NAVIGATION N/A 07/01/2020   Procedure: VIDEO BRONCHOSCOPY WITH ENDOBRONCHIAL NAVIGATION;  Surgeon: [name], MD;  Location: [facility] ORS;  Service: Thoracic;  Laterality: N/A;  . VIDEO BRONCHOSCOPY WITH ENDOBRONCHIAL ULTRASOUND N/A 07/01/2020   Procedure: VIDEO BRONCHOSCOPY WITH ENDOBRONCHIAL ULTRASOUND;  Surgeon: [name], MD;  Location: [facility] ORS;  Service: Thoracic;  Laterality: N/A;    Social History   Socioeconomic History  . Marital status: Divorced    Spouse name: Not on file  . Number of children: Not on file  . Years of education: Not on file  . Highest education level: Not on file  Occupational History  . Not on file  Tobacco Use  . Smoking status: Current Every Day Smoker    Packs/day: 2.00    Years: 32.00    Pack years: 64.00    Types: Cigarettes  . Smokeless tobacco: Never Used  . Tobacco comment: trying to stop - not smoked in two weeks  Vaping Use  . Vaping Use: Never used  Substance and Sexual Activity  . Alcohol use: No    Alcohol/week: 0.0 standard drinks  . Drug use: Yes    Types: Marijuana    Comment: two weeks  . Sexual activity: Not on file  Other Topics Concern  . Not on file  Social History Narrative  . Not on file   Social Determinants of Health   Financial Resource Strain:   . Difficulty of Paying Living Expenses:   Food Insecurity:   . Worried About Charity fundraiser in the Last Year:   . Arboriculturist in the Last Year:   Transportation Needs:   . Film/video editor (Medical):   Marland Kitchen Lack of Transportation (Non-Medical):   Physical Activity:   . Days of Exercise per Week:   . Minutes of Exercise per Session:   Stress:   . Feeling of Stress :   Social Connections:   . Frequency of Communication with Friends and Family:   . Frequency of Social Gatherings with Friends and Family:     . Attends Religious Services:   . Active Member of Clubs or Organizations:   . Attends Archivist Meetings:   Marland Kitchen Marital Status:   Intimate Partner Violence:   . Fear of Current or Ex-Partner:   . Emotionally Abused:   Marland Kitchen Physically Abused:   . Sexually Abused:     Family History  Problem Relation Age of Onset  . Diabetes Sister   . Diabetes Brother      Current Outpatient Medications:  .  atorvastatin (LIPITOR) 10 MG tablet, Take 10 mg by mouth every morning. , Disp: , Rfl:  .  Cyanocobalamin (RA VITAMIN B-12 TR) 1000 MCG TBCR, Take 1,000 mcg by mouth in the morning and at bedtime. , Disp: , Rfl:  .  dexamethasone (DECADRON) 4 MG tablet, Take 2 tablets (8 mg total) by mouth 2 (two) times daily. (Patient taking differently: Take 4 mg by mouth 2 (two) times daily. ), Disp: 60 tablet, Rfl: 1 .  dexamethasone (DECADRON) 4 MG tablet, Take 1 tablet (4 mg total) by mouth 2 (two) times daily with a meal., Disp: 60 tablet, Rfl:  0 .  gabapentin (NEURONTIN) 300 MG capsule, Take 900 mg by mouth 3 (three) times daily., Disp: , Rfl:  .  gemfibrozil (LOPID) 600 MG tablet, Take 300 mg by mouth 2 (two) times daily. , Disp: , Rfl:  .  lisinopril-hydrochlorothiazide (ZESTORETIC) 10-12.5 MG tablet, Take 1 tablet by mouth daily., Disp: , Rfl:  .  LORazepam (ATIVAN) 0.5 MG tablet, Take 1 tablet (0.5 mg total) by mouth every 8 (eight) hours as needed for anxiety., Disp: 30 tablet, Rfl: 0 .  metFORMIN (GLUCOPHAGE-XR) 500 MG 24 hr tablet, Take 500 mg by mouth in the morning, at noon, in the evening, and at bedtime. , Disp: , Rfl:  .  pantoprazole (PROTONIX) 20 MG tablet, Take 1 tablet (20 mg total) by mouth daily., Disp: 30 tablet, Rfl: 1 .  oxyCODONE (OXY IR/ROXICODONE) 5 MG immediate release tablet, Take 1 tablet (5 mg total) by mouth every 6 (six) hours as needed for severe pain., Disp: 30 tablet, Rfl: 0  Physical exam:  Vitals:   07/26/20 1121  BP: (!) 131/89  Pulse: 87  Temp: (!) 97.1 F  (36.2 C)  TempSrc: Tympanic  SpO2: 96%  Weight: (!) 230 lb (104.3 kg)   Physical Exam Constitutional:      Comments: He is sitting in a wheelchair.  Appears tearful  HENT:     Head:     Comments: Scalp incision from recent frontal lobe tumor resection appears well opposed.  There is no evidence of infection or wound dehiscence. Pulmonary:     Effort: Pulmonary effort is normal.  Musculoskeletal:     Comments: Patient is s/p left AKA  Skin:    General: Skin is warm and dry.  Neurological:     Mental Status: He is alert and oriented to person, place, and time.      CMP Latest Ref Rng & Units 06/21/2020  Glucose 70 - 99 mg/dL 207(H)  BUN 6 - 20 mg/dL 9  Creatinine 0.61 - 1.24 mg/dL 0.71  Sodium 135 - 145 mmol/L 136  Potassium 3.5 - 5.1 mmol/L 3.5  Chloride 98 - 111 mmol/L 97(L)  CO2 22 - 32 mmol/L 27  Calcium 8.9 - 10.3 mg/dL 9.7  Total Protein 6.5 - 8.1 g/dL 7.7  Total Bilirubin 0.3 - 1.2 mg/dL 0.7  Alkaline Phos 38 - 126 U/L 90  AST 15 - 41 U/L 30  ALT 0 - 44 U/L 42   CBC Latest Ref Rng & Units 06/21/2020  WBC 4.0 - 10.5 K/uL 7.6  Hemoglobin 13.0 - 17.0 g/dL 15.9  Hematocrit 39 - 52 % 45.4  Platelets 150 - 400 K/uL 229    No images are attached to the encounter.  NM PET Image Initial (PI) Skull Base To Thigh  Result Date: 06/29/2020 CLINICAL DATA:  Initial treatment strategy for lung mass with intracranial metastatic disease. EXAM: NUCLEAR MEDICINE PET SKULL BASE TO THIGH TECHNIQUE: 14.1 mCi F-18 FDG was injected intravenously. Full-ring PET imaging was performed from the skull base to thigh after the radiotracer. CT data was obtained and used for attenuation correction and anatomic localization. Fasting blood glucose: 164 mg/dl COMPARISON:  CT abdomen 06/21/2020 and CT chest 06/17/2020 FINDINGS: Mediastinal blood pool activity: SUV max 2.9 Liver activity: SUV max NA NECK: No significant abnormal hypermetabolic activity in this region. The level of the known left frontal  lobe vertex metastatic lesion is not included on today's PET-CT. Incidental CT findings: Chronic right maxillary sinusitis. Bilateral common carotid atherosclerotic calcification. CHEST:  The medial right lower lobe mass measures 3.7 by 3.4 cm on image 138/3, has maximum SUV of 13.7, compatible with malignancy. This lesion abuts the visceral pleura without definite pleural invasion. No hypermetabolic or pathologically enlarged adenopathy in the chest. Incidental CT findings: Coronary, aortic arch, and branch vessel atherosclerotic vascular disease. Airway thickening is present, suggesting bronchitis or reactive airways disease. Centrilobular emphysema. ABDOMEN/PELVIS: Focal anal activity is most likely physiologic, maximum SUV 9.9. Scattered likely physiologic bowel activity. No adrenal mass. No compelling findings of metastatic disease to the abdomen/pelvis. Incidental CT findings: Dependent density in the gallbladder favoring gallstones. Aortoiliac atherosclerotic vascular disease. Subcutaneous calcifications along the right groin region. SKELETON: No significant abnormal hypermetabolic activity in this region. Incidental CT findings: Bony demineralization in the left proximal femur extending down towards the above the knee amputation, likely from disuse and reduced weight-bearing. 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 There is degenerative disc disease at L5-S1. IMPRESSION: 1. 3.7 cm right lower lobe mass is highly hypermetabolic with maximum SUV 30.7, compatible with malignancy. 2. No hypermetabolic adenopathy or evidence of metastatic disease to the abdomen/pelvis or skeleton. The patient has a known likely metastatic lesion to the left frontal vertex of the brain, a level not included on today's PET-CT. 3. Other imaging findings of potential clinical significance: Chronic right maxillary sinusitis. Aortic Atherosclerosis (ICD10-I70.0) and Emphysema (ICD10-J43.9).  Coronary atherosclerosis. Airway thickening is present, suggesting bronchitis or reactive airways disease. Cholelithiasis. 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 Electronically Signed   By: Van Clines M.D.   On: 06/29/2020 15:32   DG C-Arm 1-60 Min-No Report  Result Date: 07/01/2020 Fluoroscopy was utilized by the requesting physician.  No radiographic interpretation.   DG OR UROLOGY CYSTO IMAGE ([facility] ONLY)  Result Date: 07/14/2020 There is no interpretation for this exam.  This order is for images obtained during a surgical procedure.  Please See "Surgeries" Tab for more information regarding the procedure.   CT Super D Chest Wo Contrast  Result Date: 07/01/2020 CLINICAL DATA:  Pulmonary nodule.  Pre-procedure planning study. EXAM: CT CHEST WITHOUT CONTRAST TECHNIQUE: Multidetector CT imaging of the chest was performed using thin slice collimation for electromagnetic bronchoscopy planning purposes, without intravenous contrast. COMPARISON:  PET-CT 06/29/2020 FINDINGS: Cardiovascular: The heart size is normal. No substantial pericardial effusion. Coronary artery calcification is evident. Atherosclerotic calcification is noted in the wall of the thoracic aorta. Ascending thoracic aorta measures 4.4 cm diameter. Mediastinum/Nodes: No mediastinal lymphadenopathy. No evidence for gross hilar lymphadenopathy although assessment is limited by the lack of intravenous contrast on today's study. The esophagus has normal imaging features. No evidence for gross hilar lymphadenopathy although assessment is limited by the lack of intravenous contrast on today's study. Lungs/Pleura: Centrilobular and paraseptal emphysema evident. 3.7 cm medial right lower lobe pulmonary mass lesion again noted. No focal airspace consolidation. No pleural effusion. Upper Abdomen: The liver shows diffusely decreased attenuation suggesting fat deposition.  Layering tiny calcified gallstones evident. Exophytic lesion upper interpolar left kidney is stable and not hypermetabolic on previous PET-CT. Musculoskeletal: No worrisome lytic or sclerotic osseous abnormality. IMPRESSION: 1. 3.7 cm medial right lower lobe pulmonary mass. 2. No evidence for gross hilar lymphadenopathy although assessment is limited by the lack of intravenous contrast on today's study. 3. 4.4 cm ascending thoracic aortic aneurysm. Recommend annual imaging followup by CTA or MRA. This recommendation follows 2010 ACCF/AHA/AATS/ACR/ASA/SCA/SCAI/SIR/STS/SVM Guidelines for the Diagnosis and Management of  Patients with Thoracic Aortic Disease. Circulation. 2010; 121: G836-O294. Aortic aneurysm NOS (ICD10-I71.9) 4. Hepatic steatosis. 5. Cholelithiasis. 6. Aortic Atherosclerosis (ICD10-I70.0) and Emphysema (ICD10-J43.9). Electronically Signed   By: Misty Stanley M.D.   On: 07/01/2020 10:21     Assessment and plan- Patient is a 59 y.o. male with newly diagnosed stage IVa c T2 c N0 c M1 adenocarcinoma of the right lung with isolated left frontal lobe metastases he is here to discuss further management  1.  Pathology from the frontal lobe tumor resection is not back yet.  Per my discussion with Dr. [name] the tumor could not be resected in entirety.  He has met with Dr. Donella Stade who plans to get a repeat MRI followed by [facility] to the resection cavity.  Patient is currently on steroids Decadron 2 mg twice daily which will be tapered per Dr. Donella Stade.  2.  Patient reports having significant pain post surgery along the incision site.  I will give him a small dose of oxycodone 5 mg every 6 hours as needed.  I suspect postoperative pain should get better over the next 1 to 2 weeks.  3.  With regards to his lung cancer: Patient noted to have a 3.8 cm right lower lobe lung mass.  No locoregional adenopathy.  He also had a cystoscopy/TURBT which did not show any bladder mass.  Given that he has stage IV disease, I  will opt for concurrent chemoradiation with weekly carbotaxol.  I did speak to [name] about his case and he will be giving him about 5 weeks of radiation treatment which I will plan to give weekly carbotaxol for 5 weeks.  I will plan to get repeat scans after concurrent chemoradiation and consider putting him on maintenance durvalumab at that time.  NGS testing has been sent out on the tumor specimen is currently pending  I will tentatively see him back in 3 weeks time to start first cycle of carbotaxol chemotherapy.  Treatment will be given with a palliative intent   Visit Diagnosis 1. Malignant neoplasm of lower lobe of right lung ([place])   2. Goals of care, counseling/discussion   3. Brain metastases [facility])      Dr. Randa Evens, MD, MPH [facility] at [facility] [phone] 07/26/2020 3:37 PM

## 2020-07-26 NOTE — Progress Notes (Signed)
  Oncology Nurse Navigator Documentation  Navigator Location: [facility] Onc (07/26/20 1300)   )Navigator Encounter Type: Follow-up Appt (07/26/20 1300)                     Patient Visit Type: MedOnc (07/26/20 1300) Treatment Phase: Pre-Tx/Tx Discussion (07/26/20 1300) Barriers/Navigation Needs: Coordination of Care;Anxiety;Financial Toxicity (07/26/20 1300)   Interventions: Coordination of Care;Referrals (07/26/20 1300) Referrals: Social Work (07/26/20 1300) Coordination of Care: Appts;Chemo (07/26/20 1300)       met with patient during follow up visit with [name] to discuss treatment options. All questions answered during visit. Pt voiced concerns with finances and informed that will refer him to social work to further discuss financial assistance. Reviewed upcoming appts. Pt informed that will be notified when he is scheduled for port placement. Nothing further needed at this time. Pt instructed to call with any further questions or needs. Pt verbalized understanding.           Time Spent with Patient: 45 (07/26/20 1300)

## 2020-07-26 NOTE — Progress Notes (Signed)
START OFF PATHWAY REGIMEN - Other   [id number]:Carboplatin + Paclitaxel (2/80) weekly:   Administer weekly:     Paclitaxel      Carboplatin   **Always confirm dose/schedule in your pharmacy ordering system**  Patient Characteristics: Intent of Therapy: Non-Curative / Palliative Intent, Discussed with Patient

## 2020-07-27 ENCOUNTER — Ambulatory Visit: Payer: Medicare HMO | Admitting: Radiation Oncology

## 2020-07-28 ENCOUNTER — Telehealth (INDEPENDENT_AMBULATORY_CARE_PROVIDER_SITE_OTHER): Payer: Self-pay

## 2020-07-28 NOTE — Telephone Encounter (Signed)
Spoke with the patient and he is scheduled with Dr. Delana Meyer for a port placement on 08/03/20 with a 8:45 am arrival time to the [facility]. Covid testing on 08/01/20 between 8-1 pm at the [facility]. Pre-procedure instructions were discussed and will be mailed.

## 2020-07-31 ENCOUNTER — Other Ambulatory Visit: Payer: Self-pay | Admitting: Oncology

## 2020-08-01 ENCOUNTER — Other Ambulatory Visit
Admission: RE | Admit: 2020-08-01 | Discharge: 2020-08-01 | Disposition: A | Discharge status: Home / Self Care | Payer: Medicare HMO | Source: Ambulatory Visit | Attending: Vascular Surgery | Admitting: Vascular Surgery

## 2020-08-01 ENCOUNTER — Encounter: Payer: Self-pay | Admitting: Oncology

## 2020-08-01 ENCOUNTER — Other Ambulatory Visit: Payer: Self-pay

## 2020-08-01 DIAGNOSIS — Z20822 Contact with and (suspected) exposure to covid-19: Secondary | ICD-10-CM | POA: Insufficient documentation

## 2020-08-01 DIAGNOSIS — Z01812 Encounter for preprocedural laboratory examination: Secondary | ICD-10-CM | POA: Diagnosis not present

## 2020-08-02 ENCOUNTER — Inpatient Hospital Stay: Payer: Medicare HMO

## 2020-08-02 ENCOUNTER — Inpatient Hospital Stay: Payer: Medicare HMO | Admitting: Oncology

## 2020-08-02 ENCOUNTER — Encounter: Payer: Self-pay | Admitting: Oncology

## 2020-08-02 LAB — SARS CORONAVIRUS 2 (TAT 6-24 HRS): SARS Coronavirus 2: NEGATIVE

## 2020-08-02 NOTE — Progress Notes (Deleted)
[place]  Telephone:([phone] [phone]  Patient Care Team: Juluis Pitch, MD as PCP - General (Family Medicine) Telford Nab, RN as Oncology Nurse Navigator   Name of the patient: Tyler Gallagher  [id number]  [birth year]   Date of visit: 08/02/20  Diagnosis- ***  Chief complaint/Reason for visit- Initial Meeting for [facility], preparing for starting chemotherapy  I connected with [name] on 08/02/20 at  9:00 AM EDT by {Blank single:19197::"video enabled telemedicine visit","telephone visit"} and verified that I am speaking with the correct person using two identifiers.   I discussed the limitations, risks, security and privacy concerns of performing an evaluation and management service by telemedicine and the availability of in-person appointments. I also discussed with the patient that there may be a patient responsible charge related to this service. The patient expressed understanding and agreed to proceed.   Other persons participating in the visit and their role in the encounter: ***   Patient's location: ***  Provider's location: ***   Heme/Onc history:  Oncology History  Malignant neoplasm of lower lobe of right lung ([place])  07/07/2020 Cancer Staging   Staging form: Lung, AJCC 8th Edition - Clinical stage from 07/07/2020: Stage IVA (cT2a, cN0, cM1b) - Signed by [name], MD on 07/08/2020   07/08/2020 Initial Diagnosis   Malignant neoplasm of lower lobe of right lung ([place])   07/27/2020 -  Chemotherapy   The patient had dexamethasone (DECADRON) 4 MG tablet, 8 mg, Oral, Daily, 1 of 1 cycle, Start date: 07/26/2020, End date: -- PALONOSETRON HCL INJECTION 0.25 MG/5ML, 0.25 mg, Intravenous,  Once, 0 of 4 cycles CARBOplatin (PARAPLATIN) in sodium chloride 0.9 % 100 mL chemo infusion, , Intravenous,  Once, 0 of 4 cycles PACLitaxel (TAXOL) 186 mg in sodium chloride 0.9 % 250 mL chemo infusion  (</= 80mg /m2), 80 mg/m2, Intravenous,  Once, 0 of 4 cycles  for chemotherapy treatment.      Interval history-  *** who presents to chemo care clinic today for initial meeting in preparation for starting chemotherapy. I introduced the chemo care clinic and we discussed that the role of the clinic is to assist those who are at an increased risk of emergency room visits and/or complications during the course of chemotherapy treatment. We discussed that the increased risk takes into account factors such as 59, performance status, and co-morbidities. We also discussed that for some, this might include barriers to care such as not having a primary care provider, lack of insurance/transportation, or not being able to afford medications. We discussed that the goal of the program is to help prevent unplanned ER visits and help reduce complications during chemotherapy. We do this by discussing specific risk factors to each individual and identifying ways that we can help improve these risk factors and reduce barriers to care.   ECOG FS:{CHL ONC KP:[id number]}  Review of systems- ROS   Current treatment- ***  Allergies  Allergen Reactions  . Penicillins Other (See Comments)    unknown    Past Medical History:  Diagnosis Date  . Diabetes mellitus without complication ([place])   . Hyperlipidemia   . Hypertension   . Lesion of brain   . Mass of lung     Past Surgical History:  Procedure Laterality Date  . 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 COLONOSCOPY    . CYSTOSCOPY W/ RETROGRADES Bilateral 07/14/2020   Procedure: CYSTOSCOPY WITH RETROGRADE PYELOGRAM;  Surgeon:  Hollice Espy, MD;  Location: [facility] ORS;  Service: Urology;  Laterality: Bilateral;  . VIDEO BRONCHOSCOPY WITH ENDOBRONCHIAL NAVIGATION N/A 07/01/2020   Procedure: VIDEO BRONCHOSCOPY WITH ENDOBRONCHIAL NAVIGATION;  Surgeon: [name], MD;  Location: [facility] ORS;  Service: Thoracic;  Laterality: N/A;  . VIDEO BRONCHOSCOPY WITH ENDOBRONCHIAL  ULTRASOUND N/A 07/01/2020   Procedure: VIDEO BRONCHOSCOPY WITH ENDOBRONCHIAL ULTRASOUND;  Surgeon: [name], MD;  Location: [facility] ORS;  Service: Thoracic;  Laterality: N/A;    Social History   Socioeconomic History  . Marital status: Divorced    Spouse name: Not on file  . Number of children: Not on file  . Years of education: Not on file  . Highest education level: Not on file  Occupational History  . Not on file  Tobacco Use  . Smoking status: Current Every Day Smoker    Packs/day: 2.00    Years: 32.00    Pack years: 64.00    Types: Cigarettes  . Smokeless tobacco: Never Used  . Tobacco comment: trying to stop - not smoked in two weeks  Vaping Use  . Vaping Use: Never used  Substance and Sexual Activity  . Alcohol use: No    Alcohol/week: 0.0 standard drinks  . Drug use: Yes    Types: Marijuana    Comment: two weeks  . Sexual activity: Not on file  Other Topics Concern  . Not on file  Social History Narrative  . Not on file   Social Determinants of Health   Financial Resource Strain:   . Difficulty of Paying Living Expenses:   Food Insecurity:   . Worried About Charity fundraiser in the Last Year:   . Arboriculturist in the Last Year:   Transportation Needs:   . Film/video editor (Medical):   Marland Kitchen Lack of Transportation (Non-Medical):   Physical Activity:   . Days of Exercise per Week:   . Minutes of Exercise per Session:   Stress:   . Feeling of Stress :   Social Connections:   . Frequency of Communication with Friends and Family:   . Frequency of Social Gatherings with Friends and Family:   . Attends Religious Services:   . Active Member of Clubs or Organizations:   . Attends Archivist Meetings:   Marland Kitchen Marital Status:   Intimate Partner Violence:   . Fear of Current or Ex-Partner:   . Emotionally Abused:   Marland Kitchen Physically Abused:   . Sexually Abused:     Family History  Problem Relation Age of Onset  . Diabetes Sister   . Diabetes  Brother      Current Outpatient Medications:  .  atorvastatin (LIPITOR) 10 MG tablet, Take 10 mg by mouth every morning. , Disp: , Rfl:  .  Cyanocobalamin (RA VITAMIN B-12 TR) 1000 MCG TBCR, Take 1,000 mcg by mouth in the morning and at bedtime. , Disp: , Rfl:  .  dexamethasone (DECADRON) 4 MG tablet, Take 2 tablets (8 mg total) by mouth 2 (two) times daily. (Patient taking differently: Take 4 mg by mouth 2 (two) times daily. ), Disp: 60 tablet, Rfl: 1 .  dexamethasone (DECADRON) 4 MG tablet, Take 1 tablet (4 mg total) by mouth 2 (two) times daily with a meal., Disp: 60 tablet, Rfl: 0 .  dexamethasone (DECADRON) 4 MG tablet, Take 2 tablets (8 mg total) by mouth daily. Start the day after chemotherapy for 2 days., Disp: 30 tablet, Rfl: 1 .  gabapentin (NEURONTIN) 300  MG capsule, Take 900 mg by mouth 3 (three) times daily., Disp: , Rfl:  .  gemfibrozil (LOPID) 600 MG tablet, Take 300 mg by mouth 2 (two) times daily. , Disp: , Rfl:  .  lidocaine-prilocaine (EMLA) cream, Apply to affected area once, Disp: 30 g, Rfl: 3 .  lisinopril-hydrochlorothiazide (ZESTORETIC) 10-12.5 MG tablet, Take 1 tablet by mouth daily., Disp: , Rfl:  .  LORazepam (ATIVAN) 0.5 MG tablet, Take 1 tablet (0.5 mg total) by mouth every 8 (eight) hours as needed for anxiety., Disp: 30 tablet, Rfl: 0 .  metFORMIN (GLUCOPHAGE-XR) 500 MG 24 hr tablet, Take 500 mg by mouth in the morning, at noon, in the evening, and at bedtime. , Disp: , Rfl:  .  ondansetron (ZOFRAN) 8 MG tablet, Take 1 tablet (8 mg total) by mouth 2 (two) times daily as needed for refractory nausea / vomiting. Start on day 3 after chemo., Disp: 30 tablet, Rfl: 1 .  oxyCODONE (OXY IR/ROXICODONE) 5 MG immediate release tablet, Take 1 tablet (5 mg total) by mouth every 6 (six) hours as needed for severe pain., Disp: 30 tablet, Rfl: 0 .  pantoprazole (PROTONIX) 20 MG tablet, Take 1 tablet (20 mg total) by mouth daily., Disp: 30 tablet, Rfl: 1 .  prochlorperazine  (COMPAZINE) 10 MG tablet, Take 1 tablet (10 mg total) by mouth every 6 (six) hours as needed (Nausea or vomiting)., Disp: 30 tablet, Rfl: 1  Physical exam: There were no vitals filed for this visit. Physical Exam   CMP Latest Ref Rng & Units 06/21/2020  Glucose 70 - 99 mg/dL 207(H)  BUN 6 - 20 mg/dL 9  Creatinine 0.61 - 1.24 mg/dL 0.71  Sodium 135 - 145 mmol/L 136  Potassium 3.5 - 5.1 mmol/L 3.5  Chloride 98 - 111 mmol/L 97(L)  CO2 22 - 32 mmol/L 27  Calcium 8.9 - 10.3 mg/dL 9.7  Total Protein 6.5 - 8.1 g/dL 7.7  Total Bilirubin 0.3 - 1.2 mg/dL 0.7  Alkaline Phos 38 - 126 U/L 90  AST 15 - 41 U/L 30  ALT 0 - 44 U/L 42   CBC Latest Ref Rng & Units 06/21/2020  WBC 4.0 - 10.5 K/uL 7.6  Hemoglobin 13.0 - 17.0 g/dL 15.9  Hematocrit 39 - 52 % 45.4  Platelets 150 - 400 K/uL 229    No images are attached to the encounter.  DG OR UROLOGY CYSTO IMAGE ([facility] ONLY)  Result Date: 07/14/2020 There is no interpretation for this exam.  This order is for images obtained during a surgical procedure.  Please See "Surgeries" Tab for more information regarding the procedure.     Assessment and plan- Patient is a 59 y.o. male who presents to [facility] for initial meeting in preparation for starting chemotherapy for the treatment of ***.    1. HPI:  2. Chemo Care Clinic/High Risk for ER/Hospitalization during chemotherapy- We discussed the role of the chemo care clinic and identified patient specific risk factors. I discussed that patient was identified as high risk primarily based on: ***  Patient has past medical history positive for: Past Medical History:  Diagnosis Date  . Diabetes mellitus without complication ([place])   . Hyperlipidemia   . Hypertension   . Lesion of brain   . Mass of lung     Patient has past surgical history positive for: Past Surgical History:  Procedure Laterality Date  . 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 COLONOSCOPY    . CYSTOSCOPY  W/ RETROGRADES Bilateral  07/14/2020   Procedure: CYSTOSCOPY WITH RETROGRADE PYELOGRAM;  Surgeon: Hollice Espy, MD;  Location: [facility] ORS;  Service: Urology;  Laterality: Bilateral;  . VIDEO BRONCHOSCOPY WITH ENDOBRONCHIAL NAVIGATION N/A 07/01/2020   Procedure: VIDEO BRONCHOSCOPY WITH ENDOBRONCHIAL NAVIGATION;  Surgeon: [name], MD;  Location: [facility] ORS;  Service: Thoracic;  Laterality: N/A;  . VIDEO BRONCHOSCOPY WITH ENDOBRONCHIAL ULTRASOUND N/A 07/01/2020   Procedure: VIDEO BRONCHOSCOPY WITH ENDOBRONCHIAL ULTRASOUND;  Surgeon: [name], MD;  Location: [facility] ORS;  Service: Thoracic;  Laterality: N/A;    Based on our high risk symptom management report; this patient has a high risk of ED utilization.  The percentage below indicates how "at risk "  this patient based on the factors in this table within one year.   3. We discussed that social determinants of health may have significant impacts on health and outcomes for cancer patients.  Today we discussed specific social determinants of performance status, alcohol use, depression, financial needs, food insecurity, housing, interpersonal violence, social connections, stress, tobacco use, and transportation.    After lengthy discussion the following were identified as areas of need:   Outpatient services: We discussed options including home based and outpatient services, DME and care program. We discusssed that patients who participate in regular physical activity report fewer negative impacts of cancer and treatments and report less fatigue.   Financial Concerns: We discussed that living with cancer can create tremendous financial burden.  We discussed options for assistance. I asked that if assistance is needed in affording medications or paying bills to please let us know so that we can provide assistance. We discussed options for food including social services, [employer] ($50 every 2 weeks) and onsite food pantry.  We will also notify [facility] to see  if cancer center can provide additional support.  Referral to Social work: Introduced Education officer, museum Elease Etienne and the services he can provide such as support with [employer], cell phone and gas vouchers.   Support groups: We discussed options for support groups at the cancer center. If interested, please notify nurse navigator to enroll. We discussed options for managing stress including healthy eating, exercise as well as participating in no charge counseling services at the cancer center and support groups.  If these are of interest, patient can notify either myself or primary nursing team.We discussed options for management including medications and referral to quit Smart program  Transportation: We discussed options for transportation including acta, paratransit, bus routes, link transit, taxi/uber/lyft, and cancer center [facility].  I have notified primary oncology team who will help assist with arranging Lucianne Lei transportation for appointments when/if needed. We also discussed options for transportation on short notice/acute visits.  Palliative care services: We have palliative care services available in the cancer center to discuss goals of care and advanced care planning.  Please let us know if you have any questions or would like to speak to our palliative nurse practitioner.  Symptom Management Clinic: We discussed our symptom management clinic which is available for acute concerns while receiving treatment such as nausea, vomiting or diarrhea.  We can be reached via telephone at [phone] or through my chart.  We are available for virtual or in person visits on the same day from 830 to 4 PM Monday through Friday. He denies needing specific assistance at this time and He will be followed by Dr. Marland Kitchen clinical team.  Plan: Discussed symptom management clinic. Discussed palliative care services. Discussed resources that are  available here at the cancer center. Discussed medications and new  prescriptions to begin treatment such as anti-nausea or steroids.   Disposition: RTC on   Visit Diagnosis No diagnosis found.  Patient expressed understanding and was in agreement with this plan. He also understands that He can call clinic at any time with any questions, concerns, or complaints.   I provided *** minutes of {Blank single:19197::"face-to-face video visit time","non face-to-face telephone visit time"} during this encounter, and > 50% was spent counseling as documented under my assessment & plan.   [place] at [place]  CC:

## 2020-08-03 ENCOUNTER — Other Ambulatory Visit: Payer: Self-pay

## 2020-08-03 ENCOUNTER — Other Ambulatory Visit (INDEPENDENT_AMBULATORY_CARE_PROVIDER_SITE_OTHER): Payer: Self-pay | Admitting: Nurse Practitioner

## 2020-08-03 ENCOUNTER — Ambulatory Visit
Admission: RE | Admit: 2020-08-03 | Discharge: 2020-08-03 | Disposition: A | Discharge status: Home / Self Care | Payer: Medicare HMO | Attending: Vascular Surgery | Admitting: Vascular Surgery

## 2020-08-03 ENCOUNTER — Encounter
Admission: RE | Disposition: A | Discharge status: Home / Self Care | Payer: Self-pay | Source: Home / Self Care | Attending: Vascular Surgery

## 2020-08-03 ENCOUNTER — Encounter: Payer: Self-pay | Admitting: Vascular Surgery

## 2020-08-03 DIAGNOSIS — Z79899 Other long term (current) drug therapy: Secondary | ICD-10-CM | POA: Insufficient documentation

## 2020-08-03 DIAGNOSIS — Z880 Allergy status to penicillin: Secondary | ICD-10-CM | POA: Insufficient documentation

## 2020-08-03 DIAGNOSIS — E119 Type 2 diabetes mellitus without complications: Secondary | ICD-10-CM | POA: Diagnosis not present

## 2020-08-03 DIAGNOSIS — I10 Essential (primary) hypertension: Secondary | ICD-10-CM | POA: Diagnosis not present

## 2020-08-03 DIAGNOSIS — Z7984 Long term (current) use of oral hypoglycemic drugs: Secondary | ICD-10-CM | POA: Insufficient documentation

## 2020-08-03 DIAGNOSIS — F17210 Nicotine dependence, cigarettes, uncomplicated: Secondary | ICD-10-CM | POA: Diagnosis not present

## 2020-08-03 DIAGNOSIS — E785 Hyperlipidemia, unspecified: Secondary | ICD-10-CM | POA: Diagnosis not present

## 2020-08-03 DIAGNOSIS — C3490 Malignant neoplasm of unspecified part of unspecified bronchus or lung: Secondary | ICD-10-CM | POA: Insufficient documentation

## 2020-08-03 HISTORY — PX: PORTA CATH INSERTION: CATH118285

## 2020-08-03 LAB — GLUCOSE, CAPILLARY: Glucose-Capillary: 129 mg/dL — ABNORMAL HIGH (ref 70–99)

## 2020-08-03 SURGERY — PORTA CATH INSERTION
Anesthesia: Moderate Sedation

## 2020-08-03 MED ORDER — SODIUM CHLORIDE 0.9 % IV SOLN: INTRAVENOUS | Status: DC

## 2020-08-03 MED ORDER — CLINDAMYCIN PHOSPHATE 300 MG/50ML IV SOLN: 300.0000 mg | Freq: Once | INTRAVENOUS | Status: AC

## 2020-08-03 MED ORDER — MIDAZOLAM HCL 5 MG/5ML IJ SOLN
INTRAMUSCULAR | Status: AC
  Filled 2020-08-03: qty 5

## 2020-08-03 MED ORDER — DIPHENHYDRAMINE HCL 50 MG/ML IJ SOLN: 50.0000 mg | Freq: Once | INTRAMUSCULAR | Status: DC | PRN

## 2020-08-03 MED ORDER — METHYLPREDNISOLONE SODIUM SUCC 125 MG IJ SOLR: 125.0000 mg | Freq: Once | INTRAMUSCULAR | Status: DC | PRN

## 2020-08-03 MED ORDER — MIDAZOLAM HCL 2 MG/ML PO SYRP: 8.0000 mg | ORAL_SOLUTION | Freq: Once | ORAL | Status: DC | PRN

## 2020-08-03 MED ORDER — FENTANYL CITRATE (PF) 100 MCG/2ML IJ SOLN
INTRAMUSCULAR | Status: AC
  Filled 2020-08-03: qty 2

## 2020-08-03 MED ORDER — FENTANYL CITRATE (PF) 100 MCG/2ML IJ SOLN
INTRAMUSCULAR | Status: DC | PRN
  Administered 2020-08-03 (×2): 50 ug via INTRAVENOUS

## 2020-08-03 MED ORDER — CLINDAMYCIN PHOSPHATE 300 MG/50ML IV SOLN
INTRAVENOUS | Status: AC
  Administered 2020-08-03: 300 mg via INTRAVENOUS
  Filled 2020-08-03: qty 50

## 2020-08-03 MED ORDER — HYDROMORPHONE HCL 1 MG/ML IJ SOLN: 1.0000 mg | Freq: Once | INTRAMUSCULAR | Status: DC | PRN

## 2020-08-03 MED ORDER — MIDAZOLAM HCL 2 MG/2ML IJ SOLN
INTRAMUSCULAR | Status: DC | PRN
  Administered 2020-08-03 (×2): 2 mg via INTRAVENOUS

## 2020-08-03 MED ORDER — FAMOTIDINE 20 MG PO TABS: 40.0000 mg | ORAL_TABLET | Freq: Once | ORAL | Status: DC | PRN

## 2020-08-03 MED ORDER — ONDANSETRON HCL 4 MG/2ML IJ SOLN: 4.0000 mg | Freq: Four times a day (QID) | INTRAMUSCULAR | Status: DC | PRN

## 2020-08-03 SURGICAL SUPPLY — 12 items
DERMABOND ADVANCED (GAUZE/BANDAGES/DRESSINGS) ×2
DERMABOND ADVANCED .7 DNX12 (GAUZE/BANDAGES/DRESSINGS) ×1 IMPLANT
DRAPE INCISE IOBAN 66X45 STRL (DRAPES) ×3 IMPLANT
KIT PORT POWER 8FR ISP CVUE (Port) ×3 IMPLANT
NEEDLE ENTRY 21GA 7CM ECHOTIP (NEEDLE) ×3 IMPLANT
PACK ANGIOGRAPHY (CUSTOM PROCEDURE TRAY) ×3 IMPLANT
SET INTRO CAPELLA COAXIAL (SET/KITS/TRAYS/PACK) ×3 IMPLANT
SPONGE XRAY 4X4 16PLY STRL (MISCELLANEOUS) ×3 IMPLANT
SUT MNCRL AB 4-0 PS2 18 (SUTURE) ×3 IMPLANT
SUT VIC AB 3-0 CT1 27 (SUTURE) ×2
SUT VIC AB 3-0 CT1 TAPERPNT 27 (SUTURE) ×1 IMPLANT
TOWEL OR 17X26 4PK STRL BLUE (TOWEL DISPOSABLE) ×3 IMPLANT

## 2020-08-03 NOTE — Progress Notes (Signed)
  Chaplain On-Call was asked by fiancee Karie Kirks) of patient to provide prayer with her as she sits in the hallway near Special Procedures. Karie Kirks stated that the patient is having an Infuse-a-Port placed today, in preparation for chemotherapy due to his lung cancer. She requested prayer for herself and the patient as they begin to face this difficult treatment process together.  Chaplain provided much spiritual and emotional support, and prayer for each one.  [place] Azelea Seguin M.Div., [facility]

## 2020-08-03 NOTE — Op Note (Signed)
OPERATIVE NOTE   PROCEDURE: 1. Placement of a right IJ Infuse-a-Port  PRE-OPERATIVE DIAGNOSIS: Metastatic lung carcinoma  POST-OPERATIVE DIAGNOSIS: Same  SURGEON: Katha Cabal M.D.  ANESTHESIA: Conscious sedation was administered under my direct supervision by the interventional radiology RN. IV Versed plus fentanyl were utilized. Continuous ECG, pulse oximetry and blood pressure was monitored throughout the entire procedure. Conscious sedation was for a total of 30 minutes.  ESTIMATED BLOOD LOSS: Minimal   FINDING(S): 1.  Patent vein  SPECIMEN(S): None  INDICATIONS:   Tyler Gallagher is a 59 y.o. male who presents with metastatic lung carcinoma.  He will be undergoing chemotherapy and therefore requires parenteral access.  Risks and benefits of been reviewed patient has agreed to proceed with port placement  DESCRIPTION: After obtaining full informed written consent, the patient was brought back to the special procedure suite and placed in the supine position. The patient's right neck and chest wall are prepped and draped in sterile fashion. Appropriate timeout was called.  Ultrasound is placed in a sterile sleeve, ultrasound is utilized to avoid vascular injury as well as secondary to lack of appropriate landmarks. The right internal jugular vein is identified. It is echolucent and homogeneous as well as easily compressible indicating patency. An image is recorded for the permanent record.  Access to the vein with a micropuncture needle is done under direct ultrasound visualization.  1% lidocaine is infiltrated into the soft tissue at the base of the neck as well as on the chest wall.  Under direct ultrasound visualization a micro-needle is inserted into the vein followed by the micro-wire. Micro-sheath was then advanced and a J wire is inserted without difficulty under fluoroscopic guidance. A small counterincision was created at the wire insertion site. A transverse incision is  created 2 fingerbreadths below the scapula and a pocket is fashioned using both blunt and sharp dissection. The pocket is tested for appropriate size with the hub of the Infuse-a-Port. The tunneling device is then used to pull the intravascular portion of the catheter from the pocket to the neck counterincision.  Dilator and peel-away sheath were then inserted over the wire and the wire is removed. Catheter is then advanced into the venous system without difficulty. Peel-away sheath was then removed.  Catheter is then positioned under fluoroscopic guidance at the atrial caval junction. It is then transected connected to the hub and the hope is slipped into the subcutaneous pocket on the chest wall. The hub was then accessed percutaneously and aspirates easily and flushes well and is flushed with 30 cc of heparinized saline. The pocket incision is then closed in layers using interrupted 3-0 Vicryl for the subcutaneous tissues and 4-0 Monocryl subcuticular for skin closure. Dermabond is applied. The neck counterincision was closed with 4-0 Monocryl subcuticular and Dermabond as well.  The patient tolerated the procedure well and there were no immediate complications.  COMPLICATIONS: None  CONDITION: Unchanged  Katha Cabal M.D. [facility] Office: [phone]   08/03/2020, 10:56 AM

## 2020-08-03 NOTE — H&P (Signed)
@LOGO @   MRN : [id number]  Tyler Gallagher is a 59 y.o. (1961/11/10) male who presents with chief complaint of metastatic adenocarcinoma of the lung.  History of Present Illness:  Patient is sent by [name] for a Infuse-a-Port placement.  Patient has biopsy-proven metastatic adenocarcinoma the lung.  Given this finding he will require parenteral chemotherapy and therefore requires appropriate parenteral access.  Infuse-a-Port is indicated.  Current Meds  Medication Sig  . atorvastatin (LIPITOR) 10 MG tablet Take 10 mg by mouth every morning.   . Cyanocobalamin (RA VITAMIN B-12 TR) 1000 MCG TBCR Take 1,000 mcg by mouth in the morning and at bedtime.   Marland Kitchen dexamethasone (DECADRON) 4 MG tablet Take 2 tablets (8 mg total) by mouth 2 (two) times daily. (Patient taking differently: Take 4 mg by mouth 2 (two) times daily. )  . gabapentin (NEURONTIN) 300 MG capsule Take 900 mg by mouth 3 (three) times daily.  Marland Kitchen gemfibrozil (LOPID) 600 MG tablet Take 300 mg by mouth 2 (two) times daily.   Marland Kitchen lisinopril-hydrochlorothiazide (ZESTORETIC) 10-12.5 MG tablet Take 1 tablet by mouth daily.  Marland Kitchen LORazepam (ATIVAN) 0.5 MG tablet Take 1 tablet (0.5 mg total) by mouth every 8 (eight) hours as needed for anxiety.  . metFORMIN (GLUCOPHAGE-XR) 500 MG 24 hr tablet Take 500 mg by mouth in the morning, at noon, in the evening, and at bedtime.     Past Medical History:  Diagnosis Date  . Diabetes mellitus without complication ([place])   . Hyperlipidemia   . Hypertension   . Lesion of brain   . Mass of lung     Past Surgical History:  Procedure Laterality Date  . 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 COLONOSCOPY    . CYSTOSCOPY W/ RETROGRADES Bilateral 07/14/2020   Procedure: CYSTOSCOPY WITH RETROGRADE PYELOGRAM;  Surgeon: Hollice Espy, MD;  Location: [facility];  Service: Urology;  Laterality: Bilateral;  . VIDEO BRONCHOSCOPY WITH ENDOBRONCHIAL NAVIGATION N/A 07/01/2020   Procedure: VIDEO BRONCHOSCOPY WITH ENDOBRONCHIAL  NAVIGATION;  Surgeon: [name], MD;  Location: [facility];  Service: Thoracic;  Laterality: N/A;  . VIDEO BRONCHOSCOPY WITH ENDOBRONCHIAL ULTRASOUND N/A 07/01/2020   Procedure: VIDEO BRONCHOSCOPY WITH ENDOBRONCHIAL ULTRASOUND;  Surgeon: [name], MD;  Location: [facility];  Service: Thoracic;  Laterality: N/A;    Social History Social History   Tobacco Use  . Smoking status: Current Every Day Smoker    Packs/day: 2.00    Years: 32.00    Pack years: 64.00    Types: Cigarettes  . Smokeless tobacco: Never Used  . Tobacco comment: trying to stop - not smoked in two weeks  Vaping Use  . Vaping Use: Never used  Substance Use Topics  . Alcohol use: No    Alcohol/week: 0.0 standard drinks  . Drug use: Yes    Types: Marijuana    Comment: two weeks    Family History Family History  Problem Relation Age of Onset  . Diabetes Sister   . Diabetes Brother     Allergies  Allergen Reactions  . Penicillins Other (See Comments)    unknown     REVIEW OF SYSTEMS (Negative unless checked)  Constitutional: [] Weight loss  [] Fever  [] Chills Cardiac: [] Chest pain   [] Chest pressure   [] Palpitations   [] Shortness of breath when laying flat   [] Shortness of breath with exertion. Vascular:  [] Pain in legs with walking   [] Pain in legs at rest  [] History of DVT   [] Phlebitis   []   Swelling in legs   [] Varicose veins   [] Non-healing ulcers Pulmonary:   [] Uses home oxygen   [] Productive cough   [] Hemoptysis   [] Wheeze  [] COPD   [] Asthma Neurologic:  [] Dizziness   [] Seizures   [] History of stroke   [] History of TIA  [] Aphasia   [] Vissual changes   [] Weakness or numbness in arm   [] Weakness or numbness in leg Musculoskeletal:   [] Joint swelling   [] Joint pain   [] Low back pain Hematologic:  [] Easy bruising  [] Easy bleeding   [] Hypercoagulable state   [] Anemic Gastrointestinal:  [] Diarrhea   [] Vomiting  [] Gastroesophageal reflux/heartburn   [] Difficulty swallowing. Genitourinary:  [] Chronic  kidney disease   [] Difficult urination  [] Frequent urination   [] Blood in urine Skin:  [] Rashes   [] Ulcers  Psychological:  [] History of anxiety   []  History of major depression.  Physical Examination  Vitals:   08/03/20 0901  BP: (!) 133/97  Pulse: 78  Resp: 20  Temp: 97.8 F (36.6 C)  TempSrc: Oral  SpO2: 97%  Weight: 104.3 kg  Height: 6\' 3"  (1.905 m)   Body mass index is 28.75 kg/m. Gen: WD/WN, NAD Head: [place]/AT, No temporalis wasting.  Ear/Nose/Throat: Hearing grossly intact, nares w/o erythema or drainage Eyes: PER, EOMI, sclera nonicteric.  Neck: Supple, no large masses.   Pulmonary:  Good air movement, no audible wheezing bilaterally, no use of accessory muscles.  Cardiac: RRR, no JVD Vascular: Right neck and chest wall clean dry and intact Vessel Right Left  Radial Palpable Palpable  Gastrointestinal: Non-distended. No guarding/no peritoneal signs.  Musculoskeletal: M/S 5/5 throughout.  No deformity or atrophy.  Neurologic: CN 2-12 intact. Symmetrical.  Speech is fluent. Motor exam as listed above. Psychiatric: Judgment intact, Mood & affect appropriate for pt's clinical situation. Dermatologic: No rashes or ulcers noted.  No changes consistent with cellulitis.  CBC Lab Results  Component Value Date   WBC 7.6 06/21/2020   HGB 15.9 06/21/2020   HCT 45.4 06/21/2020   MCV 87.6 06/21/2020   PLT 229 06/21/2020    BMET    Component Value Date/Time   NA 136 06/21/2020 1159   K 3.5 06/21/2020 1159   CL 97 (L) 06/21/2020 1159   CO2 27 06/21/2020 1159   GLUCOSE 207 (H) 06/21/2020 1159   BUN 9 06/21/2020 1159   CREATININE 0.71 06/21/2020 1159   CALCIUM 9.7 06/21/2020 1159   GFRNONAA >60 06/21/2020 1159   GFRAA >60 06/21/2020 1159   CrCl cannot be calculated (Patient's most recent lab result is older than the maximum 21 days allowed.).  COAG Lab Results  Component Value Date   INR 0.9 06/30/2020   INR 1.0 06/21/2020   INR 0.96 01/01/2016     Radiology DG OR UROLOGY CYSTO IMAGE ([place])  Result Date: 07/14/2020 There is no interpretation for this exam.  This order is for images obtained during a surgical procedure.  Please See "Surgeries" Tab for more information regarding the procedure.     Assessment/Plan 1.  Metastatic adenocarcinoma of the lung: Patient will require parenteral chemotherapy.  Given this he will require appropriate parenteral IV access.  Infuse-a-Port is indicated.  Risks and benefits of been reviewed patient agrees to proceed.  Hortencia Pilar, MD  08/03/2020 10:02 AM

## 2020-08-08 ENCOUNTER — Other Ambulatory Visit: Payer: Self-pay

## 2020-08-08 ENCOUNTER — Inpatient Hospital Stay (HOSPITAL_BASED_OUTPATIENT_CLINIC_OR_DEPARTMENT_OTHER): Payer: Medicare HMO | Admitting: Oncology

## 2020-08-08 ENCOUNTER — Inpatient Hospital Stay: Payer: Medicare HMO | Attending: Oncology

## 2020-08-08 DIAGNOSIS — F17210 Nicotine dependence, cigarettes, uncomplicated: Secondary | ICD-10-CM | POA: Diagnosis not present

## 2020-08-08 DIAGNOSIS — Z794 Long term (current) use of insulin: Secondary | ICD-10-CM | POA: Insufficient documentation

## 2020-08-08 DIAGNOSIS — C3431 Malignant neoplasm of lower lobe, right bronchus or lung: Secondary | ICD-10-CM | POA: Diagnosis not present

## 2020-08-08 DIAGNOSIS — E669 Obesity, unspecified: Secondary | ICD-10-CM | POA: Diagnosis not present

## 2020-08-08 DIAGNOSIS — Z79899 Other long term (current) drug therapy: Secondary | ICD-10-CM | POA: Diagnosis not present

## 2020-08-08 DIAGNOSIS — J449 Chronic obstructive pulmonary disease, unspecified: Secondary | ICD-10-CM | POA: Insufficient documentation

## 2020-08-08 DIAGNOSIS — Z7952 Long term (current) use of systemic steroids: Secondary | ICD-10-CM | POA: Insufficient documentation

## 2020-08-08 DIAGNOSIS — I10 Essential (primary) hypertension: Secondary | ICD-10-CM | POA: Insufficient documentation

## 2020-08-08 DIAGNOSIS — C7931 Secondary malignant neoplasm of brain: Secondary | ICD-10-CM | POA: Diagnosis not present

## 2020-08-08 DIAGNOSIS — E119 Type 2 diabetes mellitus without complications: Secondary | ICD-10-CM | POA: Insufficient documentation

## 2020-08-08 DIAGNOSIS — E785 Hyperlipidemia, unspecified: Secondary | ICD-10-CM | POA: Diagnosis not present

## 2020-08-08 DIAGNOSIS — Z8673 Personal history of transient ischemic attack (TIA), and cerebral infarction without residual deficits: Secondary | ICD-10-CM | POA: Insufficient documentation

## 2020-08-08 NOTE — Progress Notes (Signed)
Pharmacist Chemotherapy Monitoring - Initial Assessment    Anticipated start date: 08/15/20  Regimen:   Are orders appropriate based on the patients diagnosis, regimen, and cycle? Yes  Does the plan date match the patients scheduled date? Yes  Is the sequencing of drugs appropriate? Yes  Are the premedications appropriate for the patients regimen? Yes  Prior Authorization for treatment is: Approved o If applicable, is the correct biosimilar selected based on the patient's insurance? not applicable  Organ Function and Labs:  Are dose adjustments needed based on the patient's renal function, hepatic function, or hematologic function? No  Are appropriate labs ordered prior to the start of patient's treatment? Yes  Other organ system assessment, if indicated: N/A  The following baseline labs, if indicated, have been ordered: N/A  Dose Assessment:  Are the drug doses appropriate? Yes  Are the following correct: o Drug concentrations Yes o IV fluid compatible with drug Yes o Administration routes Yes o Timing of therapy Yes  If applicable, does the patient have documented access for treatment and/or plans for port-a-cath placement? yes  If applicable, have lifetime cumulative doses been properly documented and assessed? no Lifetime Dose Tracking  No doses have been documented on this patient for the following tracked chemicals: Doxorubicin, Epirubicin, Idarubicin, Daunorubicin, Mitoxantrone, Bleomycin, Oxaliplatin, Carboplatin, Liposomal Doxorubicin  o   Toxicity Monitoring/Prevention:  The patient has the following take home antiemetics prescribed: Ondansetron, Prochlorperazine and Dexamethasone  The patient has the following take home medications prescribed: N/A  Medication allergies and previous infusion related reactions, if applicable, have been reviewed and addressed. Yes  The patient's current medication list has been assessed for drug-drug interactions with  their chemotherapy regimen. no significant drug-drug interactions were identified on review.  Order Review:  Are the treatment plan orders signed? No  Is the patient scheduled to see a provider prior to their treatment? Yes  I verify that I have reviewed each item in the above checklist and answered each question accordingly.  Adelina Mings 08/08/2020 8:35 AM

## 2020-08-08 NOTE — Progress Notes (Signed)
Tyler Gallagher  [phone] [phone]  Patient Care Team: Juluis Pitch, MD as PCP - General (Family Medicine) Telford Nab, RN as Oncology Nurse Navigator   Name of the patient: Tyler Gallagher  [id number]  21-Feb-1961   Date of visit: 08/08/20  Diagnosis-metastatic lung cancer  Chief complaint/Reason for visit- Initial Meeting for [facility], preparing for starting chemotherapy  Heme/Onc history:  Oncology History  Malignant neoplasm of lower lobe of right lung ([place])  07/07/2020 Cancer Staging   Staging form: Lung, AJCC 8th Edition - Clinical stage from 07/07/2020: Stage IVA (cT2a, cN0, cM1b) - Signed by [name], MD on 07/08/2020   07/08/2020 Initial Diagnosis   Malignant neoplasm of lower lobe of right lung ([place])   07/27/2020 -  Chemotherapy   The patient had dexamethasone (DECADRON) 4 MG tablet, 8 mg, Oral, Daily, 1 of 1 cycle, Start date: 07/26/2020, End date: -- PALONOSETRON HCL INJECTION 0.25 MG/5ML, 0.25 mg, Intravenous,  Once, 0 of 4 cycles CARBOplatin (PARAPLATIN) in sodium chloride 0.9 % 100 mL chemo infusion, , Intravenous,  Once, 0 of 4 cycles PACLitaxel (TAXOL) 186 mg in sodium chloride 0.9 % 250 mL chemo infusion (</= 80mg /m2), 80 mg/m2, Intravenous,  Once, 0 of 4 cycles  for chemotherapy treatment.      Interval history-Tyler Gallagher is a 59 year old male who presents to chemo care clinic today for initial meeting in preparation for starting chemotherapy. I introduced the chemo care clinic and we discussed that the role of the clinic is to assist those who are at an increased risk of emergency room visits and/or complications during the course of chemotherapy treatment. We discussed that the increased risk takes into account factors such as age, performance status, and co-morbidities. We also discussed that for some, this might include barriers to care such as not having a primary care  provider, lack of insurance/transportation, or not being able to afford medications. We discussed that the goal of the program is to help prevent unplanned ER visits and help reduce complications during chemotherapy. We do this by discussing specific risk factors to each individual and identifying ways that we can help improve these risk factors and reduce barriers to care.   ECOG FS:2 - Symptomatic, <50% confined to bed  Review of systems- ROS   Current treatment-   Allergies  Allergen Reactions  . Penicillins Other (See Comments)    unknown    Past Medical History:  Diagnosis Date  . Diabetes mellitus without complication ([place])   . Hyperlipidemia   . Hypertension   . Lesion of brain   . Mass of lung     Past Surgical History:  Procedure Laterality Date  . 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 COLONOSCOPY    . CYSTOSCOPY W/ RETROGRADES Bilateral 07/14/2020   Procedure: CYSTOSCOPY WITH RETROGRADE PYELOGRAM;  Surgeon: Hollice Espy, MD;  Location: [facility] ORS;  Service: Urology;  Laterality: Bilateral;  . PORTA CATH INSERTION N/A 08/03/2020   Procedure: PORTA CATH INSERTION;  Surgeon: Katha Cabal, MD;  Location: [place] CV LAB;  Service: Cardiovascular;  Laterality: N/A;  . VIDEO BRONCHOSCOPY WITH ENDOBRONCHIAL NAVIGATION N/A 07/01/2020   Procedure: VIDEO BRONCHOSCOPY WITH ENDOBRONCHIAL NAVIGATION;  Surgeon: [name], MD;  Location: [facility] ORS;  Service: Thoracic;  Laterality: N/A;  . VIDEO BRONCHOSCOPY WITH ENDOBRONCHIAL ULTRASOUND N/A 07/01/2020   Procedure: VIDEO BRONCHOSCOPY WITH ENDOBRONCHIAL ULTRASOUND;  Surgeon: [name], MD;  Location: [facility] ORS;  Service: Thoracic;  Laterality: N/A;    Social History   Socioeconomic History  . Marital status: Divorced    Spouse name: Not on file  . Number of children: Not on file  . Years of education: Not on file  . Highest education level: Not on file  Occupational History  . Not on file  Tobacco Use  . Smoking  status: Current Every Day Smoker    Packs/day: 2.00    Years: 32.00    Pack years: 64.00    Types: Cigarettes  . Smokeless tobacco: Never Used  . Tobacco comment: trying to stop - not smoked in two weeks  Vaping Use  . Vaping Use: Never used  Substance and Sexual Activity  . Alcohol use: No    Alcohol/week: 0.0 standard drinks  . Drug use: Yes    Types: Marijuana    Comment: two weeks  . Sexual activity: Not on file  Other Topics Concern  . Not on file  Social History Narrative  . Not on file   Social Determinants of Health   Financial Resource Strain:   . Difficulty of Paying Living Expenses:   Food Insecurity:   . Worried About Charity fundraiser in the Last Year:   . Arboriculturist in the Last Year:   Transportation Needs:   . Film/video editor (Medical):   Marland Kitchen Lack of Transportation (Non-Medical):   Physical Activity:   . Days of Exercise per Week:   . Minutes of Exercise per Session:   Stress:   . Feeling of Stress :   Social Connections:   . Frequency of Communication with Friends and Family:   . Frequency of Social Gatherings with Friends and Family:   . Attends Religious Services:   . Active Member of Clubs or Organizations:   . Attends Archivist Meetings:   Marland Kitchen Marital Status:   Intimate Partner Violence:   . Fear of Current or Ex-Partner:   . Emotionally Abused:   Marland Kitchen Physically Abused:   . Sexually Abused:     Family History  Problem Relation Age of Onset  . Diabetes Sister   . Diabetes Brother      Current Outpatient Medications:  .  atorvastatin (LIPITOR) 10 MG tablet, Take 10 mg by mouth every morning. , Disp: , Rfl:  .  [employer] SENNA PLUS 8.6-50 MG tablet, Take 2 tablets by mouth 2 (two) times daily., Disp: , Rfl:  .  Cyanocobalamin (RA VITAMIN B-12 TR) 1000 MCG TBCR, Take 1,000 mcg by mouth in the morning and at bedtime. , Disp: , Rfl:  .  dexamethasone (DECADRON) 4 MG tablet, Take 2 tablets (8 mg total) by mouth 2 (two) times  daily. (Patient taking differently: Take 4 mg by mouth 2 (two) times daily. ), Disp: 60 tablet, Rfl: 1 .  dexamethasone (DECADRON) 4 MG tablet, Take 1 tablet (4 mg total) by mouth 2 (two) times daily with a meal., Disp: 60 tablet, Rfl: 0 .  dexamethasone (DECADRON) 4 MG tablet, Take 2 tablets (8 mg total) by mouth daily. Start the day after chemotherapy for 2 days., Disp: 30 tablet, Rfl: 1 .  gabapentin (NEURONTIN) 300 MG capsule, Take 900 mg by mouth 3 (three) times daily., Disp: , Rfl:  .  gemfibrozil (LOPID) 600 MG tablet, Take 300 mg by mouth 2 (two) times daily. , Disp: , Rfl:  .  HUMULIN R 100 UNIT/ML injection, SMARTSIG:16 Unit(s) SUB-Q 3 Times Daily, Disp: ,  Rfl:  .  levETIRAcetam (KEPPRA) 1000 MG tablet, Take by mouth as directed., Disp: , Rfl:  .  lidocaine-prilocaine (EMLA) cream, Apply to affected area once, Disp: 30 g, Rfl: 3 .  lisinopril-hydrochlorothiazide (ZESTORETIC) 10-12.5 MG tablet, Take 1 tablet by mouth daily., Disp: , Rfl:  .  LORazepam (ATIVAN) 0.5 MG tablet, Take 1 tablet (0.5 mg total) by mouth every 8 (eight) hours as needed for anxiety., Disp: 30 tablet, Rfl: 0 .  metFORMIN (GLUCOPHAGE-XR) 500 MG 24 hr tablet, Take 500 mg by mouth in the morning, at noon, in the evening, and at bedtime. , Disp: , Rfl:  .  ondansetron (ZOFRAN) 4 MG tablet, Take 4 mg by mouth every 8 (eight) hours as needed., Disp: , Rfl:  .  ondansetron (ZOFRAN) 8 MG tablet, Take 1 tablet (8 mg total) by mouth 2 (two) times daily as needed for refractory nausea / vomiting. Start on day 3 after chemo. (Patient not taking: Reported on 08/03/2020), Disp: 30 tablet, Rfl: 1 .  oxyCODONE (OXY IR/ROXICODONE) 5 MG immediate release tablet, Take 1 tablet (5 mg total) by mouth every 6 (six) hours as needed for severe pain., Disp: 30 tablet, Rfl: 0 .  pantoprazole (PROTONIX) 20 MG tablet, Take 1 tablet (20 mg total) by mouth daily. (Patient not taking: Reported on 08/03/2020), Disp: 30 tablet, Rfl: 1 .  polyethylene  glycol powder (GLYCOLAX/MIRALAX) 17 GM/SCOOP powder, Take by mouth., Disp: , Rfl:  .  prochlorperazine (COMPAZINE) 10 MG tablet, Take 1 tablet (10 mg total) by mouth every 6 (six) hours as needed (Nausea or vomiting). (Patient not taking: Reported on 08/03/2020), Disp: 30 tablet, Rfl: 1  Physical exam: There were no vitals filed for this visit. Physical Exam Constitutional:      Appearance: Normal appearance.  HENT:     Head: Normocephalic and atraumatic.  Eyes:     Pupils: Pupils are equal, round, and reactive to light.  Cardiovascular:     Rate and Rhythm: Normal rate and regular rhythm.     Heart sounds: Normal heart sounds. No murmur heard.   Pulmonary:     Effort: Pulmonary effort is normal.     Breath sounds: Normal breath sounds. No wheezing.  Abdominal:     General: Bowel sounds are normal. There is no distension.     Palpations: Abdomen is soft.     Tenderness: There is no abdominal tenderness.  Musculoskeletal:        General: Normal range of motion.     Cervical back: Normal range of motion.  Skin:    General: Skin is warm and dry.     Findings: No rash.  Neurological:     Mental Status: He is alert and oriented to person, place, and time.  Psychiatric:        Judgment: Judgment normal.      CMP Latest Ref Rng & Units 06/21/2020  Glucose 70 - 99 mg/dL 207(H)  BUN 6 - 20 mg/dL 9  Creatinine 0.61 - 1.24 mg/dL 0.71  Sodium 135 - 145 mmol/L 136  Potassium 3.5 - 5.1 mmol/L 3.5  Chloride 98 - 111 mmol/L 97(L)  CO2 22 - 32 mmol/L 27  Calcium 8.9 - 10.3 mg/dL 9.7  Total Protein 6.5 - 8.1 g/dL 7.7  Total Bilirubin 0.3 - 1.2 mg/dL 0.7  Alkaline Phos 38 - 126 U/L 90  AST 15 - 41 U/L 30  ALT 0 - 44 U/L 42   CBC Latest Ref Rng & Units 06/21/2020  WBC 4.0 - 10.5 K/uL 7.6  Hemoglobin 13.0 - 17.0 g/dL 15.9  Hematocrit 39 - 52 % 45.4  Platelets 150 - 400 K/uL 229    No images are attached to the encounter.  PERIPHERAL VASCULAR CATHETERIZATION  Result Date:  08/03/2020 See Op Note  DG OR UROLOGY CYSTO IMAGE ([place])  Result Date: 07/14/2020 There is no interpretation for this exam.  This order is for images obtained during a surgical procedure.  Please See "Surgeries" Tab for more information regarding the procedure.     Assessment and plan- Patient is a 59 y.o. male who presents to [facility] for initial meeting in preparation for starting chemotherapy for the treatment of metastatic lung cancer.   HPI: Mr. Hassell Done is a 59 year old male with past medical history significant for TIA type 2 diabetes, hyperlipidemia, tobacco abuse, obesity, above-knee amputation and recent diagnosis of right lower lung mass.  He initially presented to [facility] ED for right upper extremity weakness x3 to 4 days.  Work-up included CT abdomen pelvis, brain MRI of brain which unfortunately revealed a solitary brain mass.  He was discharged home on oral steroids with short-term follow-up with [name].    PET scan (06/29/2020) showed hypermetabolic right lower lobe mass compatible with malignancy no hypermetabolic adenopathy or evidence of metastatic disease.    Had bronchoscopy with [name] on which showed non-small cell lung cancer favoring adenocarcinoma.    Initial consult with Dr. Donella Stade on 07/07/2020 and the plan was for [facility] to the brain lesion.  He was also offered SBRT to his right lower lobe.   Had TURBT for diagnostic purposes with Dr. Erlene Quan- negative for malignancy  Had craniectomy with Dr. [name] on 07/18/2020.  Plan is for concurrent carbotaxol for [redacted] weeks along with radiation.  2. Chemo Care Clinic/High Risk for ER/Hospitalization during chemotherapy- We discussed the role of the chemo care clinic and identified patient specific risk factors. I discussed that patient was identified as high risk primarily based on: Stage of disease.  Patient has past medical history positive for: Past Medical History:  Diagnosis Date  . Diabetes mellitus without  complication ([place])   . Hyperlipidemia   . Hypertension   . Lesion of brain   . Mass of lung     Patient has past surgical history positive for: Past Surgical History:  Procedure Laterality Date  . 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 COLONOSCOPY    . CYSTOSCOPY W/ RETROGRADES Bilateral 07/14/2020   Procedure: CYSTOSCOPY WITH RETROGRADE PYELOGRAM;  Surgeon: Hollice Espy, MD;  Location: [facility] ORS;  Service: Urology;  Laterality: Bilateral;  . PORTA CATH INSERTION N/A 08/03/2020   Procedure: PORTA CATH INSERTION;  Surgeon: Katha Cabal, MD;  Location: [place] CV LAB;  Service: Cardiovascular;  Laterality: N/A;  . VIDEO BRONCHOSCOPY WITH ENDOBRONCHIAL NAVIGATION N/A 07/01/2020   Procedure: VIDEO BRONCHOSCOPY WITH ENDOBRONCHIAL NAVIGATION;  Surgeon: [name], MD;  Location: [facility] ORS;  Service: Thoracic;  Laterality: N/A;  . VIDEO BRONCHOSCOPY WITH ENDOBRONCHIAL ULTRASOUND N/A 07/01/2020   Procedure: VIDEO BRONCHOSCOPY WITH ENDOBRONCHIAL ULTRASOUND;  Surgeon: [name], MD;  Location: [facility] ORS;  Service: Thoracic;  Laterality: N/A;    Based on our high risk symptom management report; this patient has a high risk of ED utilization.  The percentage below indicates how "at risk "  this patient based on the factors in this table within one year.   General Risk Score: 8  Values used to calculate this score:  Points  Metrics      0        Age: 45      [facility] Admissions: 5      3        ED Visits: 5      1        Has Chronic Obstructive Pulmonary Disease: Yes      1        Has Diabetes: Yes      0        Has Congestive Heart Failure: No      0        Has liver disease: No      0        Has Depression: No      0        Current PCP: Juluis Pitch, MD      0        Has Medicaid: No  3. We discussed that social determinants of health may have significant impacts on health and outcomes for cancer patients.  Today we discussed specific social determinants of performance  status, alcohol use, depression, financial needs, food insecurity, housing, interpersonal violence, social connections, stress, tobacco use, and transportation.    After lengthy discussion the following were identified as areas of need: None at this time  Outpatient services: We discussed options including home based and outpatient services, DME and care program. We discusssed that patients who participate in regular physical activity report fewer negative impacts of cancer and treatments and report less fatigue.   Financial Concerns: We discussed that living with cancer can create tremendous financial burden.  We discussed options for assistance. I asked that if assistance is needed in affording medications or paying bills to please let us know so that we can provide assistance. We discussed options for food including social services, [employer] ($50 every 2 weeks) and onsite food pantry.  We will also notify [facility] to see if cancer center can provide additional support.  Referral to Social work: Introduced Education officer, museum Elease Etienne and the services he can provide such as support with [employer], cell phone and gas vouchers.   Support groups: We discussed options for support groups at the cancer center. If interested, please notify nurse navigator to enroll. We discussed options for managing stress including healthy eating, exercise as well as participating in no charge counseling services at the cancer center and support groups.  If these are of interest, patient can notify either myself or primary nursing team.We discussed options for management including medications and referral to quit Smart program  Transportation: We discussed options for transportation including acta, paratransit, bus routes, link transit, taxi/uber/lyft, and cancer center [place].  I have notified primary oncology team who will help assist with arranging Lucianne Lei transportation for appointments when/if needed. We also  discussed options for transportation on short notice/acute visits.  Palliative care services: We have palliative care services available in the cancer center to discuss goals of care and advanced care planning.  Please let us know if you have any questions or would like to speak to our palliative nurse practitioner.  Symptom Management Clinic: We discussed our symptom management clinic which is available for acute concerns while receiving treatment such as nausea, vomiting or diarrhea.  We can be reached via telephone at [phone] or through my chart.  We are available for virtual or in person visits on the same day  from 830 to 4 PM Monday through Friday. He denies needing specific assistance at this time and He will be followed by [name] clinical team.  Plan: Discussed symptom management clinic. Discussed palliative care services. Discussed resources that are available here at the cancer center. Discussed medications and new prescriptions to begin treatment such as anti-nausea or steroids.   Disposition: RTC on 08/09/2020 for simulation prior to beginning radiation. RTC on 08/11/2020 for MRI of brain. RTC on 08/15/2020 for lab work, MD assessment and to begin chemotherapy.  Visit Diagnosis 1. Malignant neoplasm of lower lobe of right lung [facility])     Patient expressed understanding and was in agreement with this plan. He also understands that He can call clinic at any time with any questions, concerns, or complaints.   Greater than 50% was spent in counseling and coordination of care with this patient including but not limited to discussion of the relevant topics above (See A&P) including, but not limited to diagnosis and management of acute and chronic medical conditions.   [place] [facility] [place]  CC: [name]

## 2020-08-09 ENCOUNTER — Ambulatory Visit: Admission: RE | Admit: 2020-08-09 | Payer: Medicare HMO | Source: Ambulatory Visit

## 2020-08-09 ENCOUNTER — Ambulatory Visit
Admission: RE | Admit: 2020-08-09 | Discharge: 2020-08-09 | Disposition: A | Discharge status: Home / Self Care | Payer: Medicare HMO | Source: Ambulatory Visit | Attending: Radiation Oncology | Admitting: Radiation Oncology

## 2020-08-09 DIAGNOSIS — Z510 Encounter for antineoplastic radiation therapy: Secondary | ICD-10-CM | POA: Diagnosis not present

## 2020-08-09 DIAGNOSIS — C7931 Secondary malignant neoplasm of brain: Secondary | ICD-10-CM | POA: Insufficient documentation

## 2020-08-09 DIAGNOSIS — C3431 Malignant neoplasm of lower lobe, right bronchus or lung: Secondary | ICD-10-CM | POA: Insufficient documentation

## 2020-08-09 DIAGNOSIS — C3491 Malignant neoplasm of unspecified part of right bronchus or lung: Secondary | ICD-10-CM | POA: Diagnosis not present

## 2020-08-11 ENCOUNTER — Other Ambulatory Visit: Payer: Self-pay

## 2020-08-11 ENCOUNTER — Ambulatory Visit
Admission: RE | Admit: 2020-08-11 | Discharge: 2020-08-11 | Disposition: A | Discharge status: Home / Self Care | Payer: Medicare HMO | Source: Ambulatory Visit | Attending: Radiation Oncology | Admitting: Radiation Oncology

## 2020-08-11 ENCOUNTER — Ambulatory Visit: Payer: Medicare HMO | Admitting: Oncology

## 2020-08-11 DIAGNOSIS — G9389 Other specified disorders of brain: Secondary | ICD-10-CM | POA: Diagnosis not present

## 2020-08-11 DIAGNOSIS — I6389 Other cerebral infarction: Secondary | ICD-10-CM | POA: Diagnosis not present

## 2020-08-11 DIAGNOSIS — R9082 White matter disease, unspecified: Secondary | ICD-10-CM | POA: Diagnosis not present

## 2020-08-11 DIAGNOSIS — C7931 Secondary malignant neoplasm of brain: Secondary | ICD-10-CM | POA: Insufficient documentation

## 2020-08-11 DIAGNOSIS — Z510 Encounter for antineoplastic radiation therapy: Secondary | ICD-10-CM | POA: Diagnosis not present

## 2020-08-11 DIAGNOSIS — C3431 Malignant neoplasm of lower lobe, right bronchus or lung: Secondary | ICD-10-CM | POA: Diagnosis not present

## 2020-08-11 DIAGNOSIS — C3491 Malignant neoplasm of unspecified part of right bronchus or lung: Secondary | ICD-10-CM | POA: Diagnosis not present

## 2020-08-11 MED ORDER — GADOBUTROL 1 MMOL/ML IV SOLN
10.0000 mL | Freq: Once | INTRAVENOUS | Status: AC | PRN
  Administered 2020-08-11: 10 mL via INTRAVENOUS

## 2020-08-12 ENCOUNTER — Ambulatory Visit: Payer: Medicare HMO

## 2020-08-15 ENCOUNTER — Telehealth: Payer: Self-pay | Admitting: *Deleted

## 2020-08-15 ENCOUNTER — Ambulatory Visit: Payer: Medicare HMO

## 2020-08-15 ENCOUNTER — Inpatient Hospital Stay: Payer: Medicare HMO | Admitting: Hospice and Palliative Medicine

## 2020-08-15 ENCOUNTER — Other Ambulatory Visit: Payer: Self-pay

## 2020-08-15 ENCOUNTER — Inpatient Hospital Stay: Payer: Medicare HMO

## 2020-08-15 ENCOUNTER — Inpatient Hospital Stay (HOSPITAL_BASED_OUTPATIENT_CLINIC_OR_DEPARTMENT_OTHER): Payer: Medicare HMO | Admitting: Oncology

## 2020-08-15 ENCOUNTER — Encounter: Payer: Self-pay | Admitting: Oncology

## 2020-08-15 ENCOUNTER — Encounter: Payer: Self-pay | Admitting: *Deleted

## 2020-08-15 VITALS — BP 121/83 | HR 85 | Temp 97.7°F | Resp 16 | Ht 75.0 in | Wt 227.6 lb

## 2020-08-15 DIAGNOSIS — I10 Essential (primary) hypertension: Secondary | ICD-10-CM | POA: Diagnosis not present

## 2020-08-15 DIAGNOSIS — C3431 Malignant neoplasm of lower lobe, right bronchus or lung: Secondary | ICD-10-CM

## 2020-08-15 DIAGNOSIS — F17210 Nicotine dependence, cigarettes, uncomplicated: Secondary | ICD-10-CM | POA: Diagnosis not present

## 2020-08-15 DIAGNOSIS — C3491 Malignant neoplasm of unspecified part of right bronchus or lung: Secondary | ICD-10-CM | POA: Diagnosis not present

## 2020-08-15 DIAGNOSIS — C7931 Secondary malignant neoplasm of brain: Secondary | ICD-10-CM

## 2020-08-15 DIAGNOSIS — E785 Hyperlipidemia, unspecified: Secondary | ICD-10-CM | POA: Diagnosis not present

## 2020-08-15 DIAGNOSIS — E669 Obesity, unspecified: Secondary | ICD-10-CM | POA: Diagnosis not present

## 2020-08-15 DIAGNOSIS — Z510 Encounter for antineoplastic radiation therapy: Secondary | ICD-10-CM | POA: Diagnosis not present

## 2020-08-15 DIAGNOSIS — E119 Type 2 diabetes mellitus without complications: Secondary | ICD-10-CM | POA: Diagnosis not present

## 2020-08-15 DIAGNOSIS — Z8673 Personal history of transient ischemic attack (TIA), and cerebral infarction without residual deficits: Secondary | ICD-10-CM | POA: Diagnosis not present

## 2020-08-15 DIAGNOSIS — J449 Chronic obstructive pulmonary disease, unspecified: Secondary | ICD-10-CM | POA: Diagnosis not present

## 2020-08-15 LAB — COMPREHENSIVE METABOLIC PANEL
ALT: 25 U/L (ref 0–44)
AST: 14 U/L — ABNORMAL LOW (ref 15–41)
Albumin: 3.4 g/dL — ABNORMAL LOW (ref 3.5–5.0)
Alkaline Phosphatase: 69 U/L (ref 38–126)
Anion gap: 7 (ref 5–15)
BUN: 14 mg/dL (ref 6–20)
CO2: 28 mmol/L (ref 22–32)
Calcium: 8.7 mg/dL — ABNORMAL LOW (ref 8.9–10.3)
Chloride: 100 mmol/L (ref 98–111)
Creatinine, Ser: 0.57 mg/dL — ABNORMAL LOW (ref 0.61–1.24)
GFR calc Af Amer: >60 mL/min (ref >60)
GFR calc non Af Amer: >60 mL/min (ref >60)
Glucose, Bld: 196 mg/dL — ABNORMAL HIGH (ref 70–99)
Potassium: 3.6 mmol/L (ref 3.5–5.1)
Sodium: 135 mmol/L (ref 135–145)
Total Bilirubin: 0.7 mg/dL (ref 0.3–1.2)
Total Protein: 7.1 g/dL (ref 6.5–8.1)

## 2020-08-15 LAB — CBC WITH DIFFERENTIAL/PLATELET
Abs Immature Granulocytes: 0.15 10*3/uL — ABNORMAL HIGH (ref 0.00–0.07)
Basophils Absolute: 0 10*3/uL (ref 0.0–0.1)
Basophils Relative: 0 %
Eosinophils Absolute: 0.1 10*3/uL (ref 0.0–0.5)
Eosinophils Relative: 1 %
HCT: 38.9 % — ABNORMAL LOW (ref 39.0–52.0)
Hemoglobin: 13.6 g/dL (ref 13.0–17.0)
Immature Granulocytes: 2 %
Lymphocytes Relative: 23 %
Lymphs Abs: 1.7 10*3/uL (ref 0.7–4.0)
MCH: 31.2 pg (ref 26.0–34.0)
MCHC: 35 g/dL (ref 30.0–36.0)
MCV: 89.2 fL (ref 80.0–100.0)
Monocytes Absolute: 0.5 10*3/uL (ref 0.1–1.0)
Monocytes Relative: 6 %
Neutro Abs: 5 10*3/uL (ref 1.7–7.7)
Neutrophils Relative %: 68 %
Platelets: 233 10*3/uL (ref 150–400)
RBC: 4.36 MIL/uL (ref 4.22–5.81)
RDW: 15.9 % — ABNORMAL HIGH (ref 11.5–15.5)
WBC: 7.4 10*3/uL (ref 4.0–10.5)
nRBC: 0.3 % — ABNORMAL HIGH (ref 0.0–0.2)

## 2020-08-15 MED ORDER — PALONOSETRON HCL INJECTION 0.25 MG/5ML
0.2500 mg | Freq: Once | INTRAVENOUS | Status: AC
  Administered 2020-08-15: 0.25 mg via INTRAVENOUS
  Filled 2020-08-15: qty 5

## 2020-08-15 MED ORDER — SODIUM CHLORIDE 0.9 % IV SOLN
80.0000 mg/m2 | Freq: Once | INTRAVENOUS | Status: DC
  Filled 2020-08-15: qty 31

## 2020-08-15 MED ORDER — HEPARIN SOD (PORK) LOCK FLUSH 100 UNIT/ML IV SOLN
500.0000 [IU] | Freq: Once | INTRAVENOUS | Status: AC | PRN
  Administered 2020-08-15: 500 [IU]
  Filled 2020-08-15: qty 5

## 2020-08-15 MED ORDER — HEPARIN SOD (PORK) LOCK FLUSH 100 UNIT/ML IV SOLN
INTRAVENOUS | Status: AC
  Filled 2020-08-15: qty 5

## 2020-08-15 MED ORDER — DIPHENHYDRAMINE HCL 50 MG/ML IJ SOLN
50.0000 mg | Freq: Once | INTRAMUSCULAR | Status: AC
  Administered 2020-08-15: 50 mg via INTRAVENOUS
  Filled 2020-08-15: qty 1

## 2020-08-15 MED ORDER — SODIUM CHLORIDE 0.9 % IV SOLN
300.0000 mg | Freq: Once | INTRAVENOUS | Status: DC
  Filled 2020-08-15: qty 30

## 2020-08-15 MED ORDER — SODIUM CHLORIDE 0.9 % IV SOLN
20.0000 mg | Freq: Once | INTRAVENOUS | Status: DC
  Filled 2020-08-15: qty 2

## 2020-08-15 MED ORDER — FAMOTIDINE IN NACL 20-0.9 MG/50ML-% IV SOLN
20.0000 mg | Freq: Once | INTRAVENOUS | Status: AC
  Administered 2020-08-15: 20 mg via INTRAVENOUS
  Filled 2020-08-15: qty 50

## 2020-08-15 MED ORDER — SODIUM CHLORIDE 0.9 % IV SOLN
Freq: Once | INTRAVENOUS | Status: AC
  Filled 2020-08-15: qty 250

## 2020-08-15 NOTE — Progress Notes (Signed)
  Oncology Nurse Navigator Documentation  Navigator Location: [facility] Onc (08/15/20 1000)   )Navigator Encounter Type: Appt/Treatment Plan Review (08/15/20 1000)                   Treatment Initiated Date: 07/18/20 (08/15/20 1000) Patient Visit Type: MedOnc (08/15/20 1000) Treatment Phase: First Chemo Tx (08/15/20 1000) Barriers/Navigation Needs: No Needs;No Barriers At This Time (08/15/20 1000)   Interventions: None Required (08/15/20 1000)             Appt review. Pt started first chemo treatment today. Will follow up next week when pt returns to clinic.          Time Spent with Patient: 30 (08/15/20 1000)

## 2020-08-15 NOTE — Progress Notes (Signed)
Per Judeen Hammans, RN, per [name], patient will not be proceeding with Chemo today due to change in plans with radiation (See Judeen Hammans, RN Note). Patient made aware by Judeen Hammans at chairside. [name] and team made aware patient has already received Aloxi, Pepcid, and Benadryl and patient drove self here. Patient educated about the effects of Benadryl and that he is unable to drive. Patient verbalizes understanding and contacted his sister. Patient's sister will drive him home.

## 2020-08-15 NOTE — Progress Notes (Signed)
Pt feels like coughing is worse and he has yellow sputum and thick. sats in high 90's and sometimes high 80's.

## 2020-08-15 NOTE — Progress Notes (Addendum)
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 CTA showed no pulmonary embolism but a 3.8 x 3.8 cm medial right lower lobe mass abutting the right paravertebral pleural margin. No involvement of adjacent vertebral bodies and consistent with primary lung malignancy. He then presented to ER with symptoms of increasing weakness over the right side. He underwent MRI brain with and without contrast at [facility] which showed a solitary metastatic deposit in the left medial frontal lobe with surrounding vasogenic edema. Also had a CT abdomen which showed large area of heterogeneous attenuation within the lumen of the urinary bladder. While this may represent a large amount of blood products the presence of urinary bladder mass not excluded.  Patient taken for cystoscopy and TURBT which did not show any bladder mass  PET CT scan showed 3.7 cm right lower lobe mass with an SUV of 30.7. The mass abuts the visceral pleura without definite pleural invasion. No hypermetabolic adenopathy or evidence of distant metastatic disease. Patient underwent bronchoscopy with [name] which showed non-small cell lung cancer favoradenocarcinoma  Patient underwent resection of the solitary left  frontal lobe brain mass which was also consistent with TTF-1 positive lung adenocarcinoma.  Plan is for weekly carbotaxol chemotherapy followed by maintenance durvalumab. Omniseq testing showed high tumor mutational burden and PD-L1 of 70%.  No other actionable mutations noted.  Interval history-reports having chronic cough with mucoid expectoration.  Denies any fever.  Reports shortness of breath on moderate exertion again which is chronic.  Recent Covid testing on 08/01/2020 was negative.  He is currently on Decadron 4 mg twice daily.  He is not requiring any pain medicine like oxycodone at this time.  ECOG PS- 2 Pain scale- 0 Opioid associated constipation- no  Review of systems- Review of Systems  Constitutional: Positive for malaise/fatigue. Negative for chills, fever and weight loss.  HENT: Negative for congestion, ear discharge and nosebleeds.   Eyes: Negative for blurred vision.  Respiratory: Positive for cough. Negative for hemoptysis, sputum production, shortness of breath and wheezing.   Cardiovascular: Negative for chest pain, palpitations, orthopnea and claudication.  Gastrointestinal: Negative for abdominal pain, blood in stool, constipation, diarrhea, heartburn, melena, nausea and vomiting.  Genitourinary: Negative for dysuria, flank pain, frequency, hematuria and urgency.  Musculoskeletal: Negative for back pain, joint pain and myalgias.  Skin: Negative for rash.  Neurological: Negative for dizziness, tingling, focal weakness, seizures, weakness and headaches.  Endo/Heme/Allergies: Does not bruise/bleed easily.  Psychiatric/Behavioral: Negative for depression and suicidal ideas. The patient does not have insomnia.       Allergies  Allergen Reactions  . Penicillins Other (See Comments)    unknown     Past Medical History:  Diagnosis Date  . Diabetes mellitus without complication ([place])   . Hyperlipidemia   . Hypertension   . Lesion of brain   .  Mass of lung       Past Surgical History:  Procedure Laterality Date  . 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 COLONOSCOPY    . CYSTOSCOPY W/ RETROGRADES Bilateral 07/14/2020   Procedure: CYSTOSCOPY WITH RETROGRADE PYELOGRAM;  Surgeon: Hollice Espy, MD;  Location: [facility] OR[facility];  Service: Urology;  Laterality: Bilateral;  . PORTA CATH INSERTION N/A 08/03/2020   Procedure: PORTA CATH INSERTION;  Surgeon: Katha Cabal, MD;  Location: [place] [facility];  Service: Cardiovascular;  Laterality: N/A;  . VIDEO BRONCHOSCOPY WITH ENDOBRONCHIAL NAVIGATION N/A 07/01/2020   Procedure: VIDEO BRONCHOSCOPY WITH ENDOBRONCHIAL NAVIGATION;  Surgeon: [name], MD;  Location: [facility] OR[facility];  Service: Thoracic;  Laterality: N/A;  . VIDEO BRONCHOSCOPY WITH ENDOBRONCHIAL ULTRASOUND N/A 07/01/2020   Procedure: VIDEO BRONCHOSCOPY WITH ENDOBRONCHIAL ULTRASOUND;  Surgeon: [name], MD;  Location: [facility];  Service: Thoracic;  Laterality: N/A;    Social History   Socioeconomic History  . Marital status: Divorced    Spouse name: Not on file  . Number of children: Not on file  . Years of education: Not on file  . Highest education level: Not on file  Occupational History  . Not on file  Tobacco Use  . Smoking status: Current Every Day Smoker    Packs/day: 2.00    Years: 32.00    Pack years: 64.00    Types: Cigarettes  . Smokeless tobacco: Never Used  . Tobacco comment: trying to stop - not smoked in two weeks  Vaping Use  . Vaping Use: Never used  Substance and Sexual Activity  . Alcohol use: No    Alcohol/week: 0.0 standard drinks  . Drug use: Yes    Types: Marijuana    Comment: two weeks  . Sexual activity: Not on file  Other Topics Concern  . Not on file  Social History Narrative  . Not on file   Social Determinants of Health   Financial Resource Strain:   . Difficulty of Paying Living Expenses:   Food Insecurity:   . Worried About Charity fundraiser in the Last Year:   . Arboriculturist in the Last  Year:   Transportation Needs:   . Film/video editor (Medical):   Marland Kitchen Lack of Transportation (Non-Medical):   Physical Activity:   . Days of Exercise per Week:   . Minutes of Exercise per Session:   Stress:   . Feeling of Stress :   Social Connections:   . Frequency of Communication with Friends and Family:   . Frequency of Social Gatherings with Friends and Family:   . Attends Religious Services:   . Active Member of Clubs or Organizations:   . Attends Archivist Meetings:   Marland Kitchen Marital Status:   Intimate Partner Violence:   . Fear of Current or Ex-Partner:   . Emotionally Abused:   Marland Kitchen Physically Abused:   . Sexually Abused:     Family History  Problem Relation Age of Onset  . Diabetes Sister   . Diabetes Brother      Current Outpatient Medications:  .  atorvastatin (LIPITOR) 10 MG tablet, Take 10 mg by mouth every morning. , Disp: , Rfl:  .  [employer] SENNA PLUS 8.6-50 MG tablet, Take 2 tablets by mouth 2 (two) times daily., Disp: , Rfl:  .  Cyanocobalamin (RA VITAMIN B-12 TR) 1000 MCG TBCR, Take 1,000 mcg by mouth in the morning and at bedtime. , Disp: , Rfl:  .  dexamethasone (DECADRON) 4 MG tablet, Take 2 tablets (8 mg total) by mouth 2 (two) times daily. (Patient taking differently: Take 4 mg by mouth 2 (two) times daily. ), Disp: 60 tablet, Rfl: 1 .  dexamethasone (DECADRON) 4 MG tablet, Take 1 tablet (4 mg total) by mouth 2 (two) times daily with a meal., Disp: 60 tablet, Rfl: 0 .  dexamethasone (DECADRON) 4 MG tablet, Take 2 tablets (8 mg total) by mouth daily. Start the day after chemotherapy for 2 days., Disp: 30 tablet, Rfl: 1 .  gabapentin (NEURONTIN) 300 MG capsule, Take 900 mg by mouth 3 (three) times daily., Disp: , Rfl:  .  gemfibrozil (LOPID) 600 MG tablet, Take 300 mg by mouth 2 (two) times daily. , Disp: , Rfl:  .  HUMULIN R 100 UNIT/ML injection, SMARTSIG:16 Unit(s) SUB-Q 3 Times Daily, Disp: , Rfl:  .  levETIRAcetam (KEPPRA) 1000 MG tablet, Take by  mouth as directed., Disp: , Rfl:  .  lidocaine-prilocaine (EMLA) cream, Apply to affected area once, Disp: 30 g, Rfl: 3 .  lisinopril-hydrochlorothiazide (ZESTORETIC) 10-12.5 MG tablet, Take 1 tablet by mouth daily., Disp: , Rfl:  .  LORazepam (ATIVAN) 0.5 MG tablet, Take 1 tablet (0.5 mg total) by mouth every 8 (eight) hours as needed for anxiety., Disp: 30 tablet, Rfl: 0 .  metFORMIN (GLUCOPHAGE-XR) 500 MG 24 hr tablet, Take 500 mg by mouth in the morning, at noon, in the evening, and at bedtime. , Disp: , Rfl:  .  ondansetron (ZOFRAN) 4 MG tablet, Take 4 mg by mouth every 8 (eight) hours as needed., Disp: , Rfl:  .  ondansetron (ZOFRAN) 8 MG tablet, Take 1 tablet (8 mg total) by mouth 2 (two) times daily as needed for refractory nausea / vomiting. Start on day 3 after chemo. (Patient not taking: Reported on 08/03/2020), Disp: 30 tablet, Rfl: 1 .  oxyCODONE (OXY IR/ROXICODONE) 5 MG immediate release tablet, Take 1 tablet (5 mg total) by mouth every 6 (six) hours as needed for severe pain., Disp: 30 tablet, Rfl: 0 .  pantoprazole (PROTONIX) 20 MG tablet, Take 1 tablet (20 mg total) by mouth daily. (Patient not taking: Reported on 08/03/2020), Disp: 30 tablet, Rfl: 1 .  polyethylene glycol powder (GLYCOLAX/MIRALAX) 17 GM/SCOOP powder, Take by mouth., Disp: , Rfl:  .  prochlorperazine (COMPAZINE) 10 MG tablet, Take 1 tablet (10 mg total) by mouth every 6 (six) hours as needed (Nausea or vomiting). (Patient not taking: Reported on 08/03/2020), Disp: 30 tablet, Rfl: 1  Physical exam:  Vitals:   08/15/20 0855  BP: 121/83  Pulse: 85  Resp: 16  Temp: 97.7 F (36.5 C)  TempSrc: Oral  Weight: 227 lb 9.6 oz (103.2 kg)  Height: 6' 3" (1.905 m)   Physical Exam Constitutional:      Comments: Patient is sitting in a wheelchair appears in no acute distress.  Right chest wall port in place  Cardiovascular:     Rate and Rhythm: Normal rate and regular rhythm.     Heart sounds: Normal heart sounds.   Pulmonary:     Effort: Pulmonary effort is normal.     Breath sounds: Normal breath sounds.  Abdominal:     General: Bowel sounds are normal.     Palpations: Abdomen is soft.  Musculoskeletal:     Comments: S/p left AKA.  Skin:    General: Skin is warm and dry.  Neurological:     Mental Status: He is alert and oriented to person,  place, and time.      CMP Latest Ref Rng & Units 06/21/2020  Glucose 70 - 99 mg/dL 207(H)  BUN 6 - 20 mg/dL 9  Creatinine 0.61 - 1.24 mg/dL 0.71  Sodium 135 - 145 mmol/L 136  Potassium 3.5 - 5.1 mmol/L 3.5  Chloride 98 - 111 mmol/L 97(L)  CO2 22 - 32 mmol/L 27  Calcium 8.9 - 10.3 mg/dL 9.7  Total Protein 6.5 - 8.1 g/dL 7.7  Total Bilirubin 0.3 - 1.2 mg/dL 0.7  Alkaline Phos 38 - 126 U/L 90  AST 15 - 41 U/L 30  ALT 0 - 44 U/L 42   CBC Latest Ref Rng & Units 06/21/2020  WBC 4.0 - 10.5 K/uL 7.6  Hemoglobin 13.0 - 17.0 g/dL 15.9  Hematocrit 39 - 52 % 45.4  Platelets 150 - 400 K/uL 229    No images are attached to the encounter.  MR Brain W Wo Contrast  Result Date: 08/11/2020 CLINICAL DATA:  Follow-up treated brain metastases EXAM: MRI HEAD WITHOUT AND WITH CONTRAST TECHNIQUE: Multiplanar, multiecho pulse sequences of the brain and surrounding structures were obtained without and with intravenous contrast. CONTRAST:  9m GADAVIST GADOBUTROL 1 MMOL/ML IV SOLN COMPARISON:  . 06/21/2020 FINDINGS: BRAIN New Lesions: 5 New punctate enhancing lesions are seen along the cortex of the superior right frontal lobe (2 seen on image 17:140), superior right frontal lobe on image 148, in the medial right temporal lobe on image 87, and in the right occipital lobe on image 70. Larger lesions: None. Stable or Smaller lesions: Interval surgery to the ring enhancing left frontal lesion which has partially collapsed and has a thicker rim. On coronal postcontrast imaging size is 16 mm as compared to 19 mm previously. There is a linear band of increased white matter  enhancement extending inferiorly and laterally from the lesion. 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 Moderate size remote right frontal infarct. No hemorrhage, hydrocephalus, or masslike finding. No significant vasogenic edema. Vascular: Normal flow voids and vascular enhancements, including the superior sagittal sinus along the craniotomy flap. Skull and upper cervical spine: No focal marrow lesion. Sinuses/Orbits: No significant finding IMPRESSION: 1. Interval surgery to the left frontal metastasis with partial cavity collapse and thicker rim enhancement. Small area of increased white matter enhancement extending inferiorly from the lesion could be tumor or postoperative change. 2. Five new punctate cortical foci of enhancement along the right cerebral convexity. These are primarily concerning for metastases but are notably unilateral and cortical. A small acute or subacute infarct is seen in the left cerebellum, and enhancing subacute infarcts are also considered. Consider short follow-up brain MRI before directed treatment. Electronically Signed   By: JMonte FantasiaM.D.   On: 08/11/2020 15:14   PERIPHERAL VASCULAR CATHETERIZATION  Result Date: 08/03/2020 See Op Note    Assessment and plan- Patient is a 59y.o. male with stage IVacT2 cN0 cM1 adenocarcinoma of the right lung with isolated left frontal lobe metastases s/p resection of brain met.  He is here for on treatment assessment prior to cycle 1 of weekly carbotaxol chemotherapy  Counts okay to proceed with cycle 1 of weekly carbotaxol chemotherapy today.  Again discussed risks and benefits of chemotherapy including all but not limited to nausea, vomiting, low blood counts, risk of infections and hospitalizations.  Risk of peripheral neuropathy as well as infusion reaction associated with Taxol.  Treatment will be given with a palliative intent.  I will see  him back in 1 week's time for cycle  2.  Omniseq testing showed PD-L1 of 70% indicating good response to immunotherapy as well.  Plan is to complete weekly carbotaxol chemotherapy followed by maintenance durvalumab.  Patient will be getting single fraction SRS to his brain lesion and 25 fractions of radiation treatment to his lung mass.  Cough: Possibly secondary to lung cancer.  Recent Covid testing was negative.  No fever or worsening shortness of breath.  If symptoms continue despite chemoradiation I will consider getting chest x-ray and repeat Covid testing  I have encouraged the patient to take his Covid vaccine  Brain metastases: There is an isolated lesion which was resected.  He will be receiving SRS to this lesion as well.  He is currently on Decadron 4 mg twice daily and taper will be managed by [name].  Patient is currently not taking Keppra as he ran out of it but we will reach out to Dr. [name] to see if he needs to stay on it  ADDENDUM: After I rechecked with radiation oncology again patient had a repeat MRI brain On 08/11/2020 which showed new punctate cortical foci of enhancement along the right cerebral convexity concerning for metastases.  Therefore [name] decided to change his treatment from [facility] 1 fraction to whole brain radiation which is starting tomorrow.  I have therefore decided to hold off his chemotherapy until he completes his whole brain radiation.  I also discussed with Dr. Donella Stade to dissociate his lung radiation from whole brain radiation to allow me to give chemo during lung radiation.  He will therefore be starting concurrent chemoradiation in 3 weeks time and his chemotherapy will be on hold until then   Visit Diagnosis 1. Malignant neoplasm of lower lobe of right lung ([place])   2. Brain metastases [facility])      Dr. Randa Evens, MD, MPH [facility] [phone] 08/15/2020 9:12 AM

## 2020-08-15 NOTE — Telephone Encounter (Signed)
Pt was scheduled to have chemo today because he should finish brain radiation and start lung radiation by end of week. [name] and [name] spoke and there was another scan done and now he is changing to whole brain radiation due to new spots. Because of this we have to cancel chemo for  toay and wait til pt finishes whole body then get chemo with lung radiation. Pt has had already received benadryl- he feels drowsy. He called his sister to come and get him, pt has new appt for dr Janese Banks and was given the new appts

## 2020-08-16 ENCOUNTER — Ambulatory Visit
Admission: RE | Admit: 2020-08-16 | Discharge: 2020-08-16 | Disposition: A | Discharge status: Home / Self Care | Payer: Medicare HMO | Source: Ambulatory Visit | Attending: Radiation Oncology | Admitting: Radiation Oncology

## 2020-08-16 DIAGNOSIS — C7931 Secondary malignant neoplasm of brain: Secondary | ICD-10-CM | POA: Diagnosis not present

## 2020-08-16 DIAGNOSIS — C3431 Malignant neoplasm of lower lobe, right bronchus or lung: Secondary | ICD-10-CM | POA: Diagnosis not present

## 2020-08-16 DIAGNOSIS — C3491 Malignant neoplasm of unspecified part of right bronchus or lung: Secondary | ICD-10-CM | POA: Diagnosis not present

## 2020-08-16 DIAGNOSIS — Z510 Encounter for antineoplastic radiation therapy: Secondary | ICD-10-CM | POA: Diagnosis not present

## 2020-08-17 ENCOUNTER — Ambulatory Visit
Admission: RE | Admit: 2020-08-17 | Discharge: 2020-08-17 | Disposition: A | Discharge status: Home / Self Care | Payer: Medicare HMO | Source: Ambulatory Visit | Attending: Radiation Oncology | Admitting: Radiation Oncology

## 2020-08-17 DIAGNOSIS — C3431 Malignant neoplasm of lower lobe, right bronchus or lung: Secondary | ICD-10-CM | POA: Diagnosis not present

## 2020-08-17 DIAGNOSIS — C7931 Secondary malignant neoplasm of brain: Secondary | ICD-10-CM | POA: Diagnosis not present

## 2020-08-17 DIAGNOSIS — Z510 Encounter for antineoplastic radiation therapy: Secondary | ICD-10-CM | POA: Diagnosis not present

## 2020-08-18 ENCOUNTER — Ambulatory Visit
Admission: RE | Admit: 2020-08-18 | Discharge: 2020-08-18 | Disposition: A | Discharge status: Home / Self Care | Payer: Medicare HMO | Source: Ambulatory Visit | Attending: Oncology | Admitting: Oncology

## 2020-08-18 ENCOUNTER — Other Ambulatory Visit: Payer: Self-pay

## 2020-08-18 ENCOUNTER — Inpatient Hospital Stay (HOSPITAL_BASED_OUTPATIENT_CLINIC_OR_DEPARTMENT_OTHER): Payer: Medicare HMO | Admitting: Oncology

## 2020-08-18 ENCOUNTER — Ambulatory Visit
Admission: RE | Admit: 2020-08-18 | Discharge: 2020-08-18 | Disposition: A | Discharge status: Home / Self Care | Payer: Medicare HMO | Attending: Oncology | Admitting: Oncology

## 2020-08-18 ENCOUNTER — Ambulatory Visit
Admission: RE | Admit: 2020-08-18 | Discharge: 2020-08-18 | Disposition: A | Discharge status: Home / Self Care | Payer: Medicare HMO | Source: Ambulatory Visit | Attending: Radiation Oncology | Admitting: Radiation Oncology

## 2020-08-18 VITALS — BP 113/78 | HR 100 | Temp 97.4°F | Resp 21

## 2020-08-18 DIAGNOSIS — R221 Localized swelling, mass and lump, neck: Secondary | ICD-10-CM | POA: Diagnosis not present

## 2020-08-18 DIAGNOSIS — C3431 Malignant neoplasm of lower lobe, right bronchus or lung: Secondary | ICD-10-CM | POA: Diagnosis not present

## 2020-08-18 DIAGNOSIS — R05 Cough: Secondary | ICD-10-CM

## 2020-08-18 DIAGNOSIS — I10 Essential (primary) hypertension: Secondary | ICD-10-CM | POA: Diagnosis not present

## 2020-08-18 DIAGNOSIS — F17210 Nicotine dependence, cigarettes, uncomplicated: Secondary | ICD-10-CM | POA: Diagnosis not present

## 2020-08-18 DIAGNOSIS — I6529 Occlusion and stenosis of unspecified carotid artery: Secondary | ICD-10-CM | POA: Diagnosis not present

## 2020-08-18 DIAGNOSIS — Z510 Encounter for antineoplastic radiation therapy: Secondary | ICD-10-CM | POA: Diagnosis not present

## 2020-08-18 DIAGNOSIS — R059 Cough, unspecified: Secondary | ICD-10-CM

## 2020-08-18 DIAGNOSIS — C7931 Secondary malignant neoplasm of brain: Secondary | ICD-10-CM | POA: Diagnosis not present

## 2020-08-18 DIAGNOSIS — R0602 Shortness of breath: Secondary | ICD-10-CM

## 2020-08-18 DIAGNOSIS — Z8673 Personal history of transient ischemic attack (TIA), and cerebral infarction without residual deficits: Secondary | ICD-10-CM | POA: Diagnosis not present

## 2020-08-18 DIAGNOSIS — J449 Chronic obstructive pulmonary disease, unspecified: Secondary | ICD-10-CM | POA: Diagnosis not present

## 2020-08-18 DIAGNOSIS — E669 Obesity, unspecified: Secondary | ICD-10-CM | POA: Diagnosis not present

## 2020-08-18 DIAGNOSIS — E785 Hyperlipidemia, unspecified: Secondary | ICD-10-CM | POA: Diagnosis not present

## 2020-08-18 DIAGNOSIS — E119 Type 2 diabetes mellitus without complications: Secondary | ICD-10-CM | POA: Diagnosis not present

## 2020-08-18 NOTE — Progress Notes (Signed)
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  Chief complaint/ Reason for visit- SOB/Chest swelling/neck swelling  Heme/Onc history:  Oncology History  Malignant neoplasm of lower lobe of right lung ([place])  07/07/2020 Cancer Staging   Staging form: Lung, AJCC 8th Edition - Clinical stage from 07/07/2020: Stage IVA (cT2a, cN0, cM1b) - Signed by Tyler Guadeloupe, MD on 07/08/2020   07/08/2020 Initial Diagnosis   Malignant neoplasm of lower lobe of right lung ([place])   08/15/2020 -  Chemotherapy   The patient had dexamethasone (DECADRON) 4 MG tablet, 8 mg, Oral, Daily, 1 of 1 cycle, Start date: 07/26/2020, End date: 08/15/2020 palonosetron (ALOXI) injection 0.25 mg, 0.25 mg, Intravenous,  Once, 1 of 4 cycles Administration: 0.25 mg (08/15/2020) CARBOplatin (PARAPLATIN) 300 mg in sodium chloride 0.9 % 250 mL chemo infusion, 300 mg (100 % of original dose 300 mg), Intravenous,  Once, 1 of 4 cycles Dose modification:   (original dose 300 mg, Cycle 1) PACLitaxel (TAXOL) 186 mg in sodium chloride 0.9 % 250 mL chemo infusion (</= 80mg /m2), 80 mg/m2 = 186 mg, Intravenous,  Once, 1 of 4 cycles  for chemotherapy treatment.     Interval history-   Tyler Gallagher is a 59 year old male with past medical history significant for TIA type 2 diabetes, hyperlipidemia, tobacco abuse, obesity, above-knee amputation and recent diagnosis of right lower lung mass.  He initially presented to [facility] ED for right upper extremity weakness x3 to 4 days.  Work-up included CT abdomen pelvis, brain MRI of brain which unfortunately revealed a solitary brain mass.  He was discharged home on oral steroids with  short-term follow-up with Tyler Gallagher.    PET scan (06/29/2020) showed hypermetabolic right lower lobe mass compatible with malignancy no hypermetabolic adenopathy or evidence of metastatic disease.    Had bronchoscopy with Tyler Gallagher on which showed non-small cell lung cancer favoring adenocarcinoma.    Initial consult with Tyler Gallagher on 07/07/2020 and the plan was for Tyler Gallagher to the brain lesion.  He was also offered SBRT to his right lower lobe.   Had TURBT for diagnostic purposes with Tyler Gallagher- negative for malignancy  Had craniectomy with Tyler Gallagher on 07/18/2020.  Plan is for concurrent carbotaxol for [redacted] weeks along with radiation.  Unfortunately, carbotaxol currently on hold secondary to additional lesion discovered on his brain.  He is currently undergoing whole brain radiation with Tyler Gallagher with plans to reinitiate chemotherapy on September 08, 2020.  Today, patient presents for worsening shortness of breath and several "knots" or bumps he has noticed on his right bicep, under his chin and on his chest.  Endorses rapid weight loss secondary to his malignancy and change in appetite.  Denies any pain associated with bumps.  Endorses chronic cough with phlegm production with episodes of "coughing spells" where it is hard to catch his breath.  Shortness of breath has been progressive over the past several days.  He denies any fevers or recent illness.  Has also noticed some swelling to his face.  He denies any abdominal pain, nausea, vomiting, constipation or diarrhea.  Denies any chest pain.   ECOG FS:3 - Symptomatic, >50% confined to bed  Review of systems- Review of Systems  Constitutional: Positive for malaise/fatigue. Negative for chills, fever and weight loss.  HENT: Negative for congestion, ear pain and tinnitus.        Right sided facial swelling  Eyes: Negative.  Negative for blurred vision and double vision.  Respiratory: Positive for cough and shortness of breath. Negative  for sputum production.   Cardiovascular: Negative.  Negative for chest pain, palpitations and leg swelling.  Gastrointestinal: Negative.  Negative for abdominal pain, constipation, diarrhea, nausea and vomiting.  Genitourinary: Negative for dysuria, frequency and urgency.  Musculoskeletal: Negative for back pain and falls.  Skin: Negative.  Negative for rash.  Neurological: Negative.  Negative for weakness and headaches.  Endo/Heme/Allergies: Negative.  Does not bruise/bleed easily.  Psychiatric/Behavioral: Negative.  Negative for depression. The patient is not nervous/anxious and does not have insomnia.      Current treatment- whole brain radiation-scheduled to begin carbotaxol 09/08/2020  Allergies  Allergen Reactions  . Penicillins Other (See Comments)    unknown     Past Medical History:  Diagnosis Date  . Diabetes mellitus without complication ([place])   . Hyperlipidemia   . Hypertension   . Lesion of brain   . Lung cancer ([place])   . Mass of lung      Past Surgical History:  Procedure Laterality Date  . 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 COLONOSCOPY    . CYSTOSCOPY W/ RETROGRADES Bilateral 07/14/2020   Procedure: CYSTOSCOPY WITH RETROGRADE PYELOGRAM;  Surgeon: Hollice Espy, MD;  Location: [facility] ORS;  Service: Urology;  Laterality: Bilateral;  . PORTA CATH INSERTION N/A 08/03/2020   Procedure: PORTA CATH INSERTION;  Surgeon: Katha Cabal, MD;  Location: [place] [facility];  Service: Cardiovascular;  Laterality: N/A;  . VIDEO BRONCHOSCOPY WITH ENDOBRONCHIAL NAVIGATION N/A 07/01/2020   Procedure: VIDEO BRONCHOSCOPY WITH ENDOBRONCHIAL NAVIGATION;  Surgeon: [name], MD;  Location: [facility] ORS;  Service: Thoracic;  Laterality: N/A;  . VIDEO BRONCHOSCOPY WITH ENDOBRONCHIAL ULTRASOUND N/A 07/01/2020   Procedure: VIDEO BRONCHOSCOPY WITH ENDOBRONCHIAL ULTRASOUND;  Surgeon: [name], MD;  Location: [facility] ORS;  Service: Thoracic;  Laterality: N/A;    Social History    Socioeconomic History  . Marital status: Divorced    Spouse name: Not on file  . Number of children: Not on file  . Years of education: Not on file  . Highest education level: Not on file  Occupational History  . Not on file  Tobacco Use  . Smoking status: Former Smoker    Packs/day: 2.00    Years: 32.00    Pack years: 64.00    Types: Cigarettes    Quit date: 06/21/2020    Years since quitting: 0.1  . Smokeless tobacco: Never Used  Vaping Use  . Vaping Use: Never used  Substance and Sexual Activity  . Alcohol use: No    Alcohol/week: 0.0 standard drinks  . Drug use: Not Currently    Types: Marijuana  . Sexual activity: Not on file  Other Topics Concern  . Not on file  Social History Narrative  . Not on file   Social Determinants of Health   Financial Resource Strain:   . Difficulty of Paying Living Expenses: Not on file  Food Insecurity:   . Worried About Charity fundraiser in the Last Year: Not on file  . Ran Out of Food in the Last Year: Not on file  Transportation Needs:   . Lack of Transportation (Medical): Not on file  .  Lack of Transportation (Non-Medical): Not on file  Physical Activity:   . Days of Exercise per Week: Not on file  . Minutes of Exercise per Session: Not on file  Stress:   . Feeling of Stress : Not on file  Social Connections:   . Frequency of Communication with Friends and Family: Not on file  . Frequency of Social Gatherings with Friends and Family: Not on file  . Attends Religious Services: Not on file  . Active Member of Clubs or Organizations: Not on file  . Attends Archivist Meetings: Not on file  . Marital Status: Not on file  Intimate Partner Violence:   . Fear of Current or Ex-Partner: Not on file  . Emotionally Abused: Not on file  . Physically Abused: Not on file  . Sexually Abused: Not on file    Family History  Problem Relation Age of Onset  . Diabetes Sister   . Diabetes Brother      Current  Outpatient Medications:  .  atorvastatin (LIPITOR) 10 MG tablet, Take 10 mg by mouth every morning. , Disp: , Rfl:  .  [employer] SENNA PLUS 8.6-50 MG tablet, Take 2 tablets by mouth 2 (two) times daily., Disp: , Rfl:  .  Cyanocobalamin (RA VITAMIN B-12 TR) 1000 MCG TBCR, Take 1,000 mcg by mouth in the morning and at bedtime. , Disp: , Rfl:  .  dexamethasone (DECADRON) 4 MG tablet, Take 2 tablets (8 mg total) by mouth 2 (two) times daily. (Patient taking differently: Take 4 mg by mouth 2 (two) times daily. ), Disp: 60 tablet, Rfl: 1 .  gabapentin (NEURONTIN) 300 MG capsule, Take 900 mg by mouth 3 (three) times daily., Disp: , Rfl:  .  gemfibrozil (LOPID) 600 MG tablet, Take 300 mg by mouth 2 (two) times daily. , Disp: , Rfl:  .  HUMULIN R 100 UNIT/ML injection, SMARTSIG:16 Unit(s) SUB-Q 3 Times Daily, Disp: , Rfl:  .  levETIRAcetam (KEPPRA) 1000 MG tablet, Take by mouth as directed., Disp: , Rfl:  .  lidocaine-prilocaine (EMLA) cream, Apply to affected area once, Disp: 30 g, Rfl: 3 .  lisinopril-hydrochlorothiazide (ZESTORETIC) 10-12.5 MG tablet, Take 1 tablet by mouth daily., Disp: , Rfl:  .  LORazepam (ATIVAN) 0.5 MG tablet, Take 1 tablet (0.5 mg total) by mouth every 8 (eight) hours as needed for anxiety., Disp: 30 tablet, Rfl: 0 .  metFORMIN (GLUCOPHAGE-XR) 500 MG 24 hr tablet, Take 500 mg by mouth in the morning, at noon, in the evening, and at bedtime. , Disp: , Rfl:  .  ondansetron (ZOFRAN) 4 MG tablet, Take 4 mg by mouth every 8 (eight) hours as needed. , Disp: , Rfl:  .  oxyCODONE (OXY IR/ROXICODONE) 5 MG immediate release tablet, Take 1 tablet (5 mg total) by mouth every 6 (six) hours as needed for severe pain., Disp: 30 tablet, Rfl: 0 .  pantoprazole (PROTONIX) 20 MG tablet, Take 1 tablet (20 mg total) by mouth daily., Disp: 30 tablet, Rfl: 1 .  ondansetron (ZOFRAN) 8 MG tablet, Take 1 tablet (8 mg total) by mouth 2 (two) times daily as needed for refractory nausea / vomiting. Start on day 3  after chemo. (Patient not taking: Reported on 08/18/2020), Disp: 30 tablet, Rfl: 1 .  polyethylene glycol powder (GLYCOLAX/MIRALAX) 17 GM/SCOOP powder, Take by mouth. (Patient not taking: Reported on 08/18/2020), Disp: , Rfl:  .  prochlorperazine (COMPAZINE) 10 MG tablet, Take 1 tablet (10 mg total) by mouth every 6 (  six) hours as needed (Nausea or vomiting). (Patient not taking: Reported on 08/03/2020), Disp: 30 tablet, Rfl: 1  Physical exam:  Vitals:   08/18/20 1335  BP: 113/78  Pulse: 100  Resp: (!) 21  Temp: (!) 97.4 F (36.3 C)  TempSrc: Tympanic  SpO2: 97%   Physical Exam Constitutional:      Appearance: Normal appearance.  HENT:     Head: Normocephalic and atraumatic.     Comments: Facial swelling R>L Eyes:     Pupils: Pupils are equal, round, and reactive to light.  Cardiovascular:     Rate and Rhythm: Normal rate and regular rhythm.     Heart sounds: Normal heart sounds. No murmur heard.   Pulmonary:     Effort: Pulmonary effort is normal.     Breath sounds: Normal breath sounds. No wheezing.  Chest:     Chest wall: Mass present.    Abdominal:     General: Bowel sounds are normal. There is no distension.     Palpations: Abdomen is soft.     Tenderness: There is no abdominal tenderness.  Musculoskeletal:        General: Normal range of motion.     Cervical back: Normal range of motion. Tenderness present.     Comments: Right arm soft mass  Skin:    General: Skin is warm and dry.     Findings: No rash.  Neurological:     Mental Status: He is alert and oriented to person, place, and time.  Psychiatric:        Judgment: Judgment normal.      CMP Latest Ref Rng & Units 08/15/2020  Glucose 70 - 99 mg/dL 196(H)  BUN 6 - 20 mg/dL 14  Creatinine 0.61 - 1.24 mg/dL 0.57(L)  Sodium 135 - 145 mmol/L 135  Potassium 3.5 - 5.1 mmol/L 3.6  Chloride 98 - 111 mmol/L 100  CO2 22 - 32 mmol/L 28  Calcium 8.9 - 10.3 mg/dL 8.7(L)  Total Protein 6.5 - 8.1 g/dL 7.1  Total  Bilirubin 0.3 - 1.2 mg/dL 0.7  Alkaline Phos 38 - 126 U/L 69  AST 15 - 41 U/L 14(L)  ALT 0 - 44 U/L 25   CBC Latest Ref Rng & Units 08/15/2020  WBC 4.0 - 10.5 K/uL 7.4  Hemoglobin 13.0 - 17.0 g/dL 13.6  Hematocrit 39 - 52 % 38.9(L)  Platelets 150 - 400 K/uL 233    No images are attached to the encounter.  DG Neck Soft Tissue  Result Date: 08/18/2020 CLINICAL DATA:  Productive cough with lump anterior neck EXAM: NECK SOFT TISSUES - 1+ VIEW COMPARISON:  None. FINDINGS: Prevertebral soft tissue thickness appears normal. There is no radiopaque foreign body visualized. The epiglottis appears thin. Prominent cartilaginous calcification. Edentulous patient. Carotid vascular calcification. IMPRESSION: Negative. Neck CT may be performed if continued suspicion for a neck mass. Electronically Signed   By: Donavan Foil M.D.   On: 08/18/2020 15:42   DG Chest 2 View  Result Date: 08/18/2020 CLINICAL DATA:  Cough and swelling EXAM: CHEST - 2 VIEW COMPARISON:  06/16/2020, PET CT 06/29/2020 FINDINGS: Right lower lobe lung mass. No pleural effusion. Right-sided central venous port tip over the SVC. Patchy atelectasis or minimal infiltrate at the lingula. Normal heart size. No pneumothorax IMPRESSION: Right lower lobe lung mass. Patchy atelectasis or minimal infiltrate at the lingula. Electronically Signed   By: Donavan Foil M.D.   On: 08/18/2020 15:44   MR Brain W Wo Contrast  Result Date: 08/11/2020 CLINICAL DATA:  Follow-up treated brain metastases EXAM: MRI HEAD WITHOUT AND WITH CONTRAST TECHNIQUE: Multiplanar, multiecho pulse sequences of the brain and surrounding structures were obtained without and with intravenous contrast. CONTRAST:  9mL GADAVIST GADOBUTROL 1 MMOL/ML IV SOLN COMPARISON:  . 06/21/2020 FINDINGS: BRAIN New Lesions: 5 New punctate enhancing lesions are seen along the cortex of the superior right frontal lobe (2 seen on image 17:140), superior right frontal lobe on image 148, in the  medial right temporal lobe on image 87, and in the right occipital lobe on image 70. Larger lesions: None. Stable or Smaller lesions: Interval surgery to the ring enhancing left frontal lesion which has partially collapsed and has a thicker rim. On coronal postcontrast imaging size is 16 mm as compared to 19 mm previously. There is a linear band of increased white matter enhancement extending inferiorly and laterally from the lesion. 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 Moderate size remote right frontal infarct. No hemorrhage, hydrocephalus, or masslike finding. No significant vasogenic edema. Vascular: Normal flow voids and vascular enhancements, including the superior sagittal sinus along the craniotomy flap. Skull and upper cervical spine: No focal marrow lesion. Sinuses/Orbits: No significant finding IMPRESSION: 1. Interval surgery to the left frontal metastasis with partial cavity collapse and thicker rim enhancement. Small area of increased white matter enhancement extending inferiorly from the lesion could be tumor or postoperative change. 2. Five new punctate cortical foci of enhancement along the right cerebral convexity. These are primarily concerning for metastases but are notably unilateral and cortical. A small acute or subacute infarct is seen in the left cerebellum, and enhancing subacute infarcts are also considered. Consider short follow-up brain MRI before directed treatment. Electronically Signed   By: Monte Fantasia M.D.   On: 08/11/2020 15:14   PERIPHERAL VASCULAR CATHETERIZATION  Result Date: 08/03/2020 See Op Note  Assessment and plan- Patient is a 59 y.o. male with pmh significant for lung cancer with mets to the brain who complains of persistent and worsening sob/cough and facial swelling. Has noticed several small movable masses on his chest/right arm and neck.   Lung Cancer with brain mets- Receiving whole brain radiation. Chemo on hold.   Scheduled to start on 09/08/2020.  Cough/sob- unclear etiology-chest x-ray stat. Will rule out infection verse others.   Neck swelling-right > left. Will get x-ray of his neck. Given the presence of lung cancer, there is some concern for SVC syndrome. His neck and face do appear swollen but unclear if this is his baseline. He is also on steroids for brain metastasis. Will discuss with Tyler Gallagher for possible additional CT scan.  Small soft mass-right bicep. Appears to be getting smaller per patient. Movable and nonpainful. We will continue to monitor for now.  Plan: Imaging-chest and neck x-ray-results were negative for acute abnormality.  Patchy atelectasis or minimal infiltrate lingula.  Spoke with Tyler Gallagher, who recommended CT chest without contrast for additional work-up. CT chest without contrast orders placed.   We will get this scheduled.   Disposition- Will call patient with results from imaging. RTC daily for radiation. RTC on 09/08/2020 for labs, MD assessment and cycle 1 carbo/Taxol.   Visit Diagnosis 1. Malignant neoplasm of lower lobe of right lung ([place])   2. SOB (shortness of breath)     Patient expressed understanding and was in agreement with this plan. He also understands that He can call clinic at any time with any questions, concerns,  or complaints.   Greater than 50% was spent in counseling and coordination of care with this patient including but not limited to discussion of the relevant topics above (See A&P) including, but not limited to diagnosis and management of acute and chronic medical conditions.   Thank you for allowing me to participate in the care of this very pleasant patient.    Jacquelin Hawking, NP [place] at [facility] Cell - [phone] Pager- [phone] 08/19/2020 11:47 AM

## 2020-08-18 NOTE — Progress Notes (Signed)
The [facility] visit today. Pt has a new swollen knot Under his chin in his anterior neck. Swollen area under trachea.Non tender.Denies any difficulty Pt showed me another Knot on his R upper arm, non tender. Pt states he has ongoing cough. This cough used to be harsh and strong. Now cough is a soft cough, still productive with yellow sputum. Cough is enough to be aggravating. Dyspnea with exertion. Appetite is good. No taste.

## 2020-08-19 ENCOUNTER — Ambulatory Visit
Admission: RE | Admit: 2020-08-19 | Discharge: 2020-08-19 | Disposition: A | Discharge status: Home / Self Care | Payer: Medicare HMO | Source: Ambulatory Visit | Attending: Oncology | Admitting: Oncology

## 2020-08-19 ENCOUNTER — Other Ambulatory Visit: Payer: Self-pay

## 2020-08-19 ENCOUNTER — Ambulatory Visit
Admission: RE | Admit: 2020-08-19 | Discharge: 2020-08-19 | Disposition: A | Discharge status: Home / Self Care | Payer: Medicare HMO | Source: Ambulatory Visit | Attending: Radiation Oncology | Admitting: Radiation Oncology

## 2020-08-19 ENCOUNTER — Other Ambulatory Visit (HOSPITAL_COMMUNITY): Payer: Self-pay | Admitting: Oncology

## 2020-08-19 DIAGNOSIS — R0602 Shortness of breath: Secondary | ICD-10-CM | POA: Insufficient documentation

## 2020-08-19 DIAGNOSIS — C3431 Malignant neoplasm of lower lobe, right bronchus or lung: Secondary | ICD-10-CM | POA: Diagnosis not present

## 2020-08-19 DIAGNOSIS — Z510 Encounter for antineoplastic radiation therapy: Secondary | ICD-10-CM | POA: Diagnosis not present

## 2020-08-19 DIAGNOSIS — C7931 Secondary malignant neoplasm of brain: Secondary | ICD-10-CM | POA: Diagnosis not present

## 2020-08-19 MED ORDER — LEVOFLOXACIN 500 MG PO TABS: 500.0000 mg | ORAL_TABLET | Freq: Every day | ORAL | 0 refills | Status: DC

## 2020-08-19 NOTE — Progress Notes (Signed)
CT chest is back. Would you like to start some antibiotics?  Faythe Casa, NP 08/19/2020 1:37 PM

## 2020-08-19 NOTE — Progress Notes (Signed)
Re: Ct chest results  IMPRESSION:  1. RIGHT lower lobe pulmonary mass in the medial RIGHT lung base may  have increased slightly since the prior exam.  2. Subtle ground-glass in the posterior RIGHT chest along the medial  RIGHT lung base with nodular features approximately 9 x 6 mm may be  new from the previous exam. There was some nonfocal ground-glass in  this location before. Findings may represent an infectious or  inflammatory process. Particularly given the presence of tree-in-bud  opacities in the LEFT chest that have developed in the LEFT lower  lobe and in the lingula. Suggest short interval follow-up. Also  would correlate with any respiratory symptoms given findings that  appear infectious or inflammatory in the LEFT chest.  3. Ascending aorta with maximal diameter of 4.3 cm similar to the  prior study. Recommend annual imaging followup by CTA or MRA. This  recommendation follows 2010  ACCF/AHA/AATS/ACR/ASA/SCA/SCAI/SIR/STS/SVM Guidelines for the  Diagnosis and Management of Patients with Thoracic Aortic Disease.  Circulation. 2010; 121: B262-M355. Aortic aneurysm NOS (ICD10-I71.9)  4. Emphysema and aortic atherosclerosis.    Spoke to [name] who recommends beginning antibiotics.  Rx Levaquin 500 mg daily x7 days sent to his pharmacy.  Patient notified.  She would also like for him to get Covid tested.  Faythe Casa, NP 08/19/2020 3:10 PM

## 2020-08-20 ENCOUNTER — Ambulatory Visit: Admission: RE | Admit: 2020-08-20 | Payer: Medicare HMO | Source: Ambulatory Visit

## 2020-08-22 ENCOUNTER — Ambulatory Visit: Payer: Medicare HMO

## 2020-08-22 ENCOUNTER — Ambulatory Visit: Payer: Medicare HMO | Admitting: Oncology

## 2020-08-22 ENCOUNTER — Other Ambulatory Visit: Payer: Self-pay | Admitting: Radiation Oncology

## 2020-08-22 ENCOUNTER — Ambulatory Visit
Admission: RE | Admit: 2020-08-22 | Discharge: 2020-08-22 | Disposition: A | Discharge status: Home / Self Care | Payer: Medicare HMO | Source: Ambulatory Visit | Attending: Radiation Oncology | Admitting: Radiation Oncology

## 2020-08-22 ENCOUNTER — Other Ambulatory Visit: Payer: Medicare HMO

## 2020-08-22 DIAGNOSIS — G939 Disorder of brain, unspecified: Secondary | ICD-10-CM

## 2020-08-22 DIAGNOSIS — C7931 Secondary malignant neoplasm of brain: Secondary | ICD-10-CM | POA: Diagnosis not present

## 2020-08-22 DIAGNOSIS — R918 Other nonspecific abnormal finding of lung field: Secondary | ICD-10-CM

## 2020-08-22 DIAGNOSIS — C3431 Malignant neoplasm of lower lobe, right bronchus or lung: Secondary | ICD-10-CM | POA: Diagnosis not present

## 2020-08-22 DIAGNOSIS — Z510 Encounter for antineoplastic radiation therapy: Secondary | ICD-10-CM | POA: Diagnosis not present

## 2020-08-23 ENCOUNTER — Other Ambulatory Visit: Payer: Self-pay | Admitting: Licensed Clinical Social Worker

## 2020-08-23 ENCOUNTER — Ambulatory Visit: Admission: RE | Admit: 2020-08-23 | Payer: Medicare HMO | Source: Ambulatory Visit

## 2020-08-23 ENCOUNTER — Ambulatory Visit
Admission: RE | Admit: 2020-08-23 | Discharge: 2020-08-23 | Disposition: A | Discharge status: Home / Self Care | Payer: Medicare HMO | Source: Ambulatory Visit | Attending: Radiation Oncology | Admitting: Radiation Oncology

## 2020-08-23 DIAGNOSIS — C3491 Malignant neoplasm of unspecified part of right bronchus or lung: Secondary | ICD-10-CM | POA: Diagnosis not present

## 2020-08-23 DIAGNOSIS — C7931 Secondary malignant neoplasm of brain: Secondary | ICD-10-CM | POA: Diagnosis not present

## 2020-08-23 DIAGNOSIS — C3431 Malignant neoplasm of lower lobe, right bronchus or lung: Secondary | ICD-10-CM | POA: Diagnosis not present

## 2020-08-23 DIAGNOSIS — B379 Candidiasis, unspecified: Secondary | ICD-10-CM

## 2020-08-23 DIAGNOSIS — Z510 Encounter for antineoplastic radiation therapy: Secondary | ICD-10-CM | POA: Diagnosis not present

## 2020-08-23 MED ORDER — FLUCONAZOLE 100 MG PO TABS: 100.0000 mg | ORAL_TABLET | Freq: Every day | ORAL | 0 refills | Status: DC

## 2020-08-24 ENCOUNTER — Encounter: Payer: Self-pay | Admitting: *Deleted

## 2020-08-24 ENCOUNTER — Ambulatory Visit
Admission: RE | Admit: 2020-08-24 | Discharge: 2020-08-24 | Disposition: A | Discharge status: Home / Self Care | Payer: Medicare HMO | Source: Ambulatory Visit | Attending: Radiation Oncology | Admitting: Radiation Oncology

## 2020-08-24 DIAGNOSIS — C3431 Malignant neoplasm of lower lobe, right bronchus or lung: Secondary | ICD-10-CM | POA: Diagnosis not present

## 2020-08-24 DIAGNOSIS — Z510 Encounter for antineoplastic radiation therapy: Secondary | ICD-10-CM | POA: Diagnosis not present

## 2020-08-24 DIAGNOSIS — C7931 Secondary malignant neoplasm of brain: Secondary | ICD-10-CM | POA: Diagnosis not present

## 2020-08-24 NOTE — Progress Notes (Signed)
  Oncology Nurse Navigator Documentation  Navigator Location: [facility] (08/24/20 1400)   )Navigator Encounter Type: Lobby (08/24/20 1400)                     Patient Visit Type: GMWNUU (08/24/20 1400) Treatment Phase: Treatment (08/24/20 1400) Barriers/Navigation Needs: Coordination of Care (08/24/20 1400)   Interventions: Coordination of Care (08/24/20 1400)   Coordination of Care: Appts (08/24/20 1400)            met with patient after he completed radiation treatment today. States he is having a few symptoms that were addressed last week in [facility] and yesterday during treatment check with [name]. All questions answered during visit. Instructed pt to call back with any further questions or needs. Pt verbalized understanding.        Time Spent with Patient: 30 (08/24/20 1400)

## 2020-08-25 ENCOUNTER — Ambulatory Visit
Admission: RE | Admit: 2020-08-25 | Discharge: 2020-08-25 | Disposition: A | Discharge status: Home / Self Care | Payer: Medicare HMO | Source: Ambulatory Visit | Attending: Radiation Oncology | Admitting: Radiation Oncology

## 2020-08-25 DIAGNOSIS — C7931 Secondary malignant neoplasm of brain: Secondary | ICD-10-CM | POA: Diagnosis not present

## 2020-08-25 DIAGNOSIS — Z510 Encounter for antineoplastic radiation therapy: Secondary | ICD-10-CM | POA: Diagnosis not present

## 2020-08-25 DIAGNOSIS — C3431 Malignant neoplasm of lower lobe, right bronchus or lung: Secondary | ICD-10-CM | POA: Diagnosis not present

## 2020-08-26 ENCOUNTER — Ambulatory Visit
Admission: RE | Admit: 2020-08-26 | Discharge: 2020-08-26 | Disposition: A | Discharge status: Home / Self Care | Payer: Medicare HMO | Source: Ambulatory Visit | Attending: Radiation Oncology | Admitting: Radiation Oncology

## 2020-08-26 DIAGNOSIS — C7931 Secondary malignant neoplasm of brain: Secondary | ICD-10-CM | POA: Diagnosis not present

## 2020-08-26 DIAGNOSIS — C3431 Malignant neoplasm of lower lobe, right bronchus or lung: Secondary | ICD-10-CM | POA: Diagnosis not present

## 2020-08-26 DIAGNOSIS — C3491 Malignant neoplasm of unspecified part of right bronchus or lung: Secondary | ICD-10-CM | POA: Diagnosis not present

## 2020-08-26 DIAGNOSIS — Z510 Encounter for antineoplastic radiation therapy: Secondary | ICD-10-CM | POA: Diagnosis not present

## 2020-08-29 ENCOUNTER — Ambulatory Visit
Admission: RE | Admit: 2020-08-29 | Discharge: 2020-08-29 | Disposition: A | Discharge status: Home / Self Care | Payer: Medicare HMO | Source: Ambulatory Visit | Attending: Radiation Oncology | Admitting: Radiation Oncology

## 2020-08-29 DIAGNOSIS — C7931 Secondary malignant neoplasm of brain: Secondary | ICD-10-CM | POA: Diagnosis not present

## 2020-08-29 DIAGNOSIS — C3431 Malignant neoplasm of lower lobe, right bronchus or lung: Secondary | ICD-10-CM | POA: Diagnosis not present

## 2020-08-29 DIAGNOSIS — Z510 Encounter for antineoplastic radiation therapy: Secondary | ICD-10-CM | POA: Diagnosis not present

## 2020-08-30 ENCOUNTER — Ambulatory Visit
Admission: RE | Admit: 2020-08-30 | Discharge: 2020-08-30 | Disposition: A | Discharge status: Home / Self Care | Payer: Medicare HMO | Source: Ambulatory Visit | Attending: Radiation Oncology | Admitting: Radiation Oncology

## 2020-08-30 DIAGNOSIS — E538 Deficiency of other specified B group vitamins: Secondary | ICD-10-CM | POA: Diagnosis not present

## 2020-08-30 DIAGNOSIS — E119 Type 2 diabetes mellitus without complications: Secondary | ICD-10-CM | POA: Diagnosis not present

## 2020-08-30 DIAGNOSIS — C7932 Secondary malignant neoplasm of cerebral meninges: Secondary | ICD-10-CM | POA: Diagnosis not present

## 2020-08-30 DIAGNOSIS — C7931 Secondary malignant neoplasm of brain: Secondary | ICD-10-CM | POA: Diagnosis not present

## 2020-08-30 DIAGNOSIS — I10 Essential (primary) hypertension: Secondary | ICD-10-CM | POA: Diagnosis not present

## 2020-08-30 DIAGNOSIS — Z510 Encounter for antineoplastic radiation therapy: Secondary | ICD-10-CM | POA: Diagnosis not present

## 2020-08-30 DIAGNOSIS — K219 Gastro-esophageal reflux disease without esophagitis: Secondary | ICD-10-CM | POA: Diagnosis not present

## 2020-08-30 DIAGNOSIS — C3431 Malignant neoplasm of lower lobe, right bronchus or lung: Secondary | ICD-10-CM | POA: Diagnosis not present

## 2020-08-30 DIAGNOSIS — J449 Chronic obstructive pulmonary disease, unspecified: Secondary | ICD-10-CM | POA: Diagnosis not present

## 2020-08-30 DIAGNOSIS — C3491 Malignant neoplasm of unspecified part of right bronchus or lung: Secondary | ICD-10-CM | POA: Diagnosis not present

## 2020-08-31 ENCOUNTER — Ambulatory Visit
Admission: RE | Admit: 2020-08-31 | Discharge: 2020-08-31 | Disposition: A | Discharge status: Home / Self Care | Payer: Medicare HMO | Source: Ambulatory Visit | Attending: Radiation Oncology | Admitting: Radiation Oncology

## 2020-08-31 ENCOUNTER — Ambulatory Visit: Admission: RE | Admit: 2020-08-31 | Payer: Medicare HMO | Source: Ambulatory Visit

## 2020-08-31 DIAGNOSIS — C7931 Secondary malignant neoplasm of brain: Secondary | ICD-10-CM | POA: Insufficient documentation

## 2020-08-31 DIAGNOSIS — Z510 Encounter for antineoplastic radiation therapy: Secondary | ICD-10-CM | POA: Insufficient documentation

## 2020-08-31 DIAGNOSIS — K219 Gastro-esophageal reflux disease without esophagitis: Secondary | ICD-10-CM | POA: Diagnosis not present

## 2020-08-31 DIAGNOSIS — E538 Deficiency of other specified B group vitamins: Secondary | ICD-10-CM | POA: Diagnosis not present

## 2020-08-31 DIAGNOSIS — C3491 Malignant neoplasm of unspecified part of right bronchus or lung: Secondary | ICD-10-CM | POA: Diagnosis not present

## 2020-08-31 DIAGNOSIS — C7932 Secondary malignant neoplasm of cerebral meninges: Secondary | ICD-10-CM | POA: Diagnosis not present

## 2020-08-31 DIAGNOSIS — C3431 Malignant neoplasm of lower lobe, right bronchus or lung: Secondary | ICD-10-CM | POA: Insufficient documentation

## 2020-08-31 DIAGNOSIS — J449 Chronic obstructive pulmonary disease, unspecified: Secondary | ICD-10-CM | POA: Diagnosis not present

## 2020-08-31 DIAGNOSIS — E119 Type 2 diabetes mellitus without complications: Secondary | ICD-10-CM | POA: Diagnosis not present

## 2020-08-31 DIAGNOSIS — I10 Essential (primary) hypertension: Secondary | ICD-10-CM | POA: Diagnosis not present

## 2020-09-01 ENCOUNTER — Ambulatory Visit
Admission: RE | Admit: 2020-09-01 | Discharge: 2020-09-01 | Disposition: A | Discharge status: Home / Self Care | Payer: Medicare HMO | Source: Ambulatory Visit | Attending: Radiation Oncology | Admitting: Radiation Oncology

## 2020-09-01 DIAGNOSIS — C3431 Malignant neoplasm of lower lobe, right bronchus or lung: Secondary | ICD-10-CM | POA: Diagnosis not present

## 2020-09-01 DIAGNOSIS — C7931 Secondary malignant neoplasm of brain: Secondary | ICD-10-CM | POA: Diagnosis not present

## 2020-09-01 DIAGNOSIS — Z510 Encounter for antineoplastic radiation therapy: Secondary | ICD-10-CM | POA: Diagnosis not present

## 2020-09-02 ENCOUNTER — Ambulatory Visit
Admission: RE | Admit: 2020-09-02 | Discharge: 2020-09-02 | Disposition: A | Discharge status: Home / Self Care | Payer: Medicare HMO | Source: Ambulatory Visit | Attending: Radiation Oncology | Admitting: Radiation Oncology

## 2020-09-02 DIAGNOSIS — Z510 Encounter for antineoplastic radiation therapy: Secondary | ICD-10-CM | POA: Diagnosis not present

## 2020-09-02 DIAGNOSIS — C3431 Malignant neoplasm of lower lobe, right bronchus or lung: Secondary | ICD-10-CM | POA: Diagnosis not present

## 2020-09-02 DIAGNOSIS — C7931 Secondary malignant neoplasm of brain: Secondary | ICD-10-CM | POA: Diagnosis not present

## 2020-09-06 ENCOUNTER — Inpatient Hospital Stay: Payer: Medicare HMO

## 2020-09-06 ENCOUNTER — Inpatient Hospital Stay: Payer: Medicare HMO | Admitting: Oncology

## 2020-09-06 ENCOUNTER — Inpatient Hospital Stay: Payer: Medicare HMO | Admitting: Hospice and Palliative Medicine

## 2020-09-06 ENCOUNTER — Ambulatory Visit
Admission: RE | Admit: 2020-09-06 | Discharge: 2020-09-06 | Disposition: A | Discharge status: Home / Self Care | Payer: Medicare HMO | Source: Ambulatory Visit | Attending: Radiation Oncology | Admitting: Radiation Oncology

## 2020-09-06 DIAGNOSIS — Z03818 Encounter for observation for suspected exposure to other biological agents ruled out: Secondary | ICD-10-CM | POA: Diagnosis not present

## 2020-09-06 DIAGNOSIS — C3431 Malignant neoplasm of lower lobe, right bronchus or lung: Secondary | ICD-10-CM | POA: Diagnosis not present

## 2020-09-06 DIAGNOSIS — Z20822 Contact with and (suspected) exposure to covid-19: Secondary | ICD-10-CM | POA: Diagnosis not present

## 2020-09-06 DIAGNOSIS — C7931 Secondary malignant neoplasm of brain: Secondary | ICD-10-CM | POA: Diagnosis not present

## 2020-09-06 DIAGNOSIS — Z510 Encounter for antineoplastic radiation therapy: Secondary | ICD-10-CM | POA: Diagnosis not present

## 2020-09-06 MED ORDER — SODIUM CHLORIDE 0.9% FLUSH
10.0000 mL | Freq: Once | INTRAVENOUS | Status: AC
  Filled 2020-09-06: qty 10

## 2020-09-06 MED ORDER — HEPARIN SOD (PORK) LOCK FLUSH 100 UNIT/ML IV SOLN
500.0000 [IU] | Freq: Once | INTRAVENOUS | Status: AC
  Filled 2020-09-06: qty 5

## 2020-09-06 NOTE — Progress Notes (Signed)
Error. Visit was cancelled due to COVID-10 exposure. Patient was sent for testing.

## 2020-09-07 ENCOUNTER — Ambulatory Visit: Payer: Medicare HMO

## 2020-09-08 ENCOUNTER — Ambulatory Visit: Payer: Medicare HMO

## 2020-09-09 ENCOUNTER — Ambulatory Visit: Payer: Medicare HMO

## 2020-09-09 ENCOUNTER — Telehealth: Payer: Self-pay | Admitting: *Deleted

## 2020-09-09 NOTE — Telephone Encounter (Signed)
Spoke with pt to review appt changes for next week. Pt made aware that chemo has been moved to Thursday and instructed to come on Monday at 1:15pm to finish brain radiation then will follow up with Josh at 1:30pm. Informed pt that I will give him his appts on Monday for when he is scheduled to start chemo. Pt verbalized understanding.

## 2020-09-12 ENCOUNTER — Ambulatory Visit: Payer: Medicare HMO

## 2020-09-12 ENCOUNTER — Ambulatory Visit
Admission: RE | Admit: 2020-09-12 | Discharge: 2020-09-12 | Disposition: A | Discharge status: Home / Self Care | Payer: Medicare HMO | Source: Ambulatory Visit | Attending: Radiation Oncology | Admitting: Radiation Oncology

## 2020-09-12 ENCOUNTER — Inpatient Hospital Stay: Payer: Medicare HMO | Admitting: Hospice and Palliative Medicine

## 2020-09-12 ENCOUNTER — Inpatient Hospital Stay: Payer: Medicare HMO | Admitting: Oncology

## 2020-09-12 ENCOUNTER — Inpatient Hospital Stay: Payer: Medicare HMO

## 2020-09-12 DIAGNOSIS — Z510 Encounter for antineoplastic radiation therapy: Secondary | ICD-10-CM | POA: Diagnosis not present

## 2020-09-12 DIAGNOSIS — C3431 Malignant neoplasm of lower lobe, right bronchus or lung: Secondary | ICD-10-CM | POA: Diagnosis not present

## 2020-09-12 DIAGNOSIS — C3491 Malignant neoplasm of unspecified part of right bronchus or lung: Secondary | ICD-10-CM | POA: Diagnosis not present

## 2020-09-12 DIAGNOSIS — C7931 Secondary malignant neoplasm of brain: Secondary | ICD-10-CM | POA: Diagnosis not present

## 2020-09-13 ENCOUNTER — Ambulatory Visit: Payer: Medicare HMO

## 2020-09-13 ENCOUNTER — Ambulatory Visit
Admission: RE | Admit: 2020-09-13 | Discharge: 2020-09-13 | Disposition: A | Discharge status: Home / Self Care | Payer: Medicare HMO | Source: Ambulatory Visit | Attending: Radiation Oncology | Admitting: Radiation Oncology

## 2020-09-13 DIAGNOSIS — C7931 Secondary malignant neoplasm of brain: Secondary | ICD-10-CM | POA: Diagnosis not present

## 2020-09-13 DIAGNOSIS — C3491 Malignant neoplasm of unspecified part of right bronchus or lung: Secondary | ICD-10-CM | POA: Diagnosis not present

## 2020-09-13 DIAGNOSIS — C3431 Malignant neoplasm of lower lobe, right bronchus or lung: Secondary | ICD-10-CM | POA: Diagnosis not present

## 2020-09-13 DIAGNOSIS — Z510 Encounter for antineoplastic radiation therapy: Secondary | ICD-10-CM | POA: Diagnosis not present

## 2020-09-14 ENCOUNTER — Ambulatory Visit
Admission: RE | Admit: 2020-09-14 | Discharge: 2020-09-14 | Disposition: A | Discharge status: Home / Self Care | Payer: Medicare HMO | Source: Ambulatory Visit | Attending: Radiation Oncology | Admitting: Radiation Oncology

## 2020-09-14 DIAGNOSIS — C3491 Malignant neoplasm of unspecified part of right bronchus or lung: Secondary | ICD-10-CM | POA: Diagnosis not present

## 2020-09-14 DIAGNOSIS — C7931 Secondary malignant neoplasm of brain: Secondary | ICD-10-CM | POA: Diagnosis not present

## 2020-09-14 DIAGNOSIS — Z510 Encounter for antineoplastic radiation therapy: Secondary | ICD-10-CM | POA: Diagnosis not present

## 2020-09-14 DIAGNOSIS — C3431 Malignant neoplasm of lower lobe, right bronchus or lung: Secondary | ICD-10-CM | POA: Diagnosis not present

## 2020-09-15 ENCOUNTER — Inpatient Hospital Stay: Payer: Medicare HMO | Attending: Oncology

## 2020-09-15 ENCOUNTER — Other Ambulatory Visit: Payer: Self-pay

## 2020-09-15 ENCOUNTER — Encounter: Payer: Self-pay | Admitting: Oncology

## 2020-09-15 ENCOUNTER — Encounter: Payer: Self-pay | Admitting: *Deleted

## 2020-09-15 ENCOUNTER — Inpatient Hospital Stay (HOSPITAL_BASED_OUTPATIENT_CLINIC_OR_DEPARTMENT_OTHER): Payer: Medicare HMO | Admitting: Hospice and Palliative Medicine

## 2020-09-15 ENCOUNTER — Inpatient Hospital Stay (HOSPITAL_BASED_OUTPATIENT_CLINIC_OR_DEPARTMENT_OTHER): Payer: Medicare HMO | Admitting: Oncology

## 2020-09-15 ENCOUNTER — Inpatient Hospital Stay: Payer: Medicare HMO

## 2020-09-15 ENCOUNTER — Ambulatory Visit
Admission: RE | Admit: 2020-09-15 | Discharge: 2020-09-15 | Disposition: A | Discharge status: Home / Self Care | Payer: Medicare HMO | Source: Ambulatory Visit | Attending: Radiation Oncology | Admitting: Radiation Oncology

## 2020-09-15 VITALS — BP 111/83 | HR 88 | Temp 96.3°F | Resp 18 | Wt 220.0 lb

## 2020-09-15 DIAGNOSIS — D701 Agranulocytosis secondary to cancer chemotherapy: Secondary | ICD-10-CM | POA: Diagnosis not present

## 2020-09-15 DIAGNOSIS — E785 Hyperlipidemia, unspecified: Secondary | ICD-10-CM | POA: Insufficient documentation

## 2020-09-15 DIAGNOSIS — T451X5A Adverse effect of antineoplastic and immunosuppressive drugs, initial encounter: Secondary | ICD-10-CM | POA: Insufficient documentation

## 2020-09-15 DIAGNOSIS — E876 Hypokalemia: Secondary | ICD-10-CM | POA: Insufficient documentation

## 2020-09-15 DIAGNOSIS — C3491 Malignant neoplasm of unspecified part of right bronchus or lung: Secondary | ICD-10-CM | POA: Diagnosis not present

## 2020-09-15 DIAGNOSIS — Z7952 Long term (current) use of systemic steroids: Secondary | ICD-10-CM | POA: Diagnosis not present

## 2020-09-15 DIAGNOSIS — Z5111 Encounter for antineoplastic chemotherapy: Secondary | ICD-10-CM | POA: Insufficient documentation

## 2020-09-15 DIAGNOSIS — Z7984 Long term (current) use of oral hypoglycemic drugs: Secondary | ICD-10-CM | POA: Diagnosis not present

## 2020-09-15 DIAGNOSIS — Z89612 Acquired absence of left leg above knee: Secondary | ICD-10-CM | POA: Diagnosis not present

## 2020-09-15 DIAGNOSIS — E119 Type 2 diabetes mellitus without complications: Secondary | ICD-10-CM | POA: Diagnosis not present

## 2020-09-15 DIAGNOSIS — C3431 Malignant neoplasm of lower lobe, right bronchus or lung: Secondary | ICD-10-CM

## 2020-09-15 DIAGNOSIS — Z79899 Other long term (current) drug therapy: Secondary | ICD-10-CM | POA: Insufficient documentation

## 2020-09-15 DIAGNOSIS — C7931 Secondary malignant neoplasm of brain: Secondary | ICD-10-CM | POA: Insufficient documentation

## 2020-09-15 DIAGNOSIS — Z833 Family history of diabetes mellitus: Secondary | ICD-10-CM | POA: Diagnosis not present

## 2020-09-15 DIAGNOSIS — I10 Essential (primary) hypertension: Secondary | ICD-10-CM | POA: Diagnosis not present

## 2020-09-15 DIAGNOSIS — Z923 Personal history of irradiation: Secondary | ICD-10-CM | POA: Insufficient documentation

## 2020-09-15 DIAGNOSIS — Z87891 Personal history of nicotine dependence: Secondary | ICD-10-CM | POA: Diagnosis not present

## 2020-09-15 DIAGNOSIS — Z515 Encounter for palliative care: Secondary | ICD-10-CM | POA: Diagnosis not present

## 2020-09-15 DIAGNOSIS — Z510 Encounter for antineoplastic radiation therapy: Secondary | ICD-10-CM | POA: Diagnosis not present

## 2020-09-15 DIAGNOSIS — R531 Weakness: Secondary | ICD-10-CM

## 2020-09-15 LAB — COMPREHENSIVE METABOLIC PANEL
ALT: 30 U/L (ref 0–44)
AST: 14 U/L — ABNORMAL LOW (ref 15–41)
Albumin: 3.8 g/dL (ref 3.5–5.0)
Alkaline Phosphatase: 64 U/L (ref 38–126)
Anion gap: 9 (ref 5–15)
BUN: 13 mg/dL (ref 6–20)
CO2: 29 mmol/L (ref 22–32)
Calcium: 9.3 mg/dL (ref 8.9–10.3)
Chloride: 96 mmol/L — ABNORMAL LOW (ref 98–111)
Creatinine, Ser: 0.58 mg/dL — ABNORMAL LOW (ref 0.61–1.24)
GFR calc Af Amer: >60 mL/min (ref >60)
GFR calc non Af Amer: >60 mL/min (ref >60)
Glucose, Bld: 155 mg/dL — ABNORMAL HIGH (ref 70–99)
Potassium: 3.4 mmol/L — ABNORMAL LOW (ref 3.5–5.1)
Sodium: 134 mmol/L — ABNORMAL LOW (ref 135–145)
Total Bilirubin: 0.5 mg/dL (ref 0.3–1.2)
Total Protein: 7.4 g/dL (ref 6.5–8.1)

## 2020-09-15 LAB — CBC WITH DIFFERENTIAL/PLATELET
Abs Immature Granulocytes: 0.04 10*3/uL (ref 0.00–0.07)
Basophils Absolute: 0.1 10*3/uL (ref 0.0–0.1)
Basophils Relative: 1 %
Eosinophils Absolute: 0.2 10*3/uL (ref 0.0–0.5)
Eosinophils Relative: 3 %
HCT: 38.9 % — ABNORMAL LOW (ref 39.0–52.0)
Hemoglobin: 13.4 g/dL (ref 13.0–17.0)
Immature Granulocytes: 1 %
Lymphocytes Relative: 33 %
Lymphs Abs: 2.2 10*3/uL (ref 0.7–4.0)
MCH: 31.5 pg (ref 26.0–34.0)
MCHC: 34.4 g/dL (ref 30.0–36.0)
MCV: 91.3 fL (ref 80.0–100.0)
Monocytes Absolute: 0.4 10*3/uL (ref 0.1–1.0)
Monocytes Relative: 6 %
Neutro Abs: 3.9 10*3/uL (ref 1.7–7.7)
Neutrophils Relative %: 56 %
Platelets: 195 10*3/uL (ref 150–400)
RBC: 4.26 MIL/uL (ref 4.22–5.81)
RDW: 16 % — ABNORMAL HIGH (ref 11.5–15.5)
WBC: 6.8 10*3/uL (ref 4.0–10.5)
nRBC: 0 % (ref 0.0–0.2)

## 2020-09-15 MED ORDER — SODIUM CHLORIDE 0.9% FLUSH
10.0000 mL | Freq: Once | INTRAVENOUS | Status: AC
  Administered 2020-09-15: 10 mL via INTRAVENOUS
  Filled 2020-09-15: qty 10

## 2020-09-15 MED ORDER — PALONOSETRON HCL INJECTION 0.25 MG/5ML
0.2500 mg | Freq: Once | INTRAVENOUS | Status: AC
  Administered 2020-09-15: 0.25 mg via INTRAVENOUS
  Filled 2020-09-15: qty 5

## 2020-09-15 MED ORDER — SODIUM CHLORIDE 0.9 % IV SOLN
300.0000 mg | Freq: Once | INTRAVENOUS | Status: AC
  Administered 2020-09-15: 300 mg via INTRAVENOUS
  Filled 2020-09-15: qty 30

## 2020-09-15 MED ORDER — SODIUM CHLORIDE 0.9 % IV SOLN
Freq: Once | INTRAVENOUS | Status: AC
  Filled 2020-09-15: qty 250

## 2020-09-15 MED ORDER — SODIUM CHLORIDE 0.9 % IV SOLN
20.0000 mg | Freq: Once | INTRAVENOUS | Status: AC
  Administered 2020-09-15: 20 mg via INTRAVENOUS
  Filled 2020-09-15: qty 20

## 2020-09-15 MED ORDER — HEPARIN SOD (PORK) LOCK FLUSH 100 UNIT/ML IV SOLN
INTRAVENOUS | Status: AC
  Filled 2020-09-15: qty 5

## 2020-09-15 MED ORDER — DIPHENHYDRAMINE HCL 50 MG/ML IJ SOLN
50.0000 mg | Freq: Once | INTRAMUSCULAR | Status: AC
  Administered 2020-09-15: 50 mg via INTRAVENOUS
  Filled 2020-09-15: qty 1

## 2020-09-15 MED ORDER — SODIUM CHLORIDE 0.9 % IV SOLN
80.0000 mg/m2 | Freq: Once | INTRAVENOUS | Status: AC
  Administered 2020-09-15: 186 mg via INTRAVENOUS
  Filled 2020-09-15: qty 31

## 2020-09-15 MED ORDER — FAMOTIDINE IN NACL 20-0.9 MG/50ML-% IV SOLN
20.0000 mg | Freq: Once | INTRAVENOUS | Status: AC
  Administered 2020-09-15: 20 mg via INTRAVENOUS
  Filled 2020-09-15: qty 50

## 2020-09-15 MED ORDER — HEPARIN SOD (PORK) LOCK FLUSH 100 UNIT/ML IV SOLN
500.0000 [IU] | Freq: Once | INTRAVENOUS | Status: AC | PRN
  Administered 2020-09-15: 500 [IU]
  Filled 2020-09-15: qty 5

## 2020-09-15 NOTE — Progress Notes (Signed)
[place]  [phone] [phone]   Name: Tyler Gallagher Date: 09/15/2020 MRN: [id number]  DOB: 12/27/1961  Patient Care Team: Juluis Pitch, MD as PCP - General (Family Medicine) Telford Nab, RN as Oncology Nurse Navigator    REASON FOR CONSULTATION: Tyler Gallagher is a 59 y.o. 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  PMH also notable for history of TIA, type 2 diabetes, tobacco abuse, obesity, and left AKA.  Patient is currently on systemic treatment with carboplatin/paclitaxel.  He was referred to palliative care to help address goals and manage ongoing symptoms.  SOCIAL HISTORY:     reports that he quit smoking about 2 months ago. His smoking use included cigarettes. He has a 64.00 pack-year smoking history. He has never used smokeless tobacco. He reports previous drug use. Drug: Marijuana. He reports that he does not drink alcohol.  Patient lives at home with his girlfriend and son.  He also has a daughter who does not live in the home.  Patient previously worked in [employer] but is disabled due to his AKA.  ADVANCE DIRECTIVES:  Not on file  CODE STATUS:   PAST MEDICAL HISTORY: Past Medical History:  Diagnosis Date   Diabetes mellitus without complication ([place])    Hyperlipidemia    Hypertension    Lesion of brain    Lung cancer ([place])    Mass of lung     PAST SURGICAL HISTORY:  Past Surgical History:  Procedure Laterality Date   amputation Left 2005   AKA   COLONOSCOPY     CYSTOSCOPY W/ RETROGRADES Bilateral 07/14/2020   Procedure: CYSTOSCOPY WITH RETROGRADE PYELOGRAM;  Surgeon: Hollice Espy, MD;  Location: [facility];  Service: Urology;  Laterality: Bilateral;   PORTA CATH INSERTION N/A 08/03/2020   Procedure: PORTA CATH INSERTION;  Surgeon: Katha Cabal, MD;  Location: [facility];   Service: Cardiovascular;  Laterality: N/A;   VIDEO BRONCHOSCOPY WITH ENDOBRONCHIAL NAVIGATION N/A 07/01/2020   Procedure: VIDEO BRONCHOSCOPY WITH ENDOBRONCHIAL NAVIGATION;  Surgeon: [name], MD;  Location: [facility];  Service: Thoracic;  Laterality: N/A;   VIDEO BRONCHOSCOPY WITH ENDOBRONCHIAL ULTRASOUND N/A 07/01/2020   Procedure: VIDEO BRONCHOSCOPY WITH ENDOBRONCHIAL ULTRASOUND;  Surgeon: [name], MD;  Location: [facility];  Service: Thoracic;  Laterality: N/A;    HEMATOLOGY/ONCOLOGY HISTORY:  Oncology History  Malignant neoplasm of lower lobe of right lung ([place])  07/07/2020 Cancer Staging   Staging form: Lung, AJCC 8th Edition - Clinical stage from 07/07/2020: Stage IVA (cT2a, cN0, cM1b) - Signed by [name], MD on 07/08/2020   07/08/2020 Initial Diagnosis   Malignant neoplasm of lower lobe of right lung ([place])   08/15/2020 -  Chemotherapy   The patient had dexamethasone (DECADRON) 4 MG tablet, 8 mg, Oral, Daily, 1 of 1 cycle, Start date: 07/26/2020, End date: 08/15/2020 palonosetron (ALOXI) injection 0.25 mg, 0.25 mg, Intravenous,  Once, 2 of 5 cycles Administration: 0.25 mg (08/15/2020), 0.25 mg (09/15/2020) CARBOplatin (PARAPLATIN) 300 mg in sodium chloride 0.9 % 250 mL chemo infusion, 300 mg (100 % of original dose 300 mg), Intravenous,  Once, 2 of 5 cycles Dose modification:   (original dose 300 mg, Cycle 1) Administration: 300 mg (09/15/2020) PACLitaxel (TAXOL) 186 mg in sodium chloride 0.9 % 250 mL chemo infusion (</= 29m/m2), 80 mg/m2 = 186 mg, Intravenous,  Once, 2 of 5 cycles Administration: 186  mg (09/15/2020)  for chemotherapy treatment.      ALLERGIES:  is allergic to penicillins.  MEDICATIONS:  Current Outpatient Medications  Medication Sig Dispense Refill   atorvastatin (LIPITOR) 10 MG tablet Take 10 mg by mouth every morning.      calcium carbonate (TUMS EX) 750 MG chewable tablet Chew 1 tablet by mouth 3 (three) times daily as needed for heartburn.      [employer] SENNA PLUS 8.6-50 MG tablet Take 2 tablets by mouth 2 (two) times daily.     Cyanocobalamin (RA VITAMIN B-12 TR) 1000 MCG TBCR Take 1,000 mcg by mouth in the morning and at bedtime.      dexamethasone (DECADRON) 4 MG tablet Take 1 tablet (4 mg total) by mouth 2 (two) times daily. 60 tablet 0   gabapentin (NEURONTIN) 300 MG capsule Take 900 mg by mouth 3 (three) times daily.     gemfibrozil (LOPID) 600 MG tablet Take 300 mg by mouth 2 (two) times daily.      HUMULIN R 100 UNIT/ML injection SMARTSIG:16 Unit(s) SUB-Q 3 Times Daily (Patient not taking: Reported on 09/06/2020)     lidocaine-prilocaine (EMLA) cream Apply to affected area once 30 g 3   lisinopril-hydrochlorothiazide (ZESTORETIC) 10-12.5 MG tablet Take 1 tablet by mouth daily.     LORazepam (ATIVAN) 0.5 MG tablet Take 1 tablet (0.5 mg total) by mouth every 8 (eight) hours as needed for anxiety. (Patient not taking: Reported on 09/06/2020) 30 tablet 0   metFORMIN (GLUCOPHAGE-XR) 500 MG 24 hr tablet Take 500 mg by mouth in the morning and at bedtime.      ondansetron (ZOFRAN) 4 MG tablet Take 4 mg by mouth every 8 (eight) hours as needed.  (Patient not taking: Reported on 09/06/2020)     ondansetron (ZOFRAN) 8 MG tablet Take 1 tablet (8 mg total) by mouth 2 (two) times daily as needed for refractory nausea / vomiting. Start on day 3 after chemo. (Patient not taking: Reported on 08/18/2020) 30 tablet 1   oxyCODONE (OXY IR/ROXICODONE) 5 MG immediate release tablet Take 1 tablet (5 mg total) by mouth every 6 (six) hours as needed for severe pain. (Patient not taking: Reported on 09/06/2020) 30 tablet 0   pantoprazole (PROTONIX) 20 MG tablet Take 1 tablet (20 mg total) by mouth daily. (Patient not taking: Reported on 09/06/2020) 30 tablet 1   polyethylene glycol powder (GLYCOLAX/MIRALAX) 17 GM/SCOOP powder Take by mouth. (Patient not taking: Reported on 08/18/2020)     prochlorperazine (COMPAZINE) 10 MG tablet Take 1 tablet (10 mg total) by  mouth every 6 (six) hours as needed (Nausea or vomiting). (Patient not taking: Reported on 08/03/2020) 30 tablet 1   No current facility-administered medications for this visit.   Facility-Administered Medications Ordered in Other Visits  Medication Dose Route Frequency Provider Last Rate Last Admin   heparin lock flush 100 unit/mL  500 Units Intravenous Once Charlaine Dalton R, MD       sodium chloride flush (NS) 0.9 % injection 10 mL  10 mL Intravenous Once Cammie Sickle, MD        VITAL SIGNS: There were no vitals taken for this visit. There were no vitals filed for this visit.  Estimated body mass index is 27.5 kg/m as calculated from the following:   Height as of 08/15/20: '6\' 3"'  (1.905 m).   Weight as of an earlier encounter on 09/15/20: 220 lb (99.8 kg).  LABS: CBC:    Component Value Date/Time  WBC 6.8 09/15/2020 0825   HGB 13.4 09/15/2020 0825   HCT 38.9 (L) 09/15/2020 0825   PLT 195 09/15/2020 0825   MCV 91.3 09/15/2020 0825   NEUTROABS 3.9 09/15/2020 0825   LYMPHSABS 2.2 09/15/2020 0825   MONOABS 0.4 09/15/2020 0825   EOSABS 0.2 09/15/2020 0825   BASOSABS 0.1 09/15/2020 0825   Comprehensive Metabolic Panel:    Component Value Date/Time   NA 134 (L) 09/15/2020 0825   K 3.4 (L) 09/15/2020 0825   CL 96 (L) 09/15/2020 0825   CO2 29 09/15/2020 0825   BUN 13 09/15/2020 0825   CREATININE 0.58 (L) 09/15/2020 0825   GLUCOSE 155 (H) 09/15/2020 0825   CALCIUM 9.3 09/15/2020 0825   AST 14 (L) 09/15/2020 0825   ALT 30 09/15/2020 0825   ALKPHOS 64 09/15/2020 0825   BILITOT 0.5 09/15/2020 0825   PROT 7.4 09/15/2020 0825   ALBUMIN 3.8 09/15/2020 0825    RADIOGRAPHIC STUDIES: DG Neck Soft Tissue  Result Date: 08/18/2020 CLINICAL DATA:  Productive cough with lump anterior neck EXAM: NECK SOFT TISSUES - 1+ VIEW COMPARISON:  None. FINDINGS: Prevertebral soft tissue thickness appears normal. There is no radiopaque foreign body visualized. The epiglottis appears  thin. Prominent cartilaginous calcification. Edentulous patient. Carotid vascular calcification. IMPRESSION: Negative. Neck CT may be performed if continued suspicion for a neck mass. Electronically Signed   By: Donavan Foil M.D.   On: 08/18/2020 15:42   DG Chest 2 View  Result Date: 08/18/2020 CLINICAL DATA:  Cough and swelling EXAM: CHEST - 2 VIEW COMPARISON:  06/16/2020, PET CT 06/29/2020 FINDINGS: Right lower lobe lung mass. No pleural effusion. Right-sided central venous port tip over the SVC. Patchy atelectasis or minimal infiltrate at the lingula. Normal heart size. No pneumothorax IMPRESSION: Right lower lobe lung mass. Patchy atelectasis or minimal infiltrate at the lingula. Electronically Signed   By: Donavan Foil M.D.   On: 08/18/2020 15:44   CT Chest Wo Contrast  Result Date: 08/19/2020 CLINICAL DATA:  Shortness of breath and swelling EXAM: CT CHEST WITHOUT CONTRAST TECHNIQUE: Multidetector CT imaging of the chest was performed following the standard protocol without IV contrast. COMPARISON:  Chest CT June 30, 2020 FINDINGS: Cardiovascular: RIGHT-sided Port-A-Cath in place terminating at the caval to atrial junction. Calcified atherosclerotic plaque in the thoracic aorta. Ascending aorta with maximal diameter of 4.3 cm similar to the prior study. No pericardial effusion. Heart size normal. Signs of prior myocardial infarction and coronary artery calcification. Limited assessment of central pulmonary vasculature and vascular structures in general due to lack contrast. Mediastinum/Nodes: Thoracic inlet structures are normal. No axillary lymphadenopathy. No mediastinal lymphadenopathy. No hilar lymphadenopathy. Hilar structures limited with respect to assessment. Esophagus grossly normal. Small lymph nodes in the subcarinal region are unchanged. Lungs/Pleura: RIGHT lower lobe pulmonary mass in the medial RIGHT lung (image 118, series 3) 3.8 x 3.5 cm, previously 3.6 by 3.4 cm. Severe paraseptal and  centrilobular emphysema that is worse at the lung apices. No consolidation. No pleural effusion. Subtle ground-glass in the posterior RIGHT chest along the medial RIGHT lung base with nodular features approximately 9 x 6 mm may be new from the previous exam. There was some nonfocal ground-glass in this location before (image 108, series 3) Tree-in-bud opacities in the LEFT lung base developed since the prior study also within the lingula. No dense consolidation though there is some volume loss associated with these changes in the lingula. Upper Abdomen: Incidental imaging of upper abdominal contents without acute  abnormality. Signs of cholelithiasis. Musculoskeletal: No chest wall mass. No acute bone finding no destructive bone process. Spinal degenerative changes. Cervical ribs, fused of first ribs bilaterally as before. IMPRESSION: 1. RIGHT lower lobe pulmonary mass in the medial RIGHT lung base may have increased slightly since the prior exam. 2. Subtle ground-glass in the posterior RIGHT chest along the medial RIGHT lung base with nodular features approximately 9 x 6 mm may be new from the previous exam. There was some nonfocal ground-glass in this location before. Findings may represent an infectious or inflammatory process. Particularly given the presence of tree-in-bud opacities in the LEFT chest that have developed in the LEFT lower lobe and in the lingula. Suggest short interval follow-up. Also would correlate with any respiratory symptoms given findings that appear infectious or inflammatory in the LEFT chest. 3. Ascending aorta with maximal diameter of 4.3 cm similar to the prior study. Recommend annual imaging followup by CTA or MRA. This recommendation follows 2010 ACCF/AHA/AATS/ACR/ASA/SCA/SCAI/SIR/STS/SVM Guidelines for the Diagnosis and Management of Patients with Thoracic Aortic Disease. Circulation. 2010; 121: U932-T557. Aortic aneurysm NOS (ICD10-I71.9) 4. Emphysema and aortic atherosclerosis.  Aortic Atherosclerosis (ICD10-I70.0) and Emphysema (ICD10-J43.9). Electronically Signed   By: Zetta Bills M.D.   On: 08/19/2020 13:28    PERFORMANCE STATUS (ECOG) : 2 - Symptomatic, <50% confined to bed  Review of Systems Unless otherwise noted, a complete review of systems is negative.  Physical Exam General: NAD Pulmonary: Unlabored Extremities: Left AKA Skin: no rashes Neurological: Weakness but otherwise nonfocal  IMPRESSION: I met with patient in the infusion area while he was receiving treatment.  I introduced palliative care services and attempted to establish therapeutic rapport.  Patient reports that overall he feels he is doing well.  He denies any symptomatic complaints today.  He denies any changes or concerns.  He reports good oral intake.  No pain or other distressing symptoms.  He feels he is coping reasonably well.  Patient says that at baseline, he lives at home with his girlfriend and son.  Says he is mostly independent with his own care.  He is wheelchair-bound at baseline.  He does endorse worse weakness over the past few months.  He says he used to be able to get his wheelchair in the back of the truck but now needs assistance with that.  Patient says that he feels he has good support at home.  We discussed resources available in the cancer center.  We will benefit from future conversation regarding advance care planning.  PLAN: -Continue current scope of treatment -We will plan to discuss ACP during a future visit -Follow-up virtual visit in 3 to 4 weeks   Patient expressed understanding and was in agreement with this plan. He also understands that He can call the clinic at any time with any questions, concerns, or complaints.     Time Total: 20 minutes  Visit consisted of counseling and education dealing with the complex and emotionally intense issues of symptom management and palliative care in the setting of serious and potentially life-threatening  illness.Greater than 50%  of this time was spent counseling and coordinating care related to the above assessment and plan.  Signed by: Altha Harm, PhD, NP-C

## 2020-09-15 NOTE — Progress Notes (Signed)
Oncology Nurse Navigator Documentation  Navigator Location: [facility] (09/15/20 0900)   )Navigator Encounter Type: Follow-up Appt;Treatment (09/15/20 0900)                     Patient Visit Type: MedOnc (09/15/20 0900) Treatment Phase: First Chemo Tx (09/15/20 0900) Barriers/Navigation Needs: Coordination of Care (09/15/20 0900)   Interventions: Coordination of Care (09/15/20 0900)   Coordination of Care: Appts (09/15/20 0900)     met with patient during follow up visit with [name] to start chemotherapy concurrently with lung radiation. All questions answered. Assisted pt with scheduling for covid vaccine. Pt informed that is scheduled for his 1st dose on Fri 09/30/20 at 2pm. Pt informed to bring someone with him that day to assist him to the [facility] for the vaccine. All other appts reviewed with the patient. Pt instructed to call with any further questions or needs. Pt verbalized understanding.             Time Spent with Patient: 30 (09/15/20 0900)

## 2020-09-16 ENCOUNTER — Ambulatory Visit
Admission: RE | Admit: 2020-09-16 | Discharge: 2020-09-16 | Disposition: A | Discharge status: Home / Self Care | Payer: Medicare HMO | Source: Ambulatory Visit | Attending: Radiation Oncology | Admitting: Radiation Oncology

## 2020-09-16 DIAGNOSIS — C3491 Malignant neoplasm of unspecified part of right bronchus or lung: Secondary | ICD-10-CM | POA: Diagnosis not present

## 2020-09-16 DIAGNOSIS — Z510 Encounter for antineoplastic radiation therapy: Secondary | ICD-10-CM | POA: Diagnosis not present

## 2020-09-16 DIAGNOSIS — C7931 Secondary malignant neoplasm of brain: Secondary | ICD-10-CM | POA: Diagnosis not present

## 2020-09-16 DIAGNOSIS — C3431 Malignant neoplasm of lower lobe, right bronchus or lung: Secondary | ICD-10-CM | POA: Diagnosis not present

## 2020-09-16 NOTE — Progress Notes (Signed)
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 CTA showed no pulmonary embolism but a 3.8 x 3.8 cm medial right lower lobe mass abutting the right paravertebral pleural margin. No involvement of adjacent vertebral bodies and consistent with primary lung malignancy. He then presented to ER with symptoms of increasing weakness over the right side. He underwent MRI brain with and without contrast at [facility] which showed a solitary metastatic deposit in the left medial frontal lobe with surrounding vasogenic edema. Also had a CT abdomen which showed large area of heterogeneous attenuation within the lumen of the urinary bladder. While this may represent a large amount of blood products the presence of urinary bladder mass not excluded.Patient taken for cystoscopy and TURBT which did not show any bladder mass  PET CT scan showed 3.7 cm right lower lobe mass with an SUV of 30.7. The mass abuts the visceral pleura without definite pleural invasion. No hypermetabolic adenopathy or evidence of distant metastatic disease. Patient underwent bronchoscopy with [name] which showed non-small cell lung cancer favoradenocarcinoma  Patient underwent resection of the solitary  left frontal lobe brain mass which was also consistent with TTF-1 positive lung adenocarcinoma. However repeat MRI brain showed possible Concern for new brain metastases and therefore patient received whole brain radiation.  Plan is for weekly carbotaxol chemotherapy followed by maintenance durvalumab. Omniseq testing showed high tumor mutational burden and PD-L1 of 70%.  No other actionable mutations noted.  Interval history-patient reports feeling at his baseline. He has chronic fatigue. Appetite and weight have remained stable. Denies cough or shortness of breath. He was exposed to Covid through his son who was not staying with him at this time. Patient denies any fever.  ECOG PS- 1 Pain scale- 0   Review of systems- Review of Systems  Constitutional: Positive for malaise/fatigue. Negative for chills, fever and weight loss.  HENT: Negative for congestion, ear discharge and nosebleeds.   Eyes: Negative for blurred vision.  Respiratory: Negative for cough, hemoptysis, sputum production, shortness of breath and wheezing.   Cardiovascular: Negative for chest pain, palpitations, orthopnea and claudication.  Gastrointestinal: Negative for abdominal pain, blood in stool, constipation, diarrhea, heartburn, melena, nausea and vomiting.  Genitourinary: Negative for dysuria, flank pain, frequency, hematuria and urgency.  Musculoskeletal: Negative for back pain, joint pain and myalgias.  Skin: Negative for rash.  Neurological: Negative for dizziness, tingling, focal weakness, seizures, weakness and headaches.  Endo/Heme/Allergies: Does not bruise/bleed easily.  Psychiatric/Behavioral: Negative for depression and suicidal ideas. The patient does not have insomnia.       Allergies  Allergen Reactions  . Penicillins Other (See Comments)    unknown     Past Medical History:  Diagnosis Date  . Diabetes mellitus without complication ([place])   . Hyperlipidemia   . Hypertension   . Lesion of  brain   . Lung cancer ([place])   . Mass of lung      Past Surgical History:  Procedure Laterality Date  . 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 COLONOSCOPY    . CYSTOSCOPY W/ RETROGRADES Bilateral 07/14/2020   Procedure: CYSTOSCOPY WITH RETROGRADE PYELOGRAM;  Surgeon: Hollice Espy, MD;  Location: [facility];  Service: Urology;  Laterality: Bilateral;  . PORTA CATH INSERTION N/A 08/03/2020   Procedure: PORTA CATH INSERTION;  Surgeon: Katha Cabal, MD;  Location: [facility];  Service: Cardiovascular;  Laterality: N/A;  . VIDEO BRONCHOSCOPY WITH ENDOBRONCHIAL NAVIGATION N/A 07/01/2020   Procedure: VIDEO BRONCHOSCOPY WITH ENDOBRONCHIAL NAVIGATION;  Surgeon: [name], MD;  Location: [facility];  Service: Thoracic;  Laterality: N/A;  . VIDEO BRONCHOSCOPY WITH ENDOBRONCHIAL ULTRASOUND N/A 07/01/2020   Procedure: VIDEO BRONCHOSCOPY WITH ENDOBRONCHIAL ULTRASOUND;  Surgeon: [name], MD;  Location: [facility];  Service: Thoracic;  Laterality: N/A;    Social History   Socioeconomic History  . Marital status: Divorced    Spouse name: Not on file  . Number of children: Not on file  . Years of education: Not on file  . Highest education level: Not on file  Occupational History  . Not on file  Tobacco Use  . Smoking status: Former Smoker    Packs/day: 2.00    Years: 32.00    Pack years: 64.00    Types: Cigarettes    Quit date: 06/21/2020    Years since quitting: 0.2  . Smokeless tobacco: Never Used  Vaping Use  . Vaping Use: Never used  Substance and Sexual Activity  . Alcohol use: No    Alcohol/week: 0.0 standard drinks  . Drug use: Not Currently    Types: Marijuana  . Sexual activity: Not on file  Other Topics Concern  . Not on file  Social History Narrative  . Not on file   Social Determinants of Health   Financial Resource Strain:   . Difficulty of Paying Living Expenses: Not on file  Food Insecurity:   . Worried About Charity fundraiser in the Last Year: Not  on file  . Ran Out of Food in the Last Year: Not on file  Transportation Needs:   . Lack of Transportation (Medical): Not on file  . Lack of Transportation (Non-Medical): Not on file  Physical Activity:   . Days of Exercise per Week: Not on file  . Minutes of Exercise per Session: Not on file  Stress:   . Feeling of Stress : Not on file  Social Connections:   . Frequency of Communication with Friends and Family: Not on file  . Frequency of Social Gatherings with Friends and Family: Not on file  . Attends Religious Services: Not on file  . Active Member of Clubs or Organizations: Not on file  . Attends Archivist Meetings: Not on file  . Marital Status: Not on file  Intimate Partner Violence:   . Fear of Current or Ex-Partner: Not on file  . Emotionally Abused: Not on file  . Physically Abused: Not on file  . Sexually Abused: Not on file    Family History  Problem Relation Age of Onset  . Diabetes Sister   . Diabetes Brother      Current Outpatient Medications:  .  atorvastatin (LIPITOR) 10 MG tablet, Take 10 mg by mouth every morning. , Disp: , Rfl:  .  calcium carbonate (TUMS EX) 750 MG chewable tablet, Chew  1 tablet by mouth 3 (three) times daily as needed for heartburn., Disp: , Rfl:  .  [employer] SENNA PLUS 8.6-50 MG tablet, Take 2 tablets by mouth 2 (two) times daily., Disp: , Rfl:  .  Cyanocobalamin (RA VITAMIN B-12 TR) 1000 MCG TBCR, Take 1,000 mcg by mouth in the morning and at bedtime. , Disp: , Rfl:  .  dexamethasone (DECADRON) 4 MG tablet, Take 1 tablet (4 mg total) by mouth 2 (two) times daily., Disp: 60 tablet, Rfl: 0 .  gabapentin (NEURONTIN) 300 MG capsule, Take 900 mg by mouth 3 (three) times daily., Disp: , Rfl:  .  gemfibrozil (LOPID) 600 MG tablet, Take 300 mg by mouth 2 (two) times daily. , Disp: , Rfl:  .  lidocaine-prilocaine (EMLA) cream, Apply to affected area once, Disp: 30 g, Rfl: 3 .  lisinopril-hydrochlorothiazide (ZESTORETIC) 10-12.5 MG tablet,  Take 1 tablet by mouth daily., Disp: , Rfl:  .  metFORMIN (GLUCOPHAGE-XR) 500 MG 24 hr tablet, Take 500 mg by mouth in the morning and at bedtime. , Disp: , Rfl:  .  HUMULIN R 100 UNIT/ML injection, SMARTSIG:16 Unit(s) SUB-Q 3 Times Daily (Patient not taking: Reported on 09/06/2020), Disp: , Rfl:  .  LORazepam (ATIVAN) 0.5 MG tablet, Take 1 tablet (0.5 mg total) by mouth every 8 (eight) hours as needed for anxiety. (Patient not taking: Reported on 09/06/2020), Disp: 30 tablet, Rfl: 0 .  ondansetron (ZOFRAN) 4 MG tablet, Take 4 mg by mouth every 8 (eight) hours as needed.  (Patient not taking: Reported on 09/06/2020), Disp: , Rfl:  .  ondansetron (ZOFRAN) 8 MG tablet, Take 1 tablet (8 mg total) by mouth 2 (two) times daily as needed for refractory nausea / vomiting. Start on day 3 after chemo. (Patient not taking: Reported on 08/18/2020), Disp: 30 tablet, Rfl: 1 .  oxyCODONE (OXY IR/ROXICODONE) 5 MG immediate release tablet, Take 1 tablet (5 mg total) by mouth every 6 (six) hours as needed for severe pain. (Patient not taking: Reported on 09/06/2020), Disp: 30 tablet, Rfl: 0 .  pantoprazole (PROTONIX) 20 MG tablet, Take 1 tablet (20 mg total) by mouth daily. (Patient not taking: Reported on 09/06/2020), Disp: 30 tablet, Rfl: 1 .  polyethylene glycol powder (GLYCOLAX/MIRALAX) 17 GM/SCOOP powder, Take by mouth. (Patient not taking: Reported on 08/18/2020), Disp: , Rfl:  .  prochlorperazine (COMPAZINE) 10 MG tablet, Take 1 tablet (10 mg total) by mouth every 6 (six) hours as needed (Nausea or vomiting). (Patient not taking: Reported on 08/03/2020), Disp: 30 tablet, Rfl: 1 No current facility-administered medications for this visit.  Facility-Administered Medications Ordered in Other Visits:  .  heparin lock flush 100 unit/mL, 500 Units, Intravenous, Once, Brahmanday, Govinda R, MD .  sodium chloride flush (NS) 0.9 % injection 10 mL, 10 mL, Intravenous, Once, Cammie Sickle, MD  Physical exam:  Vitals:    09/15/20 0848  BP: 111/83  Pulse: 88  Resp: 18  Temp: (!) 96.3 F (35.7 C)  TempSrc: Tympanic  SpO2: 99%  Weight: 220 lb (99.8 kg)   Physical Exam Cardiovascular:     Rate and Rhythm: Normal rate and regular rhythm.     Heart sounds: Normal heart sounds.  Pulmonary:     Effort: Pulmonary effort is normal.     Breath sounds: Normal breath sounds.  Abdominal:     General: Bowel sounds are normal.     Palpations: Abdomen is soft.  Musculoskeletal:     Comments: S/p left AKA  Skin:    General: Skin is warm and dry.  Neurological:     Mental Status: He is alert and oriented to person, place, and time.      CMP Latest Ref Rng & Units 09/15/2020  Glucose 70 - 99 mg/dL 155(H)  BUN 6 - 20 mg/dL 13  Creatinine 0.61 - 1.24 mg/dL 0.58(L)  Sodium 135 - 145 mmol/L 134(L)  Potassium 3.5 - 5.1 mmol/L 3.4(L)  Chloride 98 - 111 mmol/L 96(L)  CO2 22 - 32 mmol/L 29  Calcium 8.9 - 10.3 mg/dL 9.3  Total Protein 6.5 - 8.1 g/dL 7.4  Total Bilirubin 0.3 - 1.2 mg/dL 0.5  Alkaline Phos 38 - 126 U/L 64  AST 15 - 41 U/L 14(L)  ALT 0 - 44 U/L 30   CBC Latest Ref Rng & Units 09/15/2020  WBC 4.0 - 10.5 K/uL 6.8  Hemoglobin 13.0 - 17.0 g/dL 13.4  Hematocrit 39 - 52 % 38.9(L)  Platelets 150 - 400 K/uL 195    No images are attached to the encounter.  DG Neck Soft Tissue  Result Date: 08/18/2020 CLINICAL DATA:  Productive cough with lump anterior neck EXAM: NECK SOFT TISSUES - 1+ VIEW COMPARISON:  None. FINDINGS: Prevertebral soft tissue thickness appears normal. There is no radiopaque foreign body visualized. The epiglottis appears thin. Prominent cartilaginous calcification. Edentulous patient. Carotid vascular calcification. IMPRESSION: Negative. Neck CT may be performed if continued suspicion for a neck mass. Electronically Signed   By: Donavan Foil M.D.   On: 08/18/2020 15:42   DG Chest 2 View  Result Date: 08/18/2020 CLINICAL DATA:  Cough and swelling EXAM: CHEST - 2 VIEW COMPARISON:   06/16/2020, PET CT 06/29/2020 FINDINGS: Right lower lobe lung mass. No pleural effusion. Right-sided central venous port tip over the SVC. Patchy atelectasis or minimal infiltrate at the lingula. Normal heart size. No pneumothorax IMPRESSION: Right lower lobe lung mass. Patchy atelectasis or minimal infiltrate at the lingula. Electronically Signed   By: Donavan Foil M.D.   On: 08/18/2020 15:44   CT Chest Wo Contrast  Result Date: 08/19/2020 CLINICAL DATA:  Shortness of breath and swelling EXAM: CT CHEST WITHOUT CONTRAST TECHNIQUE: Multidetector CT imaging of the chest was performed following the standard protocol without IV contrast. COMPARISON:  Chest CT June 30, 2020 FINDINGS: Cardiovascular: RIGHT-sided Port-A-Cath in place terminating at the caval to atrial junction. Calcified atherosclerotic plaque in the thoracic aorta. Ascending aorta with maximal diameter of 4.3 cm similar to the prior study. No pericardial effusion. Heart size normal. Signs of prior myocardial infarction and coronary artery calcification. Limited assessment of central pulmonary vasculature and vascular structures in general due to lack contrast. Mediastinum/Nodes: Thoracic inlet structures are normal. No axillary lymphadenopathy. No mediastinal lymphadenopathy. No hilar lymphadenopathy. Hilar structures limited with respect to assessment. Esophagus grossly normal. Small lymph nodes in the subcarinal region are unchanged. Lungs/Pleura: RIGHT lower lobe pulmonary mass in the medial RIGHT lung (image 118, series 3) 3.8 x 3.5 cm, previously 3.6 by 3.4 cm. Severe paraseptal and centrilobular emphysema that is worse at the lung apices. No consolidation. No pleural effusion. Subtle ground-glass in the posterior RIGHT chest along the medial RIGHT lung base with nodular features approximately 9 x 6 mm may be new from the previous exam. There was some nonfocal ground-glass in this location before (image 108, series 3) Tree-in-bud opacities in the  LEFT lung base developed since the prior study also within the lingula. No dense consolidation though there is some volume loss  associated with these changes in the lingula. Upper Abdomen: Incidental imaging of upper abdominal contents without acute abnormality. Signs of cholelithiasis. Musculoskeletal: No chest wall mass. No acute bone finding no destructive bone process. Spinal degenerative changes. Cervical ribs, fused of first ribs bilaterally as before. IMPRESSION: 1. RIGHT lower lobe pulmonary mass in the medial RIGHT lung base may have increased slightly since the prior exam. 2. Subtle ground-glass in the posterior RIGHT chest along the medial RIGHT lung base with nodular features approximately 9 x 6 mm may be new from the previous exam. There was some nonfocal ground-glass in this location before. Findings may represent an infectious or inflammatory process. Particularly given the presence of tree-in-bud opacities in the LEFT chest that have developed in the LEFT lower lobe and in the lingula. Suggest short interval follow-up. Also would correlate with any respiratory symptoms given findings that appear infectious or inflammatory in the LEFT chest. 3. Ascending aorta with maximal diameter of 4.3 cm similar to the prior study. Recommend annual imaging followup by CTA or MRA. This recommendation follows 2010 ACCF/AHA/AATS/ACR/ASA/SCA/SCAI/SIR/STS/SVM Guidelines for the Diagnosis and Management of Patients with Thoracic Aortic Disease. Circulation. 2010; 121: X323-F573. Aortic aneurysm NOS (ICD10-I71.9) 4. Emphysema and aortic atherosclerosis. Aortic Atherosclerosis (ICD10-I70.0) and Emphysema (ICD10-J43.9). Electronically Signed   By: Zetta Bills M.D.   On: 08/19/2020 13:28     Assessment and plan- Patient is a 59 y.o. male  with stage IVacT2 cN0 cM1 adenocarcinoma of the right lung with isolated left frontal lobe metastases s/p resection of brain met. This was followed by whole brain radiation as  there was concern for new areas of brain metastases. He is here for on treatment assessment prior to cycle 1 of weekly carbotaxol chemotherapy  Counts okay to proceed with cycle 1 of weekly carbotaxol chemotherapy today. I will see him back in 1 week for cycle 2. Plan is for concurrent chemotherapy with radiation followed by maintenance durvalumab based on response on repeat scans.  Patient remains unvaccinated against Covid and have emphasized the importance of taking his vaccine.   Mild hypokalemia continue to monitor.  Patient is currently on steroids for his brain mets which is being tapered by radiation oncology   Visit Diagnosis 1. Encounter for antineoplastic chemotherapy   2. Malignant neoplasm of lower lobe of right lung ([place])      Dr. Randa Evens, MD, MPH [facility] [phone] 09/16/2020 3:10 PM

## 2020-09-19 ENCOUNTER — Ambulatory Visit
Admission: RE | Admit: 2020-09-19 | Discharge: 2020-09-19 | Disposition: A | Discharge status: Home / Self Care | Payer: Medicare HMO | Source: Ambulatory Visit | Attending: Radiation Oncology | Admitting: Radiation Oncology

## 2020-09-19 ENCOUNTER — Telehealth: Payer: Self-pay

## 2020-09-19 DIAGNOSIS — C7931 Secondary malignant neoplasm of brain: Secondary | ICD-10-CM | POA: Diagnosis not present

## 2020-09-19 DIAGNOSIS — Z510 Encounter for antineoplastic radiation therapy: Secondary | ICD-10-CM | POA: Diagnosis not present

## 2020-09-19 DIAGNOSIS — C3491 Malignant neoplasm of unspecified part of right bronchus or lung: Secondary | ICD-10-CM | POA: Diagnosis not present

## 2020-09-19 DIAGNOSIS — C3431 Malignant neoplasm of lower lobe, right bronchus or lung: Secondary | ICD-10-CM | POA: Diagnosis not present

## 2020-09-19 NOTE — Telephone Encounter (Signed)
T/C to pt for follow up after receiving first chemo.   No answer but left message stating we were calling to check on him after 1st chemo.  Encouraged pt to call for any questions or concerns.

## 2020-09-20 ENCOUNTER — Ambulatory Visit
Admission: RE | Admit: 2020-09-20 | Discharge: 2020-09-20 | Disposition: A | Discharge status: Home / Self Care | Payer: Medicare HMO | Source: Ambulatory Visit | Attending: Radiation Oncology | Admitting: Radiation Oncology

## 2020-09-20 DIAGNOSIS — C3491 Malignant neoplasm of unspecified part of right bronchus or lung: Secondary | ICD-10-CM | POA: Diagnosis not present

## 2020-09-20 DIAGNOSIS — Z510 Encounter for antineoplastic radiation therapy: Secondary | ICD-10-CM | POA: Diagnosis not present

## 2020-09-20 DIAGNOSIS — C7931 Secondary malignant neoplasm of brain: Secondary | ICD-10-CM | POA: Diagnosis not present

## 2020-09-20 DIAGNOSIS — C3431 Malignant neoplasm of lower lobe, right bronchus or lung: Secondary | ICD-10-CM | POA: Diagnosis not present

## 2020-09-21 ENCOUNTER — Ambulatory Visit
Admission: RE | Admit: 2020-09-21 | Discharge: 2020-09-21 | Disposition: A | Discharge status: Home / Self Care | Payer: Medicare HMO | Source: Ambulatory Visit | Attending: Radiation Oncology | Admitting: Radiation Oncology

## 2020-09-21 DIAGNOSIS — C3431 Malignant neoplasm of lower lobe, right bronchus or lung: Secondary | ICD-10-CM | POA: Diagnosis not present

## 2020-09-21 DIAGNOSIS — C3491 Malignant neoplasm of unspecified part of right bronchus or lung: Secondary | ICD-10-CM | POA: Diagnosis not present

## 2020-09-21 DIAGNOSIS — Z510 Encounter for antineoplastic radiation therapy: Secondary | ICD-10-CM | POA: Diagnosis not present

## 2020-09-21 DIAGNOSIS — C7931 Secondary malignant neoplasm of brain: Secondary | ICD-10-CM | POA: Diagnosis not present

## 2020-09-22 ENCOUNTER — Other Ambulatory Visit: Payer: Self-pay

## 2020-09-22 ENCOUNTER — Inpatient Hospital Stay: Payer: Medicare HMO

## 2020-09-22 ENCOUNTER — Inpatient Hospital Stay (HOSPITAL_BASED_OUTPATIENT_CLINIC_OR_DEPARTMENT_OTHER): Payer: Medicare HMO | Admitting: Oncology

## 2020-09-22 ENCOUNTER — Ambulatory Visit
Admission: RE | Admit: 2020-09-22 | Discharge: 2020-09-22 | Disposition: A | Discharge status: Home / Self Care | Payer: Medicare HMO | Source: Ambulatory Visit | Attending: Radiation Oncology | Admitting: Radiation Oncology

## 2020-09-22 ENCOUNTER — Encounter: Payer: Self-pay | Admitting: Oncology

## 2020-09-22 VITALS — BP 85/70 | HR 89 | Temp 96.3°F | Resp 18 | Wt 219.0 lb

## 2020-09-22 VITALS — BP 105/72 | HR 90

## 2020-09-22 DIAGNOSIS — E785 Hyperlipidemia, unspecified: Secondary | ICD-10-CM | POA: Diagnosis not present

## 2020-09-22 DIAGNOSIS — I9589 Other hypotension: Secondary | ICD-10-CM

## 2020-09-22 DIAGNOSIS — E119 Type 2 diabetes mellitus without complications: Secondary | ICD-10-CM | POA: Diagnosis not present

## 2020-09-22 DIAGNOSIS — Z510 Encounter for antineoplastic radiation therapy: Secondary | ICD-10-CM | POA: Diagnosis not present

## 2020-09-22 DIAGNOSIS — C3491 Malignant neoplasm of unspecified part of right bronchus or lung: Secondary | ICD-10-CM | POA: Diagnosis not present

## 2020-09-22 DIAGNOSIS — Z87891 Personal history of nicotine dependence: Secondary | ICD-10-CM | POA: Diagnosis not present

## 2020-09-22 DIAGNOSIS — C3431 Malignant neoplasm of lower lobe, right bronchus or lung: Secondary | ICD-10-CM

## 2020-09-22 DIAGNOSIS — Z5111 Encounter for antineoplastic chemotherapy: Secondary | ICD-10-CM | POA: Diagnosis not present

## 2020-09-22 DIAGNOSIS — T451X5A Adverse effect of antineoplastic and immunosuppressive drugs, initial encounter: Secondary | ICD-10-CM | POA: Diagnosis not present

## 2020-09-22 DIAGNOSIS — E876 Hypokalemia: Secondary | ICD-10-CM

## 2020-09-22 DIAGNOSIS — D701 Agranulocytosis secondary to cancer chemotherapy: Secondary | ICD-10-CM | POA: Diagnosis not present

## 2020-09-22 DIAGNOSIS — C7931 Secondary malignant neoplasm of brain: Secondary | ICD-10-CM | POA: Diagnosis not present

## 2020-09-22 LAB — COMPREHENSIVE METABOLIC PANEL
ALT: 33 U/L (ref 0–44)
AST: 17 U/L (ref 15–41)
Albumin: 3.8 g/dL (ref 3.5–5.0)
Alkaline Phosphatase: 58 U/L (ref 38–126)
Anion gap: 9 (ref 5–15)
BUN: 17 mg/dL (ref 6–20)
CO2: 27 mmol/L (ref 22–32)
Calcium: 8.8 mg/dL — ABNORMAL LOW (ref 8.9–10.3)
Chloride: 92 mmol/L — ABNORMAL LOW (ref 98–111)
Creatinine, Ser: 0.55 mg/dL — ABNORMAL LOW (ref 0.61–1.24)
GFR calc Af Amer: >60 mL/min (ref >60)
GFR calc non Af Amer: >60 mL/min (ref >60)
Glucose, Bld: 113 mg/dL — ABNORMAL HIGH (ref 70–99)
Potassium: 3.2 mmol/L — ABNORMAL LOW (ref 3.5–5.1)
Sodium: 128 mmol/L — ABNORMAL LOW (ref 135–145)
Total Bilirubin: 0.8 mg/dL (ref 0.3–1.2)
Total Protein: 6.9 g/dL (ref 6.5–8.1)

## 2020-09-22 LAB — CBC WITH DIFFERENTIAL/PLATELET
Abs Immature Granulocytes: 0.05 10*3/uL (ref 0.00–0.07)
Basophils Absolute: 0 10*3/uL (ref 0.0–0.1)
Basophils Relative: 1 %
Eosinophils Absolute: 0.1 10*3/uL (ref 0.0–0.5)
Eosinophils Relative: 2 %
HCT: 36.5 % — ABNORMAL LOW (ref 39.0–52.0)
Hemoglobin: 13.2 g/dL (ref 13.0–17.0)
Immature Granulocytes: 1 %
Lymphocytes Relative: 33 %
Lymphs Abs: 1.9 10*3/uL (ref 0.7–4.0)
MCH: 31.7 pg (ref 26.0–34.0)
MCHC: 36.2 g/dL — ABNORMAL HIGH (ref 30.0–36.0)
MCV: 87.7 fL (ref 80.0–100.0)
Monocytes Absolute: 0.3 10*3/uL (ref 0.1–1.0)
Monocytes Relative: 5 %
Neutro Abs: 3.4 10*3/uL (ref 1.7–7.7)
Neutrophils Relative %: 58 %
Platelets: 300 10*3/uL (ref 150–400)
RBC: 4.16 MIL/uL — ABNORMAL LOW (ref 4.22–5.81)
RDW: 15.4 % (ref 11.5–15.5)
WBC: 5.8 10*3/uL (ref 4.0–10.5)
nRBC: 0 % (ref 0.0–0.2)

## 2020-09-22 MED ORDER — SODIUM CHLORIDE 0.9 % IV SOLN
Freq: Once | INTRAVENOUS | Status: AC
  Filled 2020-09-22: qty 250

## 2020-09-22 MED ORDER — HEPARIN SOD (PORK) LOCK FLUSH 100 UNIT/ML IV SOLN
500.0000 [IU] | Freq: Once | INTRAVENOUS | Status: AC | PRN
  Administered 2020-09-22: 500 [IU]
  Filled 2020-09-22: qty 5

## 2020-09-22 MED ORDER — SODIUM CHLORIDE 0.9 % IV SOLN
80.0000 mg/m2 | Freq: Once | INTRAVENOUS | Status: AC
  Administered 2020-09-22: 186 mg via INTRAVENOUS
  Filled 2020-09-22: qty 31

## 2020-09-22 MED ORDER — PALONOSETRON HCL INJECTION 0.25 MG/5ML
0.2500 mg | Freq: Once | INTRAVENOUS | Status: AC
  Administered 2020-09-22: 0.25 mg via INTRAVENOUS
  Filled 2020-09-22: qty 5

## 2020-09-22 MED ORDER — SODIUM CHLORIDE 0.9 % IV SOLN
INTRAVENOUS | Status: AC
  Filled 2020-09-22: qty 1000

## 2020-09-22 MED ORDER — SODIUM CHLORIDE 0.9 % IV SOLN
300.0000 mg | Freq: Once | INTRAVENOUS | Status: AC
  Administered 2020-09-22: 300 mg via INTRAVENOUS
  Filled 2020-09-22: qty 30

## 2020-09-22 MED ORDER — DIPHENHYDRAMINE HCL 50 MG/ML IJ SOLN
50.0000 mg | Freq: Once | INTRAMUSCULAR | Status: AC
  Administered 2020-09-22: 50 mg via INTRAVENOUS
  Filled 2020-09-22: qty 1

## 2020-09-22 MED ORDER — POTASSIUM CHLORIDE CRYS ER 20 MEQ PO TBCR: 20.0000 meq | EXTENDED_RELEASE_TABLET | Freq: Two times a day (BID) | ORAL | 0 refills | Status: DC

## 2020-09-22 MED ORDER — SODIUM CHLORIDE 0.9 % IV SOLN
20.0000 mg | Freq: Once | INTRAVENOUS | Status: AC
  Administered 2020-09-22: 20 mg via INTRAVENOUS
  Filled 2020-09-22: qty 20

## 2020-09-22 MED ORDER — SODIUM CHLORIDE 0.9 % IV SOLN
Freq: Once | INTRAVENOUS | Status: DC
  Filled 2020-09-22: qty 250

## 2020-09-22 MED ORDER — FAMOTIDINE IN NACL 20-0.9 MG/50ML-% IV SOLN
20.0000 mg | Freq: Once | INTRAVENOUS | Status: AC
  Administered 2020-09-22: 20 mg via INTRAVENOUS
  Filled 2020-09-22: qty 50

## 2020-09-22 NOTE — Progress Notes (Signed)
B/p 85/70.  Per [name] proceed with treatment as ordered. Pt to also receive 1 liter NS with 20 MEQs of potassium IV over an hour.    According to [facility] all premeds (decadron, Aloxi, benadryl, and Pepcid) are compatible with Potassium.

## 2020-09-22 NOTE — Progress Notes (Signed)
Proceed with tx today per MD.  BP 85/70.  Getting IVF today

## 2020-09-22 NOTE — Progress Notes (Signed)
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 CTA showed no pulmonary embolism but a 3.8 x 3.8 cm medial right lower lobe mass abutting the right paravertebral pleural margin. No involvement of adjacent vertebral bodies and consistent with primary lung malignancy. He then presented to ER with symptoms of increasing weakness over the right side. He underwent MRI brain with and without contrast at [facility] which showed a solitary metastatic deposit in the left medial frontal lobe with surrounding vasogenic edema. Also had a CT abdomen which showed large area of heterogeneous attenuation within the lumen of the urinary bladder. While this may represent a large amount of blood products the presence of urinary bladder mass not excluded.Patient taken for cystoscopy and TURBT which did not show any bladder mass  PET CT scan showed 3.7 cm right lower lobe mass with an SUV of 30.7. The mass abuts the visceral pleura without definite pleural invasion. No hypermetabolic adenopathy or evidence of distant metastatic disease. Patient underwent bronchoscopy with [name] which showed non-small cell lung cancer favoradenocarcinoma  Patient underwent resection of the solitary  left frontal lobe brain mass which was also consistent with TTF-1 positive lung adenocarcinoma. However repeat MRI brain showed possible Concern for new brain metastases and therefore patient received whole brain radiation.  Plan is for weekly carbotaxol chemotherapy followed by maintenance durvalumab.Omniseqtesting showed high tumor mutational burden and PD-L1 of 70%. No other actionable mutations noted.   Interval history-tolerating chemotherapy well so far.  Reports ongoing fatigue.  Denies any fever cough or sputum production.  Had 1 day of diarrhea following chemotherapy that was self-limited. ECOG PS- 2 Pain scale- 0   Review of systems- Review of Systems  Constitutional: Positive for malaise/fatigue. Negative for chills, fever and weight loss.  HENT: Negative for congestion, ear discharge and nosebleeds.   Eyes: Negative for blurred vision.  Respiratory: Negative for cough, hemoptysis, sputum production, shortness of breath and wheezing.   Cardiovascular: Negative for chest pain, palpitations, orthopnea and claudication.  Gastrointestinal: Negative for abdominal pain, blood in stool, constipation, diarrhea, heartburn, melena, nausea and vomiting.  Genitourinary: Negative for dysuria, flank pain, frequency, hematuria and urgency.  Musculoskeletal: Negative for back pain, joint pain and myalgias.  Skin: Negative for rash.  Neurological: Negative for dizziness, tingling, focal weakness, seizures, weakness and headaches.  Endo/Heme/Allergies: Does not bruise/bleed easily.  Psychiatric/Behavioral: Negative for depression and suicidal ideas. The patient does not have insomnia.      Allergies  Allergen Reactions  . Penicillins Other (See Comments)    unknown     Past Medical History:  Diagnosis Date  . Diabetes mellitus without complication ([place])   . Hyperlipidemia   . Hypertension   . Lesion of brain   . Lung cancer ([place])   . Mass of lung      Past Surgical  History:    Procedure Laterality Date  . 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 COLONOSCOPY    . CYSTOSCOPY W/ RETROGRADES Bilateral 07/14/2020   Procedure: CYSTOSCOPY WITH RETROGRADE PYELOGRAM;  Surgeon: Hollice Espy, MD;  Location: [facility];  Service: Urology;  Laterality: Bilateral;  . PORTA CATH INSERTION N/A 08/03/2020   Procedure: PORTA CATH INSERTION;  Surgeon: Katha Cabal, MD;  Location: [place];  Service: Cardiovascular;  Laterality: N/A;  . VIDEO BRONCHOSCOPY WITH ENDOBRONCHIAL NAVIGATION N/A 07/01/2020   Procedure: VIDEO BRONCHOSCOPY WITH ENDOBRONCHIAL NAVIGATION;  Surgeon: [name], MD;  Location: [facility];  Service: Thoracic;  Laterality: N/A;  . VIDEO BRONCHOSCOPY WITH ENDOBRONCHIAL ULTRASOUND N/A 07/01/2020   Procedure: VIDEO BRONCHOSCOPY WITH ENDOBRONCHIAL ULTRASOUND;  Surgeon: [name], MD;  Location: [facility];  Service: Thoracic;  Laterality: N/A;    Social History   Socioeconomic History  . Marital status: Divorced    Spouse name: Not on file  . Number of children: Not on file  . Years of education: Not on file  . Highest education level: Not on file  Occupational History  . Not on file  Tobacco Use  . Smoking status: Former Smoker    Packs/day: 2.00    Years: 32.00    Pack years: 64.00    Types: Cigarettes    Quit date: 06/21/2020    Years since quitting: 0.2  . Smokeless tobacco: Never Used  Vaping Use  . Vaping Use: Never used  Substance and Sexual Activity  . Alcohol use: No    Alcohol/week: 0.0 standard drinks  . Drug use: Not Currently    Types: Marijuana  . Sexual activity: Not on file  Other Topics Concern  . Not on file  Social History Narrative  . Not on file   Social Determinants of Health   Financial Resource Strain:   . Difficulty of Paying Living Expenses: Not on file  Food Insecurity:   . Worried About Charity fundraiser in the Last Year: Not on file  . Ran Out of Food in the Last Year: Not on file  Transportation  Needs:   . Lack of Transportation (Medical): Not on file  . Lack of Transportation (Non-Medical): Not on file  Physical Activity:   . Days of Exercise per Week: Not on file  . Minutes of Exercise per Session: Not on file  Stress:   . Feeling of Stress : Not on file  Social Connections:   . Frequency of Communication with Friends and Family: Not on file  . Frequency of Social Gatherings with Friends and Family: Not on file  . Attends Religious Services: Not on file  . Active Member of Clubs or Organizations: Not on file  . Attends Archivist Meetings: Not on file  . Marital Status: Not on file  Intimate Partner Violence:   . Fear of Current or Ex-Partner: Not on file  . Emotionally Abused: Not on file  . Physically Abused: Not on file  . Sexually Abused: Not on file    Family History  Problem Relation Age of Onset  . Diabetes Sister   . Diabetes Brother      Current Outpatient Medications:  .  atorvastatin (LIPITOR) 10 MG tablet, Take 10 mg by mouth every morning. , Disp: , Rfl:  .  calcium carbonate (TUMS EX) 750 MG chewable tablet, Chew 1 tablet by mouth 3 (three) times daily as needed for heartburn., Disp: , Rfl:  .  [employer] SENNA PLUS 8.6-50 MG tablet, Take 2 tablets by mouth 2 (two) times daily., Disp: , Rfl:  .  Cyanocobalamin (RA VITAMIN B-12 TR) 1000 MCG TBCR, Take 1,000 mcg by mouth in the morning and at bedtime. , Disp: , Rfl:  .  dexamethasone (DECADRON) 4 MG tablet, Take 1 tablet (4 mg total) by mouth 2 (two) times daily., Disp: 60 tablet, Rfl: 0 .  gabapentin (NEURONTIN) 300 MG capsule, Take 900 mg by mouth 3 (three) times daily., Disp: , Rfl:  .  gemfibrozil (LOPID) 600 MG tablet, Take 300 mg by mouth 2 (two) times daily. , Disp: , Rfl:  .  lidocaine-prilocaine (EMLA) cream, Apply to affected area once, Disp: 30 g, Rfl: 3 .  lisinopril-hydrochlorothiazide (ZESTORETIC) 10-12.5 MG tablet, Take 1 tablet by mouth daily., Disp: , Rfl:  .  metFORMIN (GLUCOPHAGE-XR)  500 MG 24 hr tablet, Take 500 mg by mouth in the morning and at bedtime. , Disp: , Rfl:  .  HUMULIN R 100 UNIT/ML injection, SMARTSIG:16 Unit(s) SUB-Q 3 Times Daily (Patient not taking: Reported on 09/06/2020), Disp: , Rfl:  .  LORazepam (ATIVAN) 0.5 MG tablet, Take 1 tablet (0.5 mg total) by mouth every 8 (eight) hours as needed for anxiety. (Patient not taking: Reported on 09/06/2020), Disp: 30 tablet, Rfl: 0 .  ondansetron (ZOFRAN) 4 MG tablet, Take 4 mg by mouth every 8 (eight) hours as needed.  (Patient not taking: Reported on 09/06/2020), Disp: , Rfl:  .  ondansetron (ZOFRAN) 8 MG tablet, Take 1 tablet (8 mg total) by mouth 2 (two) times daily as needed for refractory nausea / vomiting. Start on day 3 after chemo. (Patient not taking: Reported on 08/18/2020), Disp: 30 tablet, Rfl: 1 .  oxyCODONE (OXY IR/ROXICODONE) 5 MG immediate release tablet, Take 1 tablet (5 mg total) by mouth every 6 (six) hours as needed for severe pain. (Patient not taking: Reported on 09/06/2020), Disp: 30 tablet, Rfl: 0 .  pantoprazole (PROTONIX) 20 MG tablet, Take 1 tablet (20 mg total) by mouth daily. (Patient not taking: Reported on 09/06/2020), Disp: 30 tablet, Rfl: 1 .  polyethylene glycol powder (GLYCOLAX/MIRALAX) 17 GM/SCOOP powder, Take by mouth. (Patient not taking: Reported on 08/18/2020), Disp: , Rfl:  .  prochlorperazine (COMPAZINE) 10 MG tablet, Take 1 tablet (10 mg total) by mouth every 6 (six) hours as needed (Nausea or vomiting). (Patient not taking: Reported on 08/03/2020), Disp: 30 tablet, Rfl: 1 No current facility-administered medications for this visit.  Facility-Administered Medications Ordered in Other Visits:  .  heparin lock flush 100 unit/mL, 500 Units, Intravenous, Once, Brahmanday, Govinda R, MD .  sodium chloride flush (NS) 0.9 % injection 10 mL, 10 mL, Intravenous, Once, Cammie Sickle, MD  Physical exam:  Vitals:   09/22/20 0847  BP: (!) 85/70  Pulse: 89  Resp: 18  Temp: (!) 96.3 F (35.7  C)  TempSrc: Tympanic  SpO2: 99%  Weight: 219 lb (99.3 kg)   Physical Exam Constitutional:      General: He is not in acute distress. Cardiovascular:     Rate and Rhythm: Normal rate and regular rhythm.     Heart sounds: Normal heart sounds.  Pulmonary:     Effort: Pulmonary effort is normal.     Breath sounds: Normal breath sounds.  Abdominal:     General: Bowel sounds are normal.     Palpations: Abdomen is soft.  Musculoskeletal:     Comments: S/p left AKA  Skin:  General: Skin is warm and dry.  Neurological:     Mental Status: He is alert and oriented to person, place, and time.      CMP Latest Ref Rng & Units 09/22/2020  Glucose 70 - 99 mg/dL 113(H)  BUN 6 - 20 mg/dL 17  Creatinine 0.61 - 1.24 mg/dL 0.55(L)  Sodium 135 - 145 mmol/L 128(L)  Potassium 3.5 - 5.1 mmol/L 3.2(L)  Chloride 98 - 111 mmol/L 92(L)  CO2 22 - 32 mmol/L 27  Calcium 8.9 - 10.3 mg/dL 8.8(L)  Total Protein 6.5 - 8.1 g/dL 6.9  Total Bilirubin 0.3 - 1.2 mg/dL 0.8  Alkaline Phos 38 - 126 U/L 58  AST 15 - 41 U/L 17  ALT 0 - 44 U/L 33   CBC Latest Ref Rng & Units 09/22/2020  WBC 4.0 - 10.5 K/uL 5.8  Hemoglobin 13.0 - 17.0 g/dL 13.2  Hematocrit 39 - 52 % 36.5(L)  Platelets 150 - 400 K/uL 300    Assessment and plan- Patient is a 59 y.o. male with stage IVacT2 cN0 cM1 adenocarcinoma of the right lung with isolated left frontal lobe metastasess/p resection of brain met. This was followed by whole brain radiation as there was concern for new areas of brain metastases.  He is here for on treatment assessment prior to cycle 2 of weekly carbotaxol chemotherapy  Counts okay to proceed with cycle 2 of weekly carbotaxol chemotherapy today.  He will directly proceed for cycle 3 of chemotherapy next week and I will see him back in 2 weeks for cycle 4.  Hyponatremia/hypokalemia: We will give him 1 L of IV fluids today with 20 mEq of IV potassium.  We will also send prescription for oral potassium to his  pharmacy  He is currently on tapering dose of steroids for his brain metastases.  Covid vaccination is scheduled for next month   Visit Diagnosis 1. Encounter for antineoplastic chemotherapy   2. Malignant neoplasm of lower lobe of right lung ([place])   3. Hypokalemia   4. Other specified hypotension      Dr. Randa Evens, MD, MPH [facility] [phone] 09/22/2020 9:01 AM

## 2020-09-23 ENCOUNTER — Ambulatory Visit
Admission: RE | Admit: 2020-09-23 | Discharge: 2020-09-23 | Disposition: A | Discharge status: Home / Self Care | Payer: Medicare HMO | Source: Ambulatory Visit | Attending: Radiation Oncology | Admitting: Radiation Oncology

## 2020-09-23 DIAGNOSIS — C7931 Secondary malignant neoplasm of brain: Secondary | ICD-10-CM | POA: Diagnosis not present

## 2020-09-23 DIAGNOSIS — C3431 Malignant neoplasm of lower lobe, right bronchus or lung: Secondary | ICD-10-CM | POA: Diagnosis not present

## 2020-09-23 DIAGNOSIS — Z510 Encounter for antineoplastic radiation therapy: Secondary | ICD-10-CM | POA: Diagnosis not present

## 2020-09-23 DIAGNOSIS — C3491 Malignant neoplasm of unspecified part of right bronchus or lung: Secondary | ICD-10-CM | POA: Diagnosis not present

## 2020-09-24 ENCOUNTER — Ambulatory Visit: Payer: Medicare HMO

## 2020-09-26 ENCOUNTER — Ambulatory Visit
Admission: RE | Admit: 2020-09-26 | Discharge: 2020-09-26 | Disposition: A | Discharge status: Home / Self Care | Payer: Medicare HMO | Source: Ambulatory Visit | Attending: Radiation Oncology | Admitting: Radiation Oncology

## 2020-09-26 DIAGNOSIS — C3491 Malignant neoplasm of unspecified part of right bronchus or lung: Secondary | ICD-10-CM | POA: Diagnosis not present

## 2020-09-26 DIAGNOSIS — C3431 Malignant neoplasm of lower lobe, right bronchus or lung: Secondary | ICD-10-CM | POA: Diagnosis not present

## 2020-09-26 DIAGNOSIS — C7931 Secondary malignant neoplasm of brain: Secondary | ICD-10-CM | POA: Diagnosis not present

## 2020-09-26 DIAGNOSIS — Z510 Encounter for antineoplastic radiation therapy: Secondary | ICD-10-CM | POA: Diagnosis not present

## 2020-09-27 ENCOUNTER — Ambulatory Visit
Admission: RE | Admit: 2020-09-27 | Discharge: 2020-09-27 | Disposition: A | Discharge status: Home / Self Care | Payer: Medicare HMO | Source: Ambulatory Visit | Attending: Radiation Oncology | Admitting: Radiation Oncology

## 2020-09-27 DIAGNOSIS — C3491 Malignant neoplasm of unspecified part of right bronchus or lung: Secondary | ICD-10-CM | POA: Diagnosis not present

## 2020-09-27 DIAGNOSIS — C7931 Secondary malignant neoplasm of brain: Secondary | ICD-10-CM | POA: Diagnosis not present

## 2020-09-27 DIAGNOSIS — Z510 Encounter for antineoplastic radiation therapy: Secondary | ICD-10-CM | POA: Diagnosis not present

## 2020-09-27 DIAGNOSIS — C3431 Malignant neoplasm of lower lobe, right bronchus or lung: Secondary | ICD-10-CM | POA: Diagnosis not present

## 2020-09-28 ENCOUNTER — Ambulatory Visit
Admission: RE | Admit: 2020-09-28 | Discharge: 2020-09-28 | Disposition: A | Discharge status: Home / Self Care | Payer: Medicare HMO | Source: Ambulatory Visit | Attending: Radiation Oncology | Admitting: Radiation Oncology

## 2020-09-28 DIAGNOSIS — C3431 Malignant neoplasm of lower lobe, right bronchus or lung: Secondary | ICD-10-CM | POA: Diagnosis not present

## 2020-09-28 DIAGNOSIS — Z510 Encounter for antineoplastic radiation therapy: Secondary | ICD-10-CM | POA: Diagnosis not present

## 2020-09-28 DIAGNOSIS — C7931 Secondary malignant neoplasm of brain: Secondary | ICD-10-CM | POA: Diagnosis not present

## 2020-09-28 DIAGNOSIS — C3491 Malignant neoplasm of unspecified part of right bronchus or lung: Secondary | ICD-10-CM | POA: Diagnosis not present

## 2020-09-29 ENCOUNTER — Inpatient Hospital Stay (HOSPITAL_BASED_OUTPATIENT_CLINIC_OR_DEPARTMENT_OTHER): Payer: Medicare HMO | Admitting: Oncology

## 2020-09-29 ENCOUNTER — Other Ambulatory Visit: Payer: Self-pay | Admitting: Oncology

## 2020-09-29 ENCOUNTER — Ambulatory Visit: Payer: Medicare HMO

## 2020-09-29 ENCOUNTER — Encounter: Payer: Self-pay | Admitting: *Deleted

## 2020-09-29 ENCOUNTER — Other Ambulatory Visit: Payer: Self-pay

## 2020-09-29 ENCOUNTER — Ambulatory Visit
Admission: RE | Admit: 2020-09-29 | Discharge: 2020-09-29 | Disposition: A | Discharge status: Home / Self Care | Payer: Medicare HMO | Source: Ambulatory Visit | Attending: Oncology | Admitting: Oncology

## 2020-09-29 ENCOUNTER — Ambulatory Visit
Admission: RE | Admit: 2020-09-29 | Discharge: 2020-09-29 | Disposition: A | Discharge status: Home / Self Care | Payer: Medicare HMO | Source: Ambulatory Visit | Attending: Radiation Oncology | Admitting: Radiation Oncology

## 2020-09-29 ENCOUNTER — Telehealth: Payer: Self-pay | Admitting: *Deleted

## 2020-09-29 ENCOUNTER — Inpatient Hospital Stay: Payer: Medicare HMO

## 2020-09-29 ENCOUNTER — Other Ambulatory Visit (INDEPENDENT_AMBULATORY_CARE_PROVIDER_SITE_OTHER): Payer: Self-pay | Admitting: Nurse Practitioner

## 2020-09-29 ENCOUNTER — Other Ambulatory Visit: Payer: Self-pay | Admitting: *Deleted

## 2020-09-29 ENCOUNTER — Inpatient Hospital Stay: Payer: Medicare HMO | Admitting: Oncology

## 2020-09-29 VITALS — BP 128/90 | HR 95 | Temp 96.5°F | Resp 18 | Wt 213.8 lb

## 2020-09-29 DIAGNOSIS — E876 Hypokalemia: Secondary | ICD-10-CM

## 2020-09-29 DIAGNOSIS — C3431 Malignant neoplasm of lower lobe, right bronchus or lung: Secondary | ICD-10-CM | POA: Diagnosis not present

## 2020-09-29 DIAGNOSIS — E119 Type 2 diabetes mellitus without complications: Secondary | ICD-10-CM | POA: Diagnosis not present

## 2020-09-29 DIAGNOSIS — T451X5A Adverse effect of antineoplastic and immunosuppressive drugs, initial encounter: Secondary | ICD-10-CM | POA: Insufficient documentation

## 2020-09-29 DIAGNOSIS — C3490 Malignant neoplasm of unspecified part of unspecified bronchus or lung: Secondary | ICD-10-CM

## 2020-09-29 DIAGNOSIS — Z510 Encounter for antineoplastic radiation therapy: Secondary | ICD-10-CM | POA: Diagnosis not present

## 2020-09-29 DIAGNOSIS — Z5111 Encounter for antineoplastic chemotherapy: Secondary | ICD-10-CM | POA: Diagnosis not present

## 2020-09-29 DIAGNOSIS — D701 Agranulocytosis secondary to cancer chemotherapy: Secondary | ICD-10-CM | POA: Diagnosis not present

## 2020-09-29 DIAGNOSIS — C7931 Secondary malignant neoplasm of brain: Secondary | ICD-10-CM | POA: Diagnosis not present

## 2020-09-29 DIAGNOSIS — Z452 Encounter for adjustment and management of vascular access device: Secondary | ICD-10-CM | POA: Diagnosis not present

## 2020-09-29 DIAGNOSIS — E785 Hyperlipidemia, unspecified: Secondary | ICD-10-CM | POA: Diagnosis not present

## 2020-09-29 DIAGNOSIS — Z87891 Personal history of nicotine dependence: Secondary | ICD-10-CM | POA: Diagnosis not present

## 2020-09-29 DIAGNOSIS — C3491 Malignant neoplasm of unspecified part of right bronchus or lung: Secondary | ICD-10-CM | POA: Diagnosis not present

## 2020-09-29 LAB — COMPREHENSIVE METABOLIC PANEL
ALT: 54 U/L — ABNORMAL HIGH (ref 0–44)
AST: 23 U/L (ref 15–41)
Albumin: 3.7 g/dL (ref 3.5–5.0)
Alkaline Phosphatase: 68 U/L (ref 38–126)
Anion gap: 11 (ref 5–15)
BUN: 11 mg/dL (ref 6–20)
CO2: 24 mmol/L (ref 22–32)
Calcium: 9 mg/dL (ref 8.9–10.3)
Chloride: 97 mmol/L — ABNORMAL LOW (ref 98–111)
Creatinine, Ser: 0.55 mg/dL — ABNORMAL LOW (ref 0.61–1.24)
GFR calc Af Amer: >60 mL/min (ref >60)
GFR calc non Af Amer: >60 mL/min (ref >60)
Glucose, Bld: 138 mg/dL — ABNORMAL HIGH (ref 70–99)
Potassium: 3.3 mmol/L — ABNORMAL LOW (ref 3.5–5.1)
Sodium: 132 mmol/L — ABNORMAL LOW (ref 135–145)
Total Bilirubin: 1.2 mg/dL (ref 0.3–1.2)
Total Protein: 7.2 g/dL (ref 6.5–8.1)

## 2020-09-29 LAB — CBC WITH DIFFERENTIAL/PLATELET
Abs Immature Granulocytes: 0.01 10*3/uL (ref 0.00–0.07)
Basophils Absolute: 0 10*3/uL (ref 0.0–0.1)
Basophils Relative: 1 %
Eosinophils Absolute: 0 10*3/uL (ref 0.0–0.5)
Eosinophils Relative: 1 %
HCT: 35.8 % — ABNORMAL LOW (ref 39.0–52.0)
Hemoglobin: 13.2 g/dL (ref 13.0–17.0)
Immature Granulocytes: 0 %
Lymphocytes Relative: 34 %
Lymphs Abs: 0.8 10*3/uL (ref 0.7–4.0)
MCH: 32 pg (ref 26.0–34.0)
MCHC: 36.9 g/dL — ABNORMAL HIGH (ref 30.0–36.0)
MCV: 86.7 fL (ref 80.0–100.0)
Monocytes Absolute: 0.3 10*3/uL (ref 0.1–1.0)
Monocytes Relative: 12 %
Neutro Abs: 1.3 10*3/uL — ABNORMAL LOW (ref 1.7–7.7)
Neutrophils Relative %: 52 %
Platelets: 286 10*3/uL (ref 150–400)
RBC: 4.13 MIL/uL — ABNORMAL LOW (ref 4.22–5.81)
RDW: 15.5 % (ref 11.5–15.5)
WBC: 2.4 10*3/uL — ABNORMAL LOW (ref 4.0–10.5)
nRBC: 0 % (ref 0.0–0.2)

## 2020-09-29 MED ORDER — FAMOTIDINE IN NACL 20-0.9 MG/50ML-% IV SOLN
20.0000 mg | Freq: Once | INTRAVENOUS | Status: AC
  Administered 2020-09-29: 20 mg via INTRAVENOUS
  Filled 2020-09-29: qty 50

## 2020-09-29 MED ORDER — SODIUM CHLORIDE 0.9 % IV SOLN
300.0000 mg | Freq: Once | INTRAVENOUS | Status: AC
  Administered 2020-09-29: 300 mg via INTRAVENOUS
  Filled 2020-09-29: qty 30

## 2020-09-29 MED ORDER — SODIUM CHLORIDE 0.9 % IV SOLN
Freq: Once | INTRAVENOUS | Status: DC
  Filled 2020-09-29: qty 1000

## 2020-09-29 MED ORDER — SODIUM CHLORIDE 0.9 % IV SOLN
Freq: Once | INTRAVENOUS | Status: AC
  Filled 2020-09-29: qty 250

## 2020-09-29 MED ORDER — HEPARIN SOD (PORK) LOCK FLUSH 100 UNIT/ML IV SOLN
500.0000 [IU] | Freq: Once | INTRAVENOUS | Status: DC
  Filled 2020-09-29: qty 5

## 2020-09-29 MED ORDER — POTASSIUM CHLORIDE CRYS ER 20 MEQ PO TBCR: 20.0000 meq | EXTENDED_RELEASE_TABLET | Freq: Every day | ORAL | 0 refills | Status: DC

## 2020-09-29 MED ORDER — DIPHENHYDRAMINE HCL 50 MG/ML IJ SOLN
50.0000 mg | Freq: Once | INTRAMUSCULAR | Status: AC
  Administered 2020-09-29: 50 mg via INTRAVENOUS
  Filled 2020-09-29: qty 1

## 2020-09-29 MED ORDER — SODIUM CHLORIDE 0.9 % IV SOLN
20.0000 mg | Freq: Once | INTRAVENOUS | Status: AC
  Administered 2020-09-29: 20 mg via INTRAVENOUS
  Filled 2020-09-29: qty 20

## 2020-09-29 MED ORDER — SODIUM CHLORIDE 0.9% FLUSH
10.0000 mL | INTRAVENOUS | Status: DC | PRN
  Filled 2020-09-29: qty 10

## 2020-09-29 MED ORDER — PALONOSETRON HCL INJECTION 0.25 MG/5ML
0.2500 mg | Freq: Once | INTRAVENOUS | Status: AC
  Administered 2020-09-29: 0.25 mg via INTRAVENOUS
  Filled 2020-09-29: qty 5

## 2020-09-29 MED ORDER — SODIUM CHLORIDE 0.9 % IV SOLN
80.0000 mg/m2 | Freq: Once | INTRAVENOUS | Status: AC
  Administered 2020-09-29: 186 mg via INTRAVENOUS
  Filled 2020-09-29: qty 31

## 2020-09-29 NOTE — Progress Notes (Signed)
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 CTA showed no pulmonary embolism but a 3.8 x 3.8 cm medial right lower lobe mass abutting the right paravertebral pleural margin. No involvement of adjacent vertebral bodies and consistent with primary lung malignancy. He then presented to ER with symptoms of increasing weakness over the right side. He underwent MRI brain with and without contrast at [facility] which showed a solitary metastatic deposit in the left medial frontal lobe with surrounding vasogenic edema. Also had a CT abdomen which showed large area of heterogeneous attenuation within the lumen of the urinary bladder. While this may represent a large amount of blood products the presence of urinary bladder mass not excluded.Patient taken for cystoscopy and TURBT which did not show any bladder mass  PET CT scan showed 3.7 cm right lower lobe mass with an SUV of 30.7. The mass abuts the visceral pleura without definite pleural invasion. No hypermetabolic adenopathy or evidence of distant metastatic disease. Patient underwent bronchoscopy with [name] which showed non-small cell lung cancer favoradenocarcinoma  Patient underwent resection of the  solitary left frontal lobe brain mass which was also consistent with TTF-1 positive lung adenocarcinoma.However repeat MRI brain showed possible Concern for new brain metastases and therefore patient received whole brain radiation.  Plan is for weekly carbotaxol chemotherapy followed by maintenance durvalumab.Omniseqtesting showed high tumor mutational burden and PD-L1 of 70%. No other actionable mutations noted.  Interval history- reports feeling fatigued. Says he has a metallic taste in his mouth most of the day and he is unable to eat much. He does not like the taste of regular ensure. Denies significant nausea.  Port access has been an issue today. It is neither flushing nor drawing blood  ECOG PS- 2 Pain scale- 0  Review of systems- Review of Systems  Constitutional: Positive for malaise/fatigue. Negative for chills, fever and weight loss.       Metallic taste in mouth  HENT: Negative for congestion, ear discharge and nosebleeds.   Eyes: Negative for blurred vision.  Respiratory: Negative for cough, hemoptysis, sputum production, shortness of breath and wheezing.   Cardiovascular: Negative for chest pain, palpitations, orthopnea and claudication.  Gastrointestinal: Negative for abdominal pain, blood in stool, constipation, diarrhea, heartburn, melena, nausea and vomiting.  Genitourinary: Negative for dysuria, flank pain, frequency, hematuria and urgency.  Musculoskeletal: Negative for back pain, joint pain and myalgias.  Skin: Negative for rash.  Neurological: Negative for dizziness, tingling, focal weakness, seizures, weakness and headaches.  Endo/Heme/Allergies: Does not bruise/bleed easily.  Psychiatric/Behavioral: Negative for depression and suicidal ideas. The patient does not have insomnia.       Allergies  Allergen Reactions  . Penicillins Other (See Comments)    unknown     Past Medical History:  Diagnosis Date  . Diabetes mellitus  without complication ([place])     . Hyperlipidemia   . Hypertension   . Lesion of brain   . Lung cancer ([place])   . Mass of lung      Past Surgical History:  Procedure Laterality Date  . 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 COLONOSCOPY    . CYSTOSCOPY W/ RETROGRADES Bilateral 07/14/2020   Procedure: CYSTOSCOPY WITH RETROGRADE PYELOGRAM;  Surgeon: Hollice Espy, MD;  Location: [facility];  Service: Urology;  Laterality: Bilateral;  . PORTA CATH INSERTION N/A 08/03/2020   Procedure: PORTA CATH INSERTION;  Surgeon: Katha Cabal, MD;  Location: [place] [facility];  Service: Cardiovascular;  Laterality: N/A;  . VIDEO BRONCHOSCOPY WITH ENDOBRONCHIAL NAVIGATION N/A 07/01/2020   Procedure: VIDEO BRONCHOSCOPY WITH ENDOBRONCHIAL NAVIGATION;  Surgeon: [name], MD;  Location: [facility] OR[facility];  Service: Thoracic;  Laterality: N/A;  . VIDEO BRONCHOSCOPY WITH ENDOBRONCHIAL ULTRASOUND N/A 07/01/2020   Procedure: VIDEO BRONCHOSCOPY WITH ENDOBRONCHIAL ULTRASOUND;  Surgeon: [name], MD;  Location: [facility];  Service: Thoracic;  Laterality: N/A;    Social History   Socioeconomic History  . Marital status: Divorced    Spouse name: Not on file  . Number of children: Not on file  . Years of education: Not on file  . Highest education level: Not on file  Occupational History  . Not on file  Tobacco Use  . Smoking status: Former Smoker    Packs/day: 2.00    Years: 32.00    Pack years: 64.00    Types: Cigarettes    Quit date: 06/21/2020    Years since quitting: 0.2  . Smokeless tobacco: Never Used  Vaping Use  . Vaping Use: Never used  Substance and Sexual Activity  . Alcohol use: No    Alcohol/week: 0.0 standard drinks  . Drug use: Not Currently    Types: Marijuana  . Sexual activity: Not on file  Other Topics Concern  . Not on file  Social History Narrative  . Not on file   Social Determinants of Health   Financial Resource Strain:   . Difficulty of Paying Living Expenses: Not on file  Food Insecurity:   .  Worried About Charity fundraiser in the Last Year: Not on file  . Ran Out of Food in the Last Year: Not on file  Transportation Needs:   . Lack of Transportation (Medical): Not on file  . Lack of Transportation (Non-Medical): Not on file  Physical Activity:   . Days of Exercise per Week: Not on file  . Minutes of Exercise per Session: Not on file  Stress:   . Feeling of Stress : Not on file  Social Connections:   . Frequency of Communication with Friends and Family: Not on file  . Frequency of Social Gatherings with Friends and Family: Not on file  . Attends Religious Services: Not on file  . Active Member of Clubs or Organizations: Not on file  . Attends Archivist Meetings: Not on file  . Marital Status: Not on file  Intimate Partner Violence:   . Fear of Current or Ex-Partner: Not on file  . Emotionally Abused: Not on file  . Physically Abused: Not on file  . Sexually Abused: Not on file    Family History  Problem Relation Age of Onset  . Diabetes Sister   . Diabetes Brother      Current Outpatient Medications:  .  atorvastatin (LIPITOR) 10 MG tablet, Take 10 mg by mouth  every morning. , Disp: , Rfl:  .  calcium carbonate (TUMS EX) 750 MG chewable tablet, Chew 1 tablet by mouth 3 (three) times daily as needed for heartburn., Disp: , Rfl:  .  [employer] SENNA PLUS 8.6-50 MG tablet, Take 2 tablets by mouth at bedtime as needed for mild constipation. , Disp: , Rfl:  .  dexamethasone (DECADRON) 4 MG tablet, Take 1 tablet (4 mg total) by mouth 2 (two) times daily. (Patient not taking: Reported on 09/29/2020), Disp: 60 tablet, Rfl: 0 .  gabapentin (NEURONTIN) 300 MG capsule, Take 900 mg by mouth 3 (three) times daily., Disp: , Rfl:  .  gemfibrozil (LOPID) 600 MG tablet, Take 300 mg by mouth 2 (two) times daily. , Disp: , Rfl:  .  lidocaine-prilocaine (EMLA) cream, Apply to affected area once, Disp: 30 g, Rfl: 3 .  LORazepam (ATIVAN) 0.5 MG tablet, Take 1 tablet (0.5 mg total)  by mouth every 8 (eight) hours as needed for anxiety. (Patient not taking: Reported on 09/06/2020), Disp: 30 tablet, Rfl: 0 .  metFORMIN (GLUCOPHAGE-XR) 500 MG 24 hr tablet, Take 500 mg by mouth in the morning and at bedtime. , Disp: , Rfl:  .  ondansetron (ZOFRAN) 4 MG tablet, Take 4 mg by mouth every 8 (eight) hours as needed.  (Patient not taking: Reported on 09/06/2020), Disp: , Rfl:  .  ondansetron (ZOFRAN) 8 MG tablet, Take 1 tablet (8 mg total) by mouth 2 (two) times daily as needed for refractory nausea / vomiting. Start on day 3 after chemo. (Patient not taking: Reported on 08/18/2020), Disp: 30 tablet, Rfl: 1 .  oxyCODONE (OXY IR/ROXICODONE) 5 MG immediate release tablet, Take 1 tablet (5 mg total) by mouth every 6 (six) hours as needed for severe pain. (Patient not taking: Reported on 09/06/2020), Disp: 30 tablet, Rfl: 0 .  pantoprazole (PROTONIX) 20 MG tablet, Take 1 tablet (20 mg total) by mouth daily. (Patient not taking: Reported on 09/06/2020), Disp: 30 tablet, Rfl: 1 .  polyethylene glycol powder (GLYCOLAX/MIRALAX) 17 GM/SCOOP powder, Take by mouth. (Patient not taking: Reported on 08/18/2020), Disp: , Rfl:  .  potassium chloride SA (KLOR-CON) 20 MEQ tablet, Take 1 tablet (20 mEq total) by mouth daily., Disp: 14 tablet, Rfl: 0 .  prochlorperazine (COMPAZINE) 10 MG tablet, Take 1 tablet (10 mg total) by mouth every 6 (six) hours as needed (Nausea or vomiting). (Patient not taking: Reported on 08/03/2020), Disp: 30 tablet, Rfl: 1 No current facility-administered medications for this visit.  Facility-Administered Medications Ordered in Other Visits:  .  heparin lock flush 100 unit/mL, 500 Units, Intravenous, Once, Brahmanday, Govinda R, MD .  sodium chloride flush (NS) 0.9 % injection 10 mL, 10 mL, Intravenous, Once, Rogue Bussing, Elisha Headland, MD   Physical Exam Constitutional:      General: He is not in acute distress.    Comments: Appears fatigued  Cardiovascular:     Rate and Rhythm: Normal  rate and regular rhythm.     Heart sounds: Normal heart sounds.  Pulmonary:     Effort: Pulmonary effort is normal.     Breath sounds: Normal breath sounds.  Abdominal:     General: Bowel sounds are normal.     Palpations: Abdomen is soft.  Musculoskeletal:     Cervical back: Normal range of motion.  Skin:    General: Skin is warm and dry.  Neurological:     Mental Status: He is alert and oriented to person, place, and time.  CMP Latest Ref Rng & Units 09/29/2020  Glucose 70 - 99 mg/dL 138(H)  BUN 6 - 20 mg/dL 11  Creatinine 0.61 - 1.24 mg/dL 0.55(L)  Sodium 135 - 145 mmol/L 132(L)  Potassium 3.5 - 5.1 mmol/L 3.3(L)  Chloride 98 - 111 mmol/L 97(L)  CO2 22 - 32 mmol/L 24  Calcium 8.9 - 10.3 mg/dL 9.0  Total Protein 6.5 - 8.1 g/dL 7.2  Total Bilirubin 0.3 - 1.2 mg/dL 1.2  Alkaline Phos 38 - 126 U/L 68  AST 15 - 41 U/L 23  ALT 0 - 44 U/L 54(H)   CBC Latest Ref Rng & Units 09/29/2020  WBC 4.0 - 10.5 K/uL 2.4(L)  Hemoglobin 13.0 - 17.0 g/dL 13.2  Hematocrit 39 - 52 % 35.8(L)  Platelets 150 - 400 K/uL 286    No images are attached to the encounter.  DG Fluoro Guide CV Line Right  Result Date: 09/29/2020 INDICATION: Redness and swelling around port. EXAM: PORT CHECK MEDICATIONS: None. ANESTHESIA/SEDATION: None. FLUOROSCOPY TIME:  Fluoroscopy Time: 0 minutes minutes 30 seconds (11.2 mGy). COMPLICATIONS: None immediate. PROCEDURE: Port check was performed under fluoroscopic guidance using nonionic contrast. No flow could be demonstrated into the needle tip and port. Question is the needle tip well positioned. Reaccess should be considered. If further evaluation is needed after reaccess repeat port check can be performed. IMPRESSION: No flow could be demonstrated into the needle tip and port. Questions needle tip well position. Reaccess should be considered. Further evaluation is needed after reassess repeat port check can be performed. These results will be called to the ordering  clinician or representative by the Radiologist Assistant, and communication documented in the PACS or [employer]. Electronically Signed   By: Marcello Moores  Register   On: 09/29/2020 14:09     Assessment and plan- Patient is a 59 y.o. male with stage IVacT2 cN0 cM1 adenocarcinoma of the right lung with isolated left frontal lobe metastasess/p resection of brain met.This was followed by whole brain radiation as there was concern for new areas of brain metastases.  He is here for on treatment assessment prior to cycle 3 of carbo taxol chemotherapy  Chemo induced neutropenia: This is week 3 of chemotherapy.wbc is trending low with ANC 1.3. He will get chemotherapy today. However her has 4 more cycles to go and I am concerned his neutropenia may cause interruptions in his treatment. He cannot get neulasta with RT and weekly chemo. I would like to give 1-2 doses of weekly neupogen to get him through chemo/RT without interruptions based on his [place]. Will attempt insurance approval for the same  Metallic taste in mouth: no mucositis. Denies any nausea. He is also meeting nutrition today. I have encouraged him to try ensure clear if he does not like regular ensure.  He will receive 1L IVF today. Port will need dye study if it can be flushed but if that doesn't work, vascular surgery referral  Hypokalemia- will send prescription for PO K   I will see him next week as scheduled.  Visit Diagnosis 1. Encounter for antineoplastic chemotherapy   2. Chemotherapy induced neutropenia (HCC)   3. Malignant neoplasm of lower lobe of right lung ([place])   4. Hypokalemia      Dr. Randa Evens, MD, MPH [facility] [phone] 09/29/2020 9:02 PM

## 2020-09-29 NOTE — Progress Notes (Signed)
0820: Two nurses accessed port. There was resistance with flushing and skin cool to touch around port, no blood return noted. Pt denies pain at this time but reports that "several days ago, I strained to get out of bed and I must have pulled something. I heard a pop and it hurt." referring to his port site.  [name] aware, Infusion nurse aware.

## 2020-09-29 NOTE — Telephone Encounter (Signed)
Called report. Blima Singer, RN informed  IMPRESSION: No flow could be demonstrated into the needle tip and port. Questions needle tip well position. Reaccess should be considered. Further evaluation is needed after reassess repeat port check can be performed.  These results will be called to the ordering clinician or representative by the Radiologist Assistant, and communication documented in the PACS or [employer].   Electronically Signed   By: Marcello Moores  Register   On: 09/29/2020 14:09

## 2020-09-29 NOTE — Telephone Encounter (Signed)
Called pt. And let him know that vascular patient scheduled with a 2:45 pm arrival to the [facility] on 09/30/20, NPO, no Metformin, and one person with him. He has to have his covid test tomorrow morning at 8:00 am for his procedure. All of this is what I told him and he was agreeable to it. I also told him that we do not have approval at this time for the inj. Tomorrow . It has went to appeal and we may hear something tom. Otherwise he might not get it. When he comes 1:30 for radiation then I will tell him yes or no to inj.  He is ok with this

## 2020-09-29 NOTE — Progress Notes (Signed)
Two nurses accessed port and there was resistance to flushing , no blood return. MD aware. Ordered Dye flow study. Ok per MD to treat with anc 1.3 . Will get auth for Neupogen . Gave pt tx peripherally- completed without incident.

## 2020-09-29 NOTE — Progress Notes (Signed)
Nutrition Assessment   Reason for Assessment:  Referral from [place], [place] regarding poor appetite, weight loss   ASSESSMENT:  59 year old male with stage IV lung cancer Patient receiving carbotaxol weekly and radiation. 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  Patient reports that he does not have any appetite and everything taste like metal.  Is using plastic utensils.  Reports he is able to eat cereal, chicken noodle soup.  Does not like oral nutrition supplements and did not like them before taste alterations.     Medications: reviewed   Labs: reviewed   Anthropometrics:   Height: 74 inches Weight: 213 lb today 227 lb on 08/15/20 250 lb on 7/15 BMI: 26 15% weight loss in the last 2 1/2 months   NUTRITION DIAGNOSIS: Inadequate oral intake related to cancer related treatment side effects as evidenced by poor appetite and 15% weight loss in the last 2 1/2 months   INTERVENTION:  Discussed strategies to help with taste changes.  Handout provided.  Encouraged baking soda, salt water rinse.   Contact information provided   MONITORING, EVALUATION, GOAL: weight trends, intake   Next Visit: Monday, October 11 after radiation  Khush Pasion B. Zenia Resides, [place], [place] Registered Dietitian [phone] (mobile)

## 2020-09-29 NOTE — H&P (View-Only) (Signed)
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 CTA showed no pulmonary embolism but a 3.8 x 3.8 cm medial right lower lobe mass abutting the right paravertebral pleural margin. No involvement of adjacent vertebral bodies and consistent with primary lung malignancy. He then presented to ER with symptoms of increasing weakness over the right side. He underwent MRI brain with and without contrast at [facility] which showed a solitary metastatic deposit in the left medial frontal lobe with surrounding vasogenic edema. Also had a CT abdomen which showed large area of heterogeneous attenuation within the lumen of the urinary bladder. While this may represent a large amount of blood products the presence of urinary bladder mass not excluded.Patient taken for cystoscopy and TURBT which did not show any bladder mass  PET CT scan showed 3.7 cm right lower lobe mass with an SUV of 30.7. The mass abuts the visceral pleura without definite pleural invasion. No hypermetabolic adenopathy or evidence of distant metastatic disease. Patient underwent bronchoscopy with [name] which showed non-small cell lung cancer favoradenocarcinoma  Patient underwent resection of the  solitary left frontal lobe brain mass which was also consistent with TTF-1 positive lung adenocarcinoma.However repeat MRI brain showed possible Concern for new brain metastases and therefore patient received whole brain radiation.  Plan is for weekly carbotaxol chemotherapy followed by maintenance durvalumab.Omniseqtesting showed high tumor mutational burden and PD-L1 of 70%. No other actionable mutations noted.  Interval history- reports feeling fatigued. Says he has a metallic taste in his mouth most of the day and he is unable to eat much. He does not like the taste of regular ensure. Denies significant nausea.  Port access has been an issue today. It is neither flushing nor drawing blood  ECOG PS- 2 Pain scale- 0  Review of systems- Review of Systems  Constitutional: Positive for malaise/fatigue. Negative for chills, fever and weight loss.       Metallic taste in mouth  HENT: Negative for congestion, ear discharge and nosebleeds.   Eyes: Negative for blurred vision.  Respiratory: Negative for cough, hemoptysis, sputum production, shortness of breath and wheezing.   Cardiovascular: Negative for chest pain, palpitations, orthopnea and claudication.  Gastrointestinal: Negative for abdominal pain, blood in stool, constipation, diarrhea, heartburn, melena, nausea and vomiting.  Genitourinary: Negative for dysuria, flank pain, frequency, hematuria and urgency.  Musculoskeletal: Negative for back pain, joint pain and myalgias.  Skin: Negative for rash.  Neurological: Negative for dizziness, tingling, focal weakness, seizures, weakness and headaches.  Endo/Heme/Allergies: Does not bruise/bleed easily.  Psychiatric/Behavioral: Negative for depression and suicidal ideas. The patient does not have insomnia.       Allergies  Allergen Reactions  . Penicillins Other (See Comments)    unknown     Past Medical History:  Diagnosis Date  . Diabetes mellitus  without complication ([place])     . Hyperlipidemia   . Hypertension   . Lesion of brain   . Lung cancer ([place])   . Mass of lung      Past Surgical History:  Procedure Laterality Date  . 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 COLONOSCOPY    . CYSTOSCOPY W/ RETROGRADES Bilateral 07/14/2020   Procedure: CYSTOSCOPY WITH RETROGRADE PYELOGRAM;  Surgeon: Hollice Espy, MD;  Location: [facility];  Service: Urology;  Laterality: Bilateral;  . PORTA CATH INSERTION N/A 08/03/2020   Procedure: PORTA CATH INSERTION;  Surgeon: Katha Cabal, MD;  Location: [place] [facility];  Service: Cardiovascular;  Laterality: N/A;  . VIDEO BRONCHOSCOPY WITH ENDOBRONCHIAL NAVIGATION N/A 07/01/2020   Procedure: VIDEO BRONCHOSCOPY WITH ENDOBRONCHIAL NAVIGATION;  Surgeon: [name], MD;  Location: [facility] OR[facility];  Service: Thoracic;  Laterality: N/A;  . VIDEO BRONCHOSCOPY WITH ENDOBRONCHIAL ULTRASOUND N/A 07/01/2020   Procedure: VIDEO BRONCHOSCOPY WITH ENDOBRONCHIAL ULTRASOUND;  Surgeon: [name], MD;  Location: [facility];  Service: Thoracic;  Laterality: N/A;    Social History   Socioeconomic History  . Marital status: Divorced    Spouse name: Not on file  . Number of children: Not on file  . Years of education: Not on file  . Highest education level: Not on file  Occupational History  . Not on file  Tobacco Use  . Smoking status: Former Smoker    Packs/day: 2.00    Years: 32.00    Pack years: 64.00    Types: Cigarettes    Quit date: 06/21/2020    Years since quitting: 0.2  . Smokeless tobacco: Never Used  Vaping Use  . Vaping Use: Never used  Substance and Sexual Activity  . Alcohol use: No    Alcohol/week: 0.0 standard drinks  . Drug use: Not Currently    Types: Marijuana  . Sexual activity: Not on file  Other Topics Concern  . Not on file  Social History Narrative  . Not on file   Social Determinants of Health   Financial Resource Strain:   . Difficulty of Paying Living Expenses: Not on file  Food Insecurity:   .  Worried About Charity fundraiser in the Last Year: Not on file  . Ran Out of Food in the Last Year: Not on file  Transportation Needs:   . Lack of Transportation (Medical): Not on file  . Lack of Transportation (Non-Medical): Not on file  Physical Activity:   . Days of Exercise per Week: Not on file  . Minutes of Exercise per Session: Not on file  Stress:   . Feeling of Stress : Not on file  Social Connections:   . Frequency of Communication with Friends and Family: Not on file  . Frequency of Social Gatherings with Friends and Family: Not on file  . Attends Religious Services: Not on file  . Active Member of Clubs or Organizations: Not on file  . Attends Archivist Meetings: Not on file  . Marital Status: Not on file  Intimate Partner Violence:   . Fear of Current or Ex-Partner: Not on file  . Emotionally Abused: Not on file  . Physically Abused: Not on file  . Sexually Abused: Not on file    Family History  Problem Relation Age of Onset  . Diabetes Sister   . Diabetes Brother      Current Outpatient Medications:  .  atorvastatin (LIPITOR) 10 MG tablet, Take 10 mg by mouth  every morning. , Disp: , Rfl:  .  calcium carbonate (TUMS EX) 750 MG chewable tablet, Chew 1 tablet by mouth 3 (three) times daily as needed for heartburn., Disp: , Rfl:  .  [employer] SENNA PLUS 8.6-50 MG tablet, Take 2 tablets by mouth at bedtime as needed for mild constipation. , Disp: , Rfl:  .  dexamethasone (DECADRON) 4 MG tablet, Take 1 tablet (4 mg total) by mouth 2 (two) times daily. (Patient not taking: Reported on 09/29/2020), Disp: 60 tablet, Rfl: 0 .  gabapentin (NEURONTIN) 300 MG capsule, Take 900 mg by mouth 3 (three) times daily., Disp: , Rfl:  .  gemfibrozil (LOPID) 600 MG tablet, Take 300 mg by mouth 2 (two) times daily. , Disp: , Rfl:  .  lidocaine-prilocaine (EMLA) cream, Apply to affected area once, Disp: 30 g, Rfl: 3 .  LORazepam (ATIVAN) 0.5 MG tablet, Take 1 tablet (0.5 mg total)  by mouth every 8 (eight) hours as needed for anxiety. (Patient not taking: Reported on 09/06/2020), Disp: 30 tablet, Rfl: 0 .  metFORMIN (GLUCOPHAGE-XR) 500 MG 24 hr tablet, Take 500 mg by mouth in the morning and at bedtime. , Disp: , Rfl:  .  ondansetron (ZOFRAN) 4 MG tablet, Take 4 mg by mouth every 8 (eight) hours as needed.  (Patient not taking: Reported on 09/06/2020), Disp: , Rfl:  .  ondansetron (ZOFRAN) 8 MG tablet, Take 1 tablet (8 mg total) by mouth 2 (two) times daily as needed for refractory nausea / vomiting. Start on day 3 after chemo. (Patient not taking: Reported on 08/18/2020), Disp: 30 tablet, Rfl: 1 .  oxyCODONE (OXY IR/ROXICODONE) 5 MG immediate release tablet, Take 1 tablet (5 mg total) by mouth every 6 (six) hours as needed for severe pain. (Patient not taking: Reported on 09/06/2020), Disp: 30 tablet, Rfl: 0 .  pantoprazole (PROTONIX) 20 MG tablet, Take 1 tablet (20 mg total) by mouth daily. (Patient not taking: Reported on 09/06/2020), Disp: 30 tablet, Rfl: 1 .  polyethylene glycol powder (GLYCOLAX/MIRALAX) 17 GM/SCOOP powder, Take by mouth. (Patient not taking: Reported on 08/18/2020), Disp: , Rfl:  .  potassium chloride SA (KLOR-CON) 20 MEQ tablet, Take 1 tablet (20 mEq total) by mouth daily., Disp: 14 tablet, Rfl: 0 .  prochlorperazine (COMPAZINE) 10 MG tablet, Take 1 tablet (10 mg total) by mouth every 6 (six) hours as needed (Nausea or vomiting). (Patient not taking: Reported on 08/03/2020), Disp: 30 tablet, Rfl: 1 No current facility-administered medications for this visit.  Facility-Administered Medications Ordered in Other Visits:  .  heparin lock flush 100 unit/mL, 500 Units, Intravenous, Once, Brahmanday, Govinda R, MD .  sodium chloride flush (NS) 0.9 % injection 10 mL, 10 mL, Intravenous, Once, Rogue Bussing, Elisha Headland, MD   Physical Exam Constitutional:      General: He is not in acute distress.    Comments: Appears fatigued  Cardiovascular:     Rate and Rhythm: Normal  rate and regular rhythm.     Heart sounds: Normal heart sounds.  Pulmonary:     Effort: Pulmonary effort is normal.     Breath sounds: Normal breath sounds.  Abdominal:     General: Bowel sounds are normal.     Palpations: Abdomen is soft.  Musculoskeletal:     Cervical back: Normal range of motion.  Skin:    General: Skin is warm and dry.  Neurological:     Mental Status: He is alert and oriented to person, place, and time.  CMP Latest Ref Rng & Units 09/29/2020  Glucose 70 - 99 mg/dL 138(H)  BUN 6 - 20 mg/dL 11  Creatinine 0.61 - 1.24 mg/dL 0.55(L)  Sodium 135 - 145 mmol/L 132(L)  Potassium 3.5 - 5.1 mmol/L 3.3(L)  Chloride 98 - 111 mmol/L 97(L)  CO2 22 - 32 mmol/L 24  Calcium 8.9 - 10.3 mg/dL 9.0  Total Protein 6.5 - 8.1 g/dL 7.2  Total Bilirubin 0.3 - 1.2 mg/dL 1.2  Alkaline Phos 38 - 126 U/L 68  AST 15 - 41 U/L 23  ALT 0 - 44 U/L 54(H)   CBC Latest Ref Rng & Units 09/29/2020  WBC 4.0 - 10.5 K/uL 2.4(L)  Hemoglobin 13.0 - 17.0 g/dL 13.2  Hematocrit 39 - 52 % 35.8(L)  Platelets 150 - 400 K/uL 286    No images are attached to the encounter.  DG Fluoro Guide CV Line Right  Result Date: 09/29/2020 INDICATION: Redness and swelling around port. EXAM: PORT CHECK MEDICATIONS: None. ANESTHESIA/SEDATION: None. FLUOROSCOPY TIME:  Fluoroscopy Time: 0 minutes minutes 30 seconds (11.2 mGy). COMPLICATIONS: None immediate. PROCEDURE: Port check was performed under fluoroscopic guidance using nonionic contrast. No flow could be demonstrated into the needle tip and port. Question is the needle tip well positioned. Reaccess should be considered. If further evaluation is needed after reaccess repeat port check can be performed. IMPRESSION: No flow could be demonstrated into the needle tip and port. Questions needle tip well position. Reaccess should be considered. Further evaluation is needed after reassess repeat port check can be performed. These results will be called to the ordering  clinician or representative by the Radiologist Assistant, and communication documented in the PACS or [employer]. Electronically Signed   By: Marcello Moores  Register   On: 09/29/2020 14:09     Assessment and plan- Patient is a 59 y.o. male with stage IVacT2 cN0 cM1 adenocarcinoma of the right lung with isolated left frontal lobe metastasess/p resection of brain met.This was followed by whole brain radiation as there was concern for new areas of brain metastases.  He is here for on treatment assessment prior to cycle 3 of carbo taxol chemotherapy  Chemo induced neutropenia: This is week 3 of chemotherapy.wbc is trending low with ANC 1.3. He will get chemotherapy today. However her has 4 more cycles to go and I am concerned his neutropenia may cause interruptions in his treatment. He cannot get neulasta with RT and weekly chemo. I would like to give 1-2 doses of weekly neupogen to get him through chemo/RT without interruptions based on his [place]. Will attempt insurance approval for the same  Metallic taste in mouth: no mucositis. Denies any nausea. He is also meeting nutrition today. I have encouraged him to try ensure clear if he does not like regular ensure.  He will receive 1L IVF today. Port will need dye study if it can be flushed but if that doesn't work, vascular surgery referral  Hypokalemia- will send prescription for PO K   I will see him next week as scheduled.  Visit Diagnosis 1. Encounter for antineoplastic chemotherapy   2. Chemotherapy induced neutropenia (HCC)   3. Malignant neoplasm of lower lobe of right lung ([place])   4. Hypokalemia      Dr. Randa Evens, MD, MPH [facility] [phone] 09/29/2020 9:02 PM

## 2020-09-29 NOTE — Progress Notes (Signed)
MD aware of ANC 1.3, HR 108 (rechecked 95), K+3.3.  MD at chair side to see patient for complaints of loss of appetite, "everything taste like metal," and 6 lb weight loss since last appointment.  MD order for po K+ and to proceed with treatment.

## 2020-09-29 NOTE — Telephone Encounter (Signed)
Radiologist staff states that they can't do the dye study because can't get contrast to go in. Dr  Janese Banks notified and was to take the port needle out and staff did. Call vascular who put it in and Dr. Ronalee Belts was contacted and he will have Tyler Gallagher call me with a date and time and they will try to do it tomorrow. Will call pt when the date and time is provided

## 2020-09-29 NOTE — Progress Notes (Signed)
  Oncology Nurse Navigator Documentation  Navigator Location: [facility] (09/29/20 1200)   )Navigator Encounter Type: Treatment (09/29/20 1200)                       Treatment Phase: Treatment (09/29/20 1200) Barriers/Navigation Needs: No Barriers At This Time;No Needs;No Questions (09/29/20 1200)   Interventions: None Required (09/29/20 1200)       spoke with patient while receiving chemotherapy today. Pt stated that he has been struggling with loss of taste at this time and has been having a difficult time finding foods that he enjoys. Stated that he was supposed to see Joli today but I did not see him on her schedule. Referral placed today. Pt did not have any other questions or needs at this time. Instructed pt to call with any further questions or needs. Pt verbalized understanding.               Time Spent with Patient: 30 (09/29/20 1200)

## 2020-09-30 ENCOUNTER — Ambulatory Visit
Admission: RE | Admit: 2020-09-30 | Discharge: 2020-09-30 | Disposition: A | Discharge status: Home / Self Care | Payer: Medicare HMO | Attending: Vascular Surgery | Admitting: Vascular Surgery

## 2020-09-30 ENCOUNTER — Inpatient Hospital Stay: Payer: Medicare HMO | Attending: Oncology

## 2020-09-30 ENCOUNTER — Ambulatory Visit: Payer: Medicare HMO

## 2020-09-30 ENCOUNTER — Other Ambulatory Visit: Payer: Self-pay

## 2020-09-30 ENCOUNTER — Ambulatory Visit
Admission: RE | Admit: 2020-09-30 | Discharge: 2020-09-30 | Disposition: A | Discharge status: Home / Self Care | Payer: Medicare HMO | Source: Ambulatory Visit | Attending: Radiation Oncology | Admitting: Radiation Oncology

## 2020-09-30 ENCOUNTER — Other Ambulatory Visit
Admission: RE | Admit: 2020-09-30 | Discharge: 2020-09-30 | Disposition: A | Discharge status: Home / Self Care | Payer: Medicare HMO | Source: Ambulatory Visit | Attending: Vascular Surgery | Admitting: Vascular Surgery

## 2020-09-30 DIAGNOSIS — E876 Hypokalemia: Secondary | ICD-10-CM | POA: Insufficient documentation

## 2020-09-30 DIAGNOSIS — Z20822 Contact with and (suspected) exposure to covid-19: Secondary | ICD-10-CM | POA: Insufficient documentation

## 2020-09-30 DIAGNOSIS — Z5111 Encounter for antineoplastic chemotherapy: Secondary | ICD-10-CM | POA: Insufficient documentation

## 2020-09-30 DIAGNOSIS — Z510 Encounter for antineoplastic radiation therapy: Secondary | ICD-10-CM | POA: Diagnosis not present

## 2020-09-30 DIAGNOSIS — E860 Dehydration: Secondary | ICD-10-CM | POA: Insufficient documentation

## 2020-09-30 DIAGNOSIS — R000 Tachycardia, unspecified: Secondary | ICD-10-CM | POA: Insufficient documentation

## 2020-09-30 DIAGNOSIS — C7931 Secondary malignant neoplasm of brain: Secondary | ICD-10-CM | POA: Insufficient documentation

## 2020-09-30 DIAGNOSIS — Z79899 Other long term (current) drug therapy: Secondary | ICD-10-CM | POA: Insufficient documentation

## 2020-09-30 DIAGNOSIS — Z01812 Encounter for preprocedural laboratory examination: Secondary | ICD-10-CM | POA: Diagnosis not present

## 2020-09-30 DIAGNOSIS — Z833 Family history of diabetes mellitus: Secondary | ICD-10-CM | POA: Insufficient documentation

## 2020-09-30 DIAGNOSIS — R931 Abnormal findings on diagnostic imaging of heart and coronary circulation: Secondary | ICD-10-CM | POA: Insufficient documentation

## 2020-09-30 DIAGNOSIS — E119 Type 2 diabetes mellitus without complications: Secondary | ICD-10-CM | POA: Insufficient documentation

## 2020-09-30 DIAGNOSIS — E785 Hyperlipidemia, unspecified: Secondary | ICD-10-CM | POA: Insufficient documentation

## 2020-09-30 DIAGNOSIS — C3431 Malignant neoplasm of lower lobe, right bronchus or lung: Secondary | ICD-10-CM | POA: Diagnosis not present

## 2020-09-30 DIAGNOSIS — R197 Diarrhea, unspecified: Secondary | ICD-10-CM | POA: Insufficient documentation

## 2020-09-30 DIAGNOSIS — C3491 Malignant neoplasm of unspecified part of right bronchus or lung: Secondary | ICD-10-CM | POA: Diagnosis not present

## 2020-09-30 DIAGNOSIS — T829XXA Unspecified complication of cardiac and vascular prosthetic device, implant and graft, initial encounter: Secondary | ICD-10-CM

## 2020-09-30 DIAGNOSIS — I10 Essential (primary) hypertension: Secondary | ICD-10-CM | POA: Insufficient documentation

## 2020-09-30 DIAGNOSIS — Z7952 Long term (current) use of systemic steroids: Secondary | ICD-10-CM | POA: Insufficient documentation

## 2020-09-30 DIAGNOSIS — Z87891 Personal history of nicotine dependence: Secondary | ICD-10-CM | POA: Insufficient documentation

## 2020-09-30 DIAGNOSIS — Z7984 Long term (current) use of oral hypoglycemic drugs: Secondary | ICD-10-CM | POA: Insufficient documentation

## 2020-09-30 LAB — SARS CORONAVIRUS 2 (TAT 6-24 HRS): SARS Coronavirus 2: NEGATIVE

## 2020-09-30 MED ORDER — CHLORHEXIDINE GLUCONATE CLOTH 2 % EX PADS: 6.0000 | MEDICATED_PAD | Freq: Every day | CUTANEOUS | Status: DC

## 2020-09-30 MED ORDER — CLINDAMYCIN PHOSPHATE 300 MG/50ML IV SOLN: 300.0000 mg | Freq: Once | INTRAVENOUS | Status: DC

## 2020-09-30 MED ORDER — HYDROMORPHONE HCL 1 MG/ML IJ SOLN: 1.0000 mg | Freq: Once | INTRAMUSCULAR | Status: DC | PRN

## 2020-09-30 MED ORDER — SODIUM CHLORIDE 0.9 % IV SOLN
Freq: Once | INTRAVENOUS | Status: DC
  Filled 2020-09-30: qty 2

## 2020-09-30 MED ORDER — SODIUM CHLORIDE 0.9 % IV SOLN: INTRAVENOUS | Status: DC

## 2020-09-30 MED ORDER — FAMOTIDINE 20 MG PO TABS: 40.0000 mg | ORAL_TABLET | Freq: Once | ORAL | Status: DC | PRN

## 2020-09-30 MED ORDER — MIDAZOLAM HCL 2 MG/ML PO SYRP: 8.0000 mg | ORAL_SOLUTION | Freq: Once | ORAL | Status: DC | PRN

## 2020-09-30 MED ORDER — ONDANSETRON HCL 4 MG/2ML IJ SOLN: 4.0000 mg | Freq: Four times a day (QID) | INTRAMUSCULAR | Status: DC | PRN

## 2020-09-30 MED ORDER — METHYLPREDNISOLONE SODIUM SUCC 125 MG IJ SOLR: 125.0000 mg | Freq: Once | INTRAMUSCULAR | Status: DC | PRN

## 2020-09-30 MED ORDER — DIPHENHYDRAMINE HCL 50 MG/ML IJ SOLN: 50.0000 mg | Freq: Once | INTRAMUSCULAR | Status: DC | PRN

## 2020-10-03 ENCOUNTER — Other Ambulatory Visit (INDEPENDENT_AMBULATORY_CARE_PROVIDER_SITE_OTHER): Payer: Self-pay | Admitting: Nurse Practitioner

## 2020-10-03 ENCOUNTER — Ambulatory Visit
Admission: RE | Admit: 2020-10-03 | Discharge: 2020-10-03 | Disposition: A | Discharge status: Home / Self Care | Payer: Medicare HMO | Source: Ambulatory Visit | Attending: Radiation Oncology | Admitting: Radiation Oncology

## 2020-10-03 ENCOUNTER — Inpatient Hospital Stay: Payer: Medicare HMO | Admitting: Hospice and Palliative Medicine

## 2020-10-03 ENCOUNTER — Inpatient Hospital Stay: Payer: Medicare HMO

## 2020-10-03 ENCOUNTER — Encounter
Admission: RE | Disposition: A | Discharge status: Home / Self Care | Payer: Self-pay | Source: Home / Self Care | Attending: Vascular Surgery

## 2020-10-03 ENCOUNTER — Ambulatory Visit
Admission: RE | Admit: 2020-10-03 | Discharge: 2020-10-03 | Disposition: A | Discharge status: Home / Self Care | Payer: Medicare HMO | Attending: Vascular Surgery | Admitting: Vascular Surgery

## 2020-10-03 ENCOUNTER — Other Ambulatory Visit: Payer: Self-pay

## 2020-10-03 ENCOUNTER — Encounter: Payer: Self-pay | Admitting: Vascular Surgery

## 2020-10-03 DIAGNOSIS — Z7984 Long term (current) use of oral hypoglycemic drugs: Secondary | ICD-10-CM | POA: Insufficient documentation

## 2020-10-03 DIAGNOSIS — T451X5A Adverse effect of antineoplastic and immunosuppressive drugs, initial encounter: Secondary | ICD-10-CM | POA: Diagnosis not present

## 2020-10-03 DIAGNOSIS — Z452 Encounter for adjustment and management of vascular access device: Secondary | ICD-10-CM | POA: Diagnosis not present

## 2020-10-03 DIAGNOSIS — D701 Agranulocytosis secondary to cancer chemotherapy: Secondary | ICD-10-CM | POA: Diagnosis not present

## 2020-10-03 DIAGNOSIS — I10 Essential (primary) hypertension: Secondary | ICD-10-CM | POA: Diagnosis not present

## 2020-10-03 DIAGNOSIS — Z880 Allergy status to penicillin: Secondary | ICD-10-CM | POA: Insufficient documentation

## 2020-10-03 DIAGNOSIS — Z87891 Personal history of nicotine dependence: Secondary | ICD-10-CM | POA: Insufficient documentation

## 2020-10-03 DIAGNOSIS — E785 Hyperlipidemia, unspecified: Secondary | ICD-10-CM | POA: Diagnosis not present

## 2020-10-03 DIAGNOSIS — E119 Type 2 diabetes mellitus without complications: Secondary | ICD-10-CM | POA: Diagnosis not present

## 2020-10-03 DIAGNOSIS — Z510 Encounter for antineoplastic radiation therapy: Secondary | ICD-10-CM | POA: Diagnosis not present

## 2020-10-03 DIAGNOSIS — T829XXA Unspecified complication of cardiac and vascular prosthetic device, implant and graft, initial encounter: Secondary | ICD-10-CM

## 2020-10-03 DIAGNOSIS — E876 Hypokalemia: Secondary | ICD-10-CM | POA: Diagnosis not present

## 2020-10-03 DIAGNOSIS — C7931 Secondary malignant neoplasm of brain: Secondary | ICD-10-CM | POA: Insufficient documentation

## 2020-10-03 DIAGNOSIS — C3490 Malignant neoplasm of unspecified part of unspecified bronchus or lung: Secondary | ICD-10-CM

## 2020-10-03 DIAGNOSIS — Z79899 Other long term (current) drug therapy: Secondary | ICD-10-CM | POA: Insufficient documentation

## 2020-10-03 DIAGNOSIS — C3431 Malignant neoplasm of lower lobe, right bronchus or lung: Secondary | ICD-10-CM | POA: Insufficient documentation

## 2020-10-03 DIAGNOSIS — C3491 Malignant neoplasm of unspecified part of right bronchus or lung: Secondary | ICD-10-CM | POA: Diagnosis not present

## 2020-10-03 HISTORY — PX: PORTA CATH INSERTION: CATH118285

## 2020-10-03 LAB — GLUCOSE, CAPILLARY: Glucose-Capillary: 126 mg/dL — ABNORMAL HIGH (ref 70–99)

## 2020-10-03 SURGERY — PORTA CATH INSERTION
Anesthesia: Moderate Sedation

## 2020-10-03 MED ORDER — MIDAZOLAM HCL 5 MG/5ML IJ SOLN
INTRAMUSCULAR | Status: AC
  Filled 2020-10-03: qty 5

## 2020-10-03 MED ORDER — FENTANYL CITRATE (PF) 100 MCG/2ML IJ SOLN
INTRAMUSCULAR | Status: DC | PRN
Start: 2020-10-03 — End: 2020-10-03
  Administered 2020-10-03: 50 ug via INTRAVENOUS
  Administered 2020-10-03: 25 ug via INTRAVENOUS

## 2020-10-03 MED ORDER — MIDAZOLAM HCL 2 MG/ML PO SYRP: 8.0000 mg | ORAL_SOLUTION | Freq: Once | ORAL | Status: DC | PRN

## 2020-10-03 MED ORDER — MIDAZOLAM HCL 2 MG/2ML IJ SOLN
INTRAMUSCULAR | Status: DC | PRN
  Administered 2020-10-03: 2 mg via INTRAVENOUS
  Administered 2020-10-03: 1 mg via INTRAVENOUS

## 2020-10-03 MED ORDER — CLINDAMYCIN PHOSPHATE 300 MG/50ML IV SOLN: 300.0000 mg | Freq: Once | INTRAVENOUS | Status: AC

## 2020-10-03 MED ORDER — DIPHENHYDRAMINE HCL 50 MG/ML IJ SOLN: 50.0000 mg | Freq: Once | INTRAMUSCULAR | Status: DC | PRN

## 2020-10-03 MED ORDER — CHLORHEXIDINE GLUCONATE CLOTH 2 % EX PADS
6.0000 | MEDICATED_PAD | Freq: Every day | CUTANEOUS | Status: DC
  Administered 2020-10-03: 2 via TOPICAL

## 2020-10-03 MED ORDER — SODIUM CHLORIDE 0.9 % IV SOLN
Freq: Once | INTRAVENOUS | Status: DC
  Filled 2020-10-03: qty 2

## 2020-10-03 MED ORDER — SODIUM CHLORIDE 0.9 % IV SOLN: INTRAVENOUS | Status: DC

## 2020-10-03 MED ORDER — FAMOTIDINE 20 MG PO TABS: 40.0000 mg | ORAL_TABLET | Freq: Once | ORAL | Status: DC | PRN

## 2020-10-03 MED ORDER — CLINDAMYCIN PHOSPHATE 300 MG/50ML IV SOLN
INTRAVENOUS | Status: AC
  Administered 2020-10-03: 300 mg via INTRAVENOUS
  Filled 2020-10-03: qty 50

## 2020-10-03 MED ORDER — FENTANYL CITRATE (PF) 100 MCG/2ML IJ SOLN
INTRAMUSCULAR | Status: DC
Start: 2020-10-03 — End: 2020-10-03
  Filled 2020-10-03: qty 2

## 2020-10-03 MED ORDER — METHYLPREDNISOLONE SODIUM SUCC 125 MG IJ SOLR: 125.0000 mg | Freq: Once | INTRAMUSCULAR | Status: DC | PRN

## 2020-10-03 MED ORDER — HYDROMORPHONE HCL 1 MG/ML IJ SOLN: 1.0000 mg | Freq: Once | INTRAMUSCULAR | Status: DC | PRN

## 2020-10-03 MED ORDER — ONDANSETRON HCL 4 MG/2ML IJ SOLN: 4.0000 mg | Freq: Four times a day (QID) | INTRAMUSCULAR | Status: DC | PRN

## 2020-10-03 SURGICAL SUPPLY — 10 items
ADH SKN CLS APL DERMABOND .7 (GAUZE/BANDAGES/DRESSINGS) ×1
DERMABOND ADVANCED (GAUZE/BANDAGES/DRESSINGS) ×2
DERMABOND ADVANCED .7 DNX12 (GAUZE/BANDAGES/DRESSINGS) ×1 IMPLANT
KIT PORT POWER 8FR ISP CVUE (Port) ×3 IMPLANT
PACK ANGIOGRAPHY (CUSTOM PROCEDURE TRAY) ×3 IMPLANT
SUT MNCRL AB 4-0 PS2 18 (SUTURE) ×6 IMPLANT
SUT PROLENE 0 CT 1 30 (SUTURE) ×3 IMPLANT
SUT VIC AB 3-0 SH 27 (SUTURE) ×3
SUT VIC AB 3-0 SH 27X BRD (SUTURE) ×1 IMPLANT
WIRE MAGIC TOR.035 180C (WIRE) ×3 IMPLANT

## 2020-10-03 NOTE — Interval H&P Note (Signed)
History and Physical Interval Note:  10/03/2020 8:49 AM  Tyler Gallagher  has presented today for surgery, with the diagnosis of Porta Cath Exchange  Porta Cath Malfunction   Pt to have Covid test on 09-30-20.  The various methods of treatment have been discussed with the patient and family. After consideration of risks, benefits and other options for treatment, the patient has consented to  Procedure(s): PORTA CATH INSERTION (N/A) as a surgical intervention.  The patient's history has been reviewed, patient examined, no change in status, stable for surgery.  I have reviewed the patient's chart and labs.  Questions were answered to the patient's satisfaction.     Leotis Pain

## 2020-10-03 NOTE — Op Note (Signed)
[facility]       Operative Note  Date: 10/03/2020  Preoperative diagnosis:  1.  Lung cancer, poorly functioning port  Postoperative diagnosis:  Same as above  Procedures: #1.  Removal of existing right jugular Port-A-Cath #2. Fluoroscopic guidance for placement of catheter. #3. Placement of CT compatible Port-A-Cath, right internal jugular vein.  Surgeon: Leotis Pain, MD.   Anesthesia: Local with moderate conscious sedation for approximately 37 minutes using 3 mg of Versed and 75 mcg of Fentanyl  Fluoroscopy time: less than 1 minute  Contrast used: 0  Estimated blood loss: 5 cc  Indication for the procedure:  The patient is a 59 y.o.male with lung cancer and a poorly functioning existing Port-A-Cath.  The catheter was fairly steep in the neck and it was not flushing well or running well.  The decision was made to exchange this port tunneled more laterally.  The patient needs a Port-A-Cath for durable venous access, chemotherapy, lab draws, and CT scans. We are asked to place this. Risks and benefits were discussed and informed consent was obtained.  Description of procedure: The patient was brought to the vascular and interventional radiology suite.  Moderate conscious sedation was administered throughout the procedure during a face to face encounter with the patient with my supervision of the RN administering medicines and monitoring the patient's vital signs, pulse oximetry, telemetry and mental status throughout from the start of the procedure until the patient was taken to the recovery room. The right neck chest and shoulder were sterilely prepped and draped, and a sterile surgical field was created.  I anesthetized the area overlying the existing port in the chest as well as the catheter portion in the neck.  I reopened the previous incision in the chest and dissected out the port.  The catheter was then transected from the port itself which was removed from  the body.  A Magic torque wire was then placed through the catheter and the catheter was removed in its entirety.  A small incision was created in the neck and the wire was captured.  It was then pulled out through the jugular incision and the peel-away sheath was placed over this wire and the wire and dilator were removed.  I then anesthetized an area under the clavicle approximately 1-2 fingerbreadths. A transverse incision was created and an inferior pocket was created with electrocautery and blunt dissection. The port was then brought onto the field, placed into the pocket and secured to the chest wall with 2 Prolene sutures. The catheter was connected to the port and tunneled from the subclavicular incision to the access site. Fluoroscopic guidance was then used to cut the catheter to an appropriate length. The catheter was then placed through the peel-away sheath and the peel-away sheath was removed. The catheter tip was parked in excellent location under fluorocoscopic guidance in the cavoatrial junction. The pocket was then irrigated with antibiotic impregnated saline and the wound was closed with a running 3-0 Vicryl and a 4-0 Monocryl. The access incision was closed with a single 4-0 Monocryl.  The port removal incision was also closed with a running 3-0 Vicryl and a 4-0 Monocryl.  The Huber needle was used to withdraw blood and flush the port with heparinized saline. Dermabond was then placed as a dressing. The patient tolerated the procedure well and was taken to the recovery room in stable condition.   Leotis Pain 10/03/2020 9:39 AM   This note  was created with Dragon Medical transcription system. Any errors in dictation are purely unintentional.

## 2020-10-03 NOTE — Discharge Instructions (Signed)
Moderate Conscious Sedation, Adult, Care After These instructions provide you with information about caring for yourself after your procedure. Your health care provider may also give you more specific instructions. Your treatment has been planned according to current medical practices, but problems sometimes occur. Call your health care provider if you have any problems or questions after your procedure. What can I expect after the procedure? After your procedure, it is common:  To feel sleepy for several hours.  To feel clumsy and have poor balance for several hours.  To have poor judgment for several hours.  To vomit if you eat too soon. Follow these instructions at home: For at least 24 hours after the procedure:   Do not: ? Participate in activities where you could fall or become injured. ? Drive. ? Use heavy machinery. ? Drink alcohol. ? Take sleeping pills or medicines that cause drowsiness. ? Make important decisions or sign legal documents. ? Take care of children on your own.  Rest. Eating and drinking  Follow the diet recommended by your health care provider.  If you vomit: ? Drink water, juice, or soup when you can drink without vomiting. ? Make sure you have little or no nausea before eating solid foods. General instructions  Have a responsible adult stay with you until you are awake and alert.  Take over-the-counter and prescription medicines only as told by your health care provider.  If you smoke, do not smoke without supervision.  Keep all follow-up visits as told by your health care provider. This is important. Contact a health care provider if:  You keep feeling nauseous or you keep vomiting.  You feel light-headed.  You develop a rash.  You have a fever. Get help right away if:  You have trouble breathing. This information is not intended to replace advice given to you by your health care provider. Make sure you discuss any questions you have  with your health care provider. Document Revised: 11/29/2017 Document Reviewed: 04/07/2016 Elsevier Patient Education  [place] Insertion, Care After This sheet gives you information about how to care for yourself after your procedure. Your health care provider may also give you more specific instructions. If you have problems or questions, contact your health care provider. What can I expect after the procedure? After the procedure, it is common to have:  Discomfort at the port insertion site.  Bruising on the skin over the port. This should improve over 3-4 days. Follow these instructions at home: [facility] care  After your port is placed, you will get a manufacturer's information card. The card has information about your port. Keep this card with you at all times.  Take care of the port as told by your health care provider. Ask your health care provider if you or a family member can get training for taking care of the port at home. A home health care nurse may also take care of the port.  Make sure to remember what type of port you have. Incision care      Follow instructions from your health care provider about how to take care of your port insertion site. Make sure you: ? Wash your hands with soap and water before and after you change your bandage (dressing). If soap and water are not available, use hand sanitizer. ? Change your dressing as told by your health care provider. ? Leave stitches (sutures), skin glue, or adhesive strips in place. These skin closures may need to stay  in place for 2 weeks or longer. If adhesive strip edges start to loosen and curl up, you may trim the loose edges. Do not remove adhesive strips completely unless your health care provider tells you to do that.  Check your port insertion site every day for signs of infection. Check for: ? Redness, swelling, or pain. ? Fluid or blood. ? Warmth. ? Pus or a bad smell. Activity  Return  to your normal activities as told by your health care provider. Ask your health care provider what activities are safe for you.  Do not lift anything that is heavier than 10 lb (4.5 kg), or the limit that you are told, until your health care provider says that it is safe. General instructions  Take over-the-counter and prescription medicines only as told by your health care provider.  Do not take baths, swim, or use a hot tub until your health care provider approves. Ask your health care provider if you may take showers. You may only be allowed to take sponge baths.  Do not drive for 24 hours if you were given a sedative during your procedure.  Wear a medical alert bracelet in case of an emergency. This will tell any health care providers that you have a port.  Keep all follow-up visits as told by your health care provider. This is important. Contact a health care provider if:  You cannot flush your port with saline as directed, or you cannot draw blood from the port.  You have a fever or chills.  You have redness, swelling, or pain around your port insertion site.  You have fluid or blood coming from your port insertion site.  Your port insertion site feels warm to the touch.  You have pus or a bad smell coming from the port insertion site. Get help right away if:  You have chest pain or shortness of breath.  You have bleeding from your port that you cannot control. Summary  Take care of the port as told by your health care provider. Keep the manufacturer's information card with you at all times.  Change your dressing as told by your health care provider.  Contact a health care provider if you have a fever or chills or if you have redness, swelling, or pain around your port insertion site.  Keep all follow-up visits as told by your health care provider. This information is not intended to replace advice given to you by your health care provider. Make sure you discuss any  questions you have with your health care provider. Document Revised: 07/15/2018 Document Reviewed: 07/15/2018 Elsevier Patient Education  [place].

## 2020-10-04 ENCOUNTER — Ambulatory Visit: Payer: Medicare HMO

## 2020-10-04 ENCOUNTER — Encounter: Payer: Self-pay | Admitting: *Deleted

## 2020-10-05 ENCOUNTER — Other Ambulatory Visit: Payer: Self-pay | Admitting: Oncology

## 2020-10-05 ENCOUNTER — Inpatient Hospital Stay: Payer: Medicare HMO

## 2020-10-05 ENCOUNTER — Inpatient Hospital Stay (HOSPITAL_BASED_OUTPATIENT_CLINIC_OR_DEPARTMENT_OTHER): Payer: Medicare HMO | Admitting: Oncology

## 2020-10-05 ENCOUNTER — Ambulatory Visit
Admission: RE | Admit: 2020-10-05 | Discharge: 2020-10-05 | Disposition: A | Discharge status: Home / Self Care | Payer: Medicare HMO | Source: Ambulatory Visit | Attending: Radiation Oncology | Admitting: Radiation Oncology

## 2020-10-05 ENCOUNTER — Other Ambulatory Visit: Payer: Self-pay

## 2020-10-05 VITALS — BP 121/88 | HR 115 | Resp 18

## 2020-10-05 DIAGNOSIS — Z833 Family history of diabetes mellitus: Secondary | ICD-10-CM | POA: Diagnosis not present

## 2020-10-05 DIAGNOSIS — Z510 Encounter for antineoplastic radiation therapy: Secondary | ICD-10-CM | POA: Diagnosis not present

## 2020-10-05 DIAGNOSIS — R000 Tachycardia, unspecified: Secondary | ICD-10-CM | POA: Diagnosis not present

## 2020-10-05 DIAGNOSIS — I10 Essential (primary) hypertension: Secondary | ICD-10-CM | POA: Diagnosis not present

## 2020-10-05 DIAGNOSIS — E860 Dehydration: Secondary | ICD-10-CM | POA: Diagnosis not present

## 2020-10-05 DIAGNOSIS — C3491 Malignant neoplasm of unspecified part of right bronchus or lung: Secondary | ICD-10-CM | POA: Diagnosis not present

## 2020-10-05 DIAGNOSIS — Z87891 Personal history of nicotine dependence: Secondary | ICD-10-CM | POA: Diagnosis not present

## 2020-10-05 DIAGNOSIS — E119 Type 2 diabetes mellitus without complications: Secondary | ICD-10-CM | POA: Diagnosis not present

## 2020-10-05 DIAGNOSIS — C3490 Malignant neoplasm of unspecified part of unspecified bronchus or lung: Secondary | ICD-10-CM

## 2020-10-05 DIAGNOSIS — E876 Hypokalemia: Secondary | ICD-10-CM | POA: Diagnosis not present

## 2020-10-05 DIAGNOSIS — C3431 Malignant neoplasm of lower lobe, right bronchus or lung: Secondary | ICD-10-CM | POA: Diagnosis not present

## 2020-10-05 DIAGNOSIS — T451X5A Adverse effect of antineoplastic and immunosuppressive drugs, initial encounter: Secondary | ICD-10-CM | POA: Diagnosis not present

## 2020-10-05 DIAGNOSIS — Z7984 Long term (current) use of oral hypoglycemic drugs: Secondary | ICD-10-CM | POA: Diagnosis not present

## 2020-10-05 DIAGNOSIS — Z5111 Encounter for antineoplastic chemotherapy: Secondary | ICD-10-CM | POA: Diagnosis not present

## 2020-10-05 DIAGNOSIS — Z7952 Long term (current) use of systemic steroids: Secondary | ICD-10-CM | POA: Diagnosis not present

## 2020-10-05 DIAGNOSIS — R197 Diarrhea, unspecified: Secondary | ICD-10-CM | POA: Diagnosis not present

## 2020-10-05 DIAGNOSIS — E785 Hyperlipidemia, unspecified: Secondary | ICD-10-CM | POA: Diagnosis not present

## 2020-10-05 DIAGNOSIS — Z79899 Other long term (current) drug therapy: Secondary | ICD-10-CM | POA: Diagnosis not present

## 2020-10-05 DIAGNOSIS — C7931 Secondary malignant neoplasm of brain: Secondary | ICD-10-CM | POA: Diagnosis not present

## 2020-10-05 DIAGNOSIS — K521 Toxic gastroenteritis and colitis: Secondary | ICD-10-CM | POA: Diagnosis not present

## 2020-10-05 DIAGNOSIS — R931 Abnormal findings on diagnostic imaging of heart and coronary circulation: Secondary | ICD-10-CM | POA: Diagnosis not present

## 2020-10-05 LAB — CBC WITH DIFFERENTIAL/PLATELET
Abs Immature Granulocytes: 0.03 10*3/uL (ref 0.00–0.07)
Basophils Absolute: 0 10*3/uL (ref 0.0–0.1)
Basophils Relative: 1 %
Eosinophils Absolute: 0 10*3/uL (ref 0.0–0.5)
Eosinophils Relative: 1 %
HCT: 35.1 % — ABNORMAL LOW (ref 39.0–52.0)
Hemoglobin: 12.8 g/dL — ABNORMAL LOW (ref 13.0–17.0)
Immature Granulocytes: 1 %
Lymphocytes Relative: 23 %
Lymphs Abs: 0.7 10*3/uL (ref 0.7–4.0)
MCH: 32.4 pg (ref 26.0–34.0)
MCHC: 36.5 g/dL — ABNORMAL HIGH (ref 30.0–36.0)
MCV: 88.9 fL (ref 80.0–100.0)
Monocytes Absolute: 0.4 10*3/uL (ref 0.1–1.0)
Monocytes Relative: 12 %
Neutro Abs: 1.9 10*3/uL (ref 1.7–7.7)
Neutrophils Relative %: 62 %
Platelets: 251 10*3/uL (ref 150–400)
RBC: 3.95 MIL/uL — ABNORMAL LOW (ref 4.22–5.81)
RDW: 16.6 % — ABNORMAL HIGH (ref 11.5–15.5)
WBC: 3.1 10*3/uL — ABNORMAL LOW (ref 4.0–10.5)
nRBC: 0 % (ref 0.0–0.2)

## 2020-10-05 LAB — COMPREHENSIVE METABOLIC PANEL
ALT: 91 U/L — ABNORMAL HIGH (ref 0–44)
AST: 48 U/L — ABNORMAL HIGH (ref 15–41)
Albumin: 3.5 g/dL (ref 3.5–5.0)
Alkaline Phosphatase: 74 U/L (ref 38–126)
Anion gap: 11 (ref 5–15)
BUN: 9 mg/dL (ref 6–20)
CO2: 25 mmol/L (ref 22–32)
Calcium: 8.5 mg/dL — ABNORMAL LOW (ref 8.9–10.3)
Chloride: 94 mmol/L — ABNORMAL LOW (ref 98–111)
Creatinine, Ser: 0.56 mg/dL — ABNORMAL LOW (ref 0.61–1.24)
GFR calc non Af Amer: >60 mL/min (ref >60)
Glucose, Bld: 142 mg/dL — ABNORMAL HIGH (ref 70–99)
Potassium: 2.6 mmol/L — CL (ref 3.5–5.1)
Sodium: 130 mmol/L — ABNORMAL LOW (ref 135–145)
Total Bilirubin: 1.2 mg/dL (ref 0.3–1.2)
Total Protein: 7.2 g/dL (ref 6.5–8.1)

## 2020-10-05 LAB — GASTROINTESTINAL PANEL BY PCR, STOOL (REPLACES STOOL CULTURE)

## 2020-10-05 MED ORDER — SODIUM CHLORIDE 0.9% FLUSH
10.0000 mL | INTRAVENOUS | Status: DC | PRN
  Administered 2020-10-05: 10 mL via INTRAVENOUS
  Filled 2020-10-05: qty 10

## 2020-10-05 MED ORDER — SODIUM CHLORIDE 0.9 % IV SOLN
Freq: Once | INTRAVENOUS | Status: AC
  Filled 2020-10-05: qty 250

## 2020-10-05 MED ORDER — POTASSIUM CHLORIDE CRYS ER 10 MEQ PO TBCR
20.0000 meq | EXTENDED_RELEASE_TABLET | Freq: Once | ORAL | Status: AC
  Administered 2020-10-05: 20 meq via ORAL

## 2020-10-05 MED ORDER — POTASSIUM CHLORIDE 20 MEQ/100ML IV SOLN
20.0000 meq | Freq: Once | INTRAVENOUS | Status: AC
  Administered 2020-10-05: 20 meq via INTRAVENOUS

## 2020-10-05 MED ORDER — ONDANSETRON HCL 4 MG/2ML IJ SOLN
8.0000 mg | Freq: Once | INTRAMUSCULAR | Status: AC
  Administered 2020-10-05: 8 mg via INTRAVENOUS
  Filled 2020-10-05: qty 4

## 2020-10-05 MED ORDER — HEPARIN SOD (PORK) LOCK FLUSH 100 UNIT/ML IV SOLN
500.0000 [IU] | Freq: Once | INTRAVENOUS | Status: AC
  Administered 2020-10-05: 500 [IU] via INTRAVENOUS
  Filled 2020-10-05: qty 5

## 2020-10-05 NOTE — Progress Notes (Signed)
Patient in [facility] clinic today for weakness, dehydration. He had a new port placed on Monday of this week.. Reviewed imaging of correct placement. Port site is bruised and still tender. Accessed easily with immediate blood return. Pt states his appetite dropped after being NPO for his port placement. He has had an episode of vomiting this week. He is also complaining of having liquid stools. Pt receiving zofran and 1 liter of NS. Potassium resulted at 2.6.. It is 3:50pm. Pt agreed to stay for another hour to get 20 Potassium IV over 1 hour. Will give another 20 mEq orally while he is here in clinic. Pt was able to provide a stool specimen while in clinic this afternoon. Port deaccessed per pt request. He returns to see Dr Janese Banks in the am. He states "I feel better" at time of discharge.

## 2020-10-05 NOTE — Progress Notes (Signed)
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  Chief complaint/ Reason for visit- tachycardia  Heme/Onc history:  Oncology History  Malignant neoplasm of lower lobe of right lung ([place])  07/07/2020 Cancer Staging   Staging form: Lung, AJCC 8th Edition - Clinical stage from 07/07/2020: Stage IVA (cT2a, cN0, cM1b) - Signed by [name], MD on 07/08/2020   07/08/2020 Initial Diagnosis   Malignant neoplasm of lower lobe of right lung ([place])   08/15/2020 -  Chemotherapy   The patient had dexamethasone (DECADRON) 4 MG tablet, 8 mg, Oral, Daily, 1 of 1 cycle, Start date: 07/26/2020, End date: 08/15/2020 palonosetron (ALOXI) injection 0.25 mg, 0.25 mg, Intravenous,  Once, 4 of 5 cycles Administration: 0.25 mg (08/15/2020), 0.25 mg (09/22/2020), 0.25 mg (09/29/2020), 0.25 mg (09/15/2020) CARBOplatin (PARAPLATIN) 300 mg in sodium chloride 0.9 % 250 mL chemo infusion, 300 mg (100 % of original dose 300 mg), Intravenous,  Once, 4 of 5 cycles Dose modification:   (original dose 300 mg, Cycle 1) Administration: 300 mg (09/22/2020), 300 mg (09/29/2020), 300 mg (09/15/2020) PACLitaxel (TAXOL) 186 mg in sodium chloride 0.9 % 250 mL chemo infusion (</= 80mg /m2), 80 mg/m2 = 186 mg, Intravenous,  Once, 4 of 5 cycles Administration: 186 mg (09/22/2020), 186 mg (09/29/2020), 186 mg (09/15/2020)  for chemotherapy treatment.     Interval history-   Tyler Gallagher is a 59 year old male with past medical history significant for TIA type 2 diabetes, hyperlipidemia, tobacco abuse, obesity, above-knee amputation and recent diagnosis of right lower lung mass.  He initially presented to [facility] ED for right upper  extremity weakness x3 to 4 days.  Work-up included CT abdomen pelvis, brain MRI of brain which unfortunately revealed a solitary brain mass.  He was discharged home on oral steroids with short-term follow-up with [name].   PET scan (06/29/2020) showed hypermetabolic right lower lobe mass compatible with malignancy no hypermetabolic adenopathy or evidence of metastatic disease.    Had bronchoscopy with [name] on which showed non-small cell lung cancer favoring adenocarcinoma.    Initial consult with Dr. Donella Stade on 07/07/2020 and the plan was for [facility] to the brain lesion.  He was also offered SBRT to his right lower lobe.   Had TURBT for diagnostic purposes with Dr. Erlene Quan- negative for malignancy  Had craniectomy with Dr. [name] on 07/18/2020.  Plan is for concurrent carbotaxol for [redacted] weeks along with radiation.  Unfortunately, carbotaxol currently on hold secondary to additional lesion discovered on his brain.  Received whole brain radiation.   Reinitiated carbotaxol with XRT on 09/22/2020.  Has received 3 cycles total.  Today, patient presents for radiation and states he is not feeling well.  He has not been eating or drinking well since Monday.  On Monday he had his port removed with a new one placed by Dr. Lucky Cowboy secondary to port malfunction.  He is concerned that his port is infected because it is red and tender to the touch.  He denies recent fever. He also feels very weak and tired.   Breathing is fair.  Oxygen saturations low 90s appear to be his baseline.  Reports a rash on bilateral arms that is  not itchy X 3-4 days. No chest pain or palpitations. Having liquid stools several times per day for the last 2 days. No recent antibiotic use.  Last treatment was about 2 weeks ago.  Receiving daily radiation to his lung.  ECOG FS:3 - Symptomatic, >50% confined to bed  Review of systems- Review of Systems  Constitutional: Positive for malaise/fatigue. Negative for chills, fever and weight  loss.  HENT: Negative for congestion, ear pain and tinnitus.   Eyes: Negative.  Negative for blurred vision and double vision.  Respiratory: Negative.  Negative for cough, sputum production and shortness of breath.   Cardiovascular: Negative.  Negative for chest pain, palpitations and leg swelling.  Gastrointestinal: Negative.  Negative for abdominal pain, constipation, diarrhea, nausea and vomiting.  Genitourinary: Negative for dysuria, frequency and urgency.  Musculoskeletal: Negative for back pain and falls.  Skin: Positive for rash.       Port site bruising.   Neurological: Positive for weakness. Negative for headaches.  Endo/Heme/Allergies: Negative.  Does not bruise/bleed easily.  Psychiatric/Behavioral: Negative.  Negative for depression. The patient is not nervous/anxious and does not have insomnia.      Current treatment-status post 3 cycles of carbotaxol with radiation  Allergies  Allergen Reactions  . Penicillins Other (See Comments)    unknown     Past Medical History:  Diagnosis Date  . Diabetes mellitus without complication ([place])   . Hyperlipidemia   . Hypertension   . Lesion of brain   . Lung cancer ([place])   . Mass of lung      Past Surgical History:  Procedure Laterality Date  . 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 COLONOSCOPY    . CYSTOSCOPY W/ RETROGRADES Bilateral 07/14/2020   Procedure: CYSTOSCOPY WITH RETROGRADE PYELOGRAM;  Surgeon: Hollice Espy, MD;  Location: [facility] ORS;  Service: Urology;  Laterality: Bilateral;  . PORTA CATH INSERTION N/A 08/03/2020   Procedure: PORTA CATH INSERTION;  Surgeon: Katha Cabal, MD;  Location: [place] CV LAB;  Service: Cardiovascular;  Laterality: N/A;  . PORTA CATH INSERTION N/A 10/03/2020   Procedure: PORTA CATH INSERTION;  Surgeon: Algernon Huxley, MD;  Location: [place] CV LAB;  Service: Cardiovascular;  Laterality: N/A;  . VIDEO BRONCHOSCOPY WITH ENDOBRONCHIAL NAVIGATION N/A 07/01/2020   Procedure: VIDEO  BRONCHOSCOPY WITH ENDOBRONCHIAL NAVIGATION;  Surgeon: [name], MD;  Location: [facility] ORS;  Service: Thoracic;  Laterality: N/A;  . VIDEO BRONCHOSCOPY WITH ENDOBRONCHIAL ULTRASOUND N/A 07/01/2020   Procedure: VIDEO BRONCHOSCOPY WITH ENDOBRONCHIAL ULTRASOUND;  Surgeon: [name], MD;  Location: [facility] ORS;  Service: Thoracic;  Laterality: N/A;    Social History   Socioeconomic History  . Marital status: Divorced    Spouse name: Not on file  . Number of children: Not on file  . Years of education: Not on file  . Highest education level: Not on file  Occupational History  . Not on file  Tobacco Use  . Smoking status: Former Smoker    Packs/day: 2.00    Years: 32.00    Pack years: 64.00    Types: Cigarettes    Quit date: 06/21/2020    Years since quitting: 0.2  . Smokeless tobacco: Never Used  Vaping Use  . Vaping Use: Never used  Substance and Sexual Activity  . Alcohol use: No    Alcohol/week: 0.0 standard drinks  . Drug use: Not Currently    Types: Marijuana  . Sexual activity: Not on file  Other Topics Concern  .  Not on file  Social History Narrative   Lives at home with girlfriend   Social Determinants of Health   Financial Resource Strain:   . Difficulty of Paying Living Expenses: Not on file  Food Insecurity:   . Worried About Charity fundraiser in the Last Year: Not on file  . Ran Out of Food in the Last Year: Not on file  Transportation Needs:   . Lack of Transportation (Medical): Not on file  . Lack of Transportation (Non-Medical): Not on file  Physical Activity:   . Days of Exercise per Week: Not on file  . Minutes of Exercise per Session: Not on file  Stress:   . Feeling of Stress : Not on file  Social Connections:   . Frequency of Communication with Friends and Family: Not on file  . Frequency of Social Gatherings with Friends and Family: Not on file  . Attends Religious Services: Not on file  . Active Member of Clubs or Organizations: Not on  file  . Attends Archivist Meetings: Not on file  . Marital Status: Not on file  Intimate Partner Violence:   . Fear of Current or Ex-Partner: Not on file  . Emotionally Abused: Not on file  . Physically Abused: Not on file  . Sexually Abused: Not on file    Family History  Problem Relation Age of Onset  . Diabetes Sister   . Diabetes Brother      Current Outpatient Medications:  .  atorvastatin (LIPITOR) 10 MG tablet, Take 10 mg by mouth every morning.  (Patient not taking: Reported on 10/03/2020), Disp: , Rfl:  .  calcium carbonate (TUMS EX) 750 MG chewable tablet, Chew 1 tablet by mouth 3 (three) times daily as needed for heartburn., Disp: , Rfl:  .  [employer] SENNA PLUS 8.6-50 MG tablet, Take 2 tablets by mouth at bedtime as needed for mild constipation. , Disp: , Rfl:  .  dexamethasone (DECADRON) 4 MG tablet, Take 1 tablet (4 mg total) by mouth 2 (two) times daily. (Patient not taking: Reported on 09/29/2020), Disp: 60 tablet, Rfl: 0 .  gabapentin (NEURONTIN) 300 MG capsule, Take 900 mg by mouth 3 (three) times daily., Disp: , Rfl:  .  gemfibrozil (LOPID) 600 MG tablet, Take 300 mg by mouth 2 (two) times daily.  (Patient not taking: Reported on 10/03/2020), Disp: , Rfl:  .  lidocaine-prilocaine (EMLA) cream, Apply to affected area once, Disp: 30 g, Rfl: 3 .  LORazepam (ATIVAN) 0.5 MG tablet, Take 1 tablet (0.5 mg total) by mouth every 8 (eight) hours as needed for anxiety. (Patient not taking: Reported on 09/06/2020), Disp: 30 tablet, Rfl: 0 .  metFORMIN (GLUCOPHAGE-XR) 500 MG 24 hr tablet, Take 500 mg by mouth in the morning and at bedtime. , Disp: , Rfl:  .  ondansetron (ZOFRAN) 4 MG tablet, Take 4 mg by mouth every 8 (eight) hours as needed.  (Patient not taking: Reported on 09/06/2020), Disp: , Rfl:  .  ondansetron (ZOFRAN) 8 MG tablet, Take 1 tablet (8 mg total) by mouth 2 (two) times daily as needed for refractory nausea / vomiting. Start on day 3 after chemo. (Patient not taking:  Reported on 08/18/2020), Disp: 30 tablet, Rfl: 1 .  oxyCODONE (OXY IR/ROXICODONE) 5 MG immediate release tablet, Take 1 tablet (5 mg total) by mouth every 6 (six) hours as needed for severe pain. (Patient not taking: Reported on 09/06/2020), Disp: 30 tablet, Rfl: 0 .  pantoprazole (PROTONIX)  20 MG tablet, Take 1 tablet (20 mg total) by mouth daily. (Patient not taking: Reported on 09/06/2020), Disp: 30 tablet, Rfl: 1 .  polyethylene glycol powder (GLYCOLAX/MIRALAX) 17 GM/SCOOP powder, Take by mouth. (Patient not taking: Reported on 08/18/2020), Disp: , Rfl:  .  potassium chloride SA (KLOR-CON) 20 MEQ tablet, Take 1 tablet (20 mEq total) by mouth daily., Disp: 14 tablet, Rfl: 0 .  prochlorperazine (COMPAZINE) 10 MG tablet, Take 1 tablet (10 mg total) by mouth every 6 (six) hours as needed (Nausea or vomiting). (Patient not taking: Reported on 08/03/2020), Disp: 30 tablet, Rfl: 1 No current facility-administered medications for this visit.  Facility-Administered Medications Ordered in Other Visits:  .  heparin lock flush 100 unit/mL, 500 Units, Intravenous, Once, Brahmanday, Govinda R, MD .  sodium chloride flush (NS) 0.9 % injection 10 mL, 10 mL, Intravenous, Once, Cammie Sickle, MD  Physical exam:  There were no vitals filed for this visit. Physical Exam Constitutional:      Appearance: Normal appearance.  HENT:     Head: Normocephalic and atraumatic.  Eyes:     Pupils: Pupils are equal, round, and reactive to light.  Cardiovascular:     Rate and Rhythm: Normal rate and regular rhythm.     Heart sounds: Normal heart sounds. No murmur heard.   Pulmonary:     Effort: Pulmonary effort is normal.     Breath sounds: Normal breath sounds. No wheezing.  Abdominal:     General: Bowel sounds are normal. There is no distension.     Palpations: Abdomen is soft.     Tenderness: There is no abdominal tenderness.  Musculoskeletal:        General: Normal range of motion.     Cervical back: Normal  range of motion.  Skin:    General: Skin is warm and dry.     Coloration: Skin is pale.     Findings: Rash present.  Neurological:     Mental Status: He is alert and oriented to person, place, and time.  Psychiatric:        Judgment: Judgment normal.    Media Information   Document Information  Photos  Port  10/05/2020 13:37  Attached To:  [name]  Source Information  Jacquelin Hawking, NP  [facility]     CMP Latest Ref Rng & Units 10/05/2020  Glucose 70 - 99 mg/dL 142(H)  BUN 6 - 20 mg/dL 9  Creatinine 0.61 - 1.24 mg/dL 0.56(L)  Sodium 135 - 145 mmol/L 130(L)  Potassium 3.5 - 5.1 mmol/L 2.6(LL)  Chloride 98 - 111 mmol/L 94(L)  CO2 22 - 32 mmol/L 25  Calcium 8.9 - 10.3 mg/dL 8.5(L)  Total Protein 6.5 - 8.1 g/dL 7.2  Total Bilirubin 0.3 - 1.2 mg/dL 1.2  Alkaline Phos 38 - 126 U/L 74  AST 15 - 41 U/L 48(H)  ALT 0 - 44 U/L 91(H)   CBC Latest Ref Rng & Units 10/05/2020  WBC 4.0 - 10.5 K/uL 3.1(L)  Hemoglobin 13.0 - 17.0 g/dL 12.8(L)  Hematocrit 39 - 52 % 35.1(L)  Platelets 150 - 400 K/uL 251    No images are attached to the encounter.  PERIPHERAL VASCULAR CATHETERIZATION  Result Date: 10/03/2020 See op note  DG Fluoro Guide CV Line Right  Result Date: 09/29/2020 INDICATION: Redness and swelling around port. EXAM: PORT CHECK MEDICATIONS: None. ANESTHESIA/SEDATION: None. FLUOROSCOPY TIME:  Fluoroscopy Time: 0 minutes minutes 30 seconds (11.2 mGy). COMPLICATIONS: None immediate. PROCEDURE:  Port check was performed under fluoroscopic guidance using nonionic contrast. No flow could be demonstrated into the needle tip and port. Question is the needle tip well positioned. Reaccess should be considered. If further evaluation is needed after reaccess repeat port check can be performed. IMPRESSION: No flow could be demonstrated into the needle tip and port. Questions needle tip well position. Reaccess should be considered. Further evaluation is needed after  reassess repeat port check can be performed. These results will be called to the ordering clinician or representative by the Radiologist Assistant, and communication documented in the PACS or [employer]. Electronically Signed   By: Marcello Moores  Register   On: 09/29/2020 14:09   Assessment and plan- Patient is a 59 y.o. male with pmh significant for lung cancer with mets to the brain who complains of new onset rash on bilateral arms, bruising around his newly placed port and tachycardia.  Has chronic shortness of breath secondary to lung cancer and COPD.  Lung Cancer with brain mets-status post 3 cycles of carbotaxol plus XRT.  Also had craniotomy and whole brain radiation after additional lesions were discovered.  Scheduled to receive cycle 4 tomorrow.  Rash on bilateral upper extremities-secondary to treatment.  Nonbothersome at this time.  Can apply topical steroids and creams as needed. Rx kenalog cream sent to pharmacy.   Port-A-Cath bruising-recently had old port removed and new port placed Monday.  Port evaluated and accessed while in clinic.  Good blood return.  No signs of infection.  We will continue to monitor.  Picture of port sent to Dr. Lucky Cowboy.   Dehydration-patient is tachycardic on assessment.  Heart rate 130.  We will go ahead and give IV fluids while in clinic today.  Given low potassium and tachycardia will check EKG.  Diarrhea-secondary to chemo treatments.  Collect stool sample to rule out infection.  Once this is negative he can start on Imodium/Lomotil.  Hypokalemia- K 2.6. secondary to diarrhea. Will replace with 20 meq IV potassium and 20 meq oral potassium. RX potassium 20 meq BID X 5 days.   Plan: Lab work-CBC, CMP (K 2.6) Collect stool samples(C. difficile, GI panel)-negative IV fluids 1 L 8 mg zofran Start imodium. EKG  Disposition- RTC tomorrow for lab work, MD assessment and cycle 4 carbo Taxol plus XRT.   Visit Diagnosis 1. Hypokalemia   2. Malignant neoplasm  of lung, unspecified laterality, unspecified part of lung ([place])   3. Dehydration     Patient expressed understanding and was in agreement with this plan. He also understands that He can call clinic at any time with any questions, concerns, or complaints.   Greater than 50% was spent in counseling and coordination of care with this patient including but not limited to discussion of the relevant topics above (See A&P) including, but not limited to diagnosis and management of acute and chronic medical conditions.   Thank you for allowing me to participate in the care of this very pleasant patient.    Jacquelin Hawking, NP [place] [facility] Cell - [phone] Pager- [phone] 10/06/2020 6:52 AM

## 2020-10-06 ENCOUNTER — Inpatient Hospital Stay: Payer: Medicare HMO

## 2020-10-06 ENCOUNTER — Ambulatory Visit
Admission: RE | Admit: 2020-10-06 | Discharge: 2020-10-06 | Disposition: A | Discharge status: Home / Self Care | Payer: Medicare HMO | Source: Ambulatory Visit | Attending: Radiation Oncology | Admitting: Radiation Oncology

## 2020-10-06 ENCOUNTER — Encounter: Payer: Self-pay | Admitting: Oncology

## 2020-10-06 ENCOUNTER — Inpatient Hospital Stay (HOSPITAL_BASED_OUTPATIENT_CLINIC_OR_DEPARTMENT_OTHER): Payer: Medicare HMO | Admitting: Oncology

## 2020-10-06 VITALS — BP 115/83 | HR 120 | Temp 97.7°F | Resp 16 | Wt 210.8 lb

## 2020-10-06 DIAGNOSIS — E876 Hypokalemia: Secondary | ICD-10-CM | POA: Diagnosis not present

## 2020-10-06 DIAGNOSIS — Z5111 Encounter for antineoplastic chemotherapy: Secondary | ICD-10-CM

## 2020-10-06 DIAGNOSIS — R197 Diarrhea, unspecified: Secondary | ICD-10-CM | POA: Diagnosis not present

## 2020-10-06 DIAGNOSIS — Z510 Encounter for antineoplastic radiation therapy: Secondary | ICD-10-CM | POA: Diagnosis not present

## 2020-10-06 DIAGNOSIS — C3431 Malignant neoplasm of lower lobe, right bronchus or lung: Secondary | ICD-10-CM

## 2020-10-06 DIAGNOSIS — T451X5A Adverse effect of antineoplastic and immunosuppressive drugs, initial encounter: Secondary | ICD-10-CM

## 2020-10-06 DIAGNOSIS — C7931 Secondary malignant neoplasm of brain: Secondary | ICD-10-CM | POA: Diagnosis not present

## 2020-10-06 DIAGNOSIS — R931 Abnormal findings on diagnostic imaging of heart and coronary circulation: Secondary | ICD-10-CM | POA: Diagnosis not present

## 2020-10-06 DIAGNOSIS — Z87891 Personal history of nicotine dependence: Secondary | ICD-10-CM | POA: Diagnosis not present

## 2020-10-06 DIAGNOSIS — E860 Dehydration: Secondary | ICD-10-CM | POA: Diagnosis not present

## 2020-10-06 DIAGNOSIS — K521 Toxic gastroenteritis and colitis: Secondary | ICD-10-CM

## 2020-10-06 DIAGNOSIS — R000 Tachycardia, unspecified: Secondary | ICD-10-CM | POA: Diagnosis not present

## 2020-10-06 DIAGNOSIS — C3491 Malignant neoplasm of unspecified part of right bronchus or lung: Secondary | ICD-10-CM | POA: Diagnosis not present

## 2020-10-06 LAB — CBC WITH DIFFERENTIAL/PLATELET
Abs Immature Granulocytes: 0.02 10*3/uL (ref 0.00–0.07)
Basophils Absolute: 0.1 10*3/uL (ref 0.0–0.1)
Basophils Relative: 2 %
Eosinophils Absolute: 0 10*3/uL (ref 0.0–0.5)
Eosinophils Relative: 1 %
HCT: 34 % — ABNORMAL LOW (ref 39.0–52.0)
Hemoglobin: 12.5 g/dL — ABNORMAL LOW (ref 13.0–17.0)
Immature Granulocytes: 1 %
Lymphocytes Relative: 21 %
Lymphs Abs: 0.6 10*3/uL — ABNORMAL LOW (ref 0.7–4.0)
MCH: 32.5 pg (ref 26.0–34.0)
MCHC: 36.8 g/dL — ABNORMAL HIGH (ref 30.0–36.0)
MCV: 88.3 fL (ref 80.0–100.0)
Monocytes Absolute: 0.4 10*3/uL (ref 0.1–1.0)
Monocytes Relative: 13 %
Neutro Abs: 1.9 10*3/uL (ref 1.7–7.7)
Neutrophils Relative %: 62 %
Platelets: 231 10*3/uL (ref 150–400)
RBC: 3.85 MIL/uL — ABNORMAL LOW (ref 4.22–5.81)
RDW: 16.5 % — ABNORMAL HIGH (ref 11.5–15.5)
WBC: 3.1 10*3/uL — ABNORMAL LOW (ref 4.0–10.5)
nRBC: 0 % (ref 0.0–0.2)

## 2020-10-06 LAB — COMPREHENSIVE METABOLIC PANEL
ALT: 104 U/L — ABNORMAL HIGH (ref 0–44)
AST: 46 U/L — ABNORMAL HIGH (ref 15–41)
Albumin: 3.5 g/dL (ref 3.5–5.0)
Alkaline Phosphatase: 72 U/L (ref 38–126)
Anion gap: 10 (ref 5–15)
BUN: 8 mg/dL (ref 6–20)
CO2: 23 mmol/L (ref 22–32)
Calcium: 8.5 mg/dL — ABNORMAL LOW (ref 8.9–10.3)
Chloride: 99 mmol/L (ref 98–111)
Creatinine, Ser: 0.59 mg/dL — ABNORMAL LOW (ref 0.61–1.24)
GFR calc non Af Amer: >60 mL/min (ref >60)
Glucose, Bld: 122 mg/dL — ABNORMAL HIGH (ref 70–99)
Potassium: 2.8 mmol/L — ABNORMAL LOW (ref 3.5–5.1)
Sodium: 132 mmol/L — ABNORMAL LOW (ref 135–145)
Total Bilirubin: 1.3 mg/dL — ABNORMAL HIGH (ref 0.3–1.2)
Total Protein: 7 g/dL (ref 6.5–8.1)

## 2020-10-06 LAB — C DIFFICILE QUICK SCREEN W PCR REFLEX
C Diff antigen: NEGATIVE
C Diff interpretation: NOT DETECTED
C Diff toxin: NEGATIVE

## 2020-10-06 MED ORDER — SODIUM CHLORIDE 0.9 % IV SOLN
300.0000 mg | Freq: Once | INTRAVENOUS | Status: AC
  Administered 2020-10-06: 300 mg via INTRAVENOUS
  Filled 2020-10-06: qty 30

## 2020-10-06 MED ORDER — HEPARIN SOD (PORK) LOCK FLUSH 100 UNIT/ML IV SOLN
500.0000 [IU] | Freq: Once | INTRAVENOUS | Status: AC
  Administered 2020-10-06: 500 [IU] via INTRAVENOUS
  Filled 2020-10-06: qty 5

## 2020-10-06 MED ORDER — SODIUM CHLORIDE 0.9 % IV SOLN
Freq: Once | INTRAVENOUS | Status: AC
  Filled 2020-10-06: qty 250

## 2020-10-06 MED ORDER — POTASSIUM CHLORIDE IN NACL 20-0.9 MEQ/L-% IV SOLN
Freq: Once | INTRAVENOUS | Status: AC
  Filled 2020-10-06: qty 1000

## 2020-10-06 MED ORDER — SODIUM CHLORIDE 0.9 % IV SOLN
65.0000 mg/m2 | Freq: Once | INTRAVENOUS | Status: AC
  Administered 2020-10-06: 150 mg via INTRAVENOUS
  Filled 2020-10-06: qty 25

## 2020-10-06 MED ORDER — TRIAMCINOLONE ACETONIDE 0.5 % EX OINT: 1.0000 "application " | TOPICAL_OINTMENT | Freq: Two times a day (BID) | CUTANEOUS | 0 refills | Status: DC

## 2020-10-06 MED ORDER — PALONOSETRON HCL INJECTION 0.25 MG/5ML
0.2500 mg | Freq: Once | INTRAVENOUS | Status: AC
  Administered 2020-10-06: 0.25 mg via INTRAVENOUS
  Filled 2020-10-06: qty 5

## 2020-10-06 MED ORDER — DIPHENHYDRAMINE HCL 50 MG/ML IJ SOLN
50.0000 mg | Freq: Once | INTRAMUSCULAR | Status: AC
  Administered 2020-10-06: 50 mg via INTRAVENOUS
  Filled 2020-10-06: qty 1

## 2020-10-06 MED ORDER — SODIUM CHLORIDE 0.9 % IV SOLN
20.0000 mg | Freq: Once | INTRAVENOUS | Status: AC
  Administered 2020-10-06: 20 mg via INTRAVENOUS
  Filled 2020-10-06: qty 20

## 2020-10-06 MED ORDER — FAMOTIDINE IN NACL 20-0.9 MG/50ML-% IV SOLN
20.0000 mg | Freq: Once | INTRAVENOUS | Status: AC
  Administered 2020-10-06: 20 mg via INTRAVENOUS
  Filled 2020-10-06: qty 50

## 2020-10-06 MED ORDER — HEPARIN SOD (PORK) LOCK FLUSH 100 UNIT/ML IV SOLN
INTRAVENOUS | Status: AC
  Filled 2020-10-06: qty 5

## 2020-10-06 MED ORDER — SODIUM CHLORIDE 0.9% FLUSH
10.0000 mL | INTRAVENOUS | Status: DC | PRN
  Administered 2020-10-06: 10 mL via INTRAVENOUS
  Filled 2020-10-06: qty 10

## 2020-10-06 NOTE — Progress Notes (Signed)
HR 120, LFT elevated.  Taxol reduced dose.  To get IVF before chemo per MD

## 2020-10-06 NOTE — Progress Notes (Signed)
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 CTA showed no pulmonary embolism but a 3.8 x 3.8 cm medial right lower lobe mass abutting the right paravertebral pleural margin. No involvement of adjacent vertebral bodies and consistent with primary lung malignancy. He then presented to ER with symptoms of increasing weakness over the right side. He underwent MRI brain with and without contrast at [facility] which showed a solitary metastatic deposit in the left medial frontal lobe with surrounding vasogenic edema. Also had a CT abdomen which showed large area of heterogeneous attenuation within the lumen of the urinary bladder. While this may represent a large amount of blood products the presence of urinary bladder mass not excluded.Patient taken for cystoscopy and TURBT which did not show any bladder mass  PET CT scan showed 3.7 cm right lower lobe mass with an SUV of 30.7. The mass abuts the visceral pleura without definite pleural invasion. No hypermetabolic adenopathy or evidence of distant metastatic disease. Patient underwent bronchoscopy with [name] which showed non-small cell lung cancer favoradenocarcinoma  Patient underwent resection of the solitary  left frontal lobe brain mass which was also consistent with TTF-1 positive lung adenocarcinoma.However repeat MRI brain showed possible Concern for new brain metastases and therefore patient received whole brain radiation.  Plan is for weekly carbotaxol chemotherapy followed by maintenance durvalumab.Omniseqtesting showed high tumor mutational burden and PD-L1 of 70%. No other actionable mutations noted.   Interval history-patient reports feeling fatigued.  He has had diarrhea for the last 1 to 2 days.  4-5 episodes daily and watery bowel movements.  Denies any abdominal pain or blood in his stool. ECOG PS- 2 Pain scale- 0 Opioid associated constipation- no  Review of systems- Review of Systems  Constitutional: Positive for malaise/fatigue. Negative for chills, fever and weight loss.  HENT: Negative for congestion, ear discharge and nosebleeds.   Eyes: Negative for blurred vision.  Respiratory: Negative for cough, hemoptysis, sputum production, shortness of breath and wheezing.   Cardiovascular: Negative for chest pain, palpitations, orthopnea and claudication.  Gastrointestinal: Positive for diarrhea. Negative for abdominal pain, blood in stool, constipation, heartburn, melena, nausea and vomiting.  Genitourinary: Negative for dysuria, flank pain, frequency, hematuria and urgency.  Musculoskeletal: Negative for back pain, joint pain and myalgias.  Skin: Negative for rash.  Neurological: Negative for dizziness, tingling, focal weakness, seizures, weakness and headaches.  Endo/Heme/Allergies: Does not bruise/bleed easily.  Psychiatric/Behavioral: Negative for depression and suicidal ideas. The patient does not have insomnia.        Allergies  Allergen Reactions  . Penicillins Other (See Comments)    unknown     Past Medical History:  Diagnosis Date  . Diabetes mellitus without complication ([place])   . Hyperlipidemia   . Hypertension   . Lesion of brain   . Lung cancer ([place])    .  Mass of lung      Past Surgical History:  Procedure Laterality Date  . 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 COLONOSCOPY    . CYSTOSCOPY W/ RETROGRADES Bilateral 07/14/2020   Procedure: CYSTOSCOPY WITH RETROGRADE PYELOGRAM;  Surgeon: Hollice Espy, MD;  Location: [facility] ORS;  Service: Urology;  Laterality: Bilateral;  . PORTA CATH INSERTION N/A 08/03/2020   Procedure: PORTA CATH INSERTION;  Surgeon: Katha Cabal, MD;  Location: [place] [facility];  Service: Cardiovascular;  Laterality: N/A;  . PORTA CATH INSERTION N/A 10/03/2020   Procedure: PORTA CATH INSERTION;  Surgeon: Algernon Huxley, MD;  Location: [place] [facility];  Service: Cardiovascular;  Laterality: N/A;  . VIDEO BRONCHOSCOPY WITH ENDOBRONCHIAL NAVIGATION N/A 07/01/2020   Procedure: VIDEO BRONCHOSCOPY WITH ENDOBRONCHIAL NAVIGATION;  Surgeon: [name], MD;  Location: [facility] ORS;  Service: Thoracic;  Laterality: N/A;  . VIDEO BRONCHOSCOPY WITH ENDOBRONCHIAL ULTRASOUND N/A 07/01/2020   Procedure: VIDEO BRONCHOSCOPY WITH ENDOBRONCHIAL ULTRASOUND;  Surgeon: [name], MD;  Location: [facility] OR[facility];  Service: Thoracic;  Laterality: N/A;    Social History   Socioeconomic History  . Marital status: Divorced    Spouse name: Not on file  . Number of children: Not on file  . Years of education: Not on file  . Highest education level: Not on file  Occupational History  . Not on file  Tobacco Use  . Smoking status: Former Smoker    Packs/day: 2.00    Years: 32.00    Pack years: 64.00    Types: Cigarettes    Quit date: 06/21/2020    Years since quitting: 0.2  . Smokeless tobacco: Never Used  Vaping Use  . Vaping Use: Never used  Substance and Sexual Activity  . Alcohol use: No    Alcohol/week: 0.0 standard drinks  . Drug use: Not Currently    Types: Marijuana  . Sexual activity: Not on file  Other Topics Concern  . Not on file  Social History Narrative   Lives at home with girlfriend   Social Determinants of Health    Financial Resource Strain:   . Difficulty of Paying Living Expenses: Not on file  Food Insecurity:   . Worried About Charity fundraiser in the Last Year: Not on file  . Ran Out of Food in the Last Year: Not on file  Transportation Needs:   . Lack of Transportation (Medical): Not on file  . Lack of Transportation (Non-Medical): Not on file  Physical Activity:   . Days of Exercise per Week: Not on file  . Minutes of Exercise per Session: Not on file  Stress:   . Feeling of Stress : Not on file  Social Connections:   . Frequency of Communication with Friends and Family: Not on file  . Frequency of Social Gatherings with Friends and Family: Not on file  . Attends Religious Services: Not on file  . Active Member of Clubs or Organizations: Not on file  . Attends Archivist Meetings: Not on file  . Marital Status: Not on file  Intimate Partner Violence:   . Fear of Current or Ex-Partner: Not on file  . Emotionally Abused: Not on file  . Physically Abused: Not on file  . Sexually Abused: Not on file    Family History  Problem Relation Age of Onset  . Diabetes Sister   . Diabetes Brother      Current Outpatient Medications:  .  calcium carbonate (TUMS EX) 750  MG chewable tablet, Chew 1 tablet by mouth 3 (three) times daily as needed for heartburn., Disp: , Rfl:  .  [employer] SENNA PLUS 8.6-50 MG tablet, Take 2 tablets by mouth at bedtime as needed for mild constipation. , Disp: , Rfl:  .  gabapentin (NEURONTIN) 300 MG capsule, Take 900 mg by mouth 3 (three) times daily., Disp: , Rfl:  .  lidocaine-prilocaine (EMLA) cream, Apply to affected area once, Disp: 30 g, Rfl: 3 .  metFORMIN (GLUCOPHAGE-XR) 500 MG 24 hr tablet, Take 500 mg by mouth in the morning and at bedtime. , Disp: , Rfl:  .  potassium chloride SA (KLOR-CON) 20 MEQ tablet, Take 1 tablet (20 mEq total) by mouth daily., Disp: 14 tablet, Rfl: 0 .  triamcinolone ointment (KENALOG) 0.5 %, Apply 1 application topically  2 (two) times daily., Disp: 30 g, Rfl: 0 .  atorvastatin (LIPITOR) 10 MG tablet, Take 10 mg by mouth every morning.  (Patient not taking: Reported on 10/03/2020), Disp: , Rfl:  .  dexamethasone (DECADRON) 4 MG tablet, Take 1 tablet (4 mg total) by mouth 2 (two) times daily. (Patient not taking: Reported on 09/29/2020), Disp: 60 tablet, Rfl: 0 .  gemfibrozil (LOPID) 600 MG tablet, Take 300 mg by mouth 2 (two) times daily.  (Patient not taking: Reported on 10/03/2020), Disp: , Rfl:  .  LORazepam (ATIVAN) 0.5 MG tablet, Take 1 tablet (0.5 mg total) by mouth every 8 (eight) hours as needed for anxiety. (Patient not taking: Reported on 09/06/2020), Disp: 30 tablet, Rfl: 0 .  ondansetron (ZOFRAN) 4 MG tablet, Take 4 mg by mouth every 8 (eight) hours as needed.  (Patient not taking: Reported on 09/06/2020), Disp: , Rfl:  .  ondansetron (ZOFRAN) 8 MG tablet, Take 1 tablet (8 mg total) by mouth 2 (two) times daily as needed for refractory nausea / vomiting. Start on day 3 after chemo. (Patient not taking: Reported on 08/18/2020), Disp: 30 tablet, Rfl: 1 .  oxyCODONE (OXY IR/ROXICODONE) 5 MG immediate release tablet, Take 1 tablet (5 mg total) by mouth every 6 (six) hours as needed for severe pain. (Patient not taking: Reported on 09/06/2020), Disp: 30 tablet, Rfl: 0 .  pantoprazole (PROTONIX) 20 MG tablet, Take 1 tablet (20 mg total) by mouth daily. (Patient not taking: Reported on 09/06/2020), Disp: 30 tablet, Rfl: 1 .  polyethylene glycol powder (GLYCOLAX/MIRALAX) 17 GM/SCOOP powder, Take by mouth. (Patient not taking: Reported on 08/18/2020), Disp: , Rfl:  .  prochlorperazine (COMPAZINE) 10 MG tablet, Take 1 tablet (10 mg total) by mouth every 6 (six) hours as needed (Nausea or vomiting). (Patient not taking: Reported on 08/03/2020), Disp: 30 tablet, Rfl: 1 No current facility-administered medications for this visit.  Facility-Administered Medications Ordered in Other Visits:  .  heparin lock flush 100 unit/mL, 500 Units,  Intravenous, Once, Brahmanday, Govinda R, MD .  heparin lock flush 100 unit/mL, 500 Units, Intravenous, Once, [name], MD .  sodium chloride flush (NS) 0.9 % injection 10 mL, 10 mL, Intravenous, Once, Brahmanday, Govinda R, MD .  sodium chloride flush (NS) 0.9 % injection 10 mL, 10 mL, Intravenous, PRN, [name], MD, 10 mL at 10/06/20 0916  Physical exam:  Vitals:   10/06/20 0947  BP: 115/83  Pulse: (!) 120  Resp: 16  Temp: 97.7 F (36.5 C)  TempSrc: Tympanic  SpO2: 99%  Weight: 210 lb 12.8 oz (95.6 kg)   Physical Exam Constitutional:      General: He is  not in acute distress.    Comments: Appears fatigued  Cardiovascular:     Rate and Rhythm: Regular rhythm. Tachycardia present.     Heart sounds: Normal heart sounds.  Pulmonary:     Effort: Pulmonary effort is normal.     Breath sounds: Normal breath sounds.  Abdominal:     General: Bowel sounds are normal.     Palpations: Abdomen is soft.  Skin:    General: Skin is warm and dry.  Neurological:     Mental Status: He is alert and oriented to person, place, and time.      CMP Latest Ref Rng & Units 10/06/2020  Glucose 70 - 99 mg/dL 122(H)  BUN 6 - 20 mg/dL 8  Creatinine 0.61 - 1.24 mg/dL 0.59(L)  Sodium 135 - 145 mmol/L 132(L)  Potassium 3.5 - 5.1 mmol/L 2.8(L)  Chloride 98 - 111 mmol/L 99  CO2 22 - 32 mmol/L 23  Calcium 8.9 - 10.3 mg/dL 8.5(L)  Total Protein 6.5 - 8.1 g/dL 7.0  Total Bilirubin 0.3 - 1.2 mg/dL 1.3(H)  Alkaline Phos 38 - 126 U/L 72  AST 15 - 41 U/L 46(H)  ALT 0 - 44 U/L 104(H)   CBC Latest Ref Rng & Units 10/06/2020  WBC 4.0 - 10.5 K/uL 3.1(L)  Hemoglobin 13.0 - 17.0 g/dL 12.5(L)  Hematocrit 39 - 52 % 34.0(L)  Platelets 150 - 400 K/uL 231    No images are attached to the encounter.  PERIPHERAL VASCULAR CATHETERIZATION  Result Date: 10/03/2020 See op note  DG Fluoro Guide CV Line Right  Result Date: 09/29/2020 INDICATION: Redness and swelling around port. EXAM: PORT CHECK  MEDICATIONS: None. ANESTHESIA/SEDATION: None. FLUOROSCOPY TIME:  Fluoroscopy Time: 0 minutes minutes 30 seconds (11.2 mGy). COMPLICATIONS: None immediate. PROCEDURE: Port check was performed under fluoroscopic guidance using nonionic contrast. No flow could be demonstrated into the needle tip and port. Question is the needle tip well positioned. Reaccess should be considered. If further evaluation is needed after reaccess repeat port check can be performed. IMPRESSION: No flow could be demonstrated into the needle tip and port. Questions needle tip well position. Reaccess should be considered. Further evaluation is needed after reassess repeat port check can be performed. These results will be called to the ordering clinician or representative by the Radiologist Assistant, and communication documented in the PACS or [employer]. Electronically Signed   By: Marcello Moores  Register   On: 09/29/2020 14:09     Assessment and plan- Patient is a 59 y.o. male  with stage IVacT2 cN0 cM1 adenocarcinoma of the right lung with isolated left frontal lobe metastasess/p resection of brain met.This was followed by whole brain radiation as there was concern for new areas of brain metastases.He is here for on treatment assessment prior to cycle 4 of carbotaxol chemotherapy  White count is remained stable at 3.1 with an [place] of 1.9.  Counts are otherwise okay to proceed with carbotaxol chemotherapy today.  However in view of his diarrhea and fatigue I will reduce the dose of Taxol to 65 mg per metered square.  I will see him back in 1 week for cycle 5.  His radiation treatment is ending in 3 weeks and I plan to at least give him 6 if not 7 weekly cycles of carbotaxol.  Diarrhea: Likely chemotherapy-induced.  Taxol dose reduced as above.  C. difficile and GI intestinal panel PCR testing was negative.  I have recommended that he should use Imodium 2 mg every 2  hours as needed with each loose bowel movement up to 8  tablets/day.  Hypokalemia/tachycardia: Patient will receive 1 L of IV fluids today with 20 mEq of IV potassium.  Oral potassium has also been sent to his pharmacy yesterday.   Visit Diagnosis 1. Encounter for antineoplastic chemotherapy   2. Chemotherapy induced diarrhea   3. Hypokalemia   4. Malignant neoplasm of lower lobe of right lung ([place])      Dr. Randa Evens, MD, MPH [facility] at [facility] [phone] 10/06/2020 3:00 PM

## 2020-10-06 NOTE — Progress Notes (Signed)
HR 120, K 2.8, ALT 104. Per [name] patient to get 1L IVF with 20 mEq Potassium over 1 hour, then to proceed with treatment.

## 2020-10-07 ENCOUNTER — Ambulatory Visit
Admission: RE | Admit: 2020-10-07 | Discharge: 2020-10-07 | Disposition: A | Discharge status: Home / Self Care | Payer: Medicare HMO | Source: Ambulatory Visit | Attending: Radiation Oncology | Admitting: Radiation Oncology

## 2020-10-07 DIAGNOSIS — Z510 Encounter for antineoplastic radiation therapy: Secondary | ICD-10-CM | POA: Diagnosis not present

## 2020-10-07 DIAGNOSIS — C7931 Secondary malignant neoplasm of brain: Secondary | ICD-10-CM | POA: Diagnosis not present

## 2020-10-07 DIAGNOSIS — C3431 Malignant neoplasm of lower lobe, right bronchus or lung: Secondary | ICD-10-CM | POA: Diagnosis not present

## 2020-10-07 DIAGNOSIS — C3491 Malignant neoplasm of unspecified part of right bronchus or lung: Secondary | ICD-10-CM | POA: Diagnosis not present

## 2020-10-10 ENCOUNTER — Other Ambulatory Visit: Payer: Self-pay | Admitting: *Deleted

## 2020-10-10 ENCOUNTER — Ambulatory Visit
Admission: RE | Admit: 2020-10-10 | Discharge: 2020-10-10 | Disposition: A | Discharge status: Home / Self Care | Payer: Medicare HMO | Source: Ambulatory Visit | Attending: Radiation Oncology | Admitting: Radiation Oncology

## 2020-10-10 ENCOUNTER — Inpatient Hospital Stay: Payer: Medicare HMO

## 2020-10-10 DIAGNOSIS — C3431 Malignant neoplasm of lower lobe, right bronchus or lung: Secondary | ICD-10-CM | POA: Diagnosis not present

## 2020-10-10 DIAGNOSIS — Z510 Encounter for antineoplastic radiation therapy: Secondary | ICD-10-CM | POA: Diagnosis not present

## 2020-10-10 DIAGNOSIS — C3491 Malignant neoplasm of unspecified part of right bronchus or lung: Secondary | ICD-10-CM | POA: Diagnosis not present

## 2020-10-10 DIAGNOSIS — C7931 Secondary malignant neoplasm of brain: Secondary | ICD-10-CM | POA: Diagnosis not present

## 2020-10-10 NOTE — Progress Notes (Signed)
Nutrition Follow-up:  Patient with stage IV lung cancer.  Patient receiving carboplatin and weekly radiation.    Met with patient following radiation.  Patient reports that he still had a metallic taste in his mouth.  Is drinking chocolate milk mixed with regular milk for added calories and protein.  Eating eggs, some chicken noodle soup.  Overall not eating much due to taste of foods.  Reports that imodium is helping diarrhea.      Medications: reviewed  Labs: reviewed  Anthropometrics:   Weight 210 lb 12.8 oz on 10/7 decreased from 213 lb on 9/30   NUTRITION DIAGNOSIS: Inadequate oral intake continues   INTERVENTION:  Reviewed strategies to help with taste change.  Encouraged keeping mouth clean.  Patient has contact information    MONITORING, EVALUATION, GOAL: weight trends, intake   NEXT VISIT: Nov 8 phone f/u  Tyler Gallagher, [place], [place] Registered Dietitian [phone] (mobile)

## 2020-10-11 ENCOUNTER — Inpatient Hospital Stay (HOSPITAL_BASED_OUTPATIENT_CLINIC_OR_DEPARTMENT_OTHER): Payer: Medicare HMO | Admitting: Hospice and Palliative Medicine

## 2020-10-11 ENCOUNTER — Other Ambulatory Visit: Payer: Self-pay

## 2020-10-11 ENCOUNTER — Ambulatory Visit
Admission: RE | Admit: 2020-10-11 | Discharge: 2020-10-11 | Disposition: A | Discharge status: Home / Self Care | Payer: Medicare HMO | Source: Ambulatory Visit | Attending: Radiation Oncology | Admitting: Radiation Oncology

## 2020-10-11 DIAGNOSIS — Z515 Encounter for palliative care: Secondary | ICD-10-CM | POA: Diagnosis not present

## 2020-10-11 DIAGNOSIS — C3491 Malignant neoplasm of unspecified part of right bronchus or lung: Secondary | ICD-10-CM | POA: Diagnosis not present

## 2020-10-11 DIAGNOSIS — C7931 Secondary malignant neoplasm of brain: Secondary | ICD-10-CM | POA: Diagnosis not present

## 2020-10-11 DIAGNOSIS — Z510 Encounter for antineoplastic radiation therapy: Secondary | ICD-10-CM | POA: Diagnosis not present

## 2020-10-11 DIAGNOSIS — C3431 Malignant neoplasm of lower lobe, right bronchus or lung: Secondary | ICD-10-CM | POA: Diagnosis not present

## 2020-10-11 NOTE — Progress Notes (Signed)
Virtual Visit via Telephone Note  I connected with [name] on 10/11/20 at  1:45 PM EDT by telephone and verified that I am speaking with the correct person using two identifiers.   I discussed the limitations, risks, security and privacy concerns of performing an evaluation and management service by telephone and the availability of in person appointments. I also discussed with the patient that there may be a patient responsible charge related to this service. The patient expressed understanding and agreed to proceed.   History of Present Illness: Tyler Gallagher is a 59 y.o. 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  PMH also notable for history of TIA, type 2 diabetes, tobacco abuse, obesity, and left AKA.  Patient is currently on systemic treatment with carboplatin/paclitaxel.  He was referred to palliative care to help address goals and manage ongoing symptoms.   Observations/Objective: I called and spoke with patient by phone.  He says he is doing reasonably well.  His only symptomatic complaint is poor oral intake from taste change related to treatment.  Patient says that he has a reasonable appetite just does not like the way food tastes.  He did speak with Joli yesterday and was given some guidance on things to try.  Would recommend the following: -Good oral hygiene -Small, more frequent meals -Use of minutes, gum, or ice -Use of preferable marinades -Chilled or frozen food -Trial of tart foods -Eliminate bad odors -Increasing oral intake  Otherwise, patient says that he is doing well.  He denies other symptomatic complaints.  He has follow-up later this week with [name] and will receive chemotherapy.  Assessment and Plan: Stage IV adenocarcinoma of the lung on chemotherapy.  Doing reasonably well other than taste change.  He is followed by dietitian.  Follow Up Instructions: Follow-up  MyChart visit 1-2 months   I discussed the assessment and treatment plan with the patient. The patient was provided an opportunity to ask questions and all were answered. The patient agreed with the plan and demonstrated an understanding of the instructions.   The patient was advised to call back or seek an in-person evaluation if the symptoms worsen or if the condition fails to improve as anticipated.  I provided 5 minutes of non-face-to-face time during this encounter.   Irean Hong, NP

## 2020-10-12 ENCOUNTER — Ambulatory Visit
Admission: RE | Admit: 2020-10-12 | Discharge: 2020-10-12 | Disposition: A | Discharge status: Home / Self Care | Payer: Medicare HMO | Source: Ambulatory Visit | Attending: Radiation Oncology | Admitting: Radiation Oncology

## 2020-10-12 DIAGNOSIS — C7931 Secondary malignant neoplasm of brain: Secondary | ICD-10-CM | POA: Diagnosis not present

## 2020-10-12 DIAGNOSIS — R931 Abnormal findings on diagnostic imaging of heart and coronary circulation: Secondary | ICD-10-CM | POA: Diagnosis not present

## 2020-10-12 DIAGNOSIS — E860 Dehydration: Secondary | ICD-10-CM | POA: Diagnosis not present

## 2020-10-12 DIAGNOSIS — Z5111 Encounter for antineoplastic chemotherapy: Secondary | ICD-10-CM | POA: Diagnosis not present

## 2020-10-12 DIAGNOSIS — E876 Hypokalemia: Secondary | ICD-10-CM | POA: Diagnosis not present

## 2020-10-12 DIAGNOSIS — Z87891 Personal history of nicotine dependence: Secondary | ICD-10-CM | POA: Diagnosis not present

## 2020-10-12 DIAGNOSIS — R000 Tachycardia, unspecified: Secondary | ICD-10-CM | POA: Diagnosis not present

## 2020-10-12 DIAGNOSIS — Z510 Encounter for antineoplastic radiation therapy: Secondary | ICD-10-CM | POA: Diagnosis not present

## 2020-10-12 DIAGNOSIS — C3431 Malignant neoplasm of lower lobe, right bronchus or lung: Secondary | ICD-10-CM | POA: Diagnosis not present

## 2020-10-12 DIAGNOSIS — R945 Abnormal results of liver function studies: Secondary | ICD-10-CM | POA: Diagnosis not present

## 2020-10-12 DIAGNOSIS — R197 Diarrhea, unspecified: Secondary | ICD-10-CM | POA: Diagnosis not present

## 2020-10-12 DIAGNOSIS — C3491 Malignant neoplasm of unspecified part of right bronchus or lung: Secondary | ICD-10-CM | POA: Diagnosis not present

## 2020-10-13 ENCOUNTER — Inpatient Hospital Stay: Payer: Medicare HMO

## 2020-10-13 ENCOUNTER — Other Ambulatory Visit: Payer: Self-pay

## 2020-10-13 ENCOUNTER — Other Ambulatory Visit: Payer: Self-pay | Admitting: *Deleted

## 2020-10-13 ENCOUNTER — Encounter: Payer: Self-pay | Admitting: Oncology

## 2020-10-13 ENCOUNTER — Inpatient Hospital Stay (HOSPITAL_BASED_OUTPATIENT_CLINIC_OR_DEPARTMENT_OTHER): Payer: Medicare HMO | Admitting: Oncology

## 2020-10-13 ENCOUNTER — Ambulatory Visit
Admission: RE | Admit: 2020-10-13 | Discharge: 2020-10-13 | Disposition: A | Discharge status: Home / Self Care | Payer: Medicare HMO | Source: Ambulatory Visit | Attending: Radiation Oncology | Admitting: Radiation Oncology

## 2020-10-13 VITALS — HR 116

## 2020-10-13 VITALS — BP 115/84 | HR 129 | Temp 96.9°F | Resp 16 | Wt 208.2 lb

## 2020-10-13 DIAGNOSIS — R945 Abnormal results of liver function studies: Secondary | ICD-10-CM

## 2020-10-13 DIAGNOSIS — C3431 Malignant neoplasm of lower lobe, right bronchus or lung: Secondary | ICD-10-CM

## 2020-10-13 DIAGNOSIS — Z510 Encounter for antineoplastic radiation therapy: Secondary | ICD-10-CM | POA: Diagnosis not present

## 2020-10-13 DIAGNOSIS — Z5111 Encounter for antineoplastic chemotherapy: Secondary | ICD-10-CM | POA: Diagnosis not present

## 2020-10-13 DIAGNOSIS — E876 Hypokalemia: Secondary | ICD-10-CM

## 2020-10-13 DIAGNOSIS — C3491 Malignant neoplasm of unspecified part of right bronchus or lung: Secondary | ICD-10-CM | POA: Diagnosis not present

## 2020-10-13 DIAGNOSIS — E860 Dehydration: Secondary | ICD-10-CM | POA: Diagnosis not present

## 2020-10-13 DIAGNOSIS — R000 Tachycardia, unspecified: Secondary | ICD-10-CM | POA: Diagnosis not present

## 2020-10-13 DIAGNOSIS — R197 Diarrhea, unspecified: Secondary | ICD-10-CM | POA: Diagnosis not present

## 2020-10-13 DIAGNOSIS — C7931 Secondary malignant neoplasm of brain: Secondary | ICD-10-CM | POA: Diagnosis not present

## 2020-10-13 DIAGNOSIS — R7989 Other specified abnormal findings of blood chemistry: Secondary | ICD-10-CM

## 2020-10-13 DIAGNOSIS — R931 Abnormal findings on diagnostic imaging of heart and coronary circulation: Secondary | ICD-10-CM | POA: Diagnosis not present

## 2020-10-13 DIAGNOSIS — Z87891 Personal history of nicotine dependence: Secondary | ICD-10-CM | POA: Diagnosis not present

## 2020-10-13 LAB — CBC WITH DIFFERENTIAL/PLATELET
Abs Immature Granulocytes: 0.03 10*3/uL (ref 0.00–0.07)
Basophils Absolute: 0 10*3/uL (ref 0.0–0.1)
Basophils Relative: 1 %
Eosinophils Absolute: 0 10*3/uL (ref 0.0–0.5)
Eosinophils Relative: 1 %
HCT: 33.2 % — ABNORMAL LOW (ref 39.0–52.0)
Hemoglobin: 12 g/dL — ABNORMAL LOW (ref 13.0–17.0)
Immature Granulocytes: 1 %
Lymphocytes Relative: 18 %
Lymphs Abs: 0.5 10*3/uL — ABNORMAL LOW (ref 0.7–4.0)
MCH: 32 pg (ref 26.0–34.0)
MCHC: 36.1 g/dL — ABNORMAL HIGH (ref 30.0–36.0)
MCV: 88.5 fL (ref 80.0–100.0)
Monocytes Absolute: 0.4 10*3/uL (ref 0.1–1.0)
Monocytes Relative: 13 %
Neutro Abs: 1.9 10*3/uL (ref 1.7–7.7)
Neutrophils Relative %: 66 %
Platelets: 207 10*3/uL (ref 150–400)
RBC: 3.75 MIL/uL — ABNORMAL LOW (ref 4.22–5.81)
RDW: 17 % — ABNORMAL HIGH (ref 11.5–15.5)
WBC: 3 10*3/uL — ABNORMAL LOW (ref 4.0–10.5)
nRBC: 0 % (ref 0.0–0.2)

## 2020-10-13 LAB — COMPREHENSIVE METABOLIC PANEL
ALT: 25 U/L (ref 0–44)
AST: 16 U/L (ref 15–41)
Albumin: 3.4 g/dL — ABNORMAL LOW (ref 3.5–5.0)
Alkaline Phosphatase: 74 U/L (ref 38–126)
Anion gap: 14 (ref 5–15)
BUN: <5 mg/dL — ABNORMAL LOW (ref 6–20)
CO2: 22 mmol/L (ref 22–32)
Calcium: 8.5 mg/dL — ABNORMAL LOW (ref 8.9–10.3)
Chloride: 94 mmol/L — ABNORMAL LOW (ref 98–111)
Creatinine, Ser: 0.53 mg/dL — ABNORMAL LOW (ref 0.61–1.24)
GFR, Estimated: >60 mL/min (ref >60)
Glucose, Bld: 97 mg/dL (ref 70–99)
Potassium: 3.1 mmol/L — ABNORMAL LOW (ref 3.5–5.1)
Sodium: 130 mmol/L — ABNORMAL LOW (ref 135–145)
Total Bilirubin: 1.6 mg/dL — ABNORMAL HIGH (ref 0.3–1.2)
Total Protein: 7.1 g/dL (ref 6.5–8.1)

## 2020-10-13 MED ORDER — HEPARIN SOD (PORK) LOCK FLUSH 100 UNIT/ML IV SOLN
500.0000 [IU] | Freq: Once | INTRAVENOUS | Status: AC
  Administered 2020-10-13: 500 [IU] via INTRAVENOUS
  Filled 2020-10-13: qty 5

## 2020-10-13 MED ORDER — PALONOSETRON HCL INJECTION 0.25 MG/5ML
0.2500 mg | Freq: Once | INTRAVENOUS | Status: AC
  Administered 2020-10-13: 0.25 mg via INTRAVENOUS
  Filled 2020-10-13: qty 5

## 2020-10-13 MED ORDER — SODIUM CHLORIDE 0.9% FLUSH
10.0000 mL | INTRAVENOUS | Status: DC | PRN
  Administered 2020-10-13: 10 mL via INTRAVENOUS
  Filled 2020-10-13: qty 10

## 2020-10-13 MED ORDER — HEPARIN SOD (PORK) LOCK FLUSH 100 UNIT/ML IV SOLN
500.0000 [IU] | Freq: Once | INTRAVENOUS | Status: DC | PRN
  Filled 2020-10-13: qty 5

## 2020-10-13 MED ORDER — POTASSIUM CHLORIDE IN NACL 20-0.9 MEQ/L-% IV SOLN: Freq: Once | INTRAVENOUS | Status: DC

## 2020-10-13 MED ORDER — OLANZAPINE 10 MG PO TABS: 10.0000 mg | ORAL_TABLET | Freq: Every day | ORAL | 1 refills | Status: DC

## 2020-10-13 MED ORDER — SODIUM CHLORIDE 0.9 % IV SOLN
300.0000 mg | Freq: Once | INTRAVENOUS | Status: AC
  Administered 2020-10-13: 300 mg via INTRAVENOUS
  Filled 2020-10-13: qty 30

## 2020-10-13 MED ORDER — SODIUM CHLORIDE 0.9 % IV SOLN
Freq: Once | INTRAVENOUS | Status: AC
  Filled 2020-10-13: qty 250

## 2020-10-13 MED ORDER — SODIUM CHLORIDE 0.9 % IV SOLN
20.0000 mg | Freq: Once | INTRAVENOUS | Status: AC
  Administered 2020-10-13: 20 mg via INTRAVENOUS
  Filled 2020-10-13: qty 20

## 2020-10-13 MED ORDER — FAMOTIDINE IN NACL 20-0.9 MG/50ML-% IV SOLN
20.0000 mg | Freq: Once | INTRAVENOUS | Status: AC
  Administered 2020-10-13: 20 mg via INTRAVENOUS
  Filled 2020-10-13: qty 50

## 2020-10-13 MED ORDER — POTASSIUM CHLORIDE IN NACL 20-0.9 MEQ/L-% IV SOLN: INTRAVENOUS | Status: DC

## 2020-10-13 MED ORDER — DIPHENHYDRAMINE HCL 50 MG/ML IJ SOLN
50.0000 mg | Freq: Once | INTRAMUSCULAR | Status: AC
  Administered 2020-10-13: 50 mg via INTRAVENOUS
  Filled 2020-10-13: qty 1

## 2020-10-13 MED ORDER — POTASSIUM CHLORIDE 20 MEQ/100ML IV SOLN
20.0000 meq | Freq: Once | INTRAVENOUS | Status: AC
  Administered 2020-10-13: 20 meq via INTRAVENOUS

## 2020-10-13 MED ORDER — SODIUM CHLORIDE 0.9 % IV SOLN
65.0000 mg/m2 | Freq: Once | INTRAVENOUS | Status: AC
  Administered 2020-10-13: 150 mg via INTRAVENOUS
  Filled 2020-10-13: qty 25

## 2020-10-13 NOTE — Progress Notes (Signed)
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 CTA showed no pulmonary embolism but a 3.8 x 3.8 cm medial right lower lobe mass abutting the right paravertebral pleural margin. No involvement of adjacent vertebral bodies and consistent with primary lung malignancy. He then presented to ER with symptoms of increasing weakness over the right side. He underwent MRI brain with and without contrast at [facility] which showed a solitary metastatic deposit in the left medial frontal lobe with surrounding vasogenic edema. Also had a CT abdomen which showed large area of heterogeneous attenuation within the lumen of the urinary bladder. While this may represent a large amount of blood products the presence of urinary bladder mass not excluded.Patient taken for cystoscopy and TURBT which did not show any bladder mass  PET CT scan showed 3.7 cm right lower lobe mass with an SUV of 30.7. The mass abuts the visceral pleura without definite pleural invasion. No hypermetabolic adenopathy or evidence of distant metastatic disease. Patient underwent bronchoscopy with [name] which showed non-small cell lung cancer favoradenocarcinoma  Patient underwent resection of the solitary  left frontal lobe brain mass which was also consistent with TTF-1 positive lung adenocarcinoma.However repeat MRI brain showed possible Concern for new brain metastases and therefore patient received whole brain radiation.  Plan is for weekly carbotaxol chemotherapy followed by maintenance durvalumab.Omniseqtesting showed high tumor mutational burden and PD-L1 of 70%. No other actionable mutations noted  Interval history- has ongoing fatigue. Reports having altered taste sensation which makes it difficult for him to eat. Denies any heartburn or nausea. Diarrhea has improved with imodium  ECOG PS- 1 Pain scale- 0 Opioid associated constipation- no  Review of systems- Review of Systems  Constitutional: Positive for malaise/fatigue. Negative for chills, fever and weight loss.       Altered taste sensation  HENT: Negative for congestion, ear discharge and nosebleeds.   Eyes: Negative for blurred vision.  Respiratory: Negative for cough, hemoptysis, sputum production, shortness of breath and wheezing.   Cardiovascular: Negative for chest pain, palpitations, orthopnea and claudication.  Gastrointestinal: Positive for diarrhea. Negative for abdominal pain, blood in stool, constipation, heartburn, melena, nausea and vomiting.  Genitourinary: Negative for dysuria, flank pain, frequency, hematuria and urgency.  Musculoskeletal: Negative for back pain, joint pain and myalgias.  Skin: Negative for rash.  Neurological: Negative for dizziness, tingling, focal weakness, seizures, weakness and headaches.  Endo/Heme/Allergies: Does not bruise/bleed easily.  Psychiatric/Behavioral: Negative for depression and suicidal ideas. The patient does not have insomnia.        Allergies  Allergen Reactions  . Penicillins Other (See Comments)    unknown     Past Medical History:  Diagnosis Date  . Diabetes mellitus without complication ([place])   . Hyperlipidemia   . Hypertension   . Lesion of brain     .  Lung cancer ([place])   . Mass of lung      Past Surgical History:  Procedure Laterality Date  . 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 COLONOSCOPY    . CYSTOSCOPY W/ RETROGRADES Bilateral 07/14/2020   Procedure: CYSTOSCOPY WITH RETROGRADE PYELOGRAM;  Surgeon: Hollice Espy, MD;  Location: [facility];  Service: Urology;  Laterality: Bilateral;  . PORTA CATH INSERTION N/A 08/03/2020   Procedure: PORTA CATH INSERTION;  Surgeon: Katha Cabal, MD;  Location: [facility] [facility];  Service: Cardiovascular;  Laterality: N/A;  . PORTA CATH INSERTION N/A 10/03/2020   Procedure: PORTA CATH INSERTION;  Surgeon: Algernon Huxley, MD;  Location: [place] [facility];  Service: Cardiovascular;  Laterality: N/A;  . VIDEO BRONCHOSCOPY WITH ENDOBRONCHIAL NAVIGATION N/A 07/01/2020   Procedure: VIDEO BRONCHOSCOPY WITH ENDOBRONCHIAL NAVIGATION;  Surgeon: [name], MD;  Location: [facility] OR[facility];  Service: Thoracic;  Laterality: N/A;  . VIDEO BRONCHOSCOPY WITH ENDOBRONCHIAL ULTRASOUND N/A 07/01/2020   Procedure: VIDEO BRONCHOSCOPY WITH ENDOBRONCHIAL ULTRASOUND;  Surgeon: [name], MD;  Location: [facility];  Service: Thoracic;  Laterality: N/A;    Social History   Socioeconomic History  . Marital status: Divorced    Spouse name: Not on file  . Number of children: Not on file  . Years of education: Not on file  . Highest education level: Not on file  Occupational History  . Not on file  Tobacco Use  . Smoking status: Former Smoker    Packs/day: 2.00    Years: 32.00    Pack years: 64.00    Types: Cigarettes    Quit date: 06/21/2020    Years since quitting: 0.3  . Smokeless tobacco: Never Used  Vaping Use  . Vaping Use: Never used  Substance and Sexual Activity  . Alcohol use: No    Alcohol/week: 0.0 standard drinks  . Drug use: Not Currently    Types: Marijuana  . Sexual activity: Not on file  Other Topics Concern  . Not on file  Social History Narrative   Lives at home with girlfriend   Social  Determinants of Health   Financial Resource Strain:   . Difficulty of Paying Living Expenses: Not on file  Food Insecurity:   . Worried About Charity fundraiser in the Last Year: Not on file  . Ran Out of Food in the Last Year: Not on file  Transportation Needs:   . Lack of Transportation (Medical): Not on file  . Lack of Transportation (Non-Medical): Not on file  Physical Activity:   . Days of Exercise per Week: Not on file  . Minutes of Exercise per Session: Not on file  Stress:   . Feeling of Stress : Not on file  Social Connections:   . Frequency of Communication with Friends and Family: Not on file  . Frequency of Social Gatherings with Friends and Family: Not on file  . Attends Religious Services: Not on file  . Active Member of Clubs or Organizations: Not on file  . Attends Archivist Meetings: Not on file  . Marital Status: Not on file  Intimate Partner Violence:   . Fear of Current or Ex-Partner: Not on file  . Emotionally Abused: Not on file  . Physically Abused: Not on file  . Sexually Abused: Not on file    Family History  Problem Relation Age of Onset  . Diabetes Sister   . Diabetes Brother      Current Outpatient Medications:  .  atorvastatin (LIPITOR) 10 MG tablet, Take 10 mg by mouth every morning.  (Patient not taking: Reported on 10/03/2020), Disp: , Rfl:  .  calcium carbonate (TUMS EX) 750 MG chewable tablet, Chew 1 tablet by mouth 3 (three) times daily as needed for heartburn., Disp: , Rfl:  .  [employer] SENNA PLUS 8.6-50 MG tablet, Take 2 tablets by mouth at bedtime as needed for mild constipation. , Disp: , Rfl:  .  dexamethasone (DECADRON) 4 MG tablet, Take 1 tablet (4 mg total) by mouth 2 (two) times daily. (Patient not taking: Reported on 09/29/2020), Disp: 60 tablet, Rfl: 0 .  gabapentin (NEURONTIN) 300 MG capsule, Take 900 mg by mouth 3 (three) times daily., Disp: , Rfl:  .  gemfibrozil (LOPID) 600 MG tablet, Take 300 mg by mouth 2 (two) times  daily.  (Patient not taking: Reported on 10/03/2020), Disp: , Rfl:  .  lidocaine-prilocaine (EMLA) cream, Apply to affected area once, Disp: 30 g, Rfl: 3 .  LORazepam (ATIVAN) 0.5 MG tablet, Take 1 tablet (0.5 mg total) by mouth every 8 (eight) hours as needed for anxiety. (Patient not taking: Reported on 09/06/2020), Disp: 30 tablet, Rfl: 0 .  metFORMIN (GLUCOPHAGE-XR) 500 MG 24 hr tablet, Take 500 mg by mouth in the morning and at bedtime. , Disp: , Rfl:  .  ondansetron (ZOFRAN) 4 MG tablet, Take 4 mg by mouth every 8 (eight) hours as needed.  (Patient not taking: Reported on 09/06/2020), Disp: , Rfl:  .  ondansetron (ZOFRAN) 8 MG tablet, Take 1 tablet (8 mg total) by mouth 2 (two) times daily as needed for refractory nausea / vomiting. Start on day 3 after chemo. (Patient not taking: Reported on 08/18/2020), Disp: 30 tablet, Rfl: 1 .  oxyCODONE (OXY IR/ROXICODONE) 5 MG immediate release tablet, Take 1 tablet (5 mg total) by mouth every 6 (six) hours as needed for severe pain. (Patient not taking: Reported on 09/06/2020), Disp: 30 tablet, Rfl: 0 .  pantoprazole (PROTONIX) 20 MG tablet, Take 1 tablet (20 mg total) by mouth daily. (Patient not taking: Reported on 09/06/2020), Disp: 30 tablet, Rfl: 1 .  polyethylene glycol powder (GLYCOLAX/MIRALAX) 17 GM/SCOOP powder, Take by mouth. (Patient not taking: Reported on 08/18/2020), Disp: , Rfl:  .  potassium chloride SA (KLOR-CON) 20 MEQ tablet, Take 1 tablet (20 mEq total) by mouth daily., Disp: 14 tablet, Rfl: 0 .  prochlorperazine (COMPAZINE) 10 MG tablet, Take 1 tablet (10 mg total) by mouth every 6 (six) hours as needed (Nausea or vomiting). (Patient not taking: Reported on 08/03/2020), Disp: 30 tablet, Rfl: 1 .  triamcinolone ointment (KENALOG) 0.5 %, Apply 1 application topically 2 (two) times daily., Disp: 30 g, Rfl: 0 No current facility-administered medications for this visit.  Facility-Administered Medications Ordered in Other Visits:  .  heparin lock flush  100 unit/mL, 500 Units, Intravenous, Once, Brahmanday, Govinda R, MD .  heparin lock flush 100 unit/mL, 500 Units, Intravenous, Once, [name], MD .  sodium chloride flush (NS) 0.9 % injection 10 mL, 10 mL, Intravenous, Once, Brahmanday, Lenetta Quaker R, MD .  sodium chloride flush (NS) 0.9 % injection 10 mL, 10 mL, Intravenous, PRN, [name], MD  Physical exam: There were no vitals filed for this visit. Physical Exam HENT:     Mouth/Throat:     Mouth: Mucous membranes are moist.     Pharynx: Oropharynx is clear.  Eyes:     Pupils: Pupils are equal, round, and reactive to light.  Cardiovascular:     Rate and Rhythm: Regular rhythm. Tachycardia present.     Heart sounds: Normal heart sounds.  Pulmonary:     Effort: Pulmonary effort is normal.     Breath sounds: Normal breath sounds.  Abdominal:     General: Bowel sounds are normal.     Palpations: Abdomen is soft.  Musculoskeletal:     Cervical back: Normal range of motion.  Skin:    General: Skin is warm and dry.  Neurological:     Mental Status: He is alert and oriented to person, place, and time.      CMP Latest Ref Rng & Units 10/06/2020  Glucose 70 - 99 mg/dL 122(H)  BUN 6 - 20 mg/dL 8  Creatinine 0.61 - 1.24 mg/dL 0.59(L)  Sodium 135 - 145 mmol/L 132(L)  Potassium 3.5 - 5.1 mmol/L 2.8(L)  Chloride 98 - 111 mmol/L 99  CO2 22 - 32 mmol/L 23  Calcium 8.9 - 10.3 mg/dL 8.5(L)  Total Protein 6.5 - 8.1 g/dL 7.0  Total Bilirubin 0.3 - 1.2 mg/dL 1.3(H)  Alkaline Phos 38 - 126 U/L 72  AST 15 - 41 U/L 46(H)  ALT 0 - 44 U/L 104(H)   CBC Latest Ref Rng & Units 10/06/2020  WBC 4.0 - 10.5 K/uL 3.1(L)  Hemoglobin 13.0 - 17.0 g/dL 12.5(L)  Hematocrit 39 - 52 % 34.0(L)  Platelets 150 - 400 K/uL 231    No images are attached to the encounter.  PERIPHERAL VASCULAR CATHETERIZATION  Result Date: 10/03/2020 See op note  DG Fluoro Guide CV Line Right  Result Date: 09/29/2020 INDICATION: Redness and swelling around port.  EXAM: PORT CHECK MEDICATIONS: None. ANESTHESIA/SEDATION: None. FLUOROSCOPY TIME:  Fluoroscopy Time: 0 minutes minutes 30 seconds (11.2 mGy). COMPLICATIONS: None immediate. PROCEDURE: Port check was performed under fluoroscopic guidance using nonionic contrast. No flow could be demonstrated into the needle tip and port. Question is the needle tip well positioned. Reaccess should be considered. If further evaluation is needed after reaccess repeat port check can be performed. IMPRESSION: No flow could be demonstrated into the needle tip and port. Questions needle tip well position. Reaccess should be considered. Further evaluation is needed after reassess repeat port check can be performed. These results will be called to the ordering clinician or representative by the Radiologist Assistant, and communication documented in the PACS or [employer]. Electronically Signed   By: Marcello Moores  Register   On: 09/29/2020 14:09     Assessment and plan- Patient is a 59 y.o. male with stage IVacT2 cN0 cM1 adenocarcinoma of the right lung with isolated left frontal lobe metastasess/p resection of brain met.This was followed by whole brain radiation as there was concern for new areas of brain metastases. He is here for on treatment assessment prior to cycle 5 of weekly carbotaxol chemotherapy  Counts are ok to proceed with cycle 5 of weekly carbo taxol chemotherapy today. Bilirubin is mildly elevated at 1.6. ast alt normal. No contraindication to proceeding with chemo today.  He will directly proceed for cycle 6 of carbotaxol chemotherapy next week and I will see him back in 2 weeks.  Patient completes radiation on 10/26/2020.  I will therefore not be giving him seventh weekly cycle of carbotaxol chemotherapy.  Patient reports altered taste sensation and therefore a poor appetite secondary to it.  I will start him on olanzapine 10 mg at night.  He is also following up with nutrition.  Hypokalemia and dehydration:  He will receive 1  L of IV fluids today along with 20 mEq of IV potassium in the same next week as well.  I will plan to repeat scans after concurrent chemoradiation is done and based on response he would be a candidate for maintenance durvalumab  Patient has been having problems with his port site and blood draw was not obtainable but was easily flushed.  This is his second port placement.  Continue to monitor   Visit Diagnosis 1. Encounter for antineoplastic chemotherapy   2. Malignant neoplasm of lower lobe of right lung (HCC)   3. Dehydration   4. Hypokalemia   5. Abnormal LFTs      Dr. Randa Evens, MD, MPH [facility] at [facility] [phone] 10/13/2020 8:36 AM

## 2020-10-13 NOTE — Progress Notes (Signed)
No blood return noted from port. Port flushes without difficulty, no pain, redness or swelling noted. Per [name] okay to use port for treatment.  Per [name] give one liter NS over an hour and 20 MEQs of potassium over an hour and recheck HR.   1105: Per [name] okay to proceed with HR 116 and Bilirubin 1.6.

## 2020-10-13 NOTE — Progress Notes (Signed)
Ok to proceed with labs/vitals today per MD

## 2020-10-14 ENCOUNTER — Ambulatory Visit
Admission: RE | Admit: 2020-10-14 | Discharge: 2020-10-14 | Disposition: A | Discharge status: Home / Self Care | Payer: Medicare HMO | Source: Ambulatory Visit | Attending: Radiation Oncology | Admitting: Radiation Oncology

## 2020-10-14 DIAGNOSIS — C7931 Secondary malignant neoplasm of brain: Secondary | ICD-10-CM | POA: Diagnosis not present

## 2020-10-14 DIAGNOSIS — Z510 Encounter for antineoplastic radiation therapy: Secondary | ICD-10-CM | POA: Diagnosis not present

## 2020-10-14 DIAGNOSIS — C3491 Malignant neoplasm of unspecified part of right bronchus or lung: Secondary | ICD-10-CM | POA: Diagnosis not present

## 2020-10-14 DIAGNOSIS — C3431 Malignant neoplasm of lower lobe, right bronchus or lung: Secondary | ICD-10-CM | POA: Diagnosis not present

## 2020-10-17 ENCOUNTER — Other Ambulatory Visit: Payer: Self-pay | Admitting: *Deleted

## 2020-10-17 ENCOUNTER — Ambulatory Visit
Admission: RE | Admit: 2020-10-17 | Discharge: 2020-10-17 | Disposition: A | Discharge status: Home / Self Care | Payer: Medicare HMO | Source: Ambulatory Visit | Attending: Radiation Oncology | Admitting: Radiation Oncology

## 2020-10-17 ENCOUNTER — Other Ambulatory Visit: Payer: Self-pay

## 2020-10-17 ENCOUNTER — Encounter: Payer: Self-pay | Admitting: *Deleted

## 2020-10-17 ENCOUNTER — Inpatient Hospital Stay (HOSPITAL_BASED_OUTPATIENT_CLINIC_OR_DEPARTMENT_OTHER): Payer: Medicare HMO | Admitting: Oncology

## 2020-10-17 ENCOUNTER — Inpatient Hospital Stay: Payer: Medicare HMO

## 2020-10-17 VITALS — BP 109/72 | HR 133 | Temp 98.6°F | Resp 16

## 2020-10-17 DIAGNOSIS — Z510 Encounter for antineoplastic radiation therapy: Secondary | ICD-10-CM | POA: Diagnosis not present

## 2020-10-17 DIAGNOSIS — Z87891 Personal history of nicotine dependence: Secondary | ICD-10-CM | POA: Diagnosis not present

## 2020-10-17 DIAGNOSIS — R000 Tachycardia, unspecified: Secondary | ICD-10-CM

## 2020-10-17 DIAGNOSIS — R197 Diarrhea, unspecified: Secondary | ICD-10-CM | POA: Diagnosis not present

## 2020-10-17 DIAGNOSIS — C3431 Malignant neoplasm of lower lobe, right bronchus or lung: Secondary | ICD-10-CM

## 2020-10-17 DIAGNOSIS — E876 Hypokalemia: Secondary | ICD-10-CM | POA: Diagnosis not present

## 2020-10-17 DIAGNOSIS — Z95828 Presence of other vascular implants and grafts: Secondary | ICD-10-CM

## 2020-10-17 DIAGNOSIS — R931 Abnormal findings on diagnostic imaging of heart and coronary circulation: Secondary | ICD-10-CM | POA: Diagnosis not present

## 2020-10-17 DIAGNOSIS — C3491 Malignant neoplasm of unspecified part of right bronchus or lung: Secondary | ICD-10-CM | POA: Diagnosis not present

## 2020-10-17 DIAGNOSIS — Z01818 Encounter for other preprocedural examination: Secondary | ICD-10-CM | POA: Diagnosis not present

## 2020-10-17 DIAGNOSIS — E860 Dehydration: Secondary | ICD-10-CM | POA: Diagnosis not present

## 2020-10-17 DIAGNOSIS — C7931 Secondary malignant neoplasm of brain: Secondary | ICD-10-CM | POA: Diagnosis not present

## 2020-10-17 DIAGNOSIS — Z5111 Encounter for antineoplastic chemotherapy: Secondary | ICD-10-CM | POA: Diagnosis not present

## 2020-10-17 LAB — CBC WITH DIFFERENTIAL/PLATELET
Abs Immature Granulocytes: 0.03 10*3/uL (ref 0.00–0.07)
Basophils Absolute: 0 10*3/uL (ref 0.0–0.1)
Basophils Relative: 1 %
Eosinophils Absolute: 0 10*3/uL (ref 0.0–0.5)
Eosinophils Relative: 1 %
HCT: 34.8 % — ABNORMAL LOW (ref 39.0–52.0)
Hemoglobin: 12.2 g/dL — ABNORMAL LOW (ref 13.0–17.0)
Immature Granulocytes: 1 %
Lymphocytes Relative: 14 %
Lymphs Abs: 0.4 10*3/uL — ABNORMAL LOW (ref 0.7–4.0)
MCH: 31.7 pg (ref 26.0–34.0)
MCHC: 35.1 g/dL (ref 30.0–36.0)
MCV: 90.4 fL (ref 80.0–100.0)
Monocytes Absolute: 0.2 10*3/uL (ref 0.1–1.0)
Monocytes Relative: 5 %
Neutro Abs: 2.4 10*3/uL (ref 1.7–7.7)
Neutrophils Relative %: 78 %
Platelets: 208 10*3/uL (ref 150–400)
RBC: 3.85 MIL/uL — ABNORMAL LOW (ref 4.22–5.81)
RDW: 17.1 % — ABNORMAL HIGH (ref 11.5–15.5)
WBC: 3.1 10*3/uL — ABNORMAL LOW (ref 4.0–10.5)
nRBC: 0 % (ref 0.0–0.2)

## 2020-10-17 LAB — COMPREHENSIVE METABOLIC PANEL
ALT: 22 U/L (ref 0–44)
AST: 17 U/L (ref 15–41)
Albumin: 3.5 g/dL (ref 3.5–5.0)
Alkaline Phosphatase: 74 U/L (ref 38–126)
Anion gap: 10 (ref 5–15)
BUN: 6 mg/dL (ref 6–20)
CO2: 27 mmol/L (ref 22–32)
Calcium: 8.5 mg/dL — ABNORMAL LOW (ref 8.9–10.3)
Chloride: 95 mmol/L — ABNORMAL LOW (ref 98–111)
Creatinine, Ser: 0.6 mg/dL — ABNORMAL LOW (ref 0.61–1.24)
GFR, Estimated: >60 mL/min (ref >60)
Glucose, Bld: 110 mg/dL — ABNORMAL HIGH (ref 70–99)
Potassium: 3.3 mmol/L — ABNORMAL LOW (ref 3.5–5.1)
Sodium: 132 mmol/L — ABNORMAL LOW (ref 135–145)
Total Bilirubin: 1.5 mg/dL — ABNORMAL HIGH (ref 0.3–1.2)
Total Protein: 7.1 g/dL (ref 6.5–8.1)

## 2020-10-17 LAB — TROPONIN I (HIGH SENSITIVITY): Troponin I (High Sensitivity): 8 ng/L (ref <18)

## 2020-10-17 MED ORDER — LORAZEPAM 0.5 MG PO TABS: 0.5000 mg | ORAL_TABLET | Freq: Three times a day (TID) | ORAL | 0 refills | Status: DC | PRN

## 2020-10-17 MED ORDER — METOPROLOL TARTRATE 25 MG PO TABS: 12.5000 mg | ORAL_TABLET | Freq: Two times a day (BID) | ORAL | 0 refills | Status: DC

## 2020-10-17 MED ORDER — SODIUM CHLORIDE 0.9% FLUSH
10.0000 mL | INTRAVENOUS | Status: DC | PRN
  Administered 2020-10-17: 10 mL via INTRAVENOUS
  Filled 2020-10-17: qty 10

## 2020-10-17 MED ORDER — SODIUM CHLORIDE 0.9 % IV SOLN
Freq: Once | INTRAVENOUS | Status: AC
  Filled 2020-10-17: qty 250

## 2020-10-17 MED ORDER — SODIUM CHLORIDE 0.9 % IV SOLN
10.0000 mg | Freq: Once | INTRAVENOUS | Status: DC
  Filled 2020-10-17: qty 1

## 2020-10-17 MED ORDER — HEPARIN SOD (PORK) LOCK FLUSH 100 UNIT/ML IV SOLN
500.0000 [IU] | Freq: Once | INTRAVENOUS | Status: AC
  Administered 2020-10-17: 500 [IU] via INTRAVENOUS
  Filled 2020-10-17: qty 5

## 2020-10-17 MED ORDER — DEXAMETHASONE SODIUM PHOSPHATE 10 MG/ML IJ SOLN
10.0000 mg | Freq: Once | INTRAMUSCULAR | Status: AC
  Administered 2020-10-17: 10 mg via INTRAVENOUS
  Filled 2020-10-17: qty 1

## 2020-10-17 MED ORDER — PREDNISONE 10 MG PO TABS: 10.0000 mg | ORAL_TABLET | Freq: Two times a day (BID) | ORAL | 0 refills | Status: DC

## 2020-10-17 NOTE — Progress Notes (Signed)
Pt says that his food tastes like metal , he was having diarrhea and using imodium but none today. He does not think that he got zyprexa due to pharmacy had a issue. I will check on it. Drinking water only all other things he does not drink due to taste. No eating solid food. Heart rate 134. Sat 97

## 2020-10-17 NOTE — Progress Notes (Signed)
Port-a-cath does not yield blood return today. Port-a-cath flushes without difficulty. No swelling, redness, or pain noted at port site. Lab work drawn from a peripheral vein today. MD, [name], notified and aware. Per MD order: okay to proceed with using port-a-cath to administer 0.9% Sodium Chloride infusion and Dexamethasone infusion today.

## 2020-10-18 ENCOUNTER — Ambulatory Visit
Admission: RE | Admit: 2020-10-18 | Discharge: 2020-10-18 | Disposition: A | Discharge status: Home / Self Care | Payer: Medicare HMO | Source: Ambulatory Visit | Attending: Radiation Oncology | Admitting: Radiation Oncology

## 2020-10-18 DIAGNOSIS — C3491 Malignant neoplasm of unspecified part of right bronchus or lung: Secondary | ICD-10-CM | POA: Diagnosis not present

## 2020-10-18 DIAGNOSIS — C7931 Secondary malignant neoplasm of brain: Secondary | ICD-10-CM | POA: Diagnosis not present

## 2020-10-18 DIAGNOSIS — Z510 Encounter for antineoplastic radiation therapy: Secondary | ICD-10-CM | POA: Diagnosis not present

## 2020-10-18 DIAGNOSIS — C3431 Malignant neoplasm of lower lobe, right bronchus or lung: Secondary | ICD-10-CM | POA: Diagnosis not present

## 2020-10-18 NOTE — Progress Notes (Signed)
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 CTA showed no pulmonary embolism but a 3.8 x 3.8 cm medial right lower lobe mass abutting the right paravertebral pleural margin. No involvement of adjacent vertebral bodies and consistent with primary lung malignancy. He then presented to ER with symptoms of increasing weakness over the right side. He underwent MRI brain with and without contrast at [facility] which showed a solitary metastatic deposit in the left medial frontal lobe with surrounding vasogenic edema. Also had a CT abdomen which showed large area of heterogeneous attenuation within the lumen of the urinary bladder. While this may represent a large amount of blood products the presence of urinary bladder mass not excluded.Patient taken for cystoscopy and TURBT which did not show any bladder mass  PET CT scan showed 3.7 cm right lower lobe mass with an SUV of 30.7. The mass abuts the visceral pleura without definite pleural invasion. No hypermetabolic adenopathy or evidence of distant metastatic disease. Patient underwent bronchoscopy with [name] which showed non-small cell lung cancer favoradenocarcinoma  Patient underwent resection of the solitary left frontal lobe  brain mass which was also consistent with TTF-1 positive lung adenocarcinoma.However repeat MRI brain showed possible Concern for new brain metastases and therefore patient received whole brain radiation.  Plan is for weekly carbotaxol chemotherapy followed by maintenance durvalumab.Omniseqtesting showed high tumor mutational burden and PD-L1 of 70%. No other actionable mutations noted   Interval history-patient reports poor appetite since he started chemoradiation but overall getting worse in the last 1 week.  He says he has not had much to eat or drink at all since last 3 days.  Food does not taste good and he has a metallic taste in his mouth.  He has not been drinking his Ensure  ECOG PS- 2 Pain scale- 0 Opioid associated constipation- no  Review of systems- Review of Systems  Constitutional: Positive for malaise/fatigue and weight loss. Negative for chills and fever.  HENT: Negative for congestion, ear discharge and nosebleeds.   Eyes: Negative for blurred vision.  Respiratory: Negative for cough, hemoptysis, sputum production, shortness of breath and wheezing.   Cardiovascular: Negative for chest pain, palpitations, orthopnea and claudication.  Gastrointestinal: Negative for abdominal pain, blood in stool, constipation, diarrhea, heartburn, melena, nausea and vomiting.  Genitourinary: Negative for dysuria, flank pain, frequency, hematuria and urgency.  Musculoskeletal: Negative for back pain, joint pain and myalgias.  Skin: Negative for rash.  Neurological: Negative for dizziness, tingling, focal weakness, seizures, weakness and headaches.  Endo/Heme/Allergies: Does not bruise/bleed easily.  Psychiatric/Behavioral: Negative for depression and suicidal ideas. The patient does not have insomnia.        Allergies  Allergen Reactions  . Penicillins Other (See Comments)    unknown     Past Medical History:  Diagnosis Date  . Diabetes mellitus without  complication ([place])     . Hyperlipidemia   . Hypertension   . Lesion of brain   . Lung cancer ([place])   . Mass of lung      Past Surgical History:  Procedure Laterality Date  . 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 COLONOSCOPY    . CYSTOSCOPY W/ RETROGRADES Bilateral 07/14/2020   Procedure: CYSTOSCOPY WITH RETROGRADE PYELOGRAM;  Surgeon: Hollice Espy, MD;  Location: [facility] ORS;  Service: Urology;  Laterality: Bilateral;  . PORTA CATH INSERTION N/A 08/03/2020   Procedure: PORTA CATH INSERTION;  Surgeon: Katha Cabal, MD;  Location: [facility];  Service: Cardiovascular;  Laterality: N/A;  . PORTA CATH INSERTION N/A 10/03/2020   Procedure: PORTA CATH INSERTION;  Surgeon: Algernon Huxley, MD;  Location: [facility];  Service: Cardiovascular;  Laterality: N/A;  . VIDEO BRONCHOSCOPY WITH ENDOBRONCHIAL NAVIGATION N/A 07/01/2020   Procedure: VIDEO BRONCHOSCOPY WITH ENDOBRONCHIAL NAVIGATION;  Surgeon: [name], MD;  Location: [facility] OR[facility];  Service: Thoracic;  Laterality: N/A;  . VIDEO BRONCHOSCOPY WITH ENDOBRONCHIAL ULTRASOUND N/A 07/01/2020   Procedure: VIDEO BRONCHOSCOPY WITH ENDOBRONCHIAL ULTRASOUND;  Surgeon: [name], MD;  Location: [facility];  Service: Thoracic;  Laterality: N/A;    Social History   Socioeconomic History  . Marital status: Divorced    Spouse name: Not on file  . Number of children: Not on file  . Years of education: Not on file  . Highest education level: Not on file  Occupational History  . Not on file  Tobacco Use  . Smoking status: Former Smoker    Packs/day: 2.00    Years: 32.00    Pack years: 64.00    Types: Cigarettes    Quit date: 06/21/2020    Years since quitting: 0.3  . Smokeless tobacco: Never Used  Vaping Use  . Vaping Use: Never used  Substance and Sexual Activity  . Alcohol use: No    Alcohol/week: 0.0 standard drinks  . Drug use: Not Currently    Types: Marijuana  . Sexual activity: Not on file  Other Topics Concern  . Not on file  Social  History Narrative   Lives at home with girlfriend   Social Determinants of Health   Financial Resource Strain:   . Difficulty of Paying Living Expenses: Not on file  Food Insecurity:   . Worried About Charity fundraiser in the Last Year: Not on file  . Ran Out of Food in the Last Year: Not on file  Transportation Needs:   . Lack of Transportation (Medical): Not on file  . Lack of Transportation (Non-Medical): Not on file  Physical Activity:   . Days of Exercise per Week: Not on file  . Minutes of Exercise per Session: Not on file  Stress:   . Feeling of Stress : Not on file  Social Connections:   . Frequency of Communication with Friends and Family: Not on file  . Frequency of Social Gatherings with Friends and Family: Not on file  . Attends Religious Services: Not on file  . Active Member of Clubs or Organizations: Not on file  . Attends Archivist Meetings: Not on file  . Marital Status: Not on file  Intimate Partner Violence:   . Fear of Current or Ex-Partner: Not on file  . Emotionally Abused: Not on file  . Physically Abused: Not on file  . Sexually Abused: Not on file    Family History  Problem Relation Age  of Onset  . Diabetes Sister   . Diabetes Brother      Current Outpatient Medications:  .  atorvastatin (LIPITOR) 10 MG tablet, Take 10 mg by mouth every morning. , Disp: , Rfl:  .  calcium carbonate (TUMS EX) 750 MG chewable tablet, Chew 1 tablet by mouth 3 (three) times daily as needed for heartburn. , Disp: , Rfl:  .  [employer] SENNA PLUS 8.6-50 MG tablet, Take 2 tablets by mouth at bedtime as needed for mild constipation. , Disp: , Rfl:  .  loperamide (IMODIUM) 2 MG capsule, Take 2 mg by mouth as needed for diarrhea or loose stools., Disp: , Rfl:  .  ondansetron (ZOFRAN) 8 MG tablet, Take 1 tablet (8 mg total) by mouth 2 (two) times daily as needed for refractory nausea / vomiting. Start on day 3 after chemo., Disp: 30 tablet, Rfl: 1 .  gabapentin  (NEURONTIN) 300 MG capsule, Take 900 mg by mouth 3 (three) times daily. (Patient not taking: Reported on 10/17/2020), Disp: , Rfl:  .  gemfibrozil (LOPID) 600 MG tablet, Take 300 mg by mouth 2 (two) times daily.  (Patient not taking: Reported on 10/17/2020), Disp: , Rfl:  .  lidocaine-prilocaine (EMLA) cream, Apply to affected area once (Patient not taking: Reported on 10/17/2020), Disp: 30 g, Rfl: 3 .  LORazepam (ATIVAN) 0.5 MG tablet, Take 1 tablet (0.5 mg total) by mouth every 8 (eight) hours as needed for anxiety., Disp: 30 tablet, Rfl: 0 .  metFORMIN (GLUCOPHAGE-XR) 500 MG 24 hr tablet, Take 500 mg by mouth in the morning and at bedtime.  (Patient not taking: Reported on 10/13/2020), Disp: , Rfl:  .  metoprolol tartrate (LOPRESSOR) 25 MG tablet, Take 0.5 tablets (12.5 mg total) by mouth 2 (two) times daily., Disp: 14 tablet, Rfl: 0 .  OLANZapine (ZYPREXA) 10 MG tablet, Take 1 tablet (10 mg total) by mouth at bedtime. (Patient not taking: Reported on 10/17/2020), Disp: 30 tablet, Rfl: 1 .  ondansetron (ZOFRAN) 4 MG tablet, Take 4 mg by mouth every 8 (eight) hours as needed.  (Patient not taking: Reported on 09/06/2020), Disp: , Rfl:  .  oxyCODONE (OXY IR/ROXICODONE) 5 MG immediate release tablet, Take 1 tablet (5 mg total) by mouth every 6 (six) hours as needed for severe pain. (Patient not taking: Reported on 09/06/2020), Disp: 30 tablet, Rfl: 0 .  pantoprazole (PROTONIX) 20 MG tablet, Take 1 tablet (20 mg total) by mouth daily. (Patient not taking: Reported on 09/06/2020), Disp: 30 tablet, Rfl: 1 .  polyethylene glycol powder (GLYCOLAX/MIRALAX) 17 GM/SCOOP powder, Take by mouth. (Patient not taking: Reported on 08/18/2020), Disp: , Rfl:  .  potassium chloride SA (KLOR-CON) 20 MEQ tablet, Take 1 tablet (20 mEq total) by mouth daily. (Patient not taking: Reported on 10/13/2020), Disp: 14 tablet, Rfl: 0 .  predniSONE (DELTASONE) 10 MG tablet, Take 1 tablet (10 mg total) by mouth 2 (two) times daily with a  meal., Disp: 14 tablet, Rfl: 0 .  prochlorperazine (COMPAZINE) 10 MG tablet, Take 1 tablet (10 mg total) by mouth every 6 (six) hours as needed (Nausea or vomiting). (Patient not taking: Reported on 08/03/2020), Disp: 30 tablet, Rfl: 1 .  triamcinolone ointment (KENALOG) 0.5 %, Apply 1 application topically 2 (two) times daily. (Patient not taking: Reported on 10/13/2020), Disp: 30 g, Rfl: 0 No current facility-administered medications for this visit.  Facility-Administered Medications Ordered in Other Visits:  .  heparin lock flush 100 unit/mL, 500 Units, Intravenous, Once, Brahmanday,  Elisha Headland, MD .  sodium chloride flush (NS) 0.9 % injection 10 mL, 10 mL, Intravenous, Once, Cammie Sickle, MD  Physical exam: Patient afebrile.  Heart rate 135/min Physical Exam Cardiovascular:     Rate and Rhythm: Regular rhythm. Tachycardia present.     Heart sounds: Normal heart sounds.  Pulmonary:     Effort: Pulmonary effort is normal.     Breath sounds: Normal breath sounds.  Abdominal:     General: Bowel sounds are normal.     Palpations: Abdomen is soft.  Skin:    General: Skin is warm and dry.  Neurological:     Mental Status: He is alert and oriented to person, place, and time.      CMP Latest Ref Rng & Units 10/17/2020  Glucose 70 - 99 mg/dL 110(H)  BUN 6 - 20 mg/dL 6  Creatinine 0.61 - 1.24 mg/dL 0.60(L)  Sodium 135 - 145 mmol/L 132(L)  Potassium 3.5 - 5.1 mmol/L 3.3(L)  Chloride 98 - 111 mmol/L 95(L)  CO2 22 - 32 mmol/L 27  Calcium 8.9 - 10.3 mg/dL 8.5(L)  Total Protein 6.5 - 8.1 g/dL 7.1  Total Bilirubin 0.3 - 1.2 mg/dL 1.5(H)  Alkaline Phos 38 - 126 U/L 74  AST 15 - 41 U/L 17  ALT 0 - 44 U/L 22   CBC Latest Ref Rng & Units 10/17/2020  WBC 4.0 - 10.5 K/uL 3.1(L)  Hemoglobin 13.0 - 17.0 g/dL 12.2(L)  Hematocrit 39 - 52 % 34.8(L)  Platelets 150 - 400 K/uL 208    No images are attached to the encounter.  PERIPHERAL VASCULAR CATHETERIZATION  Result Date:  10/03/2020 See op note  DG Fluoro Guide CV Line Right  Result Date: 09/29/2020 INDICATION: Redness and swelling around port. EXAM: PORT CHECK MEDICATIONS: None. ANESTHESIA/SEDATION: None. FLUOROSCOPY TIME:  Fluoroscopy Time: 0 minutes minutes 30 seconds (11.2 mGy). COMPLICATIONS: None immediate. PROCEDURE: Port check was performed under fluoroscopic guidance using nonionic contrast. No flow could be demonstrated into the needle tip and port. Question is the needle tip well positioned. Reaccess should be considered. If further evaluation is needed after reaccess repeat port check can be performed. IMPRESSION: No flow could be demonstrated into the needle tip and port. Questions needle tip well position. Reaccess should be considered. Further evaluation is needed after reassess repeat port check can be performed. These results will be called to the ordering clinician or representative by the Radiologist Assistant, and communication documented in the PACS or [employer]. Electronically Signed   By: Marcello Moores  Register   On: 09/29/2020 14:09     Assessment and plan- Patient is a 59 y.o. male with non-small cell lung cancer and brain metastases currently undergoing concurrent chemoradiation.  This is an acute visit for ongoing fatigue poor appetite and tachycardia  Poor appetite and weight loss: Patient has lost about 30 pounds since the start of treatment.  Reports that food does not taste good and he has a metallic taste in his mouth all the time.  He is also following up with nutrition.  No evidence of mucositis.  He has not picked up his olanzapine as well.  I will start him on prednisone 10 mg twice daily for 1 week and see if his appetite gets better.  He will be completing radiation next week  Tachycardia: Troponin negative no symptoms of chest pain or exertional shortness of breath.  EKG shows sinus tachycardia with frequent PVCs.  I will start him on low-dose metoprolol 12.5  mg twice daily.  Have  also given him low-dose Ativan as needed for anxiety.  Will obtain a echocardiogram as well.  He will be due for repeat CT scans after he completes chemoradiation next week.  He is not hypoxic.  Hold off on getting CT chest at this time.  He has had persistent tachycardia since he started chemoradiation but appears more pronounced today.  I will plan to give him 1 L of IV fluids today along with 10 mg of IV Decadron.  He is seeing me in 4 days for his next chemo and based on how he is doing I will decide if he can get chemotherapy or not.  Hypokalemia: We will continue oral potassium at home   Visit Diagnosis 1. Tachycardia   2. Dehydration   3. Hypokalemia      Dr. Randa Evens, MD, MPH [facility] [phone] 10/18/2020 11:32 AM

## 2020-10-19 ENCOUNTER — Ambulatory Visit
Admission: RE | Admit: 2020-10-19 | Discharge: 2020-10-19 | Disposition: A | Discharge status: Home / Self Care | Payer: Medicare HMO | Source: Ambulatory Visit | Attending: Radiation Oncology | Admitting: Radiation Oncology

## 2020-10-19 DIAGNOSIS — C3491 Malignant neoplasm of unspecified part of right bronchus or lung: Secondary | ICD-10-CM | POA: Diagnosis not present

## 2020-10-19 DIAGNOSIS — R000 Tachycardia, unspecified: Secondary | ICD-10-CM | POA: Diagnosis not present

## 2020-10-19 DIAGNOSIS — C7931 Secondary malignant neoplasm of brain: Secondary | ICD-10-CM | POA: Diagnosis not present

## 2020-10-19 DIAGNOSIS — Z510 Encounter for antineoplastic radiation therapy: Secondary | ICD-10-CM | POA: Diagnosis not present

## 2020-10-19 DIAGNOSIS — Z87891 Personal history of nicotine dependence: Secondary | ICD-10-CM | POA: Diagnosis not present

## 2020-10-19 DIAGNOSIS — Z5111 Encounter for antineoplastic chemotherapy: Secondary | ICD-10-CM | POA: Diagnosis not present

## 2020-10-19 DIAGNOSIS — E860 Dehydration: Secondary | ICD-10-CM | POA: Diagnosis not present

## 2020-10-19 DIAGNOSIS — C3431 Malignant neoplasm of lower lobe, right bronchus or lung: Secondary | ICD-10-CM | POA: Diagnosis not present

## 2020-10-19 DIAGNOSIS — R931 Abnormal findings on diagnostic imaging of heart and coronary circulation: Secondary | ICD-10-CM | POA: Diagnosis not present

## 2020-10-19 DIAGNOSIS — R197 Diarrhea, unspecified: Secondary | ICD-10-CM | POA: Diagnosis not present

## 2020-10-19 DIAGNOSIS — E876 Hypokalemia: Secondary | ICD-10-CM | POA: Diagnosis not present

## 2020-10-20 ENCOUNTER — Inpatient Hospital Stay: Payer: Medicare HMO

## 2020-10-20 ENCOUNTER — Inpatient Hospital Stay (HOSPITAL_BASED_OUTPATIENT_CLINIC_OR_DEPARTMENT_OTHER): Payer: Medicare HMO | Admitting: Oncology

## 2020-10-20 ENCOUNTER — Ambulatory Visit
Admission: RE | Admit: 2020-10-20 | Discharge: 2020-10-20 | Disposition: A | Discharge status: Home / Self Care | Payer: Medicare HMO | Source: Ambulatory Visit | Attending: Radiation Oncology | Admitting: Radiation Oncology

## 2020-10-20 ENCOUNTER — Other Ambulatory Visit: Payer: Self-pay

## 2020-10-20 ENCOUNTER — Encounter: Payer: Self-pay | Admitting: Oncology

## 2020-10-20 VITALS — BP 144/76 | HR 101 | Temp 97.1°F | Resp 16 | Wt 208.0 lb

## 2020-10-20 VITALS — HR 97

## 2020-10-20 DIAGNOSIS — Z5111 Encounter for antineoplastic chemotherapy: Secondary | ICD-10-CM | POA: Diagnosis not present

## 2020-10-20 DIAGNOSIS — E876 Hypokalemia: Secondary | ICD-10-CM

## 2020-10-20 DIAGNOSIS — C3431 Malignant neoplasm of lower lobe, right bronchus or lung: Secondary | ICD-10-CM | POA: Diagnosis not present

## 2020-10-20 DIAGNOSIS — R000 Tachycardia, unspecified: Secondary | ICD-10-CM

## 2020-10-20 DIAGNOSIS — C3491 Malignant neoplasm of unspecified part of right bronchus or lung: Secondary | ICD-10-CM | POA: Diagnosis not present

## 2020-10-20 DIAGNOSIS — R197 Diarrhea, unspecified: Secondary | ICD-10-CM | POA: Diagnosis not present

## 2020-10-20 DIAGNOSIS — E860 Dehydration: Secondary | ICD-10-CM | POA: Diagnosis not present

## 2020-10-20 DIAGNOSIS — Z510 Encounter for antineoplastic radiation therapy: Secondary | ICD-10-CM | POA: Diagnosis not present

## 2020-10-20 DIAGNOSIS — C7931 Secondary malignant neoplasm of brain: Secondary | ICD-10-CM | POA: Diagnosis not present

## 2020-10-20 DIAGNOSIS — Z87891 Personal history of nicotine dependence: Secondary | ICD-10-CM | POA: Diagnosis not present

## 2020-10-20 DIAGNOSIS — R931 Abnormal findings on diagnostic imaging of heart and coronary circulation: Secondary | ICD-10-CM | POA: Diagnosis not present

## 2020-10-20 LAB — COMPREHENSIVE METABOLIC PANEL
ALT: 19 U/L (ref 0–44)
AST: 15 U/L (ref 15–41)
Albumin: 3.3 g/dL — ABNORMAL LOW (ref 3.5–5.0)
Alkaline Phosphatase: 61 U/L (ref 38–126)
Anion gap: 11 (ref 5–15)
BUN: 7 mg/dL (ref 6–20)
CO2: 26 mmol/L (ref 22–32)
Calcium: 8.7 mg/dL — ABNORMAL LOW (ref 8.9–10.3)
Chloride: 99 mmol/L (ref 98–111)
Creatinine, Ser: 0.51 mg/dL — ABNORMAL LOW (ref 0.61–1.24)
GFR, Estimated: >60 mL/min (ref >60)
Glucose, Bld: 100 mg/dL — ABNORMAL HIGH (ref 70–99)
Potassium: 3.1 mmol/L — ABNORMAL LOW (ref 3.5–5.1)
Sodium: 136 mmol/L (ref 135–145)
Total Bilirubin: 1.2 mg/dL (ref 0.3–1.2)
Total Protein: 6.7 g/dL (ref 6.5–8.1)

## 2020-10-20 LAB — CBC WITH DIFFERENTIAL/PLATELET
Abs Immature Granulocytes: 0.03 10*3/uL (ref 0.00–0.07)
Basophils Absolute: 0 10*3/uL (ref 0.0–0.1)
Basophils Relative: 1 %
Eosinophils Absolute: 0 10*3/uL (ref 0.0–0.5)
Eosinophils Relative: 1 %
HCT: 33.7 % — ABNORMAL LOW (ref 39.0–52.0)
Hemoglobin: 11.9 g/dL — ABNORMAL LOW (ref 13.0–17.0)
Immature Granulocytes: 1 %
Lymphocytes Relative: 30 %
Lymphs Abs: 0.8 10*3/uL (ref 0.7–4.0)
MCH: 31.7 pg (ref 26.0–34.0)
MCHC: 35.3 g/dL (ref 30.0–36.0)
MCV: 89.9 fL (ref 80.0–100.0)
Monocytes Absolute: 0.3 10*3/uL (ref 0.1–1.0)
Monocytes Relative: 11 %
Neutro Abs: 1.5 10*3/uL — ABNORMAL LOW (ref 1.7–7.7)
Neutrophils Relative %: 56 %
Platelets: 227 10*3/uL (ref 150–400)
RBC: 3.75 MIL/uL — ABNORMAL LOW (ref 4.22–5.81)
RDW: 17.3 % — ABNORMAL HIGH (ref 11.5–15.5)
WBC: 2.6 10*3/uL — ABNORMAL LOW (ref 4.0–10.5)
nRBC: 0 % (ref 0.0–0.2)

## 2020-10-20 MED ORDER — PALONOSETRON HCL INJECTION 0.25 MG/5ML
0.2500 mg | Freq: Once | INTRAVENOUS | Status: AC
  Administered 2020-10-20: 0.25 mg via INTRAVENOUS
  Filled 2020-10-20: qty 5

## 2020-10-20 MED ORDER — POTASSIUM CHLORIDE IN NACL 20-0.9 MEQ/L-% IV SOLN
Freq: Once | INTRAVENOUS | Status: AC
  Filled 2020-10-20: qty 1000

## 2020-10-20 MED ORDER — FAMOTIDINE IN NACL 20-0.9 MG/50ML-% IV SOLN
20.0000 mg | Freq: Once | INTRAVENOUS | Status: AC
  Administered 2020-10-20: 20 mg via INTRAVENOUS
  Filled 2020-10-20: qty 50

## 2020-10-20 MED ORDER — DIPHENHYDRAMINE HCL 50 MG/ML IJ SOLN
50.0000 mg | Freq: Once | INTRAMUSCULAR | Status: AC
  Administered 2020-10-20: 50 mg via INTRAVENOUS
  Filled 2020-10-20: qty 1

## 2020-10-20 MED ORDER — SODIUM CHLORIDE 0.9 % IV SOLN
65.0000 mg/m2 | Freq: Once | INTRAVENOUS | Status: AC
  Administered 2020-10-20: 150 mg via INTRAVENOUS
  Filled 2020-10-20: qty 25

## 2020-10-20 MED ORDER — SODIUM CHLORIDE 0.9 % IV SOLN
20.0000 mg | Freq: Once | INTRAVENOUS | Status: AC
  Administered 2020-10-20: 20 mg via INTRAVENOUS
  Filled 2020-10-20: qty 20

## 2020-10-20 MED ORDER — HEPARIN SOD (PORK) LOCK FLUSH 100 UNIT/ML IV SOLN
500.0000 [IU] | Freq: Once | INTRAVENOUS | Status: AC | PRN
  Administered 2020-10-20: 500 [IU]
  Filled 2020-10-20: qty 5

## 2020-10-20 MED ORDER — SODIUM CHLORIDE 0.9 % IV SOLN
Freq: Once | INTRAVENOUS | Status: AC
  Filled 2020-10-20: qty 250

## 2020-10-20 MED ORDER — SODIUM CHLORIDE 0.9 % IV SOLN
300.0000 mg | Freq: Once | INTRAVENOUS | Status: AC
  Administered 2020-10-20: 300 mg via INTRAVENOUS
  Filled 2020-10-20: qty 30

## 2020-10-20 MED ORDER — SODIUM CHLORIDE 0.9% FLUSH
10.0000 mL | INTRAVENOUS | Status: DC | PRN
  Administered 2020-10-20: 10 mL
  Filled 2020-10-20: qty 10

## 2020-10-20 MED ORDER — POTASSIUM CHLORIDE CRYS ER 20 MEQ PO TBCR
20.0000 meq | EXTENDED_RELEASE_TABLET | Freq: Every day | ORAL | 0 refills | Status: DC
Start: 2020-10-20 — End: 2020-10-27

## 2020-10-20 MED ORDER — HEPARIN SOD (PORK) LOCK FLUSH 100 UNIT/ML IV SOLN
INTRAVENOUS | Status: AC
  Filled 2020-10-20: qty 5

## 2020-10-20 NOTE — Progress Notes (Signed)
Patient complains that his port site was tender to touch , intermittently during potassium IV infusion.Rate was decreased to 731mls/hr from 956mls/hr and patient states that it was better. Pt was able to complete infusion. Patient states that he is going to talk with the Doctor about using a PIV next time do to discomfort. Patient has no swelling at [facility] site , unable to obtain blood return but per patient that is normal for his port. No signs of distress noted.

## 2020-10-20 NOTE — Progress Notes (Signed)
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 CTA showed no pulmonary embolism but a 3.8 x 3.8 cm medial right lower lobe mass abutting the right paravertebral pleural margin. No involvement of adjacent vertebral bodies and consistent with primary lung malignancy. He then presented to ER with symptoms of increasing weakness over the right side. He underwent MRI brain with and without contrast at [facility] which showed a solitary metastatic deposit in the left medial frontal lobe with surrounding vasogenic edema. Also had a CT abdomen which showed large area of heterogeneous attenuation within the lumen of the urinary bladder. While this may represent a large amount of blood products the presence of urinary bladder mass not excluded.Patient taken for cystoscopy and TURBT which did not show any bladder mass  PET CT scan showed 3.7 cm right lower lobe mass with an SUV of 30.7. The mass abuts the visceral pleura without definite pleural invasion. No hypermetabolic adenopathy or evidence of distant metastatic disease. Patient underwent bronchoscopy with [name] which showed non-small cell lung cancer favoradenocarcinoma  Patient underwent resection of the solitary  left frontal lobe brain mass which was also consistent with TTF-1 positive lung adenocarcinoma.However repeat MRI brain showed possible Concern for new brain metastases and therefore patient received whole brain radiation.  Plan is for weekly carbotaxol chemotherapy followed by maintenance durvalumab.Omniseqtesting showed high tumor mutational burden and PD-L1 of 70%. No other actionable mutations noted   Interval history-patient was started on low-dose metoprolol 12.5 mg twice daily as well as anxiety medicine for his tachycardia and reports that it is helping him.  He also feels better after trial of steroids.  ECOG PS- 2 Pain scale- 0 Opioid associated constipation- no  Review of systems- Review of Systems  Constitutional: Positive for malaise/fatigue. Negative for chills, fever and weight loss.  HENT: Negative for congestion, ear discharge and nosebleeds.   Eyes: Negative for blurred vision.  Respiratory: Negative for cough, hemoptysis, sputum production, shortness of breath and wheezing.   Cardiovascular: Negative for chest pain, palpitations, orthopnea and claudication.  Gastrointestinal: Negative for abdominal pain, blood in stool, constipation, diarrhea, heartburn, melena, nausea and vomiting.  Genitourinary: Negative for dysuria, flank pain, frequency, hematuria and urgency.  Musculoskeletal: Negative for back pain, joint pain and myalgias.  Skin: Negative for rash.  Neurological: Negative for dizziness, tingling, focal weakness, seizures, weakness and headaches.  Endo/Heme/Allergies: Does not bruise/bleed easily.  Psychiatric/Behavioral: Negative for depression and suicidal ideas. The patient does not have insomnia.       Allergies  Allergen Reactions  . Penicillins Other (See Comments)    unknown     Past Medical History:  Diagnosis Date  . Diabetes mellitus without complication ([place])   . Hyperlipidemia   . Hypertension   . Lesion of brain   . Lung cancer  ([place])   .  Mass of lung      Past Surgical History:  Procedure Laterality Date  . 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 COLONOSCOPY    . CYSTOSCOPY W/ RETROGRADES Bilateral 07/14/2020   Procedure: CYSTOSCOPY WITH RETROGRADE PYELOGRAM;  Surgeon: Hollice Espy, MD;  Location: [facility];  Service: Urology;  Laterality: Bilateral;  . PORTA CATH INSERTION N/A 08/03/2020   Procedure: PORTA CATH INSERTION;  Surgeon: Katha Cabal, MD;  Location: [place] [facility];  Service: Cardiovascular;  Laterality: N/A;  . PORTA CATH INSERTION N/A 10/03/2020   Procedure: PORTA CATH INSERTION;  Surgeon: Algernon Huxley, MD;  Location: [place] [facility];  Service: Cardiovascular;  Laterality: N/A;  . VIDEO BRONCHOSCOPY WITH ENDOBRONCHIAL NAVIGATION N/A 07/01/2020   Procedure: VIDEO BRONCHOSCOPY WITH ENDOBRONCHIAL NAVIGATION;  Surgeon: [name], MD;  Location: [facility];  Service: Thoracic;  Laterality: N/A;  . VIDEO BRONCHOSCOPY WITH ENDOBRONCHIAL ULTRASOUND N/A 07/01/2020   Procedure: VIDEO BRONCHOSCOPY WITH ENDOBRONCHIAL ULTRASOUND;  Surgeon: [name], MD;  Location: [facility];  Service: Thoracic;  Laterality: N/A;    Social History   Socioeconomic History  . Marital status: Divorced    Spouse name: Not on file  . Number of children: Not on file  . Years of education: Not on file  . Highest education level: Not on file  Occupational History  . Not on file  Tobacco Use  . Smoking status: Former Smoker    Packs/day: 2.00    Years: 32.00    Pack years: 64.00    Types: Cigarettes    Quit date: 06/21/2020    Years since quitting: 0.3  . Smokeless tobacco: Never Used  Vaping Use  . Vaping Use: Never used  Substance and Sexual Activity  . Alcohol use: No    Alcohol/week: 0.0 standard drinks  . Drug use: Not Currently    Types: Marijuana  . Sexual activity: Not on file  Other Topics Concern  . Not on file  Social History Narrative   Lives at home with girlfriend   Social Determinants of  Health   Financial Resource Strain:   . Difficulty of Paying Living Expenses: Not on file  Food Insecurity:   . Worried About Charity fundraiser in the Last Year: Not on file  . Ran Out of Food in the Last Year: Not on file  Transportation Needs:   . Lack of Transportation (Medical): Not on file  . Lack of Transportation (Non-Medical): Not on file  Physical Activity:   . Days of Exercise per Week: Not on file  . Minutes of Exercise per Session: Not on file  Stress:   . Feeling of Stress : Not on file  Social Connections:   . Frequency of Communication with Friends and Family: Not on file  . Frequency of Social Gatherings with Friends and Family: Not on file  . Attends Religious Services: Not on file  . Active Member of Clubs or Organizations: Not on file  . Attends Archivist Meetings: Not on file  . Marital Status: Not on file  Intimate Partner Violence:   . Fear of Current or Ex-Partner: Not on file  . Emotionally Abused: Not on file  . Physically Abused: Not on file  . Sexually Abused: Not on file    Family History  Problem Relation Age of Onset  . Diabetes Sister   . Diabetes Brother      Current Outpatient Medications:  .  atorvastatin (LIPITOR) 10 MG tablet,  Take 10 mg by mouth every morning. , Disp: , Rfl:  .  calcium carbonate (TUMS EX) 750 MG chewable tablet, Chew 1 tablet by mouth 3 (three) times daily as needed for heartburn. , Disp: , Rfl:  .  [employer] SENNA PLUS 8.6-50 MG tablet, Take 2 tablets by mouth at bedtime as needed for mild constipation. , Disp: , Rfl:  .  gabapentin (NEURONTIN) 300 MG capsule, Take 900 mg by mouth 3 (three) times daily. (Patient not taking: Reported on 10/17/2020), Disp: , Rfl:  .  gemfibrozil (LOPID) 600 MG tablet, Take 300 mg by mouth 2 (two) times daily.  (Patient not taking: Reported on 10/17/2020), Disp: , Rfl:  .  lidocaine-prilocaine (EMLA) cream, Apply to affected area once (Patient not taking: Reported on 10/17/2020),  Disp: 30 g, Rfl: 3 .  loperamide (IMODIUM) 2 MG capsule, Take 2 mg by mouth as needed for diarrhea or loose stools., Disp: , Rfl:  .  LORazepam (ATIVAN) 0.5 MG tablet, Take 1 tablet (0.5 mg total) by mouth every 8 (eight) hours as needed for anxiety., Disp: 30 tablet, Rfl: 0 .  metFORMIN (GLUCOPHAGE-XR) 500 MG 24 hr tablet, Take 500 mg by mouth in the morning and at bedtime.  (Patient not taking: Reported on 10/13/2020), Disp: , Rfl:  .  metoprolol tartrate (LOPRESSOR) 25 MG tablet, Take 0.5 tablets (12.5 mg total) by mouth 2 (two) times daily., Disp: 14 tablet, Rfl: 0 .  OLANZapine (ZYPREXA) 10 MG tablet, Take 1 tablet (10 mg total) by mouth at bedtime. (Patient not taking: Reported on 10/17/2020), Disp: 30 tablet, Rfl: 1 .  ondansetron (ZOFRAN) 4 MG tablet, Take 4 mg by mouth every 8 (eight) hours as needed.  (Patient not taking: Reported on 09/06/2020), Disp: , Rfl:  .  ondansetron (ZOFRAN) 8 MG tablet, Take 1 tablet (8 mg total) by mouth 2 (two) times daily as needed for refractory nausea / vomiting. Start on day 3 after chemo., Disp: 30 tablet, Rfl: 1 .  oxyCODONE (OXY IR/ROXICODONE) 5 MG immediate release tablet, Take 1 tablet (5 mg total) by mouth every 6 (six) hours as needed for severe pain. (Patient not taking: Reported on 09/06/2020), Disp: 30 tablet, Rfl: 0 .  pantoprazole (PROTONIX) 20 MG tablet, Take 1 tablet (20 mg total) by mouth daily. (Patient not taking: Reported on 09/06/2020), Disp: 30 tablet, Rfl: 1 .  polyethylene glycol powder (GLYCOLAX/MIRALAX) 17 GM/SCOOP powder, Take by mouth. (Patient not taking: Reported on 08/18/2020), Disp: , Rfl:  .  potassium chloride SA (KLOR-CON) 20 MEQ tablet, Take 1 tablet (20 mEq total) by mouth daily. (Patient not taking: Reported on 10/13/2020), Disp: 14 tablet, Rfl: 0 .  predniSONE (DELTASONE) 10 MG tablet, Take 1 tablet (10 mg total) by mouth 2 (two) times daily with a meal., Disp: 14 tablet, Rfl: 0 .  prochlorperazine (COMPAZINE) 10 MG tablet, Take 1  tablet (10 mg total) by mouth every 6 (six) hours as needed (Nausea or vomiting). (Patient not taking: Reported on 08/03/2020), Disp: 30 tablet, Rfl: 1 .  triamcinolone ointment (KENALOG) 0.5 %, Apply 1 application topically 2 (two) times daily. (Patient not taking: Reported on 10/13/2020), Disp: 30 g, Rfl: 0 No current facility-administered medications for this visit.  Facility-Administered Medications Ordered in Other Visits:  .  heparin lock flush 100 unit/mL, 500 Units, Intravenous, Once, Brahmanday, Govinda R, MD .  sodium chloride flush (NS) 0.9 % injection 10 mL, 10 mL, Intravenous, Once, Cammie Sickle, MD  Physical exam: There were  no vitals filed for this visit. Physical Exam Constitutional:      Comments: Sitting in a wheelchair.  Appears in no acute distress  Cardiovascular:     Rate and Rhythm: Regular rhythm. Tachycardia present.     Heart sounds: Normal heart sounds.  Pulmonary:     Effort: Pulmonary effort is normal.     Breath sounds: Normal breath sounds.  Abdominal:     General: Bowel sounds are normal.     Palpations: Abdomen is soft.  Musculoskeletal:     Cervical back: Normal range of motion.     Comments: S/p left AKA  Skin:    General: Skin is warm and dry.  Neurological:     Mental Status: He is alert and oriented to person, place, and time.      CMP Latest Ref Rng & Units 10/17/2020  Glucose 70 - 99 mg/dL 110(H)  BUN 6 - 20 mg/dL 6  Creatinine 0.61 - 1.24 mg/dL 0.60(L)  Sodium 135 - 145 mmol/L 132(L)  Potassium 3.5 - 5.1 mmol/L 3.3(L)  Chloride 98 - 111 mmol/L 95(L)  CO2 22 - 32 mmol/L 27  Calcium 8.9 - 10.3 mg/dL 8.5(L)  Total Protein 6.5 - 8.1 g/dL 7.1  Total Bilirubin 0.3 - 1.2 mg/dL 1.5(H)  Alkaline Phos 38 - 126 U/L 74  AST 15 - 41 U/L 17  ALT 0 - 44 U/L 22   CBC Latest Ref Rng & Units 10/17/2020  WBC 4.0 - 10.5 K/uL 3.1(L)  Hemoglobin 13.0 - 17.0 g/dL 12.2(L)  Hematocrit 39 - 52 % 34.8(L)  Platelets 150 - 400 K/uL 208    No  images are attached to the encounter.  PERIPHERAL VASCULAR CATHETERIZATION  Result Date: 10/03/2020 See op note  DG Fluoro Guide CV Line Right  Result Date: 09/29/2020 INDICATION: Redness and swelling around port. EXAM: PORT CHECK MEDICATIONS: None. ANESTHESIA/SEDATION: None. FLUOROSCOPY TIME:  Fluoroscopy Time: 0 minutes minutes 30 seconds (11.2 mGy). COMPLICATIONS: None immediate. PROCEDURE: Port check was performed under fluoroscopic guidance using nonionic contrast. No flow could be demonstrated into the needle tip and port. Question is the needle tip well positioned. Reaccess should be considered. If further evaluation is needed after reaccess repeat port check can be performed. IMPRESSION: No flow could be demonstrated into the needle tip and port. Questions needle tip well position. Reaccess should be considered. Further evaluation is needed after reassess repeat port check can be performed. These results will be called to the ordering clinician or representative by the Radiologist Assistant, and communication documented in the PACS or [employer]. Electronically Signed   By: Marcello Moores  Register   On: 09/29/2020 14:09     Assessment and plan- Patient is a 59 y.o. male with stage IVacT2 cN0 cM1 adenocarcinoma of the right lung with isolated left frontal lobe metastasess/p resection of brain met.This was followed by whole brain radiation as there was concern for new areas of brain metastases.  He is here for on treatment assessment prior to cycle 6 of weekly carbotaxol chemotherapy  Problems okay to proceed with cycle 6 of weekly carbotaxol chemotherapy today.  He completes radiation treatment in 6 days and will therefore not receive any further chemotherapy at this time.  I will plan to give him 1 L of IV fluids today along with 20 mEq of IV potassium for hypokalemia.  Send a prescription for oral potassium  Tachycardia: Improved after starting low-dose metoprolol and as needed  Ativan  CT chest abdomen and pelvis with  contrast in about 2-1/2 weeks and I will see him thereafter with labs   Visit Diagnosis 1. Encounter for antineoplastic chemotherapy   2. Tachycardia   3. Hypokalemia   4. Malignant neoplasm of lower lobe of right lung ([place])      Dr. Randa Evens, MD, MPH [facility] at [facility] [phone] 10/20/2020 8:40 AM

## 2020-10-21 ENCOUNTER — Ambulatory Visit
Admission: RE | Admit: 2020-10-21 | Discharge: 2020-10-21 | Disposition: A | Discharge status: Home / Self Care | Payer: Medicare HMO | Source: Ambulatory Visit | Attending: Radiation Oncology | Admitting: Radiation Oncology

## 2020-10-21 ENCOUNTER — Ambulatory Visit: Payer: Medicare HMO

## 2020-10-21 DIAGNOSIS — Z510 Encounter for antineoplastic radiation therapy: Secondary | ICD-10-CM | POA: Diagnosis not present

## 2020-10-21 DIAGNOSIS — C7931 Secondary malignant neoplasm of brain: Secondary | ICD-10-CM | POA: Diagnosis not present

## 2020-10-21 DIAGNOSIS — C3431 Malignant neoplasm of lower lobe, right bronchus or lung: Secondary | ICD-10-CM | POA: Diagnosis not present

## 2020-10-21 DIAGNOSIS — C3491 Malignant neoplasm of unspecified part of right bronchus or lung: Secondary | ICD-10-CM | POA: Diagnosis not present

## 2020-10-22 ENCOUNTER — Ambulatory Visit: Payer: Medicare HMO

## 2020-10-24 ENCOUNTER — Ambulatory Visit
Admission: RE | Admit: 2020-10-24 | Discharge: 2020-10-24 | Disposition: A | Discharge status: Home / Self Care | Payer: Medicare HMO | Source: Ambulatory Visit | Attending: Radiation Oncology | Admitting: Radiation Oncology

## 2020-10-24 ENCOUNTER — Ambulatory Visit: Payer: Medicare HMO

## 2020-10-24 DIAGNOSIS — C3491 Malignant neoplasm of unspecified part of right bronchus or lung: Secondary | ICD-10-CM | POA: Diagnosis not present

## 2020-10-24 DIAGNOSIS — C7931 Secondary malignant neoplasm of brain: Secondary | ICD-10-CM | POA: Diagnosis not present

## 2020-10-24 DIAGNOSIS — C3431 Malignant neoplasm of lower lobe, right bronchus or lung: Secondary | ICD-10-CM | POA: Diagnosis not present

## 2020-10-24 DIAGNOSIS — Z510 Encounter for antineoplastic radiation therapy: Secondary | ICD-10-CM | POA: Diagnosis not present

## 2020-10-25 ENCOUNTER — Ambulatory Visit: Payer: Medicare HMO

## 2020-10-25 ENCOUNTER — Ambulatory Visit
Admission: RE | Admit: 2020-10-25 | Discharge: 2020-10-25 | Disposition: A | Discharge status: Home / Self Care | Payer: Medicare HMO | Source: Ambulatory Visit | Attending: Radiation Oncology | Admitting: Radiation Oncology

## 2020-10-25 DIAGNOSIS — C3491 Malignant neoplasm of unspecified part of right bronchus or lung: Secondary | ICD-10-CM | POA: Diagnosis not present

## 2020-10-25 DIAGNOSIS — C7931 Secondary malignant neoplasm of brain: Secondary | ICD-10-CM | POA: Diagnosis not present

## 2020-10-25 DIAGNOSIS — C3431 Malignant neoplasm of lower lobe, right bronchus or lung: Secondary | ICD-10-CM | POA: Diagnosis not present

## 2020-10-25 DIAGNOSIS — Z510 Encounter for antineoplastic radiation therapy: Secondary | ICD-10-CM | POA: Diagnosis not present

## 2020-10-26 ENCOUNTER — Ambulatory Visit
Admission: RE | Admit: 2020-10-26 | Discharge: 2020-10-26 | Disposition: A | Discharge status: Home / Self Care | Payer: Medicare HMO | Source: Ambulatory Visit | Attending: Oncology | Admitting: Oncology

## 2020-10-26 ENCOUNTER — Ambulatory Visit: Payer: Medicare HMO

## 2020-10-26 ENCOUNTER — Other Ambulatory Visit: Payer: Self-pay

## 2020-10-26 ENCOUNTER — Ambulatory Visit
Admission: RE | Admit: 2020-10-26 | Discharge: 2020-10-26 | Disposition: A | Discharge status: Home / Self Care | Payer: Medicare HMO | Source: Ambulatory Visit | Attending: Radiation Oncology | Admitting: Radiation Oncology

## 2020-10-26 DIAGNOSIS — E119 Type 2 diabetes mellitus without complications: Secondary | ICD-10-CM | POA: Insufficient documentation

## 2020-10-26 DIAGNOSIS — C7931 Secondary malignant neoplasm of brain: Secondary | ICD-10-CM | POA: Diagnosis not present

## 2020-10-26 DIAGNOSIS — C3431 Malignant neoplasm of lower lobe, right bronchus or lung: Secondary | ICD-10-CM | POA: Diagnosis not present

## 2020-10-26 DIAGNOSIS — R000 Tachycardia, unspecified: Secondary | ICD-10-CM

## 2020-10-26 DIAGNOSIS — I10 Essential (primary) hypertension: Secondary | ICD-10-CM | POA: Insufficient documentation

## 2020-10-26 DIAGNOSIS — C3491 Malignant neoplasm of unspecified part of right bronchus or lung: Secondary | ICD-10-CM | POA: Diagnosis not present

## 2020-10-26 DIAGNOSIS — C3490 Malignant neoplasm of unspecified part of unspecified bronchus or lung: Secondary | ICD-10-CM | POA: Insufficient documentation

## 2020-10-26 DIAGNOSIS — R002 Palpitations: Secondary | ICD-10-CM | POA: Insufficient documentation

## 2020-10-26 DIAGNOSIS — I509 Heart failure, unspecified: Secondary | ICD-10-CM | POA: Diagnosis not present

## 2020-10-26 DIAGNOSIS — E785 Hyperlipidemia, unspecified: Secondary | ICD-10-CM | POA: Insufficient documentation

## 2020-10-26 DIAGNOSIS — Z510 Encounter for antineoplastic radiation therapy: Secondary | ICD-10-CM | POA: Diagnosis not present

## 2020-10-26 LAB — ECHOCARDIOGRAM COMPLETE: S' Lateral: 3.77 cm

## 2020-10-26 MED ORDER — PERFLUTREN LIPID MICROSPHERE
1.0000 mL | INTRAVENOUS | Status: DC | PRN
  Administered 2020-10-26: 2 mL via INTRAVENOUS
  Filled 2020-10-26: qty 10

## 2020-10-26 NOTE — Progress Notes (Signed)
*  PRELIMINARY RESULTS* Echocardiogram 2D Echocardiogram has been performed.  Sherrie Sport 10/26/2020, 12:23 PM

## 2020-10-27 ENCOUNTER — Encounter: Payer: Self-pay | Admitting: Oncology

## 2020-10-27 ENCOUNTER — Other Ambulatory Visit: Payer: Self-pay | Admitting: *Deleted

## 2020-10-27 ENCOUNTER — Inpatient Hospital Stay: Payer: Medicare HMO

## 2020-10-27 ENCOUNTER — Inpatient Hospital Stay (HOSPITAL_BASED_OUTPATIENT_CLINIC_OR_DEPARTMENT_OTHER): Payer: Medicare HMO | Admitting: Oncology

## 2020-10-27 VITALS — BP 100/77 | HR 110 | Temp 97.1°F | Resp 16 | Wt 197.7 lb

## 2020-10-27 DIAGNOSIS — R931 Abnormal findings on diagnostic imaging of heart and coronary circulation: Secondary | ICD-10-CM

## 2020-10-27 DIAGNOSIS — C3431 Malignant neoplasm of lower lobe, right bronchus or lung: Secondary | ICD-10-CM

## 2020-10-27 DIAGNOSIS — R197 Diarrhea, unspecified: Secondary | ICD-10-CM | POA: Diagnosis not present

## 2020-10-27 DIAGNOSIS — E860 Dehydration: Secondary | ICD-10-CM

## 2020-10-27 DIAGNOSIS — Z87891 Personal history of nicotine dependence: Secondary | ICD-10-CM | POA: Diagnosis not present

## 2020-10-27 DIAGNOSIS — R000 Tachycardia, unspecified: Secondary | ICD-10-CM

## 2020-10-27 DIAGNOSIS — Z5111 Encounter for antineoplastic chemotherapy: Secondary | ICD-10-CM | POA: Diagnosis not present

## 2020-10-27 DIAGNOSIS — C7931 Secondary malignant neoplasm of brain: Secondary | ICD-10-CM | POA: Diagnosis not present

## 2020-10-27 DIAGNOSIS — E876 Hypokalemia: Secondary | ICD-10-CM | POA: Diagnosis not present

## 2020-10-27 LAB — COMPREHENSIVE METABOLIC PANEL
ALT: 26 U/L (ref 0–44)
AST: 19 U/L (ref 15–41)
Albumin: 3.5 g/dL (ref 3.5–5.0)
Alkaline Phosphatase: 61 U/L (ref 38–126)
Anion gap: 9 (ref 5–15)
BUN: 6 mg/dL (ref 6–20)
CO2: 28 mmol/L (ref 22–32)
Calcium: 8.4 mg/dL — ABNORMAL LOW (ref 8.9–10.3)
Chloride: 98 mmol/L (ref 98–111)
Creatinine, Ser: 0.76 mg/dL (ref 0.61–1.24)
GFR, Estimated: >60 mL/min (ref >60)
Glucose, Bld: 113 mg/dL — ABNORMAL HIGH (ref 70–99)
Potassium: 2.7 mmol/L — CL (ref 3.5–5.1)
Sodium: 135 mmol/L (ref 135–145)
Total Bilirubin: 1.5 mg/dL — ABNORMAL HIGH (ref 0.3–1.2)
Total Protein: 7 g/dL (ref 6.5–8.1)

## 2020-10-27 LAB — CBC WITH DIFFERENTIAL/PLATELET
Abs Immature Granulocytes: 0.04 10*3/uL (ref 0.00–0.07)
Basophils Absolute: 0 10*3/uL (ref 0.0–0.1)
Basophils Relative: 1 %
Eosinophils Absolute: 0 10*3/uL (ref 0.0–0.5)
Eosinophils Relative: 1 %
HCT: 35.9 % — ABNORMAL LOW (ref 39.0–52.0)
Hemoglobin: 12.2 g/dL — ABNORMAL LOW (ref 13.0–17.0)
Immature Granulocytes: 2 %
Lymphocytes Relative: 21 %
Lymphs Abs: 0.6 10*3/uL — ABNORMAL LOW (ref 0.7–4.0)
MCH: 31.1 pg (ref 26.0–34.0)
MCHC: 34 g/dL (ref 30.0–36.0)
MCV: 91.6 fL (ref 80.0–100.0)
Monocytes Absolute: 0.3 10*3/uL (ref 0.1–1.0)
Monocytes Relative: 11 %
Neutro Abs: 1.7 10*3/uL (ref 1.7–7.7)
Neutrophils Relative %: 64 %
Platelets: 196 10*3/uL (ref 150–400)
RBC: 3.92 MIL/uL — ABNORMAL LOW (ref 4.22–5.81)
RDW: 18.5 % — ABNORMAL HIGH (ref 11.5–15.5)
WBC: 2.6 10*3/uL — ABNORMAL LOW (ref 4.0–10.5)
nRBC: 0 % (ref 0.0–0.2)

## 2020-10-27 MED ORDER — HEPARIN SOD (PORK) LOCK FLUSH 100 UNIT/ML IV SOLN
INTRAVENOUS | Status: AC
  Filled 2020-10-27: qty 5

## 2020-10-27 MED ORDER — POTASSIUM CHLORIDE IN NACL 20-0.9 MEQ/L-% IV SOLN
Freq: Once | INTRAVENOUS | Status: AC
  Filled 2020-10-27: qty 1000

## 2020-10-27 MED ORDER — HEPARIN SOD (PORK) LOCK FLUSH 100 UNIT/ML IV SOLN
500.0000 [IU] | Freq: Once | INTRAVENOUS | Status: AC
  Administered 2020-10-27: 500 [IU] via INTRAVENOUS
  Filled 2020-10-27: qty 5

## 2020-10-27 MED ORDER — POTASSIUM CHLORIDE CRYS ER 20 MEQ PO TBCR
20.0000 meq | EXTENDED_RELEASE_TABLET | Freq: Two times a day (BID) | ORAL | 0 refills | Status: DC
Start: 2020-10-27 — End: 2020-11-15

## 2020-10-27 MED ORDER — SODIUM CHLORIDE 0.9% FLUSH
10.0000 mL | Freq: Once | INTRAVENOUS | Status: AC
  Administered 2020-10-27: 10 mL via INTRAVENOUS
  Filled 2020-10-27: qty 10

## 2020-10-27 NOTE — Progress Notes (Unsigned)
Am

## 2020-10-27 NOTE — Progress Notes (Signed)
MD aware that pt.'s port is not giving any blood return today prior to fluids. Pt has no complaints while flushing the port other than his port feeling tender at the site where the needle is. There is no swelling, redness, or discomfort while flushing his port. Verbal order to continue with fluids with 20 mEq K today. Pt updated and agrees with plan. RN educated pt on the importance of notifying the clinic if any complications occurs at home. Pt verbalized understanding. Pt stable for discharge.   Zyanne Schumm [employer]

## 2020-10-29 NOTE — Progress Notes (Signed)
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 CTA showed no pulmonary embolism but a 3.8 x 3.8 cm medial right lower lobe mass abutting the right paravertebral pleural margin. No involvement of adjacent vertebral bodies and consistent with primary lung malignancy. He then presented to ER with symptoms of increasing weakness over the right side. He underwent MRI brain with and without contrast at [facility] which showed a solitary metastatic deposit in the left medial frontal lobe with surrounding vasogenic edema. Also had a CT abdomen which showed large area of heterogeneous attenuation within the lumen of the urinary bladder. While this may represent a large amount of blood products the presence of urinary bladder mass not excluded.Patient taken for cystoscopy and TURBT which did not show any bladder mass  PET CT scan showed 3.7 cm right lower lobe mass with an SUV of 30.7. The mass abuts the visceral pleura without definite pleural invasion. No hypermetabolic adenopathy or evidence of distant metastatic disease. Patient underwent bronchoscopy with [name] which showed non-small cell lung cancer favoradenocarcinoma  Patient underwent resection of the solitary  left frontal lobe brain mass which was also consistent with TTF-1 positive lung adenocarcinoma.However repeat MRI brain showed possible Concern for new brain metastases and therefore patient received whole brain radiation.  Plan is for weekly carbotaxol chemotherapy followed by maintenance durvalumab.Omniseqtesting showed high tumor mutational burden and PD-L1 of 70%. No other actionable mutations noted   Interval history-reports that his appetite and nausea has improved a little bit after starting steroids.  He still reports fatigue and occasional exertional shortness of breath  ECOG PS- 2 Pain scale- 3 Opioid associated constipation- no  Review of systems- Review of Systems  Constitutional: Positive for malaise/fatigue. Negative for chills, fever and weight loss.  HENT: Negative for congestion, ear discharge and nosebleeds.   Eyes: Negative for blurred vision.  Respiratory: Positive for shortness of breath. Negative for cough, hemoptysis, sputum production and wheezing.   Cardiovascular: Negative for chest pain, palpitations, orthopnea and claudication.  Gastrointestinal: Negative for abdominal pain, blood in stool, constipation, diarrhea, heartburn, melena, nausea and vomiting.  Genitourinary: Negative for dysuria, flank pain, frequency, hematuria and urgency.  Musculoskeletal: Negative for back pain, joint pain and myalgias.  Skin: Negative for rash.  Neurological: Negative for dizziness, tingling, focal weakness, seizures, weakness and headaches.  Endo/Heme/Allergies: Does not bruise/bleed easily.  Psychiatric/Behavioral: Negative for depression and suicidal ideas. The patient does not have insomnia.       Allergies  Allergen Reactions  . Penicillins Other (See Comments)    unknown     Past Medical History:  Diagnosis Date  . Diabetes mellitus without complication ([place])   . Hyperlipidemia   . Hypertension   . Lesion of brain   . Lung cancer ([place])   . Mass of lung  Past Surgical History:  Procedure Laterality Date  . 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 COLONOSCOPY    . CYSTOSCOPY W/ RETROGRADES Bilateral 07/14/2020   Procedure: CYSTOSCOPY WITH RETROGRADE PYELOGRAM;  Surgeon: Hollice Espy, MD;  Location: [facility] ORS;  Service: Urology;  Laterality: Bilateral;  . PORTA CATH INSERTION N/A 08/03/2020   Procedure: PORTA CATH INSERTION;  Surgeon: Katha Cabal, MD;  Location: [place] [facility];  Service: Cardiovascular;  Laterality: N/A;  . PORTA CATH INSERTION N/A 10/03/2020   Procedure: PORTA CATH INSERTION;  Surgeon: Algernon Huxley, MD;  Location: [place] [facility];  Service: Cardiovascular;  Laterality: N/A;  . VIDEO BRONCHOSCOPY WITH ENDOBRONCHIAL NAVIGATION N/A 07/01/2020   Procedure: VIDEO BRONCHOSCOPY WITH ENDOBRONCHIAL NAVIGATION;  Surgeon: [name], MD;  Location: [facility] ORS;  Service: Thoracic;  Laterality: N/A;  . VIDEO BRONCHOSCOPY WITH ENDOBRONCHIAL ULTRASOUND N/A 07/01/2020   Procedure: VIDEO BRONCHOSCOPY WITH ENDOBRONCHIAL ULTRASOUND;  Surgeon: [name], MD;  Location: [facility] ORS;  Service: Thoracic;  Laterality: N/A;    Social History   Socioeconomic History  . Marital status: Divorced    Spouse name: Not on file  . Number of children: Not on file  . Years of education: Not on file  . Highest education level: Not on file  Occupational History  . Not on file  Tobacco Use  . Smoking status: Former Smoker    Packs/day: 2.00    Years: 32.00    Pack years: 64.00    Types: Cigarettes    Quit date: 06/21/2020    Years since quitting: 0.3  . Smokeless tobacco: Never Used  Vaping Use  . Vaping Use: Never used  Substance and Sexual Activity  . Alcohol use: No    Alcohol/week: 0.0 standard drinks  . Drug use: Not Currently    Types: Marijuana  . Sexual activity: Not on file  Other Topics Concern  . Not on file  Social History Narrative   Lives at home with girlfriend   Social Determinants of Health   Financial  Resource Strain:   . Difficulty of Paying Living Expenses: Not on file  Food Insecurity:   . Worried About Charity fundraiser in the Last Year: Not on file  . Ran Out of Food in the Last Year: Not on file  Transportation Needs:   . Lack of Transportation (Medical): Not on file  . Lack of Transportation (Non-Medical): Not on file  Physical Activity:   . Days of Exercise per Week: Not on file  . Minutes of Exercise per Session: Not on file  Stress:   . Feeling of Stress : Not on file  Social Connections:   . Frequency of Communication with Friends and Family: Not on file  . Frequency of Social Gatherings with Friends and Family: Not on file  . Attends Religious Services: Not on file  . Active Member of Clubs or Organizations: Not on file  . Attends Archivist Meetings: Not on file  . Marital Status: Not on file  Intimate Partner Violence:   . Fear of Current or Ex-Partner: Not on file  . Emotionally Abused: Not on file  . Physically Abused: Not on file  . Sexually Abused: Not on file    Family History  Problem Relation Age of Onset  . Diabetes Sister   . Diabetes Brother      Current Outpatient Medications:  .  LORazepam (ATIVAN) 0.5 MG tablet, Take 1 tablet (0.5 mg total) by mouth  every 8 (eight) hours as needed for anxiety., Disp: 30 tablet, Rfl: 0 .  OLANZapine (ZYPREXA) 10 MG tablet, Take 1 tablet (10 mg total) by mouth at bedtime., Disp: 30 tablet, Rfl: 1 .  potassium chloride SA (KLOR-CON) 20 MEQ tablet, Take 1 tablet (20 mEq total) by mouth 2 (two) times daily., Disp: 28 tablet, Rfl: 0 .  vitamin B-12 (CYANOCOBALAMIN) 1000 MCG tablet, Take 1,000 mcg by mouth daily., Disp: , Rfl:  .  atorvastatin (LIPITOR) 10 MG tablet, Take 10 mg by mouth every morning.  (Patient not taking: Reported on 10/27/2020), Disp: , Rfl:  .  calcium carbonate (TUMS EX) 750 MG chewable tablet, Chew 1 tablet by mouth 3 (three) times daily as needed for heartburn.  (Patient not taking:  Reported on 10/27/2020), Disp: , Rfl:  .  [employer] SENNA PLUS 8.6-50 MG tablet, Take 2 tablets by mouth at bedtime as needed for mild constipation.  (Patient not taking: Reported on 10/27/2020), Disp: , Rfl:  .  gabapentin (NEURONTIN) 300 MG capsule, Take 900 mg by mouth 3 (three) times daily. (Patient not taking: Reported on 10/17/2020), Disp: , Rfl:  .  gemfibrozil (LOPID) 600 MG tablet, Take 300 mg by mouth 2 (two) times daily.  (Patient not taking: Reported on 10/17/2020), Disp: , Rfl:  .  lidocaine-prilocaine (EMLA) cream, Apply to affected area once (Patient not taking: Reported on 10/17/2020), Disp: 30 g, Rfl: 3 .  loperamide (IMODIUM) 2 MG capsule, Take 2 mg by mouth as needed for diarrhea or loose stools. (Patient not taking: Reported on 10/27/2020), Disp: , Rfl:  .  metFORMIN (GLUCOPHAGE-XR) 500 MG 24 hr tablet, Take 500 mg by mouth in the morning and at bedtime.  (Patient not taking: Reported on 10/13/2020), Disp: , Rfl:  .  metoprolol tartrate (LOPRESSOR) 25 MG tablet, Take 0.5 tablets (12.5 mg total) by mouth 2 (two) times daily. (Patient not taking: Reported on 10/27/2020), Disp: 14 tablet, Rfl: 0 .  ondansetron (ZOFRAN) 4 MG tablet, Take 4 mg by mouth every 8 (eight) hours as needed.  (Patient not taking: Reported on 10/27/2020), Disp: , Rfl:  .  ondansetron (ZOFRAN) 8 MG tablet, Take 1 tablet (8 mg total) by mouth 2 (two) times daily as needed for refractory nausea / vomiting. Start on day 3 after chemo. (Patient not taking: Reported on 10/27/2020), Disp: 30 tablet, Rfl: 1 .  oxyCODONE (OXY IR/ROXICODONE) 5 MG immediate release tablet, Take 1 tablet (5 mg total) by mouth every 6 (six) hours as needed for severe pain. (Patient not taking: Reported on 09/06/2020), Disp: 30 tablet, Rfl: 0 .  pantoprazole (PROTONIX) 20 MG tablet, Take 1 tablet (20 mg total) by mouth daily. (Patient not taking: Reported on 09/06/2020), Disp: 30 tablet, Rfl: 1 .  pantoprazole (PROTONIX) 40 MG tablet, Take 40 mg by mouth  daily. (Patient not taking: Reported on 10/27/2020), Disp: , Rfl:  .  polyethylene glycol powder (GLYCOLAX/MIRALAX) 17 GM/SCOOP powder, Take by mouth. (Patient not taking: Reported on 08/18/2020), Disp: , Rfl:  .  predniSONE (DELTASONE) 10 MG tablet, Take 1 tablet (10 mg total) by mouth 2 (two) times daily with a meal. (Patient not taking: Reported on 10/20/2020), Disp: 14 tablet, Rfl: 0 .  prochlorperazine (COMPAZINE) 10 MG tablet, Take 1 tablet (10 mg total) by mouth every 6 (six) hours as needed (Nausea or vomiting). (Patient not taking: Reported on 10/27/2020), Disp: 30 tablet, Rfl: 1 .  triamcinolone ointment (KENALOG) 0.5 %, Apply 1 application topically 2 (two) times  daily. (Patient not taking: Reported on 10/27/2020), Disp: 30 g, Rfl: 0 No current facility-administered medications for this visit.  Facility-Administered Medications Ordered in Other Visits:  .  heparin lock flush 100 unit/mL, 500 Units, Intravenous, Once, Brahmanday, Govinda R, MD .  sodium chloride flush (NS) 0.9 % injection 10 mL, 10 mL, Intravenous, Once, Cammie Sickle, MD  Physical exam:  Vitals:   10/27/20 0947  BP: 100/77  Pulse: (!) 110  Resp: 16  Temp: (!) 97.1 F (36.2 C)  TempSrc: Tympanic  SpO2: 99%  Weight: 197 lb 11.2 oz (89.7 kg)   Physical Exam Constitutional:      Comments: Patient is sitting in a wheelchair and appears in no acute distress  HENT:     Head: Normocephalic and atraumatic.  Eyes:     Pupils: Pupils are equal, round, and reactive to light.  Cardiovascular:     Rate and Rhythm: Regular rhythm. Tachycardia present.     Heart sounds: Normal heart sounds.  Pulmonary:     Effort: Pulmonary effort is normal.     Breath sounds: Normal breath sounds.  Abdominal:     General: Bowel sounds are normal.     Palpations: Abdomen is soft.  Musculoskeletal:     Cervical back: Normal range of motion.     Comments: S/p left AKA  Skin:    General: Skin is warm and dry.  Neurological:      Mental Status: He is alert and oriented to person, place, and time.      CMP Latest Ref Rng & Units 10/27/2020  Glucose 70 - 99 mg/dL 113(H)  BUN 6 - 20 mg/dL 6  Creatinine 0.61 - 1.24 mg/dL 0.76  Sodium 135 - 145 mmol/L 135  Potassium 3.5 - 5.1 mmol/L 2.7(LL)  Chloride 98 - 111 mmol/L 98  CO2 22 - 32 mmol/L 28  Calcium 8.9 - 10.3 mg/dL 8.4(L)  Total Protein 6.5 - 8.1 g/dL 7.0  Total Bilirubin 0.3 - 1.2 mg/dL 1.5(H)  Alkaline Phos 38 - 126 U/L 61  AST 15 - 41 U/L 19  ALT 0 - 44 U/L 26   CBC Latest Ref Rng & Units 10/27/2020  WBC 4.0 - 10.5 K/uL 2.6(L)  Hemoglobin 13.0 - 17.0 g/dL 12.2(L)  Hematocrit 39 - 52 % 35.9(L)  Platelets 150 - 400 K/uL 196    No images are attached to the encounter.  PERIPHERAL VASCULAR CATHETERIZATION  Result Date: 10/03/2020 See op note  ECHOCARDIOGRAM COMPLETE  Result Date: 10/26/2020    ECHOCARDIOGRAM REPORT   Patient Name:   MICHEL HENDON Date of Exam: 10/26/2020 Medical Rec #:  [id number]        Height:       75.0 in Accession #:    [id number]       Weight:       208.0 lb Date of Birth:  [birth year]        BSA:          2.231 m Patient Age:    59 years         BP:           144/76 mmHg Patient Gender: M                HR:           97 bpm. Exam Location:  [facility] Procedure: 2D Echo, Cardiac Doppler, Color Doppler and Intracardiac            Opacification Agent  Indications:     Palpitations 785.1  History:         Patient has prior history of Echocardiogram examinations, most                  recent 01/02/2016. Risk Factors:Hypertension, Dyslipidemia and                  Diabetes. Lung cancer.  Sonographer:     Sherrie Sport RDCS (AE) Referring Phys:  [id number] [facility] Treydon Henricks Diagnosing Phys: Bartholome Bill MD  Sonographer Comments: Technically difficult study due to poor echo windows, no apical window and no subcostal window. IMPRESSIONS  1. Left ventricular ejection fraction, by estimation, is 30 to 35%. The left ventricle has moderately decreased function.  The left ventricle demonstrates regional wall motion abnormalities (see scoring diagram/findings for description). The left ventricular internal cavity size was mildly dilated. Left ventricular diastolic parameters were normal.  2. Right ventricular systolic function is normal. The right ventricular size is normal.  3. Left atrial size was mildly dilated.  4. The mitral valve was not well visualized. Trivial mitral valve regurgitation.  5. The aortic valve was not well visualized. Aortic valve regurgitation is not visualized. FINDINGS  Left Ventricle: Left ventricular ejection fraction, by estimation, is 30 to 35%. The left ventricle has moderately decreased function. The left ventricle demonstrates regional wall motion abnormalities. Definity contrast agent was given IV to delineate the left ventricular endocardial borders. The left ventricular internal cavity size was mildly dilated. There is no left ventricular hypertrophy. Left ventricular diastolic parameters were normal.  LV Wall Scoring: The apical lateral segment, apical septal segment, and apex are akinetic. The antero-lateral wall, mid inferoseptal segment, and basal inferoseptal segment are hypokinetic. Right Ventricle: The right ventricular size is normal. No increase in right ventricular wall thickness. Right ventricular systolic function is normal. Left Atrium: Left atrial size was mildly dilated. Right Atrium: Right atrial size was normal in size. Pericardium: There is no evidence of pericardial effusion. Mitral Valve: The mitral valve was not well visualized. Trivial mitral valve regurgitation. Tricuspid Valve: The tricuspid valve is not well visualized. Tricuspid valve regurgitation is trivial. Aortic Valve: The aortic valve was not well visualized. Aortic valve regurgitation is not visualized. Pulmonic Valve: The pulmonic valve was not well visualized. Pulmonic valve regurgitation is not visualized. Aorta: The aortic root is normal in size and  structure. IAS/Shunts: The interatrial septum was not assessed.  LEFT VENTRICLE PLAX 2D LVIDd:         4.49 cm LVIDs:         3.77 cm LV PW:         1.11 cm LV IVS:        1.26 cm LVOT diam:     2.10 cm LVOT Area:     3.46 cm  LEFT ATRIUM         Index LA diam:    2.50 cm 1.12 cm/m                        PULMONIC VALVE AORTA                 PV Vmax:        0.47 m/s Ao Root diam: 3.30 cm PV Peak grad:   0.9 mmHg                       RVOT Peak grad: 1 mmHg   SHUNTS  Systemic Diam: 2.10 cm Bartholome Bill MD Electronically signed by Bartholome Bill MD Signature Date/Time: 10/26/2020/2:57:45 PM    Final      Assessment and plan- Patient is a 59 y.o. male with stage IVacT2 cN0 cM1 adenocarcinoma of the right lung with isolated left frontal lobe metastasess/p resection of brain met.This was followed by whole brain radiation as there was concern for new areas of brain metastases.  He is currently undergoing concurrent chemoradiation and is here for routine follow-up  Patient completed 6 cycles of carboplatin and Taxol chemotherapy last week and is completing radiation this week.  Plan is to repeat CT chest abdomen and pelvis with contrast in 2 weeks time.  Patient has chemo-induced neutropenia which will likely resolve in the next couple of weeks as he has not been to be getting any more chemotherapy.  Hypokalemia we will give him 1 L of IV fluids with IV potassium today  Repeat CMP and possible IV fluids and potassium next week.  Exertional shortness of breath and tachycardia: We had obtained an echocardiogram which showed wall motion abnormalities and an EF of 30 to 35%.  I will refer him to cardiology for further work-up.  Patient does not report any active chest pain at this time  I will see him back in 2 weeks time to discuss CT scan results and further management   Visit Diagnosis 1. Dehydration   2. Tachycardia   3. Hypokalemia   4. Abnormal echocardiogram      Dr. Randa Evens, MD,  [facility] [phone] 10/29/2020 3:21 PM

## 2020-10-31 ENCOUNTER — Encounter: Payer: Self-pay | Admitting: Cardiology

## 2020-10-31 ENCOUNTER — Other Ambulatory Visit: Payer: Self-pay

## 2020-10-31 ENCOUNTER — Ambulatory Visit (INDEPENDENT_AMBULATORY_CARE_PROVIDER_SITE_OTHER): Payer: Medicare HMO | Admitting: Cardiology

## 2020-10-31 VITALS — BP 100/80 | HR 134 | Ht 75.0 in | Wt 210.0 lb

## 2020-10-31 DIAGNOSIS — R000 Tachycardia, unspecified: Secondary | ICD-10-CM

## 2020-10-31 DIAGNOSIS — I5020 Unspecified systolic (congestive) heart failure: Secondary | ICD-10-CM

## 2020-10-31 MED ORDER — IVABRADINE HCL 5 MG PO TABS: 5.0000 mg | ORAL_TABLET | Freq: Two times a day (BID) | ORAL | 5 refills | Status: DC

## 2020-10-31 NOTE — Patient Instructions (Signed)
Medication Instructions:   Your physician has recommended you make the following change in your medication:   1.  START taking ivabradine (CORLANOR) 5 MG TABS tablet:  Take 1 tablet (5 mg total) by mouth 2 (two) times daily with a meal.  *If you need a refill on your cardiac medications before your next appointment, please call your pharmacy*   Lab Work: None Ordered. If you have labs (blood work) drawn today and your tests are completely normal, you will receive your results only by: Marland Kitchen MyChart Message (if you have MyChart) OR . A paper copy in the mail If you have any lab test that is abnormal or we need to change your treatment, we will call you to review the results.   Testing/Procedures:   [place]     Your caregiver has ordered a Stress Test with nuclear imaging. The purpose of this test is to evaluate the blood supply to your heart muscle. This procedure is referred to as a "Non-Invasive Stress Test." This is because other than having an IV started in your vein, nothing is inserted or "invades" your body. Cardiac stress tests are done to find areas of poor blood flow to the heart by determining the extent of coronary artery disease (CAD). Some patients exercise on a treadmill, which naturally increases the blood flow to your heart, while others who are  unable to walk on a treadmill due to physical limitations have a pharmacologic/chemical stress agent called Lexiscan . This medicine will mimic walking on a treadmill by temporarily increasing your coronary blood flow.      PLEASE REPORT TO [facility] ENTR[facility]   THE VOLUNTEERS AT THE FIRST DESK WILL DIRECT YOU WHERE TO GO     *Please note: these test may take anywhere between 2-4 hours to complete       Date of Procedure:_____________________________________   Arrival Time for Procedure:______________________________    PLEASE NOTIFY THE OFFICE AT LEAST 24 HOURS IN ADVANCE IF YOU ARE UNABLE TO KEEP YOUR  APPOINTMENT.  [place] 24 HOURS IN ADVANCE IF YOU ARE UNABLE TO KEEP YOUR APPOINTMENT. [phone]      How to prepare for your Myoview test:          1. Do not eat or drink after midnight  2. No caffeine for 24 hours prior to test  3. No smoking 24 hours prior to test.  4. Unless instructed otherwise, Take your medication with a small sips of water.    5. No perfume, cologne or lotion.     Follow-Up: At [facility], you and your health needs are our priority.  As part of our continuing mission to provide you with exceptional heart care, we have created designated Provider Care Teams.  These Care Teams include your primary Cardiologist (physician) and Advanced Practice Providers (APPs -  Physician Assistants and Nurse Practitioners) who all work together to provide you with the care you need, when you need it.  We recommend signing up for the patient portal called "MyChart".  Sign up information is provided on this After Visit Summary.  MyChart is used to connect with patients for Virtual Visits (Telemedicine).  Patients are able to view lab/test results, encounter notes, upcoming appointments, etc.  Non-urgent messages can be sent to your provider as well.   To learn more about what you can do with MyChart, go to [email or web address].    Your next appointment:   After  Myoview   The format for your next appointment:   In Person  Provider:   Kate Sable, MD   Other Instructions

## 2020-10-31 NOTE — Progress Notes (Addendum)
Cardiology Office Note:    Date:  10/31/2020   ID:  Tyler Gallagher, DOB 29-Dec-1961, MRN [id number]  PCP:  Juluis Pitch, MD  [facility] Cardiologist:  Kate Sable, MD  [place] Electrophysiologist:  None   Referring MD: [name], MD   Chief Complaint  Patient presents with  . OTHER    Low EF per cancer center c/o sob and discuss portal due to red lines. Meds reviewed verbally with pt.    History of Present Illness:    Tyler Gallagher is a 59 y.o. 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  She had a chest CTA on 06/17/2020 due to shortness of breath.  Right lower lobe mass was noted.  Subsequent MRI to the brain showed left frontal lobe mass.    She subsequently underwent brain resection of mass, with pathology revealing lung adenocarcinoma.  Currently on chemoradiation by oncology.  Plan for weekly carbotaxol chemo.  Patient was noted to have shortness of breath and tachycardia. Patient had a transthoracic echo on 09/2020 due to palpitations.  Ejection fraction was moderately to severely reduced at 30 to 35%.  He states feeling okay prior to having lung cancer and starting chemo.  Endorses shortness of breath and increased heart rate since starting chemo.  Metoprolol was initially started, but stopped due to low normal blood pressures.  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  Denies any history of heart disease.  Past Medical History:  Diagnosis Date  . Diabetes mellitus without complication ([place])   . Hyperlipidemia   . Hypertension   . Lesion of brain   . Lung cancer ([place])   . Mass of lung     Past Surgical History:  Procedure Laterality Date  . 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 COLONOSCOPY    . CYSTOSCOPY W/ RETROGRADES Bilateral 07/14/2020   Procedure: CYSTOSCOPY WITH RETROGRADE  PYELOGRAM;  Surgeon: Hollice Espy, MD;  Location: [facility] OR[facility];  Service: Urology;  Laterality: Bilateral;  . PORTA CATH INSERTION N/A 08/03/2020   Procedure: PORTA CATH INSERTION;  Surgeon: Katha Cabal, MD;  Location: [place] [facility];  Service: Cardiovascular;  Laterality: N/A;  . PORTA CATH INSERTION N/A 10/03/2020   Procedure: PORTA CATH INSERTION;  Surgeon: Algernon Huxley, MD;  Location: [place] [facility];  Service: Cardiovascular;  Laterality: N/A;  . VIDEO BRONCHOSCOPY WITH ENDOBRONCHIAL NAVIGATION N/A 07/01/2020   Procedure: VIDEO BRONCHOSCOPY WITH ENDOBRONCHIAL NAVIGATION;  Surgeon: [name], MD;  Location: [facility];  Service: Thoracic;  Laterality: N/A;  . VIDEO BRONCHOSCOPY WITH ENDOBRONCHIAL ULTRASOUND N/A 07/01/2020   Procedure: VIDEO BRONCHOSCOPY WITH ENDOBRONCHIAL ULTRASOUND;  Surgeon: [name], MD;  Location: [facility];  Service: Thoracic;  Laterality: N/A;    Current Medications: Current Meds  Medication Sig  . CVS SENNA PLUS 8.6-50 MG tablet Take 2 tablets by mouth at bedtime as needed for mild constipation.   . lidocaine-prilocaine (EMLA) cream Apply to affected area once  . loperamide (IMODIUM) 2 MG capsule Take 2 mg by mouth as needed for diarrhea or loose stools.   Marland Kitchen LORazepam (ATIVAN) 0.5 MG tablet Take 1 tablet (0.5 mg total) by mouth every 8 (eight) hours as needed for anxiety.  Marland Kitchen OLANZapine (ZYPREXA) 10 MG tablet Take 1 tablet (10 mg total) by mouth at bedtime.  Marland Kitchen  ondansetron (ZOFRAN) 4 MG tablet Take 4 mg by mouth every 8 (eight) hours as needed.   . ondansetron (ZOFRAN) 8 MG tablet Take 1 tablet (8 mg total) by mouth 2 (two) times daily as needed for refractory nausea / vomiting. Start on day 3 after chemo.  Marland Kitchen oxyCODONE (OXY IR/ROXICODONE) 5 MG immediate release tablet Take 1 tablet (5 mg total) by mouth every 6 (six) hours as needed for severe pain.  . potassium chloride SA (KLOR-CON) 20 MEQ tablet Take 1 tablet (20 mEq total) by mouth 2 (two) times  daily.  . prochlorperazine (COMPAZINE) 10 MG tablet Take 1 tablet (10 mg total) by mouth every 6 (six) hours as needed (Nausea or vomiting).  . triamcinolone ointment (KENALOG) 0.5 % Apply 1 application topically 2 (two) times daily.  . vitamin B-12 (CYANOCOBALAMIN) 1000 MCG tablet Take 1,000 mcg by mouth daily.     Allergies:   Penicillins   Social History   Socioeconomic History  . Marital status: Divorced    Spouse name: Not on file  . Number of children: Not on file  . Years of education: Not on file  . Highest education level: Not on file  Occupational History  . Not on file  Tobacco Use  . Smoking status: Former Smoker    Packs/day: 2.00    Years: 32.00    Pack years: 64.00    Types: Cigarettes    Quit date: 06/21/2020    Years since quitting: 0.3  . Smokeless tobacco: Never Used  Vaping Use  . Vaping Use: Never used  Substance and Sexual Activity  . Alcohol use: No    Alcohol/week: 0.0 standard drinks  . Drug use: Not Currently    Types: Marijuana  . Sexual activity: Not on file  Other Topics Concern  . Not on file  Social History Narrative   Lives at home with girlfriend   Social Determinants of Health   Financial Resource Strain:   . Difficulty of Paying Living Expenses: Not on file  Food Insecurity:   . Worried About Charity fundraiser in the Last Year: Not on file  . Ran Out of Food in the Last Year: Not on file  Transportation Needs:   . Lack of Transportation (Medical): Not on file  . Lack of Transportation (Non-Medical): Not on file  Physical Activity:   . Days of Exercise per Week: Not on file  . Minutes of Exercise per Session: Not on file  Stress:   . Feeling of Stress : Not on file  Social Connections:   . Frequency of Communication with Friends and Family: Not on file  . Frequency of Social Gatherings with Friends and Family: Not on file  . Attends Religious Services: Not on file  . Active Member of Clubs or Organizations: Not on file  .  Attends Archivist Meetings: Not on file  . Marital Status: Not on file     Family History: The patient's family history includes Diabetes in his brother and sister.  ROS:   Please see the history of present illness.     All other systems reviewed and are negative.  EKGs/Labs/Other Studies Reviewed:    The following studies were reviewed today:   EKG:  EKG is  ordered today.  The ekg ordered today demonstrates sinus tachycardia  Recent Labs: 10/27/2020: ALT 26; BUN 6; Creatinine, Ser 0.76; Hemoglobin 12.2; Platelets 196; Potassium 2.7; Sodium 135  Recent Lipid Panel    Component Value  Date/Time   CHOL 233 (H) 01/01/2016 2310   TRIG 514 (H) 01/01/2016 2310   HDL 26 (L) 01/01/2016 2310   CHOLHDL 9.0 01/01/2016 2310   VLDL UNABLE TO CALCULATE IF TRIGLYCERIDE OVER 400 mg/dL 01/01/2016 2310   LDLCALC UNABLE TO CALCULATE IF TRIGLYCERIDE OVER 400 mg/dL 01/01/2016 2310     Risk Assessment/Calculations:      Physical Exam:    VS:  BP 100/80 (BP Location: Right Arm, Patient Position: Sitting, Cuff Size: Normal)   Pulse (!) 134   Ht 6\' 3"  (1.905 m)   Wt 210 lb (95.3 kg)   SpO2 98%   BMI 26.25 kg/m     Wt Readings from Last 3 Encounters:  10/31/20 210 lb (95.3 kg)  10/27/20 197 lb 11.2 oz (89.7 kg)  10/20/20 208 lb (94.3 kg)     GEN:  Well nourished, well developed in no acute distress HEENT: Head scar secondary to prior surgery noted NECK: No JVD; No carotid bruits LYMPHATICS: No lymphadenopathy CARDIAC: Tachycardic, no murmurs RESPIRATORY:  Clear to auscultation without rales, wheezing or rhonchi  ABDOMEN: Soft, non-tender, non-distended MUSCULOSKELETAL:  No edema; above-knee left lower extremity amputation noted SKIN: Warm and dry NEUROLOGIC:  Alert and oriented x 3 PSYCHIATRIC:  Normal affect   ASSESSMENT:    1. HFrEF (heart failure with reduced ejection fraction) ([place])   2. Inappropriate sinus tachycardia    PLAN:    In order of problems  listed above:  1. Patient with moderate to severely reduced ejection fraction, EF 30 to 35%.  NYHA class II-III symptoms.  Symptoms likely secondary to chemo as patient was on Taxol and carboplatin which are both known to be cardiotoxic.  He had risk factors of hypertension, hyperlipidemia, smoker, as such CAD could also be contributing.  Will evaluate for presence of ischemia with Lexiscan Myoview.  We will not start heart failure medications due to low normal blood pressure.  He is euvolemic. 2. Inappropriate sinus tachycardia, Metroprolol could not be prescribed due to low blood pressures.  Start ivabradine 5 mg twice daily.  follow up after [facility].   Medication Adjustments/Labs and Tests Ordered: Current medicines are reviewed at length with the patient today.  Concerns regarding medicines are outlined above.  Orders Placed This Encounter  Procedures  . NM Myocar Multi W/Spect W/Wall Motion / EF  . EKG 12-Lead   Meds ordered this encounter  Medications  . ivabradine (CORLANOR) 5 MG TABS tablet    Sig: Take 1 tablet (5 mg total) by mouth 2 (two) times daily with a meal.    Dispense:  60 tablet    Refill:  5    Patient Instructions  Medication Instructions:   Your physician has recommended you make the following change in your medication:   1.  START taking ivabradine (CORLANOR) 5 MG TABS tablet:  Take 1 tablet (5 mg total) by mouth 2 (two) times daily with a meal.  *If you need a refill on your cardiac medications before your next appointment, please call your pharmacy*   Lab Work: None Ordered. If you have labs (blood work) drawn today and your tests are completely normal, you will receive your results only by: Marland Kitchen MyChart Message (if you have MyChart) OR . A paper copy in the mail If you have any lab test that is abnormal or we need to change your treatment, we will call you to review the results.   Testing/Procedures:   [facility] MYOVIEW  Your caregiver has  ordered a Stress Test with nuclear imaging. The purpose of this test is to evaluate the blood supply to your heart muscle. This procedure is referred to as a "Non-Invasive Stress Test." This is because other than having an IV started in your vein, nothing is inserted or "invades" your body. Cardiac stress tests are done to find areas of poor blood flow to the heart by determining the extent of coronary artery disease (CAD). Some patients exercise on a treadmill, which naturally increases the blood flow to your heart, while others who are  unable to walk on a treadmill due to physical limitations have a pharmacologic/chemical stress agent called Lexiscan . This medicine will mimic walking on a treadmill by temporarily increasing your coronary blood flow.      PLEASE REPORT TO [facility] MEDICAL MALL ENTRANCE   THE VOLUNTEERS AT THE FIRST DESK WILL DIRECT YOU WHERE TO GO     *Please note: these test may take anywhere between 2-4 hours to complete       Date of Procedure:_____________________________________   Arrival Time for Procedure:______________________________    PLEASE NOTIFY THE OFFICE AT LEAST 24 HOURS IN ADVANCE IF YOU ARE UNABLE TO KEEP YOUR APPOINTMENT.  [place] 24 HOURS IN ADVANCE IF YOU ARE UNABLE TO KEEP YOUR APPOINTMENT. [phone]      How to prepare for your Myoview test:          1. Do not eat or drink after midnight  2. No caffeine for 24 hours prior to test  3. No smoking 24 hours prior to test.  4. Unless instructed otherwise, Take your medication with a small sips of water.    5. No perfume, cologne or lotion.     Follow-Up: At [facility], you and your health needs are our priority.  As part of our continuing mission to provide you with exceptional heart care, we have created designated Provider Care Teams.  These Care Teams include your primary Cardiologist (physician) and Advanced Practice Providers (APPs -   Physician Assistants and Nurse Practitioners) who all work together to provide you with the care you need, when you need it.  We recommend signing up for the patient portal called "MyChart".  Sign up information is provided on this After Visit Summary.  MyChart is used to connect with patients for Virtual Visits (Telemedicine).  Patients are able to view lab/test results, encounter notes, upcoming appointments, etc.  Non-urgent messages can be sent to your provider as well.   To learn more about what you can do with MyChart, go to [email or web address].    Your next appointment:   After Myoview   The format for your next appointment:   In Person  Provider:   Kate Sable, MD   Other Instructions      Signed, Kate Sable, MD  10/31/2020 5:03 PM    [place]

## 2020-11-01 ENCOUNTER — Telehealth: Payer: Self-pay | Admitting: *Deleted

## 2020-11-01 NOTE — Telephone Encounter (Signed)
Pt requiring PA for Corlanor 5 mg tablet bid. PA has been initiated but not submitted.  Please advise pt's NYHA class of heart failure.  Please check NYHA (New York Heart Association) Class, if applicable:  Class I  Class II  Class III  Class IV  None above

## 2020-11-01 NOTE — Telephone Encounter (Signed)
PA has been submitted through Covermymeds. Awaiting Approval.

## 2020-11-02 NOTE — Telephone Encounter (Signed)
Approved on November 2 PA Case: [id number] Status: Approved Starts on: 01/01/2020 - Coverage Ends on: 12/30/2021

## 2020-11-03 ENCOUNTER — Ambulatory Visit: Admission: RE | Admit: 2020-11-03 | Payer: Medicare HMO | Source: Ambulatory Visit

## 2020-11-03 ENCOUNTER — Inpatient Hospital Stay: Payer: Medicare HMO

## 2020-11-03 ENCOUNTER — Inpatient Hospital Stay: Payer: Medicare HMO | Attending: Oncology

## 2020-11-03 DIAGNOSIS — E119 Type 2 diabetes mellitus without complications: Secondary | ICD-10-CM | POA: Insufficient documentation

## 2020-11-03 DIAGNOSIS — D701 Agranulocytosis secondary to cancer chemotherapy: Secondary | ICD-10-CM | POA: Insufficient documentation

## 2020-11-03 DIAGNOSIS — E785 Hyperlipidemia, unspecified: Secondary | ICD-10-CM | POA: Insufficient documentation

## 2020-11-03 DIAGNOSIS — C3431 Malignant neoplasm of lower lobe, right bronchus or lung: Secondary | ICD-10-CM | POA: Insufficient documentation

## 2020-11-03 DIAGNOSIS — Z87891 Personal history of nicotine dependence: Secondary | ICD-10-CM | POA: Insufficient documentation

## 2020-11-03 DIAGNOSIS — E871 Hypo-osmolality and hyponatremia: Secondary | ICD-10-CM | POA: Insufficient documentation

## 2020-11-03 DIAGNOSIS — J439 Emphysema, unspecified: Secondary | ICD-10-CM | POA: Insufficient documentation

## 2020-11-03 DIAGNOSIS — Z833 Family history of diabetes mellitus: Secondary | ICD-10-CM | POA: Insufficient documentation

## 2020-11-03 DIAGNOSIS — I712 Thoracic aortic aneurysm, without rupture: Secondary | ICD-10-CM | POA: Insufficient documentation

## 2020-11-03 DIAGNOSIS — E876 Hypokalemia: Secondary | ICD-10-CM | POA: Insufficient documentation

## 2020-11-03 DIAGNOSIS — Z79899 Other long term (current) drug therapy: Secondary | ICD-10-CM | POA: Insufficient documentation

## 2020-11-03 DIAGNOSIS — I10 Essential (primary) hypertension: Secondary | ICD-10-CM | POA: Insufficient documentation

## 2020-11-07 ENCOUNTER — Telehealth: Payer: Self-pay | Admitting: *Deleted

## 2020-11-07 ENCOUNTER — Inpatient Hospital Stay: Payer: Medicare HMO

## 2020-11-07 ENCOUNTER — Other Ambulatory Visit: Payer: Self-pay

## 2020-11-07 VITALS — BP 110/81 | HR 115

## 2020-11-07 DIAGNOSIS — I10 Essential (primary) hypertension: Secondary | ICD-10-CM | POA: Diagnosis not present

## 2020-11-07 DIAGNOSIS — C3431 Malignant neoplasm of lower lobe, right bronchus or lung: Secondary | ICD-10-CM | POA: Diagnosis not present

## 2020-11-07 DIAGNOSIS — Z95828 Presence of other vascular implants and grafts: Secondary | ICD-10-CM

## 2020-11-07 DIAGNOSIS — D701 Agranulocytosis secondary to cancer chemotherapy: Secondary | ICD-10-CM | POA: Diagnosis not present

## 2020-11-07 DIAGNOSIS — Z79899 Other long term (current) drug therapy: Secondary | ICD-10-CM | POA: Diagnosis not present

## 2020-11-07 DIAGNOSIS — E860 Dehydration: Secondary | ICD-10-CM

## 2020-11-07 DIAGNOSIS — Z833 Family history of diabetes mellitus: Secondary | ICD-10-CM | POA: Diagnosis not present

## 2020-11-07 DIAGNOSIS — Z87891 Personal history of nicotine dependence: Secondary | ICD-10-CM | POA: Diagnosis not present

## 2020-11-07 DIAGNOSIS — E876 Hypokalemia: Secondary | ICD-10-CM

## 2020-11-07 DIAGNOSIS — I712 Thoracic aortic aneurysm, without rupture: Secondary | ICD-10-CM | POA: Diagnosis not present

## 2020-11-07 DIAGNOSIS — E119 Type 2 diabetes mellitus without complications: Secondary | ICD-10-CM | POA: Diagnosis not present

## 2020-11-07 DIAGNOSIS — E871 Hypo-osmolality and hyponatremia: Secondary | ICD-10-CM | POA: Diagnosis not present

## 2020-11-07 DIAGNOSIS — E785 Hyperlipidemia, unspecified: Secondary | ICD-10-CM | POA: Diagnosis not present

## 2020-11-07 DIAGNOSIS — R112 Nausea with vomiting, unspecified: Secondary | ICD-10-CM

## 2020-11-07 DIAGNOSIS — J439 Emphysema, unspecified: Secondary | ICD-10-CM | POA: Diagnosis not present

## 2020-11-07 LAB — COMPREHENSIVE METABOLIC PANEL
ALT: 10 U/L (ref 0–44)
AST: 12 U/L — ABNORMAL LOW (ref 15–41)
Albumin: 3.4 g/dL — ABNORMAL LOW (ref 3.5–5.0)
Alkaline Phosphatase: 72 U/L (ref 38–126)
Anion gap: 13 (ref 5–15)
BUN: <5 mg/dL — ABNORMAL LOW (ref 6–20)
CO2: 26 mmol/L (ref 22–32)
Calcium: 9 mg/dL (ref 8.9–10.3)
Chloride: 94 mmol/L — ABNORMAL LOW (ref 98–111)
Creatinine, Ser: 0.72 mg/dL (ref 0.61–1.24)
GFR, Estimated: >60 mL/min (ref >60)
Glucose, Bld: 93 mg/dL (ref 70–99)
Potassium: 3 mmol/L — ABNORMAL LOW (ref 3.5–5.1)
Sodium: 133 mmol/L — ABNORMAL LOW (ref 135–145)
Total Bilirubin: 1.3 mg/dL — ABNORMAL HIGH (ref 0.3–1.2)
Total Protein: 7.1 g/dL (ref 6.5–8.1)

## 2020-11-07 LAB — MAGNESIUM: Magnesium: 1.3 mg/dL — ABNORMAL LOW (ref 1.7–2.4)

## 2020-11-07 MED ORDER — SODIUM CHLORIDE 0.9 % IV SOLN
Freq: Once | INTRAVENOUS | Status: AC
  Filled 2020-11-07: qty 1000

## 2020-11-07 MED ORDER — HEPARIN SOD (PORK) LOCK FLUSH 100 UNIT/ML IV SOLN
500.0000 [IU] | Freq: Once | INTRAVENOUS | Status: AC
  Administered 2020-11-07: 500 [IU] via INTRAVENOUS
  Filled 2020-11-07: qty 5

## 2020-11-07 MED ORDER — HEPARIN SOD (PORK) LOCK FLUSH 100 UNIT/ML IV SOLN
INTRAVENOUS | Status: AC
  Filled 2020-11-07: qty 5

## 2020-11-07 MED ORDER — SODIUM CHLORIDE 0.9% FLUSH
10.0000 mL | Freq: Once | INTRAVENOUS | Status: AC
  Administered 2020-11-07: 10 mL via INTRAVENOUS
  Filled 2020-11-07: qty 10

## 2020-11-07 NOTE — Progress Notes (Signed)
Report received from K.Hinson RN - patient completed K/Mag infusion in 1 liter NS. Following infusion, VS BP 110/81 (BP Location: Left Arm, Patient Position: Sitting)   Pulse (!) 115   SpO2 100%  - patient with no chest pain/shortness of breath, feeling a little better following fluids. [name] notified - patient has stress test tomorrow and sees [name] on Thursday. OK for discharge. Discharged home via wheelchair.

## 2020-11-07 NOTE — Telephone Encounter (Signed)
Cmp and mag. No cbc

## 2020-11-07 NOTE — Telephone Encounter (Signed)
Does he want to come for IV fluids today if there is room?

## 2020-11-07 NOTE — Telephone Encounter (Signed)
Pt called in to report that has been nauseated with decreased appetite since drinking contrast last week prior to CT scan. States that he was unable to go for his scan due to feeling nauseated. Has attempted to eat over the weekend but unable to keep anything down. Pt has taken his nausea medication this morning and is feeling better. Pt encouraged to continue to take nausea medication, drink plenty of fluids as tolerated, and to eat bland foods.   Pt has scheduled appt to follow up on Thurs 11/11.   [name]- do you have any further recommendations?

## 2020-11-07 NOTE — Progress Notes (Signed)
Nutrition Follow-up:   Patient with stage IV lung cancer.  Patient has completed chemotherapy and radiation therapy.   Met with patient in infusion during IV fluids.  Patient having increased nausea and vomiting after drinking contrast for recent CT scan.  Limited intake over the weekend.  Reports today has only had 1/2 ice cream sandwich, water, few sips of coffee and pepsi.      Medications: reviewed  Labs: K 3.0, Na 133, Mag 1.3  Anthropometrics:   Weight 210 lb on 11/1 stable from weight on 10/11  213 lb on 9/30   NUTRITION DIAGNOSIS: Inadequate oral intake continues   INTERVENTION:  Discussed strategies to help with nausea and vomiting. Handout provided to patient.  Encouraged small frequent snacks of bland foods.   Encouraged hydration.  Samples of ensure clear and boost soothe given to patient.      MONITORING, EVALUATION, GOAL: weight trends, intake   NEXT VISIT: Monday, Dec 6 th after Dr. Amedeo Kinsman B. Zenia Resides, [place], [place] Registered Dietitian [phone] (mobile)

## 2020-11-07 NOTE — Telephone Encounter (Signed)
Infusion can add pt on today at 2pm and pt is agreeable. Do you want to check any labs prior to his IVF?

## 2020-11-08 ENCOUNTER — Observation Stay: Payer: Medicare HMO

## 2020-11-08 ENCOUNTER — Encounter: Admission: RE | Admit: 2020-11-08 | Payer: Medicare HMO | Source: Ambulatory Visit

## 2020-11-08 ENCOUNTER — Observation Stay
Admission: EM | Admit: 2020-11-08 | Discharge: 2020-11-10 | Disposition: A | Discharge status: Home / Self Care | Payer: Medicare HMO | Attending: Internal Medicine | Admitting: Internal Medicine

## 2020-11-08 ENCOUNTER — Emergency Department: Payer: Medicare HMO

## 2020-11-08 ENCOUNTER — Other Ambulatory Visit: Payer: Self-pay

## 2020-11-08 DIAGNOSIS — E876 Hypokalemia: Secondary | ICD-10-CM | POA: Diagnosis present

## 2020-11-08 DIAGNOSIS — J439 Emphysema, unspecified: Secondary | ICD-10-CM | POA: Insufficient documentation

## 2020-11-08 DIAGNOSIS — C7931 Secondary malignant neoplasm of brain: Secondary | ICD-10-CM | POA: Diagnosis present

## 2020-11-08 DIAGNOSIS — Z79899 Other long term (current) drug therapy: Secondary | ICD-10-CM | POA: Diagnosis not present

## 2020-11-08 DIAGNOSIS — Z85118 Personal history of other malignant neoplasm of bronchus and lung: Secondary | ICD-10-CM | POA: Insufficient documentation

## 2020-11-08 DIAGNOSIS — E785 Hyperlipidemia, unspecified: Secondary | ICD-10-CM | POA: Diagnosis present

## 2020-11-08 DIAGNOSIS — G459 Transient cerebral ischemic attack, unspecified: Secondary | ICD-10-CM | POA: Diagnosis not present

## 2020-11-08 DIAGNOSIS — C3431 Malignant neoplasm of lower lobe, right bronchus or lung: Secondary | ICD-10-CM | POA: Diagnosis present

## 2020-11-08 DIAGNOSIS — C3490 Malignant neoplasm of unspecified part of unspecified bronchus or lung: Secondary | ICD-10-CM | POA: Diagnosis not present

## 2020-11-08 DIAGNOSIS — R197 Diarrhea, unspecified: Secondary | ICD-10-CM | POA: Diagnosis present

## 2020-11-08 DIAGNOSIS — R42 Dizziness and giddiness: Secondary | ICD-10-CM

## 2020-11-08 DIAGNOSIS — K769 Liver disease, unspecified: Secondary | ICD-10-CM | POA: Diagnosis not present

## 2020-11-08 DIAGNOSIS — A0472 Enterocolitis due to Clostridium difficile, not specified as recurrent: Principal | ICD-10-CM | POA: Insufficient documentation

## 2020-11-08 DIAGNOSIS — R112 Nausea with vomiting, unspecified: Secondary | ICD-10-CM | POA: Diagnosis not present

## 2020-11-08 DIAGNOSIS — Z20822 Contact with and (suspected) exposure to covid-19: Secondary | ICD-10-CM | POA: Diagnosis not present

## 2020-11-08 DIAGNOSIS — I959 Hypotension, unspecified: Secondary | ICD-10-CM | POA: Diagnosis present

## 2020-11-08 DIAGNOSIS — E119 Type 2 diabetes mellitus without complications: Secondary | ICD-10-CM | POA: Diagnosis not present

## 2020-11-08 DIAGNOSIS — I708 Atherosclerosis of other arteries: Secondary | ICD-10-CM | POA: Diagnosis not present

## 2020-11-08 DIAGNOSIS — Z87891 Personal history of nicotine dependence: Secondary | ICD-10-CM | POA: Insufficient documentation

## 2020-11-08 DIAGNOSIS — Z515 Encounter for palliative care: Secondary | ICD-10-CM | POA: Diagnosis not present

## 2020-11-08 DIAGNOSIS — I5022 Chronic systolic (congestive) heart failure: Secondary | ICD-10-CM | POA: Diagnosis not present

## 2020-11-08 DIAGNOSIS — I110 Hypertensive heart disease with heart failure: Secondary | ICD-10-CM | POA: Insufficient documentation

## 2020-11-08 DIAGNOSIS — Z8673 Personal history of transient ischemic attack (TIA), and cerebral infarction without residual deficits: Secondary | ICD-10-CM | POA: Diagnosis not present

## 2020-11-08 DIAGNOSIS — Z85841 Personal history of malignant neoplasm of brain: Secondary | ICD-10-CM | POA: Insufficient documentation

## 2020-11-08 DIAGNOSIS — J984 Other disorders of lung: Secondary | ICD-10-CM | POA: Diagnosis not present

## 2020-11-08 DIAGNOSIS — G9389 Other specified disorders of brain: Secondary | ICD-10-CM | POA: Diagnosis not present

## 2020-11-08 DIAGNOSIS — I700 Atherosclerosis of aorta: Secondary | ICD-10-CM | POA: Diagnosis not present

## 2020-11-08 DIAGNOSIS — I712 Thoracic aortic aneurysm, without rupture: Secondary | ICD-10-CM | POA: Diagnosis not present

## 2020-11-08 DIAGNOSIS — F419 Anxiety disorder, unspecified: Secondary | ICD-10-CM | POA: Diagnosis present

## 2020-11-08 DIAGNOSIS — K8020 Calculus of gallbladder without cholecystitis without obstruction: Secondary | ICD-10-CM | POA: Diagnosis not present

## 2020-11-08 LAB — CBC WITH DIFFERENTIAL/PLATELET
Abs Immature Granulocytes: 0.01 10*3/uL (ref 0.00–0.07)
Basophils Absolute: 0 10*3/uL (ref 0.0–0.1)
Basophils Relative: 1 %
Eosinophils Absolute: 0.1 10*3/uL (ref 0.0–0.5)
Eosinophils Relative: 3 %
HCT: 35.4 % — ABNORMAL LOW (ref 39.0–52.0)
Hemoglobin: 11.7 g/dL — ABNORMAL LOW (ref 13.0–17.0)
Immature Granulocytes: 0 %
Lymphocytes Relative: 27 %
Lymphs Abs: 0.7 10*3/uL (ref 0.7–4.0)
MCH: 30.9 pg (ref 26.0–34.0)
MCHC: 33.1 g/dL (ref 30.0–36.0)
MCV: 93.4 fL (ref 80.0–100.0)
Monocytes Absolute: 0.3 10*3/uL (ref 0.1–1.0)
Monocytes Relative: 12 %
Neutro Abs: 1.4 10*3/uL — ABNORMAL LOW (ref 1.7–7.7)
Neutrophils Relative %: 57 %
Platelets: 180 10*3/uL (ref 150–400)
RBC: 3.79 MIL/uL — ABNORMAL LOW (ref 4.22–5.81)
RDW: 18.2 % — ABNORMAL HIGH (ref 11.5–15.5)
Smear Review: UNDETERMINED
WBC: 2.4 10*3/uL — ABNORMAL LOW (ref 4.0–10.5)
nRBC: 0 % (ref 0.0–0.2)

## 2020-11-08 LAB — COMPREHENSIVE METABOLIC PANEL
ALT: 10 U/L (ref 0–44)
AST: 12 U/L — ABNORMAL LOW (ref 15–41)
Albumin: 3.1 g/dL — ABNORMAL LOW (ref 3.5–5.0)
Alkaline Phosphatase: 67 U/L (ref 38–126)
Anion gap: 17 — ABNORMAL HIGH (ref 5–15)
BUN: <5 mg/dL — ABNORMAL LOW (ref 6–20)
CO2: 22 mmol/L (ref 22–32)
Calcium: 8.2 mg/dL — ABNORMAL LOW (ref 8.9–10.3)
Chloride: 97 mmol/L — ABNORMAL LOW (ref 98–111)
Creatinine, Ser: 0.52 mg/dL — ABNORMAL LOW (ref 0.61–1.24)
GFR, Estimated: >60 mL/min (ref >60)
Glucose, Bld: 97 mg/dL (ref 70–99)
Potassium: 2.8 mmol/L — ABNORMAL LOW (ref 3.5–5.1)
Sodium: 136 mmol/L (ref 135–145)
Total Bilirubin: 1.4 mg/dL — ABNORMAL HIGH (ref 0.3–1.2)
Total Protein: 6.5 g/dL (ref 6.5–8.1)

## 2020-11-08 LAB — PROTIME-INR
INR: 1.3 — ABNORMAL HIGH (ref 0.8–1.2)
Prothrombin Time: 15.5 seconds — ABNORMAL HIGH (ref 11.4–15.2)

## 2020-11-08 LAB — BRAIN NATRIURETIC PEPTIDE: B Natriuretic Peptide: 42.3 pg/mL (ref 0.0–100.0)

## 2020-11-08 LAB — RESPIRATORY PANEL BY RT PCR (FLU A&B, COVID)
Influenza A by PCR: NEGATIVE
Influenza B by PCR: NEGATIVE
SARS Coronavirus 2 by RT PCR: NEGATIVE

## 2020-11-08 LAB — ETHANOL: Alcohol, Ethyl (B): <10 mg/dL (ref <10)

## 2020-11-08 LAB — GLUCOSE, CAPILLARY: Glucose-Capillary: 69 mg/dL — ABNORMAL LOW (ref 70–99)

## 2020-11-08 LAB — LIPASE, BLOOD: Lipase: 32 U/L (ref 11–51)

## 2020-11-08 LAB — MAGNESIUM: Magnesium: 1.7 mg/dL (ref 1.7–2.4)

## 2020-11-08 MED ORDER — ENOXAPARIN SODIUM 40 MG/0.4ML ~~LOC~~ SOLN
40.0000 mg | SUBCUTANEOUS | Status: DC
  Administered 2020-11-08 – 2020-11-09 (×2): 40 mg via SUBCUTANEOUS
  Filled 2020-11-08 (×2): qty 0.4

## 2020-11-08 MED ORDER — POTASSIUM CHLORIDE 20 MEQ PO PACK
40.0000 meq | PACK | ORAL | Status: AC
  Administered 2020-11-08: 40 meq via ORAL
  Filled 2020-11-08: qty 2

## 2020-11-08 MED ORDER — POTASSIUM CHLORIDE CRYS ER 20 MEQ PO TBCR
40.0000 meq | EXTENDED_RELEASE_TABLET | Freq: Once | ORAL | Status: AC
  Administered 2020-11-08: 40 meq via ORAL
  Filled 2020-11-08: qty 2

## 2020-11-08 MED ORDER — HYDROXYZINE HCL 10 MG PO TABS
10.0000 mg | ORAL_TABLET | Freq: Three times a day (TID) | ORAL | Status: DC | PRN
  Filled 2020-11-08: qty 1

## 2020-11-08 MED ORDER — PANTOPRAZOLE SODIUM 40 MG IV SOLR
40.0000 mg | Freq: Every morning | INTRAVENOUS | Status: DC
  Administered 2020-11-09: 40 mg via INTRAVENOUS
  Filled 2020-11-08: qty 40

## 2020-11-08 MED ORDER — LORAZEPAM 0.5 MG PO TABS: 0.5000 mg | ORAL_TABLET | Freq: Three times a day (TID) | ORAL | Status: DC | PRN

## 2020-11-08 MED ORDER — POTASSIUM CHLORIDE 10 MEQ/100ML IV SOLN
10.0000 meq | INTRAVENOUS | Status: AC
  Administered 2020-11-08 (×2): 10 meq via INTRAVENOUS
  Filled 2020-11-08: qty 100

## 2020-11-08 MED ORDER — GADOBUTROL 1 MMOL/ML IV SOLN
9.0000 mL | Freq: Once | INTRAVENOUS | Status: AC | PRN
  Administered 2020-11-08: 9 mL via INTRAVENOUS

## 2020-11-08 MED ORDER — IOHEXOL 9 MG/ML PO SOLN
500.0000 mL | ORAL | Status: AC
  Administered 2020-11-08: 500 mL via ORAL

## 2020-11-08 MED ORDER — SODIUM CHLORIDE 0.9 % IV SOLN: INTRAVENOUS | Status: DC

## 2020-11-08 MED ORDER — OXYCODONE HCL 5 MG PO TABS: 5.0000 mg | ORAL_TABLET | Freq: Four times a day (QID) | ORAL | Status: DC | PRN

## 2020-11-08 MED ORDER — IOHEXOL 300 MG/ML  SOLN
100.0000 mL | Freq: Once | INTRAMUSCULAR | Status: AC | PRN
  Administered 2020-11-08: 100 mL via INTRAVENOUS

## 2020-11-08 MED ORDER — MECLIZINE HCL 25 MG PO TABS
25.0000 mg | ORAL_TABLET | Freq: Three times a day (TID) | ORAL | Status: DC | PRN
  Filled 2020-11-08: qty 1

## 2020-11-08 MED ORDER — ACETAMINOPHEN 325 MG PO TABS: 650.0000 mg | ORAL_TABLET | Freq: Four times a day (QID) | ORAL | Status: DC | PRN

## 2020-11-08 MED ORDER — MAGNESIUM SULFATE 2 GM/50ML IV SOLN
2.0000 g | Freq: Once | INTRAVENOUS | Status: AC
  Administered 2020-11-08: 2 g via INTRAVENOUS
  Filled 2020-11-08: qty 50

## 2020-11-08 MED ORDER — MECLIZINE HCL 25 MG PO TABS
25.0000 mg | ORAL_TABLET | Freq: Once | ORAL | Status: AC
  Administered 2020-11-08: 25 mg via ORAL
  Filled 2020-11-08: qty 1

## 2020-11-08 MED ORDER — PROMETHAZINE HCL 25 MG/ML IJ SOLN
12.5000 mg | Freq: Four times a day (QID) | INTRAMUSCULAR | Status: DC | PRN
  Administered 2020-11-08: 12.5 mg via INTRAVENOUS
  Filled 2020-11-08: qty 1

## 2020-11-08 MED ORDER — VITAMIN B-12 1000 MCG PO TABS
1000.0000 ug | ORAL_TABLET | Freq: Every day | ORAL | Status: DC
  Administered 2020-11-08 – 2020-11-10 (×3): 1000 ug via ORAL
  Filled 2020-11-08 (×3): qty 1

## 2020-11-08 MED ORDER — SODIUM CHLORIDE 0.9 % IV BOLUS
1000.0000 mL | Freq: Once | INTRAVENOUS | Status: AC
  Administered 2020-11-08: 1000 mL via INTRAVENOUS

## 2020-11-08 MED ORDER — HYDROXYZINE HCL 50 MG/ML IM SOLN
25.0000 mg | Freq: Four times a day (QID) | INTRAMUSCULAR | Status: DC | PRN
  Filled 2020-11-08: qty 0.5

## 2020-11-08 MED ORDER — IVABRADINE HCL 5 MG PO TABS
5.0000 mg | ORAL_TABLET | Freq: Two times a day (BID) | ORAL | Status: DC
  Filled 2020-11-08: qty 1

## 2020-11-08 NOTE — H&P (Addendum)
History and Physical    Tyler Gallagher YQI:[id number] DOB: 1961/05/17 DOA: 11/08/2020  Referring MD/NP/PA:   PCP: Juluis Pitch, MD   Patient coming from:  The patient is coming from home.  At baseline, pt is independent for most of ADL.        Chief Complaint: Dizziness  HPI: Tyler Gallagher is a 59 y.o. male with medical history significant of hypertension, hyperlipidemia, diabetes mellitus, TIA, anxiety, lung cancer with brain metastases, left AKA, chemotherapy-induced neutropenia, former smoker, who presents with dizziness.  Pt states that he started feeling dizzy this morning, also has feeling of room spinning. She has nausea, vomiting and diarrhea.  Patient states that he vomited 3 times with nonbilious nonbloody vomiting. He has 2 loose stool bowel movement.  No abdominal pain.  He has generalized weakness, but no unilateral numbness or tingling in his extremities.  No vision loss or hearing loss.  No facial droop.  Patient has mild dry cough and mild shortness of breath chronically, which has not changed.  No chest pain, fever or chills.  No symptoms of UTI. Nothing in particular makes the symptoms better or worse.   Initially patient had hypotension with blood pressure 89/79, which improved to 130/85 after giving 1 L normal saline in ED.  ED Course: pt was found to have WBC 2.4, INR 1.3, negative Covid PCR, alcohol level less than 10, potassium 2.8, renal function okay, magnesium 1.3, temperature 97.5, tachycardia with heart rate of 128, 99, RR 23, 18, oxygen saturation 92-97% on room air.  Patient is placed on MedSurg bed for observation, [name] of oncology is consulted by EDP  MRI of the brain: 1. No acute finding. 2. Diminished/resolved enhancement at the left parafalcine frontal lobe resection site. 3. 5 mm left parietal nodule which has slightly increased from a 08/11/2020 scan, consistent with metastatic disease. 4. Unchanged 2 mm of focus of enhancement in the medial  right temporal lobe, also most consistent with metastasis. 5. On prior there was clustered punctate enhancing foci in the right frontal lobe which have resolved, favor subacute infarcts. Today there is a similar cluster of tiny nodular enhancement along the left frontal parietal cortex, which may also be subacute infarcts.   Review of Systems:   General: no fevers, chills, no body weight gain, has poor appetite, has fatigue HEENT: no blurry vision, hearing changes or sore throat Respiratory: has dyspnea, coughing, no wheezing CV: no chest pain, no palpitations GI: has nausea, vomiting, diarrhea, no constipation, abdominal pain GU: no dysuria, burning on urination, increased urinary frequency, hematuria  Ext: no leg edema. S/p of left AKA Neuro: no unilateral weakness, numbness, or tingling, no vision change or hearing loss. Has dizziness Skin: no rash, no skin tear. MSK: No muscle spasm, no deformity, no limitation of range of movement in spin Heme: No easy bruising.  Travel history: No recent long distant travel.  Allergy:  Allergies  Allergen Reactions  . Penicillins Other (See Comments)    unknown    Past Medical History:  Diagnosis Date  . Diabetes mellitus without complication ([place])   . Hyperlipidemia   . Hypertension   . Lesion of brain   . Lung cancer ([place])   . Mass of lung     Past Surgical History:  Procedure Laterality Date  . 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 COLONOSCOPY    . CYSTOSCOPY W/ RETROGRADES Bilateral 07/14/2020   Procedure: CYSTOSCOPY WITH RETROGRADE PYELOGRAM;  Surgeon: Hollice Espy, MD;  Location: [facility];  Service: Urology;  Laterality: Bilateral;  . PORTA CATH INSERTION N/A 08/03/2020   Procedure: PORTA CATH INSERTION;  Surgeon: Katha Cabal, MD;  Location: [facility];  Service: Cardiovascular;  Laterality: N/A;  . PORTA CATH INSERTION N/A 10/03/2020   Procedure: PORTA CATH INSERTION;  Surgeon: Algernon Huxley, MD;  Location: [facility];  Service: Cardiovascular;  Laterality: N/A;  . VIDEO BRONCHOSCOPY WITH ENDOBRONCHIAL NAVIGATION N/A 07/01/2020   Procedure: VIDEO BRONCHOSCOPY WITH ENDOBRONCHIAL NAVIGATION;  Surgeon: [name], MD;  Location: [facility];  Service: Thoracic;  Laterality: N/A;  . VIDEO BRONCHOSCOPY WITH ENDOBRONCHIAL ULTRASOUND N/A 07/01/2020   Procedure: VIDEO BRONCHOSCOPY WITH ENDOBRONCHIAL ULTRASOUND;  Surgeon: [name], MD;  Location: [facility];  Service: Thoracic;  Laterality: N/A;    Social History:  reports that he quit smoking about 4 months ago. His smoking use included cigarettes. He has a 64.00 pack-year smoking history. He has never used smokeless tobacco. He reports previous drug use. Drug: Marijuana. He reports that he does not drink alcohol.  Family History:  Family History  Problem Relation Age of Onset  . Diabetes Sister   . Diabetes Brother      Prior to Admission medications   Medication Sig Start Date End Date Taking? Authorizing Provider  [facility] SENNA PLUS 8.6-50 MG tablet Take 2 tablets by mouth at bedtime as needed for mild constipation.  07/20/20   [provider]  ivabradine (CORLANOR) 5 MG TABS tablet Take 1 tablet (5 mg total) by mouth 2 (two) times daily with a meal. 10/31/20   Kate Sable, MD  lidocaine-prilocaine (EMLA) cream Apply to affected area once 07/26/20   [name], MD  loperamide (IMODIUM) 2 MG capsule Take 2 mg by mouth as needed for diarrhea or loose stools.     [provider]  LORazepam (ATIVAN) 0.5 MG tablet Take 1 tablet (0.5 mg total) by mouth every 8 (eight) hours as needed for anxiety. 10/17/20   [name], MD  OLANZapine (ZYPREXA) 10 MG tablet Take 1 tablet (10 mg total) by mouth at bedtime. 10/13/20   [name], MD  ondansetron (ZOFRAN) 4 MG tablet Take 4 mg by mouth every 8 (eight) hours as needed.  07/20/20   [provider]  ondansetron (ZOFRAN) 8 MG tablet Take 1 tablet (8 mg total) by mouth  2 (two) times daily as needed for refractory nausea / vomiting. Start on day 3 after chemo. 07/26/20   [name], MD  oxyCODONE (OXY IR/ROXICODONE) 5 MG immediate release tablet Take 1 tablet (5 mg total) by mouth every 6 (six) hours as needed for severe pain. 07/26/20   [name], MD  potassium chloride SA (KLOR-CON) 20 MEQ tablet Take 1 tablet (20 mEq total) by mouth 2 (two) times daily. 10/27/20   [name], MD  prochlorperazine (COMPAZINE) 10 MG tablet Take 1 tablet (10 mg total) by mouth every 6 (six) hours as needed (Nausea or vomiting). 07/26/20   [name], MD  triamcinolone ointment (KENALOG) 0.5 % Apply 1 application topically 2 (two) times daily. 10/06/20   Jacquelin Hawking, NP  vitamin B-12 (CYANOCOBALAMIN) 1000 MCG tablet Take 1,000 mcg by mouth daily.    [provider]    Physical Exam: Vitals:   11/08/20 0600 11/08/20 0615 11/08/20 0730 11/08/20 0800  BP: (!) 130/95  (!) 157/106 131/88  Pulse: 99 (!) 103 (!) 104 99  Resp: Marland Kitchen)  21 (!) 23 18 18   Temp:      TempSrc:      SpO2: 94% 95% 97% 97%  Weight:      Height:       General: Not in acute distress.  Dry mucous membrane  HEENT:       Eyes: PERRL, EOMI, no scleral icterus.       ENT: No discharge from the ears and nose, no pharynx injection, no tonsillar enlargement.        Neck: No JVD, no bruit, no mass felt. Heme: No neck lymph node enlargement. Cardiac: S1/S2, RRR, No murmurs, No gallops or rubs. Respiratory:  No rales, wheezing, rhonchi or rubs. GI: Soft, nondistended, nontender, no rebound pain, no organomegaly, BS present. GU: No hematuria Ext: No pitting leg edema bilaterally. 1+DP/PT pulse in right leg and s/p of left AKA. Musculoskeletal: No joint deformities, No joint redness or warmth, no limitation of ROM in spin. Skin: No rashes.  Neuro: Alert, oriented X3, cranial nerves II-XII grossly intact, moves all extremities normally. Muscle strength 5/5 in all extremities, sensation to  light touch intact.  Psych: Patient is not psychotic, no suicidal or hemocidal ideation.  Labs on Admission: I have personally reviewed following labs and imaging studies  CBC: Recent Labs  Lab 11/08/20 0432  WBC 2.4*  NEUTROABS 1.4*  HGB 11.7*  HCT 35.4*  MCV 93.4  PLT 016   Basic Metabolic Panel: Recent Labs  Lab 11/07/20 1341 11/08/20 0432  NA 133* 136  K 3.0* 2.8*  CL 94* 97*  CO2 26 22  GLUCOSE 93 97  BUN <5* <5*  CREATININE 0.72 0.52*  CALCIUM 9.0 8.2*  MG 1.3* 1.7   GFR: Estimated Creatinine Clearance: 120.3 mL/min (A) (by C-G formula based on SCr of 0.52 mg/dL (L)). Liver Function Tests: Recent Labs  Lab 11/07/20 1341 11/08/20 0432  AST 12* 12*  ALT 10 10  ALKPHOS 72 67  BILITOT 1.3* 1.4*  PROT 7.1 6.5  ALBUMIN 3.4* 3.1*   Recent Labs  Lab 11/08/20 0432  LIPASE 32   No results for input(s): AMMONIA in the last 168 hours. Coagulation Profile: Recent Labs  Lab 11/08/20 0432  INR 1.3*   Cardiac Enzymes: No results for input(s): CKTOTAL, CKMB, CKMBINDEX, TROPONINI in the last 168 hours. BNP (last 3 results) No results for input(s): PROBNP in the last 8760 hours. HbA1C: No results for input(s): HGBA1C in the last 72 hours. CBG: No results for input(s): GLUCAP in the last 168 hours. Lipid Profile: No results for input(s): CHOL, HDL, LDLCALC, TRIG, CHOLHDL, LDLDIRECT in the last 72 hours. Thyroid Function Tests: No results for input(s): TSH, T4TOTAL, FREET4, T3FREE, THYROIDAB in the last 72 hours. Anemia Panel: No results for input(s): VITAMINB12, FOLATE, FERRITIN, TIBC, IRON, RETICCTPCT in the last 72 hours. Urine analysis:    Component Value Date/Time   COLORURINE STRAW (A) 06/15/2020 1354   APPEARANCEUR Clear 07/13/2020 0846   LABSPEC 1.003 (L) 06/15/2020 1354   PHURINE 6.0 06/15/2020 1354   GLUCOSEU 2+ (A) 07/13/2020 0846   HGBUR NEGATIVE 06/15/2020 1354   BILIRUBINUR Negative 07/13/2020 0846   KETONESUR NEGATIVE 06/15/2020 1354    PROTEINUR Negative 07/13/2020 0846   PROTEINUR NEGATIVE 06/15/2020 1354   NITRITE Negative 07/13/2020 0846   NITRITE NEGATIVE 06/15/2020 1354   LEUKOCYTESUR Negative 07/13/2020 0846   LEUKOCYTESUR NEGATIVE 06/15/2020 1354   Sepsis Labs: @LABRCNTIP (procalcitonin:4,lacticidven:4) ) Recent Results (from the past 240 hour(s))  Respiratory Panel by RT PCR (Flu A&B,  Covid) - Nasopharyngeal Swab     Status: None   Collection Time: 11/08/20  6:28 AM   Specimen: Nasopharyngeal Swab  Result Value Ref Range Status   SARS Coronavirus 2 by RT PCR NEGATIVE NEGATIVE Final    Comment: (NOTE) SARS-CoV-2 target nucleic acids are NOT DETECTED.  The SARS-CoV-2 RNA is generally detectable in upper respiratoy specimens during the acute phase of infection. The lowest concentration of SARS-CoV-2 viral copies this assay can detect is 131 copies/mL. A negative result does not preclude SARS-Cov-2 infection and should not be used as the sole basis for treatment or other patient management decisions. A negative result may occur with  improper specimen collection/handling, submission of specimen other than nasopharyngeal swab, presence of viral mutation(s) within the areas targeted by this assay, and inadequate number of viral copies (<131 copies/mL). A negative result must be combined with clinical observations, patient history, and epidemiological information. The expected result is Negative.  Fact Sheet for Patients:  PinkCheek.be  Fact Sheet for Healthcare Providers:  GravelBags.it  This test is no t yet approved or cleared by the [place] FDA and  has been authorized for detection and/or diagnosis of SARS-CoV-2 by FDA under an Emergency Use Authorization (EUA). This EUA will remain  in effect (meaning this test can be used) for the duration of the COVID-19 declaration under Section 564(b)(1) of the Act, 21 U.S.C. section 360bbb-3(b)(1),  unless the authorization is terminated or revoked sooner.     Influenza A by PCR NEGATIVE NEGATIVE Final   Influenza B by PCR NEGATIVE NEGATIVE Final    Comment: (NOTE) The Xpert Xpress SARS-CoV-2/FLU/RSV assay is intended as an aid in  the diagnosis of influenza from Nasopharyngeal swab specimens and  should not be used as a sole basis for treatment. Nasal washings and  aspirates are unacceptable for Xpert Xpress SARS-CoV-2/FLU/RSV  testing.  Fact Sheet for Patients: PinkCheek.be  Fact Sheet for Healthcare Providers: GravelBags.it  This test is not yet approved or cleared by the [place] FDA and  has been authorized for detection and/or diagnosis of SARS-CoV-2 by  FDA under an Emergency Use Authorization (EUA). This EUA will remain  in effect (meaning this test can be used) for the duration of the  Covid-19 declaration under Section 564(b)(1) of the Act, 21  U.S.C. section 360bbb-3(b)(1), unless the authorization is  terminated or revoked. Performed at [facility], [address], [place], [place] [address]      Radiological Exams on Admission: MR Brain W and Wo Contrast  Result Date: 11/08/2020 CLINICAL DATA:  Vertigo. History of lung cancer and brain metastases EXAM: MRI HEAD WITHOUT AND WITH CONTRAST TECHNIQUE: Multiplanar, multiecho pulse sequences of the brain and surrounding structures were obtained without and with intravenous contrast. CONTRAST:  63mL GADAVIST GADOBUTROL 1 MMOL/ML IV SOLN COMPARISON:  08/11/2020 FINDINGS: Brain: Recent left parafalcine resection at the vertex with significantly diminished, essentially resolved, enhancement. Punctate foci of enhancement clustered in the superior right frontal lobe are resolved since prior but there are is a new area of similar size punctate enhancement clustered left parietal cortex which are marked on 27:035 and adjacent slices. In retrospect there was  a nodule of enhancement along the parasagittal left parietal cortex which has increased in size and now measures 5 mm, consistent with metastasis. Persistent and unchanged 2 mm focus of enhancement in the anterior and medial right temporal lobe on 18:66. No acute infarct. Foci of restricted diffusion in the left cerebellum on prior have  resolved. No hemorrhage, hydrocephalus, or masslike finding. 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 Vascular: Normal flow voids and vascular enhancements Skull and upper cervical spine: Unremarkable left-sided craniotomy. Sinuses/Orbits: Retention cyst in the right maxillary sinus and partial left mastoid opacification with negative nasopharynx. IMPRESSION: 1. No acute finding. 2. Diminished/resolved enhancement at the left parafalcine frontal lobe resection site. 3. 5 mm left parietal nodule which has slightly increased from a 08/11/2020 scan, consistent with metastatic disease. 4. Unchanged 2 mm of focus of enhancement in the medial right temporal lobe, also most consistent with metastasis. 5. On prior there was clustered punctate enhancing foci in the right frontal lobe which have resolved, favor subacute infarcts. Today there is a similar cluster of tiny nodular enhancement along the left frontal parietal cortex, which may also be subacute infarcts. Electronically Signed   By: Monte Fantasia M.D.   On: 11/08/2020 07:52     EKG: I have personally reviewed.  Sinus rhythm, QTC 518, low voltage, anteroseptal infarction pattern   Assessment/Plan Principal Problem:   Vertigo Active Problems:   TIA (transient ischemic attack)   Hyperlipidemia   Type 2 diabetes mellitus without complication, without long-term current use of insulin (HCC)   Brain metastasis ([facility])   Malignant neoplasm of lower lobe of right lung ([facility]CC)   Chronic systolic CHF (congestive heart failure) (HCC)   Hypomagnesemia   Hypotension   Hypokalemia   Nausea vomiting and  diarrhea   Anxiety   Vertigo: Differential diagnosis include orthostatic status, hypotension, dehydration, peripheral vertigo.  MRI for brain did not show acute new issues. -Placed on MedSurg bed of observation -Check orthostatic vital sign -prn Meclizine -IV fluid: 1 L normal saline, followed by 75 cc/h -PT/OT  Nausea, vomiting, diarrhea: Etiology is not clear -IV fluid as above -As needed hydroxyzine (patient has QTc 518, cannot use Zofran) -Check C. difficile PCR   Hypokalemia and hypomagnesemia: K 2.8 and Mg 1.3 -repleted both  Type 2 diabetes mellitus without complication, without long-term current use of insulin: Recent A1c 7.7, poorly controlled.  Patient is not taking medications at home.  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  Patient is following up with [name] who is consulted by EDP. He recommended CT of chest/abdomen/pelvis for staging purposes. -CT-chest/abd/pelvis per [name]  Chronic systolic CHF (congestive heart failure) [facility]): 2D echo on 10/26/2020 showed EF of 30-35%.  Patient is clinically dry.  No signs of CHF exacerbation. -Watch volume status closely while patient is on IV fluid -Check BNP -continue Irabradine  Hypotension: Likely due to dehydration.  No source of infection identified.  Initial blood pressure 89/79, which improved to 130/85 after giving 1 L normal saline in ED. -Will continue IV fluid as above  Anxiety: -continue home Ativan    DVT ppx: SQ Lovenox Code Status: Full code per pt and his son Family Communication:      Yes, patient's son at bed side Disposition Plan:  Anticipate discharge back to previous  environment Consults called:  [name] of oncology Admission status: Med-surg bed for obs  Status is: Observation  The patient remains OBS appropriate and will d/c before 2 midnights.  Dispo: The patient is from: Home              Anticipated d/c is to: Home              Anticipated d/c date is: 1 day              Patient currently is not medically stable to d/c.          Date of Service 11/08/2020    Ivor Costa [facility]   If 7PM-7AM, please contact night-coverage [email or web address] 11/08/2020, 9:37 AM

## 2020-11-08 NOTE — ED Notes (Signed)
Pt brought back from MRI at this time.

## 2020-11-08 NOTE — Progress Notes (Signed)
PT Cancellation Note  Patient Details Name: Tyler Gallagher MRN: [id number] DOB: 03-09-1961   Cancelled Treatment:    Reason Eval/Treat Not Completed: Patient declined, no reason specified  Chart reviewed, spoke with nurse.  Entered room and pt refused to work with PT, pleasantly but unequivocally.  He states he did work with OT but became very dizzy with minimal activity.  Pt states "Maybe tomorrow will be a different story, but I can't today."  Kreg Shropshire, DPT 11/08/2020, 5:23 PM

## 2020-11-08 NOTE — ED Provider Notes (Signed)
[facility] Emergency Department Provider Note  ____________________________________________   First MD Initiated Contact with Patient 11/08/20 [phone]     (approximate)  I have reviewed the triage vital signs and the nursing notes.   HISTORY  Chief Complaint Dizziness    HPI Tyler Gallagher is a 59 y.o. 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  [name] is his oncologist.  He said that he is "pretty much done with treatments".  His last chemotherapy treatment was about a week ago.    He presents by EMS tonight  for evaluation of acute onset vertigo.  He said that he woke up and felt like the room was spinning very severely.  He also experienced extreme nausea.  The symptoms have improved but not completely resolved.  He has no new headache.  He has no weakness in his arms or his legs that he is aware of.  Because of the vertigo he does not feel like he can walk or test his balance.  Nothing in particular makes the symptoms better or worse.  No sensitivity to light.  No fever, sore throat, chest pain, nor shortness of breath.        Past Medical History:  Diagnosis Date  . Diabetes mellitus without complication ([place])   . Hyperlipidemia   . Hypertension   . Lesion of brain   . Lung cancer ([place])   . Mass of lung     Patient Active Problem List   Diagnosis Date Noted  . Chemotherapy induced neutropenia ([place]) 09/29/2020  . Goals of care, counseling/discussion 07/08/2020  . Malignant neoplasm of lower lobe of right lung ([place]) 07/08/2020  . Brain metastasis ([place]) 06/22/2020  . Vasogenic brain edema ([place]) 06/21/2020  . Hyperlipidemia 01/02/2016  . Hypertriglyceridemia 01/02/2016  . Tobacco abuse 01/02/2016  . Type 2 diabetes mellitus without complication, without long-term current use of insulin ([place]) 01/02/2016  . Obesity  01/02/2016  . S/P AKA (above knee amputation) unilateral ([place]) 01/02/2016  . Amaurosis fugax 01/01/2016  . TIA (transient ischemic attack) 01/01/2016    Past Surgical History:  Procedure Laterality Date  . 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 COLONOSCOPY    . CYSTOSCOPY W/ RETROGRADES Bilateral 07/14/2020   Procedure: CYSTOSCOPY WITH RETROGRADE PYELOGRAM;  Surgeon: Hollice Espy, MD;  Location: [facility] ORS;  Service: Urology;  Laterality: Bilateral;  . PORTA CATH INSERTION N/A 08/03/2020   Procedure: PORTA CATH INSERTION;  Surgeon: Katha Cabal, MD;  Location: [facility];  Service: Cardiovascular;  Laterality: N/A;  . PORTA CATH INSERTION N/A 10/03/2020   Procedure: PORTA CATH INSERTION;  Surgeon: Algernon Huxley, MD;  Location: [facility];  Service: Cardiovascular;  Laterality: N/A;  . VIDEO BRONCHOSCOPY WITH ENDOBRONCHIAL NAVIGATION N/A 07/01/2020   Procedure: VIDEO BRONCHOSCOPY WITH ENDOBRONCHIAL NAVIGATION;  Surgeon: [name], MD;  Location: [facility] ORS;  Service: Thoracic;  Laterality: N/A;  . VIDEO BRONCHOSCOPY WITH ENDOBRONCHIAL ULTRASOUND N/A 07/01/2020   Procedure: VIDEO BRONCHOSCOPY WITH ENDOBRONCHIAL ULTRASOUND;  Surgeon: [name], MD;  Location: [facility];  Service: Thoracic;  Laterality: N/A;    Prior to Admission medications   Medication Sig Start Date End Date Taking? Authorizing Provider  [employer] SENNA PLUS 8.6-50 MG tablet Take 2 tablets by mouth at bedtime as needed for mild constipation.  07/20/20   [provider]  ivabradine (CORLANOR) 5 MG  TABS tablet Take 1 tablet (5 mg total) by mouth 2 (two) times daily with a meal. 10/31/20   Kate Sable, MD  lidocaine-prilocaine (EMLA) cream Apply to affected area once 07/26/20   [name], MD  loperamide (IMODIUM) 2 MG capsule Take 2 mg by mouth as needed for diarrhea or loose stools.     [provider]  LORazepam (ATIVAN) 0.5 MG tablet Take 1 tablet (0.5 mg total) by mouth every 8  (eight) hours as needed for anxiety. 10/17/20   [name], MD  OLANZapine (ZYPREXA) 10 MG tablet Take 1 tablet (10 mg total) by mouth at bedtime. 10/13/20   [name], MD  ondansetron (ZOFRAN) 4 MG tablet Take 4 mg by mouth every 8 (eight) hours as needed.  07/20/20   [provider]  ondansetron (ZOFRAN) 8 MG tablet Take 1 tablet (8 mg total) by mouth 2 (two) times daily as needed for refractory nausea / vomiting. Start on day 3 after chemo. 07/26/20   [name], MD  oxyCODONE (OXY IR/ROXICODONE) 5 MG immediate release tablet Take 1 tablet (5 mg total) by mouth every 6 (six) hours as needed for severe pain. 07/26/20   [name], MD  potassium chloride SA (KLOR-CON) 20 MEQ tablet Take 1 tablet (20 mEq total) by mouth 2 (two) times daily. 10/27/20   [name], MD  prochlorperazine (COMPAZINE) 10 MG tablet Take 1 tablet (10 mg total) by mouth every 6 (six) hours as needed (Nausea or vomiting). 07/26/20   [name], MD  triamcinolone ointment (KENALOG) 0.5 % Apply 1 application topically 2 (two) times daily. 10/06/20   Jacquelin Hawking, NP  vitamin B-12 (CYANOCOBALAMIN) 1000 MCG tablet Take 1,000 mcg by mouth daily.    [provider]    Allergies Penicillins  Family History  Problem Relation Age of Onset  . Diabetes Sister   . Diabetes Brother     Social History Social History   Tobacco Use  . Smoking status: Former Smoker    Packs/day: 2.00    Years: 32.00    Pack years: 64.00    Types: Cigarettes    Quit date: 06/21/2020    Years since quitting: 0.3  . Smokeless tobacco: Never Used  Vaping Use  . Vaping Use: Never used  Substance Use Topics  . Alcohol use: No    Alcohol/week: 0.0 standard drinks  . Drug use: Not Currently    Types: Marijuana    Review of Systems Constitutional: No fever/chills Eyes: No visual changes. ENT: No sore throat. Cardiovascular: Denies chest pain. Respiratory: Denies shortness of  breath. Gastrointestinal: No abdominal pain.  No nausea, no vomiting.  No diarrhea.  No constipation. Genitourinary: Negative for dysuria. Musculoskeletal: Negative for neck pain.  Negative for back pain. Integumentary: Negative for rash. Neurological: Vertigo.  Negative for headaches, focal weakness or numbness.   ____________________________________________   PHYSICAL EXAM:  VITAL SIGNS: ED Triage Vitals  Enc Vitals Group     BP 11/08/20 0424 101/89     Pulse Rate 11/08/20 0424 (!) 104     Resp 11/08/20 0424 16     Temp 11/08/20 0429 (!) 97.5 F (36.4 C)     Temp Source 11/08/20 0424 Oral     SpO2 11/08/20 0424 94 %     Weight 11/08/20 0425 95.3 kg (210 lb)     Height 11/08/20 0425 1.905 m (_0 )     Head Circumference --  Peak Flow --      Pain Score 11/08/20 0425 0     Pain Loc --      Pain Edu? --      Excl. in [place]? --     Constitutional: Alert and oriented.   Eyes: Conjunctivae are normal.  Pupils are equal and reactive.  No nystagmus.  Extraocular motion is intact.   Head: Atraumatic. Nose: No congestion/rhinnorhea. Mouth/Throat: Patient is wearing a mask. Neck: No stridor.  No meningeal signs.   Cardiovascular: Normal rate, regular rhythm. Good peripheral circulation. Grossly normal heart sounds. Respiratory: Normal respiratory effort.  No retractions. Gastrointestinal: Soft and nontender. No distention.  Musculoskeletal: No lower extremity tenderness nor edema. No gross deformities of extremities. Neurologic:  Normal speech and language. No gross focal neurologic deficits are appreciated.  No obvious abnormalities but given his persistent though improved vertigo I did not have him stand or attempt ambulation. Skin:  Skin is warm, dry and intact. Psychiatric: Mood and affect are normal. Speech and behavior are normal.  ____________________________________________   LABS (all labs ordered are listed, but only abnormal results are displayed)  Labs  Reviewed  CBC WITH DIFFERENTIAL/PLATELET - Abnormal; Notable for the following components:      Result Value   WBC 2.4 (*)    RBC 3.79 (*)    Hemoglobin 11.7 (*)    HCT 35.4 (*)    RDW 18.2 (*)    Neutro Abs 1.4 (*)    All other components within normal limits  COMPREHENSIVE METABOLIC PANEL - Abnormal; Notable for the following components:   Potassium 2.8 (*)    Chloride 97 (*)    BUN <5 (*)    Creatinine, Ser 0.52 (*)    Calcium 8.2 (*)    Albumin 3.1 (*)    AST 12 (*)    Total Bilirubin 1.4 (*)    Anion gap 17 (*)    All other components within normal limits  PROTIME-INR - Abnormal; Notable for the following components:   Prothrombin Time 15.5 (*)    INR 1.3 (*)    All other components within normal limits  RESPIRATORY PANEL BY RT PCR (FLU A&B, COVID)  ETHANOL  LIPASE, BLOOD  MAGNESIUM   ____________________________________________  EKG  ED ECG REPORT I, Hinda Kehr, the attending physician, personally viewed and interpreted this ECG.  Date: 11/08/2020 EKG Time: 4:29 AM Rate: 106 Rhythm: sinus tachycardia QRS Axis: normal Intervals: Minimally prolonged QTC at 518 ms ST/T Wave abnormalities: Non-specific ST segment / T-wave changes, but no clear evidence of acute ischemia. Narrative Interpretation: no definitive evidence of acute ischemia; does not meet STEMI criteria.   ____________________________________________  RADIOLOGY I, Hinda Kehr, personally viewed and evaluated these images (plain radiographs) as part of my medical decision making, as well as reviewing the written report by the radiologist.  ED MD interpretation: MRI of the brain with and without contrast is pending at the time of transfer of care.  Official radiology report(s): No results found.  ____________________________________________   PROCEDURES   Procedure(s) performed (including Critical Care):  .1-3 Lead EKG Interpretation Performed by: Hinda Kehr, MD Authorized by:  Hinda Kehr, MD     Interpretation: abnormal     ECG rate:  105   ECG rate assessment: tachycardic     Rhythm: sinus tachycardia     Ectopy: none     Conduction: normal       ____________________________________________   INITIAL IMPRESSION / MDM / ASSESSMENT AND PLAN /  ED COURSE  As part of my medical decision making, I reviewed the following data within the [place] notes reviewed and incorporated, Labs reviewed , EKG interpreted , Old chart reviewed, Patient signed out to Dr. Quentin Cornwall and Notes from prior ED visits   Differential diagnosis includes, but is not limited to, worsening vasogenic edema or brain metastasis, acute intracranial bleeding, new brain metastases, CVA, cerebral vascular insufficiency, acute infection, peripheral vertigo.  The patient is on the cardiac monitor to evaluate for evidence of arrhythmia and/or significant heart rate changes.  Mild prolongation of QTc interval but otherwise normal EKG.  Vital signs are generally acceptable although he has some mild tachypnea and was mildly hypothermic but I do not believe that this represents acute infection.  He has had no recent infectious symptoms.  His comprehensive metabolic panel is notable for hypokalemia which is chronic for him, decreased creatinine, and minimal elevation of his anion gap which is suspect is volume related and reflective of general electrolyte abnormalities.  CBC is notable for a white blood cell count of 2.4, likely reflective of his recent chemotherapy.  Platelets are normal.  Differential is still pending so I cannot calculate ANC.  I have ordered 1 L normal saline for probable volume depletion and his mild tachycardia.  I ordered Antivert 25 mg by mouth.  Also potassium 40 mEq by mouth and magnesium 2 g IV to help address his electrolyte abnormalities.  Given his high risk of intracranial issues and an MRI from a few months ago showing some vasogenic edema and a  brain met, I ordered an MRI brain with and without contrast for further evaluation to determine if his acute onset and severe vertigo could be the result of his known metastasis, new metastases, worsening vasogenic edema, or other acute or emergent condition.  The patient understands and agrees with the plan.  Transferring ED care at 7 AM to Dr. Quentin Cornwall to follow-up the results and disposition appropriately.     Clinical Course as of Nov 09 807  Tue Nov 08, 2020  0746 Transferring ED care to Dr. Quentin Cornwall   [CF]    Clinical Course User Index [CF] Hinda Kehr, MD     ____________________________________________  FINAL CLINICAL IMPRESSION(S) / ED DIAGNOSES  Final diagnoses:  Vertigo     MEDICATIONS GIVEN DURING THIS VISIT:  Medications  potassium chloride 10 mEq in 100 mL IVPB (has no administration in time range)  promethazine (PHENERGAN) injection 12.5 mg (has no administration in time range)  meclizine (ANTIVERT) tablet 25 mg (25 mg Oral Given 11/08/20 0619)  potassium chloride (KLOR-CON) packet 40 mEq (40 mEq Oral Given 11/08/20 0619)  magnesium sulfate IVPB 2 g 50 mL ( Intravenous Restarted 11/08/20 0729)  sodium chloride 0.9 % bolus 1,000 mL ( Intravenous Restarted 11/08/20 0728)  gadobutrol (GADAVIST) 1 MMOL/ML injection 9 mL (9 mLs Intravenous Contrast Given 11/08/20 4174)     ED Discharge Orders    None      *Please note:  Tyler Gallagher was evaluated in Emergency Department on 11/08/2020 for the symptoms described in the history of present illness. He was evaluated in the context of the global COVID-19 pandemic, which necessitated consideration that the patient might be at risk for infection with the SARS-CoV-2 virus that causes COVID-19. Institutional protocols and algorithms that pertain to the evaluation of patients at risk for COVID-19 are in a state of rapid change based on information released by regulatory bodies including the CDC  and federal and state  organizations. These policies and algorithms were followed during the patient's care in the ED.  Some ED evaluations and interventions may be delayed as a result of limited staffing during and after the pandemic.*  Note:  This document was prepared using Dragon voice recognition software and may include unintentional dictation errors.   Hinda Kehr, MD 11/08/20 [phone]

## 2020-11-08 NOTE — Consult Note (Signed)
Tyler Gallagher  [phone] [phone]   Name: Tyler Gallagher Date: 11/08/2020 MRN: [id number]  DOB: Oct 26, 1961  Patient Care Team: Juluis Pitch, MD as PCP - General (Family Medicine) Kate Sable, MD as PCP - Cardiology (Cardiology) Telford Nab, RN as Oncology Nurse Navigator    REASON FOR CONSULTATION: Tyler Gallagher is a 59 y.o. 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 Patient was recently found to have a cardiomyopathy with an EF of 30 to 35%.  He has had ongoing chronic nausea and diarrhea throughout chemotherapy. Patient was admitted to the hospital 11/08/2020 after waking with severe vertigo.  MRI of the brain revealed a small cluster of nodularity in the left frontal parietal cortex concerning for possible subacute infarcts.  CT of the chest, abdomen, and pelvis revealed improvement in his primary lung nodule but no new interval development of liver metastasis.  He was referred to palliative care to help address goals and manage ongoing symptoms..   SOCIAL HISTORY:     reports that he quit smoking about 4 months ago. His smoking use included cigarettes. He has a 64.00 pack-year smoking history. He has never used smokeless tobacco. He reports previous drug use. Drug: Marijuana. He reports that he does not drink alcohol.  Patient lives at home with his girlfriend. His son previously lived in the home but is not currently.  He also has a daughter who does not live in the home.  Patient previously worked in [employer] but is disabled due to his AKA.  ADVANCE DIRECTIVES:  Not on file  CODE STATUS: Full code  PAST MEDICAL HISTORY: Past Medical History:    Diagnosis Date  . Diabetes mellitus without complication ([place])   . Hyperlipidemia   . Hypertension   . Lesion of brain   . Lung cancer ([place])   . Mass of lung     PAST SURGICAL HISTORY:  Past Surgical History:  Procedure Laterality Date  . 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 COLONOSCOPY    . CYSTOSCOPY W/ RETROGRADES Bilateral 07/14/2020   Procedure: CYSTOSCOPY WITH RETROGRADE PYELOGRAM;  Surgeon: Hollice Espy, MD;  Location: [facility];  Service: Urology;  Laterality: Bilateral;  . PORTA CATH INSERTION N/A 08/03/2020   Procedure: PORTA CATH INSERTION;  Surgeon: Katha Cabal, MD;  Location: [place] [facility];  Service: Cardiovascular;  Laterality: N/A;  . PORTA CATH INSERTION N/A 10/03/2020   Procedure: PORTA CATH INSERTION;  Surgeon: Algernon Huxley, MD;  Location: [place] [facility];  Service: Cardiovascular;  Laterality: N/A;  . VIDEO BRONCHOSCOPY WITH ENDOBRONCHIAL NAVIGATION N/A 07/01/2020   Procedure: VIDEO BRONCHOSCOPY WITH ENDOBRONCHIAL NAVIGATION;  Surgeon: [name], MD;  Location: [facility];  Service: Thoracic;  Laterality: N/A;  . VIDEO BRONCHOSCOPY WITH ENDOBRONCHIAL ULTRASOUND N/A 07/01/2020   Procedure: VIDEO BRONCHOSCOPY WITH ENDOBRONCHIAL ULTRASOUND;  Surgeon: [name], MD;  Location: [facility];  Service: Thoracic;  Laterality: N/A;    HEMATOLOGY/ONCOLOGY HISTORY:  Oncology History  Malignant neoplasm of lower lobe of right lung ([place])  07/07/2020 Cancer Staging   Staging form: Lung, AJCC 8th Edition - Clinical stage from 07/07/2020: Stage IVA (cT2a, cN0, cM1b) - Signed by [name], MD on 07/08/2020   07/08/2020 Initial Diagnosis  Malignant neoplasm of lower lobe of right lung ([place])   08/15/2020 -  Chemotherapy   The patient had dexamethasone (DECADRON) 4 MG tablet, 8 mg, Oral, Daily, 1 of 1 cycle, Start date: 07/26/2020, End date: 08/15/2020 palonosetron (ALOXI) injection 0.25 mg, 0.25 mg, Intravenous,  Once, 7 of 7 cycles Administration: 0.25 mg  (08/15/2020), 0.25 mg (09/22/2020), 0.25 mg (09/29/2020), 0.25 mg (09/15/2020), 0.25 mg (10/06/2020), 0.25 mg (10/13/2020), 0.25 mg (10/20/2020) CARBOplatin (PARAPLATIN) 300 mg in sodium chloride 0.9 % 250 mL chemo infusion, 300 mg (100 % of original dose 300 mg), Intravenous,  Once, 7 of 7 cycles Dose modification:   (original dose 300 mg, Cycle 1) Administration: 300 mg (09/22/2020), 300 mg (09/29/2020), 300 mg (09/15/2020), 300 mg (10/06/2020), 300 mg (10/13/2020), 300 mg (10/20/2020) PACLitaxel (TAXOL) 186 mg in sodium chloride 0.9 % 250 mL chemo infusion (</= 80mg /m2), 80 mg/m2 = 186 mg, Intravenous,  Once, 7 of 7 cycles Dose modification: 65 mg/m2 (original dose 80 mg/m2, Cycle 5, Reason: Dose not tolerated) Administration: 186 mg (09/22/2020), 186 mg (09/29/2020), 186 mg (09/15/2020), 150 mg (10/06/2020), 150 mg (10/13/2020), 150 mg (10/20/2020)  for chemotherapy treatment.      ALLERGIES:  is allergic to penicillins.  MEDICATIONS:  Current Facility-Administered Medications  Medication Dose Route Frequency Provider Last Rate Last Admin  . 0.9 %  sodium chloride infusion   Intravenous Continuous Ivor Costa, MD 75 mL/hr at 11/08/20 0923 New Bag at 11/08/20 0923  . acetaminophen (TYLENOL) tablet 650 mg  650 mg Oral Q6H PRN Ivor Costa, MD      . hydrOXYzine (ATARAX/VISTARIL) tablet 10 mg  10 mg Oral TID PRN Ivor Costa, MD      . hydrOXYzine (VISTARIL) injection 25 mg  25 mg Intramuscular Q6H PRN Ivor Costa, MD      . LORazepam (ATIVAN) tablet 0.5 mg  0.5 mg Oral Q8H PRN Ivor Costa, MD      . meclizine (ANTIVERT) tablet 25 mg  25 mg Oral TID PRN Ivor Costa, MD      . oxyCODONE (Oxy IR/ROXICODONE) immediate release tablet 5 mg  5 mg Oral Q6H PRN Ivor Costa, MD      . promethazine (PHENERGAN) injection 12.5 mg  12.5 mg Intravenous Q6H PRN Merlyn Lot, MD   12.5 mg at 11/08/20 7510   Facility-Administered Medications Ordered in Other Encounters  Medication Dose Route Frequency Provider Last Rate  Last Admin  . heparin lock flush 100 unit/mL  500 Units Intravenous Once Charlaine Dalton R, MD      . sodium chloride flush (NS) 0.9 % injection 10 mL  10 mL Intravenous Once Cammie Sickle, MD        VITAL SIGNS: BP (!) 124/92 (BP Location: Left Arm)   Pulse 100   Temp 97.6 F (36.4 C) (Oral)   Resp 15   Ht 6\' 3"  (1.905 m)   Wt 210 lb (95.3 kg)   SpO2 100%   BMI 26.25 kg/m  Filed Weights   11/08/20 0425  Weight: 210 lb (95.3 kg)    Estimated body mass index is 26.25 kg/m as calculated from the following:   Height as of this encounter: 6\' 3"  (1.905 m).   Weight as of this encounter: 210 lb (95.3 kg).  LABS: CBC:    Component Value Date/Time   WBC 2.4 (L) 11/08/2020 0432   HGB 11.7 (L) 11/08/2020 0432   HCT 35.4 (L) 11/08/2020 0432   PLT 180 11/08/2020 0432   MCV 93.4  11/08/2020 0432   NEUTROABS 1.4 (L) 11/08/2020 0432   LYMPHSABS 0.7 11/08/2020 0432   MONOABS 0.3 11/08/2020 0432   EOSABS 0.1 11/08/2020 0432   BASOSABS 0.0 11/08/2020 0432   Comprehensive Metabolic Panel:    Component Value Date/Time   NA 136 11/08/2020 0432   K 2.8 (L) 11/08/2020 0432   CL 97 (L) 11/08/2020 0432   CO2 22 11/08/2020 0432   BUN <5 (L) 11/08/2020 0432   CREATININE 0.52 (L) 11/08/2020 0432   GLUCOSE 97 11/08/2020 0432   CALCIUM 8.2 (L) 11/08/2020 0432   AST 12 (L) 11/08/2020 0432   ALT 10 11/08/2020 0432   ALKPHOS 67 11/08/2020 0432   BILITOT 1.4 (H) 11/08/2020 0432   PROT 6.5 11/08/2020 0432   ALBUMIN 3.1 (L) 11/08/2020 0432    RADIOGRAPHIC STUDIES: MR Brain W and Wo Contrast  Result Date: 11/08/2020 CLINICAL DATA:  Vertigo. History of lung cancer and brain metastases EXAM: MRI HEAD WITHOUT AND WITH CONTRAST TECHNIQUE: Multiplanar, multiecho pulse sequences of the brain and surrounding structures were obtained without and with intravenous contrast. CONTRAST:  52mL GADAVIST GADOBUTROL 1 MMOL/ML IV SOLN COMPARISON:  08/11/2020 FINDINGS: Brain: Recent left parafalcine  resection at the vertex with significantly diminished, essentially resolved, enhancement. Punctate foci of enhancement clustered in the superior right frontal lobe are resolved since prior but there are is a new area of similar size punctate enhancement clustered left parietal cortex which are marked on 97:989 and adjacent slices. In retrospect there was a nodule of enhancement along the parasagittal left parietal cortex which has increased in size and now measures 5 mm, consistent with metastasis. Persistent and unchanged 2 mm focus of enhancement in the anterior and medial right temporal lobe on 18:66. No acute infarct. Foci of restricted diffusion in the left cerebellum on prior have resolved. No hemorrhage, hydrocephalus, or masslike finding. 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 Vascular: Normal flow voids and vascular enhancements Skull and upper cervical spine: Unremarkable left-sided craniotomy. Sinuses/Orbits: Retention cyst in the right maxillary sinus and partial left mastoid opacification with negative nasopharynx. IMPRESSION: 1. No acute finding. 2. Diminished/resolved enhancement at the left parafalcine frontal lobe resection site. 3. 5 mm left parietal nodule which has slightly increased from a 08/11/2020 scan, consistent with metastatic disease. 4. Unchanged 2 mm of focus of enhancement in the medial right temporal lobe, also most consistent with metastasis. 5. On prior there was clustered punctate enhancing foci in the right frontal lobe which have resolved, favor subacute infarcts. Today there is a similar cluster of tiny nodular enhancement along the left frontal parietal cortex, which may also be subacute infarcts. Electronically Signed   By: Monte Fantasia M.D.   On: 11/08/2020 07:52   CT CHEST ABDOMEN PELVIS W CONTRAST  Result Date: 11/08/2020 CLINICAL DATA:  Metastatic small cell lung cancer. EXAM: CT CHEST, ABDOMEN, AND PELVIS WITH CONTRAST TECHNIQUE:  Multidetector CT imaging of the chest, abdomen and pelvis was performed following the standard protocol during bolus administration of intravenous contrast. CONTRAST:  174mL OMNIPAQUE IOHEXOL 300 MG/ML  SOLN COMPARISON:  None. FINDINGS: CT CHEST FINDINGS Cardiovascular: The heart is normal in size. No pericardial effusion. 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 No dissection. The branch vessels are patent. Stable coronary artery calcifications. The pulmonary arteries are unremarkable. Mediastinum/Nodes: No mediastinal or hilar mass or lymphadenopathy. The esophagus is grossly normal. Lungs/Pleura: Interval decrease in size of the right lower  lobe pulmonary mass. It measures approximately 2.3 x 2.0 cm on image 103/4. It previously measured a maximum of 3.8 x 3.5 cm. No pulmonary nodules to suggest pulmonary metastatic disease. No significant inflammatory or infectious changes. No pleural effusions. Stable emphysematous changes and areas of pulmonary scarring. Musculoskeletal: The chest wall is unremarkable. No supraclavicular or axillary adenopathy. The thyroid gland appears normal. The bony thorax is intact. CT ABDOMEN PELVIS FINDINGS Hepatobiliary: Vague low-attenuation liver lesions highly suspicious for metastatic disease. 12.5 mm lesion is noted at the left hepatic dome in segment 2. 13 mm lesion at the hepatic dome in segment 8 on image 53/2. Other smaller right hepatic lesions are noted. No biliary dilatation. Layering gallstones are noted the gallbladder. The common bile duct is normal in caliber. Pancreas: No mass, inflammation or ductal dilatation. Spleen: Normal size. No focal lesions crash that normal size. Small low-attenuation lesion anteriorly is likely a benign cyst. Adrenals/Urinary Tract: The adrenals are normal. No evidence of metastatic disease. No worrisome renal lesions or hydronephrosis. The bladder is unremarkable. Stomach/Bowel: The  stomach, duodenum, small bowel and colon are unremarkable. No acute inflammatory changes, mass lesions or obstructive findings. The terminal ileum is normal. The appendix is normal. Vascular/Lymphatic: Advanced atherosclerotic calcifications involving the aorta and iliac arteries but no aneurysm. No mesenteric or retroperitoneal mass or adenopathy. Reproductive: The prostate gland and seminal vesicles are unremarkable. Other: No pelvic mass or adenopathy. No free pelvic fluid collections. No inguinal mass or adenopathy. No abdominal wall hernia or subcutaneous lesions. Musculoskeletal: No significant bony findings. No worrisome bone lesions to suggest metastatic disease. IMPRESSION: 1. Interval decrease in size of the right lower lobe pulmonary mass. No mediastinal or hilar adenopathy. 2. No findings for pulmonary metastatic disease. 3. Vague low-attenuation liver lesions highly suspicious for metastatic disease. 4. No findings for abdominal/pelvic metastatic adenopathy. 5. Stable emphysematous changes and pulmonary scarring. 6. Advanced atherosclerotic calcifications involving the aorta and iliac arteries. 7. Cholelithiasis. 8. Emphysema and aortic atherosclerosis. Aortic Atherosclerosis (ICD10-I70.0) and Emphysema (ICD10-J43.9). Electronically Signed   By: Marijo Sanes M.D.   On: 11/08/2020 11:30   ECHOCARDIOGRAM COMPLETE  Result Date: 10/26/2020    ECHOCARDIOGRAM REPORT   Patient Name:   AULTON ROUTT Date of Exam: 10/26/2020 Medical Rec #:  [id number]        Height:       75.0 in Accession #:    [id number]       Weight:       208.0 lb Date of Birth:  25-Feb-1961        BSA:          2.231 m Patient Age:    78 years         BP:           144/76 mmHg Patient Gender: M                HR:           97 bpm. Exam Location:  [facility] Procedure: 2D Echo, Cardiac Doppler, Color Doppler and Intracardiac            Opacification Agent Indications:     Palpitations 785.1  History:         Patient has prior history of  Echocardiogram examinations, most                  recent 01/02/2016. Risk Factors:Hypertension, Dyslipidemia and  Diabetes. Lung cancer.  Sonographer:     Sherrie Sport RDCS (AE) Referring Phys:  [id number] [facility] RAO Diagnosing Phys: Bartholome Bill MD  Sonographer Comments: Technically difficult study due to poor echo windows, no apical window and no subcostal window. IMPRESSIONS  1. Left ventricular ejection fraction, by estimation, is 30 to 35%. The left ventricle has moderately decreased function. The left ventricle demonstrates regional wall motion abnormalities (see scoring diagram/findings for description). The left ventricular internal cavity size was mildly dilated. Left ventricular diastolic parameters were normal.  2. Right ventricular systolic function is normal. The right ventricular size is normal.  3. Left atrial size was mildly dilated.  4. The mitral valve was not well visualized. Trivial mitral valve regurgitation.  5. The aortic valve was not well visualized. Aortic valve regurgitation is not visualized. FINDINGS  Left Ventricle: Left ventricular ejection fraction, by estimation, is 30 to 35%. The left ventricle has moderately decreased function. The left ventricle demonstrates regional wall motion abnormalities. Definity contrast agent was given IV to delineate the left ventricular endocardial Junaid Wurzer. The left ventricular internal cavity size was mildly dilated. There is no left ventricular hypertrophy. Left ventricular diastolic parameters were normal.  LV Wall Scoring: The apical lateral segment, apical septal segment, and apex are akinetic. The antero-lateral wall, mid inferoseptal segment, and basal inferoseptal segment are hypokinetic. Right Ventricle: The right ventricular size is normal. No increase in right ventricular wall thickness. Right ventricular systolic function is normal. Left Atrium: Left atrial size was mildly dilated. Right Atrium: Right atrial size was normal in  size. Pericardium: There is no evidence of pericardial effusion. Mitral Valve: The mitral valve was not well visualized. Trivial mitral valve regurgitation. Tricuspid Valve: The tricuspid valve is not well visualized. Tricuspid valve regurgitation is trivial. Aortic Valve: The aortic valve was not well visualized. Aortic valve regurgitation is not visualized. Pulmonic Valve: The pulmonic valve was not well visualized. Pulmonic valve regurgitation is not visualized. Aorta: The aortic root is normal in size and structure. IAS/Shunts: The interatrial septum was not assessed.  LEFT VENTRICLE PLAX 2D LVIDd:         4.49 cm LVIDs:         3.77 cm LV PW:         1.11 cm LV IVS:        1.26 cm LVOT diam:     2.10 cm LVOT Area:     3.46 cm  LEFT ATRIUM         Index LA diam:    2.50 cm 1.12 cm/m                        PULMONIC VALVE AORTA                 PV Vmax:        0.47 m/s Ao Root diam: 3.30 cm PV Peak grad:   0.9 mmHg                       RVOT Peak grad: 1 mmHg   SHUNTS Systemic Diam: 2.10 cm Bartholome Bill MD Electronically signed by Bartholome Bill MD Signature Date/Time: 10/26/2020/2:57:45 PM    Final     PERFORMANCE STATUS (ECOG) : 3 - Symptomatic, >50% confined to bed  Review of Systems Unless otherwise noted, a complete review of systems is negative.  Physical Exam General: NAD Pulmonary: Unlabored GU: no suprapubic tenderness Extremities: Left AKA Skin: no  rashes Neurological: Weakness but otherwise nonfocal  IMPRESSION: Patient known to me from the clinic.  He says he is feeling "rough today."  He describes waking early this morning and feeling like the room was spinning.  He says his vertigo is somewhat improved since being admitted to the hospital.  However, he still endorses vertigo, particularly when he moves his head.  Additionally, he describes persistent nausea.  Patient has had chronic nausea throughout chemotherapy.  However, nausea seems less likely to be chemo induced as it has been 3  weeks since his last treatment.  Patient describes some symptoms of heartburn and says that he vomits almost every time and immediately after he eats.  Patient also continues to endorse chronic diarrhea for which he has been managing at home with as needed Imodium.  Case discussed with [name].  Given chronic nature of GI complaints, patient would likely benefit from GI evaluation during this hospitalization.  Additionally, he may also benefit from neuro eval for vertigo.  Patient does not have any advance directives.  He says that he would be consider establishing those but does not want to address it now.  He would want his son and sister to be involved in decision-making if needed.  Patient states that he wants to remain a full code for now but would not want to be prolonged on machines if care were deemed to be futile.  PLAN: -Continue current scope of treatment -Full code -Consider establishing ACP documents if patient will allow -Recommend GI/neuro eval -WIll follow   Time Total: 60 minutes  Visit consisted of counseling and education dealing with the complex and emotionally intense issues of symptom management and palliative care in the setting of serious and potentially life-threatening illness.Greater than 50%  of this time was spent counseling and coordinating care related to the above assessment and plan.  Signed by: Altha Harm, PhD, NP-C

## 2020-11-08 NOTE — ED Notes (Signed)
Patient transported to MRI 

## 2020-11-08 NOTE — Consult Note (Addendum)
Hematology/Oncology Consult note [facility] [phone] [phone]  Patient Care Team: Juluis Pitch, MD as PCP - General (Family Medicine) Kate Sable, MD as PCP - Cardiology (Cardiology) Telford Nab, RN as Oncology Nurse Navigator   Name of the patient: Tyler Gallagher  [id number]  1961-02-26    Reason for referral-history of metastatic lung cancer admitted for refractory nausea vomiting   Referring physician-Dr. 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  Last chemotherapy was given on 10/19/2020.  He completed radiation treatment the week after.  Patient has had persistent tachycardia during his chemoradiation for which she also underwent an echocardiogram which showed an EF of 30 to 35% and was seen by cardiology as an outpatient.  He was supposed to undergo stress test today as an outpatient but was admitted to the hospital for worsening nausea and vomiting.  He has had these issues over the last few weeks.  He was given IV fluids and potassium yesterday at the cancer center as well.  Patient reports having random episodes of nausea anytime he eats solid or liquid food and has to bring it up right away.states that this morning patient felt room was spinning around him and he felt dizzy upon waking up.   Currently reports nausea ia a little better but he still cant swallow food without feeling nauseous  ECOG PS- 2  Pain scale- 2   Review of systems- Review of Systems  Constitutional: Positive for malaise/fatigue. Negative for chills, fever and weight loss.  HENT: Negative for congestion, ear discharge and nosebleeds.   Eyes: Negative for blurred vision.  Respiratory: Negative for cough, hemoptysis, sputum production, shortness  of breath and wheezing.   Cardiovascular: Negative for chest pain, palpitations, orthopnea and claudication.  Gastrointestinal: Positive for diarrhea, nausea and vomiting. Negative for abdominal pain, blood in stool, constipation, heartburn and melena.  Genitourinary: Negative for dysuria, flank pain, frequency, hematuria and urgency.  Musculoskeletal: Negative for back pain, joint pain and myalgias.  Skin: Negative for rash.  Neurological: Negative for dizziness, tingling, focal weakness, seizures, weakness and headaches.  Endo/Heme/Allergies: Does not bruise/bleed easily.  Psychiatric/Behavioral: Negative for depression and suicidal ideas. The patient does not have insomnia.     Allergies  Allergen Reactions  . Penicillins Other (See Comments)    unknown    Patient Active Problem List   Diagnosis Date Noted  . Dizziness 11/08/2020  . Vertigo 11/08/2020  . Chronic systolic CHF (congestive heart failure) ([place]) 11/08/2020  . Hypomagnesemia 11/08/2020  . Hypotension 11/08/2020  . Hypokalemia 11/08/2020  . Nausea vomiting and diarrhea 11/08/2020  . Anxiety 11/08/2020  . Palliative care encounter   . Chemotherapy induced neutropenia ([place]) 09/29/2020  . Goals of care, counseling/discussion 07/08/2020  . Malignant neoplasm of lower lobe of right lung ([place]) 07/08/2020  . Brain metastasis ([place]) 06/22/2020  . Vasogenic brain edema ([place]) 06/21/2020  . Hyperlipidemia 01/02/2016  . Hypertriglyceridemia 01/02/2016  . Tobacco abuse 01/02/2016  . Type 2 diabetes mellitus without complication, without long-term current use of insulin ([place]) 01/02/2016  . Obesity 01/02/2016  . S/P AKA (above knee amputation) unilateral ([place]) 01/02/2016  . Amaurosis fugax 01/01/2016  . TIA (transient ischemic attack) 01/01/2016     Past Medical History:  Diagnosis Date  . Diabetes mellitus without complication ([place])   . Hyperlipidemia   .  Hypertension   . Lesion of brain   . Lung cancer ([place])   . Mass  of lung      Past Surgical History:  Procedure Laterality Date  . 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 COLONOSCOPY    . CYSTOSCOPY W/ RETROGRADES Bilateral 07/14/2020   Procedure: CYSTOSCOPY WITH RETROGRADE PYELOGRAM;  Surgeon: Hollice Espy, MD;  Location: [facility];  Service: Urology;  Laterality: Bilateral;  . PORTA CATH INSERTION N/A 08/03/2020   Procedure: PORTA CATH INSERTION;  Surgeon: Katha Cabal, MD;  Location: [place] [facility];  Service: Cardiovascular;  Laterality: N/A;  . PORTA CATH INSERTION N/A 10/03/2020   Procedure: PORTA CATH INSERTION;  Surgeon: Algernon Huxley, MD;  Location: [place] [facility];  Service: Cardiovascular;  Laterality: N/A;  . VIDEO BRONCHOSCOPY WITH ENDOBRONCHIAL NAVIGATION N/A 07/01/2020   Procedure: VIDEO BRONCHOSCOPY WITH ENDOBRONCHIAL NAVIGATION;  Surgeon: [name], MD;  Location: [facility];  Service: Thoracic;  Laterality: N/A;  . VIDEO BRONCHOSCOPY WITH ENDOBRONCHIAL ULTRASOUND N/A 07/01/2020   Procedure: VIDEO BRONCHOSCOPY WITH ENDOBRONCHIAL ULTRASOUND;  Surgeon: [name], MD;  Location: [facility];  Service: Thoracic;  Laterality: N/A;    Social History   Socioeconomic History  . Marital status: Divorced    Spouse name: Not on file  . Number of children: Not on file  . Years of education: Not on file  . Highest education level: Not on file  Occupational History  . Not on file  Tobacco Use  . Smoking status: Former Smoker    Packs/day: 2.00    Years: 32.00    Pack years: 64.00    Types: Cigarettes    Quit date: 06/21/2020    Years since quitting: 0.3  . Smokeless tobacco: Never Used  Vaping Use  . Vaping Use: Never used  Substance and Sexual Activity  . Alcohol use: No    Alcohol/week: 0.0 standard drinks  . Drug use: Not Currently    Types: Marijuana  . Sexual activity: Not on file  Other Topics Concern  . Not on file  Social History Narrative   Lives at home with girlfriend   Social Determinants of Health    Financial Resource Strain:   . Difficulty of Paying Living Expenses: Not on file  Food Insecurity:   . Worried About Charity fundraiser in the Last Year: Not on file  . Ran Out of Food in the Last Year: Not on file  Transportation Needs:   . Lack of Transportation (Medical): Not on file  . Lack of Transportation (Non-Medical): Not on file  Physical Activity:   . Days of Exercise per Week: Not on file  . Minutes of Exercise per Session: Not on file  Stress:   . Feeling of Stress : Not on file  Social Connections:   . Frequency of Communication with Friends and Family: Not on file  . Frequency of Social Gatherings with Friends and Family: Not on file  . Attends Religious Services: Not on file  . Active Member of Clubs or Organizations: Not on file  . Attends Archivist Meetings: Not on file  . Marital Status: Not on file  Intimate Partner Violence:   . Fear of Current or Ex-Partner: Not on file  . Emotionally Abused: Not on file  . Physically Abused: Not on file  . Sexually Abused: Not on file     Family History  Problem Relation Age of Onset  . Diabetes Sister   .  Diabetes Brother      Current Facility-Administered Medications:  .  0.9 %  sodium chloride infusion, , Intravenous, Continuous, Ivor Costa, MD, Last Rate: 75 mL/hr at 11/08/20 1500, Rate Verify at 11/08/20 1500 .  acetaminophen (TYLENOL) tablet 650 mg, 650 mg, Oral, Q6H PRN, Ivor Costa, MD .  hydrOXYzine (ATARAX/VISTARIL) tablet 10 mg, 10 mg, Oral, TID PRN, Ivor Costa, MD .  hydrOXYzine (VISTARIL) injection 25 mg, 25 mg, Intramuscular, Q6H PRN, Ivor Costa, MD .  LORazepam (ATIVAN) tablet 0.5 mg, 0.5 mg, Oral, Q8H PRN, Ivor Costa, MD .  meclizine (ANTIVERT) tablet 25 mg, 25 mg, Oral, TID PRN, Ivor Costa, MD .  oxyCODONE (Oxy IR/ROXICODONE) immediate release tablet 5 mg, 5 mg, Oral, Q6H PRN, Ivor Costa, MD .  promethazine (PHENERGAN) injection 12.5 mg, 12.5 mg, Intravenous, Q6H PRN, Merlyn Lot,  MD, 12.5 mg at 11/08/20 6986  Facility-Administered Medications Ordered in Other Encounters:  .  heparin lock flush 100 unit/mL, 500 Units, Intravenous, Once, Brahmanday, Lenetta Quaker R, MD .  sodium chloride flush (NS) 0.9 % injection 10 mL, 10 mL, Intravenous, Once, Cammie Sickle, MD   Physical exam:  Vitals:   11/08/20 1000 11/08/20 1200 11/08/20 1230 11/08/20 1324  BP: (!) 128/93 (!) 151/94  (!) 124/92  Pulse: (!) 114 (!) 104  100  Resp: (!) '24  20 15  ' Temp:    97.6 F (36.4 C)  TempSrc:    Oral  SpO2: 98% 96%  100%  Weight:      Height:       Physical Exam HENT:     Mouth/Throat:     Mouth: Mucous membranes are moist.     Pharynx: Oropharynx is clear.  Cardiovascular:     Rate and Rhythm: Tachycardia present.  Musculoskeletal:     Comments: S/p left aka        CMP Latest Ref Rng & Units 11/08/2020  Glucose 70 - 99 mg/dL 97  BUN 6 - 20 mg/dL <5(L)  Creatinine 0.61 - 1.24 mg/dL 0.52(L)  Sodium 135 - 145 mmol/L 136  Potassium 3.5 - 5.1 mmol/L 2.8(L)  Chloride 98 - 111 mmol/L 97(L)  CO2 22 - 32 mmol/L 22  Calcium 8.9 - 10.3 mg/dL 8.2(L)  Total Protein 6.5 - 8.1 g/dL 6.5  Total Bilirubin 0.3 - 1.2 mg/dL 1.4(H)  Alkaline Phos 38 - 126 U/L 67  AST 15 - 41 U/L 12(L)  ALT 0 - 44 U/L 10   CBC Latest Ref Rng & Units 11/08/2020  WBC 4.0 - 10.5 K/uL 2.4(L)  Hemoglobin 13.0 - 17.0 g/dL 11.7(L)  Hematocrit 39 - 52 % 35.4(L)  Platelets 150 - 400 K/uL 180    '@IMAGES' @  MR Brain W and Wo Contrast  Result Date: 11/08/2020 CLINICAL DATA:  Vertigo. History of lung cancer and brain metastases EXAM: MRI HEAD WITHOUT AND WITH CONTRAST TECHNIQUE: Multiplanar, multiecho pulse sequences of the brain and surrounding structures were obtained without and with intravenous contrast. CONTRAST:  48m GADAVIST GADOBUTROL 1 MMOL/ML IV SOLN COMPARISON:  08/11/2020 FINDINGS: Brain: Recent left parafalcine resection at the vertex with significantly diminished, essentially resolved,  enhancement. Punctate foci of enhancement clustered in the superior right frontal lobe are resolved since prior but there are is a new area of similar size punctate enhancement clustered left parietal cortex which are marked on 114:830and adjacent slices. In retrospect there was a nodule of enhancement along the parasagittal left parietal cortex which has increased in size and now measures 5  mm, consistent with metastasis. Persistent and unchanged 2 mm focus of enhancement in the anterior and medial right temporal lobe on 18:66. No acute infarct. Foci of restricted diffusion in the left cerebellum on prior have resolved. No hemorrhage, hydrocephalus, or masslike finding. 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 Vascular: Normal flow voids and vascular enhancements Skull and upper cervical spine: Unremarkable left-sided craniotomy. Sinuses/Orbits: Retention cyst in the right maxillary sinus and partial left mastoid opacification with negative nasopharynx. IMPRESSION: 1. No acute finding. 2. Diminished/resolved enhancement at the left parafalcine frontal lobe resection site. 3. 5 mm left parietal nodule which has slightly increased from a 08/11/2020 scan, consistent with metastatic disease. 4. Unchanged 2 mm of focus of enhancement in the medial right temporal lobe, also most consistent with metastasis. 5. On prior there was clustered punctate enhancing foci in the right frontal lobe which have resolved, favor subacute infarcts. Today there is a similar cluster of tiny nodular enhancement along the left frontal parietal cortex, which may also be subacute infarcts. Electronically Signed   By: Monte Fantasia M.D.   On: 11/08/2020 07:52   CT CHEST ABDOMEN PELVIS W CONTRAST  Result Date: 11/08/2020 CLINICAL DATA:  Metastatic small cell lung cancer. EXAM: CT CHEST, ABDOMEN, AND PELVIS WITH CONTRAST TECHNIQUE: Multidetector CT imaging of the chest, abdomen and pelvis was performed following the  standard protocol during bolus administration of intravenous contrast. CONTRAST:  151m OMNIPAQUE IOHEXOL 300 MG/ML  SOLN COMPARISON:  None. FINDINGS: CT CHEST FINDINGS Cardiovascular: The heart is normal in size. No pericardial effusion. 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 No dissection. The branch vessels are patent. Stable coronary artery calcifications. The pulmonary arteries are unremarkable. Mediastinum/Nodes: No mediastinal or hilar mass or lymphadenopathy. The esophagus is grossly normal. Lungs/Pleura: Interval decrease in size of the right lower lobe pulmonary mass. It measures approximately 2.3 x 2.0 cm on image 103/4. It previously measured a maximum of 3.8 x 3.5 cm. No pulmonary nodules to suggest pulmonary metastatic disease. No significant inflammatory or infectious changes. No pleural effusions. Stable emphysematous changes and areas of pulmonary scarring. Musculoskeletal: The chest wall is unremarkable. No supraclavicular or axillary adenopathy. The thyroid gland appears normal. The bony thorax is intact. CT ABDOMEN PELVIS FINDINGS Hepatobiliary: Vague low-attenuation liver lesions highly suspicious for metastatic disease. 12.5 mm lesion is noted at the left hepatic dome in segment 2. 13 mm lesion at the hepatic dome in segment 8 on image 53/2. Other smaller right hepatic lesions are noted. No biliary dilatation. Layering gallstones are noted the gallbladder. The common bile duct is normal in caliber. Pancreas: No mass, inflammation or ductal dilatation. Spleen: Normal size. No focal lesions crash that normal size. Small low-attenuation lesion anteriorly is likely a benign cyst. Adrenals/Urinary Tract: The adrenals are normal. No evidence of metastatic disease. No worrisome renal lesions or hydronephrosis. The bladder is unremarkable. Stomach/Bowel: The stomach, duodenum, small bowel and colon are unremarkable. No acute inflammatory  changes, mass lesions or obstructive findings. The terminal ileum is normal. The appendix is normal. Vascular/Lymphatic: Advanced atherosclerotic calcifications involving the aorta and iliac arteries but no aneurysm. No mesenteric or retroperitoneal mass or adenopathy. Reproductive: The prostate gland and seminal vesicles are unremarkable. Other: No pelvic mass or adenopathy. No free pelvic fluid collections. No inguinal mass or adenopathy. No abdominal wall hernia or subcutaneous lesions. Musculoskeletal: No significant bony findings. No worrisome bone lesions to suggest metastatic disease. IMPRESSION: 1. Interval  decrease in size of the right lower lobe pulmonary mass. No mediastinal or hilar adenopathy. 2. No findings for pulmonary metastatic disease. 3. Vague low-attenuation liver lesions highly suspicious for metastatic disease. 4. No findings for abdominal/pelvic metastatic adenopathy. 5. Stable emphysematous changes and pulmonary scarring. 6. Advanced atherosclerotic calcifications involving the aorta and iliac arteries. 7. Cholelithiasis. 8. Emphysema and aortic atherosclerosis. Aortic Atherosclerosis (ICD10-I70.0) and Emphysema (ICD10-J43.9). Electronically Signed   By: Marijo Sanes M.D.   On: 11/08/2020 11:30   ECHOCARDIOGRAM COMPLETE  Result Date: 10/26/2020    ECHOCARDIOGRAM REPORT   Patient Name:   HARSHAAN WHANG Date of Exam: 10/26/2020 Medical Rec #:  [id number]        Height:       75.0 in Accession #:    [id number]       Weight:       208.0 lb Date of Birth:  05/15/1961        BSA:          2.231 m Patient Age:    70 years         BP:           144/76 mmHg Patient Gender: M                HR:           97 bpm. Exam Location:  [facility] Procedure: 2D Echo, Cardiac Doppler, Color Doppler and Intracardiac            Opacification Agent Indications:     Palpitations 785.1  History:         Patient has prior history of Echocardiogram examinations, most                  recent 01/02/2016. Risk  Factors:Hypertension, Dyslipidemia and                  Diabetes. Lung cancer.  Sonographer:     Sherrie Sport RDCS (AE) Referring Phys:  [id number] [facility] Abdur Hoglund Diagnosing Phys: Bartholome Bill MD  Sonographer Comments: Technically difficult study due to poor echo windows, no apical window and no subcostal window. IMPRESSIONS  1. Left ventricular ejection fraction, by estimation, is 30 to 35%. The left ventricle has moderately decreased function. The left ventricle demonstrates regional wall motion abnormalities (see scoring diagram/findings for description). The left ventricular internal cavity size was mildly dilated. Left ventricular diastolic parameters were normal.  2. Right ventricular systolic function is normal. The right ventricular size is normal.  3. Left atrial size was mildly dilated.  4. The mitral valve was not well visualized. Trivial mitral valve regurgitation.  5. The aortic valve was not well visualized. Aortic valve regurgitation is not visualized. FINDINGS  Left Ventricle: Left ventricular ejection fraction, by estimation, is 30 to 35%. The left ventricle has moderately decreased function. The left ventricle demonstrates regional wall motion abnormalities. Definity contrast agent was given IV to delineate the left ventricular endocardial borders. The left ventricular internal cavity size was mildly dilated. There is no left ventricular hypertrophy. Left ventricular diastolic parameters were normal.  LV Wall Scoring: The apical lateral segment, apical septal segment, and apex are akinetic. The antero-lateral wall, mid inferoseptal segment, and basal inferoseptal segment are hypokinetic. Right Ventricle: The right ventricular size is normal. No increase in right ventricular wall thickness. Right ventricular systolic function is normal. Left Atrium: Left atrial size was mildly dilated. Right Atrium: Right atrial size was normal in size. Pericardium: There is no evidence  of pericardial effusion. Mitral  Valve: The mitral valve was not well visualized. Trivial mitral valve regurgitation. Tricuspid Valve: The tricuspid valve is not well visualized. Tricuspid valve regurgitation is trivial. Aortic Valve: The aortic valve was not well visualized. Aortic valve regurgitation is not visualized. Pulmonic Valve: The pulmonic valve was not well visualized. Pulmonic valve regurgitation is not visualized. Aorta: The aortic root is normal in size and structure. IAS/Shunts: The interatrial septum was not assessed.  LEFT VENTRICLE PLAX 2D LVIDd:         4.49 cm LVIDs:         3.77 cm LV PW:         1.11 cm LV IVS:        1.26 cm LVOT diam:     2.10 cm LVOT Area:     3.46 cm  LEFT ATRIUM         Index LA diam:    2.50 cm 1.12 cm/m                        PULMONIC VALVE AORTA                 PV Vmax:        0.47 m/s Ao Root diam: 3.30 cm PV Peak grad:   0.9 mmHg                       RVOT Peak grad: 1 mmHg   SHUNTS Systemic Diam: 2.10 cm Bartholome Bill MD Electronically signed by Bartholome Bill MD Signature Date/Time: 10/26/2020/2:57:45 PM    Final     Assessment and plan- Patient is a 59 y.o. male with stage IV adenocarcinoma of the lung s/p concurrent chemoradiation as well as whole brain radiation admitted for refractory nausea and vomiting  Nausea vomiting: Possible differentials include radiation esophagitis versus chemo-induced nausea vomiting although chemotherapy was given almost 3 weeks ago.  Recent MRI brain shows no new areas of metastatic disease which are subcentimeters in size as well as prior subacute infarcts. It may be worthwhile to get GI on board for consideration for EGD to rule out any other etiology such as CMV esophagitis that can explain his nausea vomiting.  However he does have a EF of 35% which has not been completely evaluated yet.  Patient does report some symptomatic improvement after receiving IV fluids.  He did report symptoms of vertigo prior to admission as well.  Areas of subacute infarcts  noted on MRI brain do not necessarily correlate with central cause contributing to nausea.  May be reasonable to consider neurology evaluation however.    I have started him on IV PPI.   Stage IV lung cancer: He had a repeat CT chest abdomen pelvis with contrast post chemoradiation which shows reduced size of the right lower lobe pulmonary mass.  Low-attenuation lesions were noted in the liver which were concerning for metastatic disease.  Presently patient is having a hard time recovering from his chemoradiation.  I will allow him to recover from these acute side effects before considering further treatment.  NGS testing did show a high PD-L1 score of 70% indicating a good response to immunotherapy which can be potentially considered as maintenance versus second line treatment.  Severe hypokalemia- will need replacement  Diarrhea- stool studies pending     Visit Diagnosis 1. Vertigo   2. Nausea & vomiting     Dr. Randa Evens, MD, MPH [place] at  [facility] [phone] 11/08/2020  10:23 PM

## 2020-11-08 NOTE — ED Notes (Signed)
Pt back in room. Pt vomited 264mL clear green-colored emesis. Pt sitting up in bed. VS assessed.

## 2020-11-08 NOTE — ED Triage Notes (Signed)
Pt endorses dizziness and feeling of "the room spinning" starting right before calling EMS. States he was asleep and woke up to the room spinning. Hx lung and brain cancer. Denies new treatments/medications, endorses no appetite and decreased PO intake.

## 2020-11-08 NOTE — Progress Notes (Signed)
PCR reflex order is placed. Unable to obtain at this time. Patient has not had BM during my shift. MD notified.   Thresa Ross, RN

## 2020-11-08 NOTE — Evaluation (Signed)
Occupational Therapy Evaluation Patient Details Name: Tyler Gallagher MRN: [id number] DOB: 26-May-1961 Today's Date: 11/08/2020    History of Present Illness Tyler Gallagher is a 59 y.o. male with medical history significant of hypertension, hyperlipidemia, diabetes mellitus, TIA, anxiety, lung cancer with brain metastases, left AKA, chemotherapy-induced neutropenia, former smoker, who presents with dizziness. Pt states that he started feeling dizzy this morning, also has feeling of room spinning. She has nausea, vomiting and diarrhea.  Patient states that he vomited 3 times with nonbilious nonbloody vomiting. He has 2 loose stool bowel movement.  No abdominal pain.  He has generalized weakness, but no unilateral numbness or tingling in his extremities.  No vision loss or hearing loss.  No facial droop.  Patient has mild dry cough and mild shortness of breath chronically, which has not changed.  No chest pain, fever or chills.  No symptoms of UTI. Nothing in particular makes the symptoms better or worse.  Initially patient had hypotension with blood pressure 89/79, which improved to 130/85 after giving 1 L normal saline in ED.   Clinical Impression   Tyler Gallagher came by EMS to ED this AM, after waking up with extreme dizziness, feeling as if "the room was spinning." Prior to today's hospitalization, Tyler Gallagher lived in a one-story, ramped home with his girlfriend. He has an L AKA and uses a manual WC for mobility Since summer of 2000, he has been having multiple rounds of radiation and chemotherapy, and recently had surgery to remove a brain tumor, following metastasis of his lung CA to brain. He has remained IND in ADL and IADL, but reports feeling like he is becoming weak. He reports that he uses the bathtub at home, IND'ly lowering himself into and out of the tub, but worries that he may not have the strength to do so for much longer. He no longer enjoys eating, reporting "all food tastes like metal" to  him now, and he has lost ~ 100 pounds since his CA diagnosis. During today's OT evaluation, Tyler Gallagher is IND in bed mobility, good sitting balance EOB while reaching outside of BOS. Unable to test transfers, as pt's WC is not available. Pt reports no pain at present, but complains of nausea, vertigo, and dizziness, which is unaffected by movement. Recommend continuing OT while hospitalized, to address functional limitations. In addition, recommend HHOT post DC, to address Tyler Gallagher concerns with increasing weakness, endurance, and declining abilities to complete ADL/IADL independently. If Tyler Gallagher is willing, palliative care and/or hospice consult would also be appropriate.    Follow Up Recommendations  Home health OT    Equipment Recommendations  None recommended by OT    Recommendations for Other Services  (palliative care consult, if pt is agreeable)     Precautions / Restrictions Precautions Precautions: None Restrictions Weight Bearing Restrictions: No      Mobility Bed Mobility Overal bed mobility: Independent                  Transfers Overall transfer level: Modified independent Equipment used: Pushed w/c             General transfer comment: Pt performs SPT from seated surfaces to WC.    Balance Overall balance assessment: Needs assistance Sitting-balance support: No upper extremity supported;Feet supported (Right foot supported) Sitting balance-Leahy Scale: Normal       Standing balance-Leahy Scale: Zero Standing balance comment: L AKA, does not use RW. Transfers sitting<>sitting, balancing briefly on RLE, but no  standing.                           ADL either performed or assessed with clinical judgement   ADL Overall ADL's : Modified independent                                       General ADL Comments: Mod I with manual WC. Girlfriend does most shopping. He used to do all the cooking, but at present cannot stand  the smell of food, 2/2 chemotherapy. Has lost 100# in past 12 months.     Vision   Additional Comments: Pt reports "room is spinning"     Perception     Praxis      Pertinent Vitals/Pain Pain Assessment: No/denies pain     Hand Dominance     Extremity/Trunk Assessment Upper Extremity Assessment Upper Extremity Assessment: Overall WFL for tasks assessed   Lower Extremity Assessment Lower Extremity Assessment: LLE deficits/detail LLE Deficits / Details: AKA, L LE       Communication Communication Communication: No difficulties   Cognition Arousal/Alertness: Awake/alert Behavior During Therapy: WFL for tasks assessed/performed Overall Cognitive Status: Within Functional Limits for tasks assessed                                     General Comments  Pt reports poor appetite, "nothing tastes good."    Exercises Other Exercises Other Exercises: Educ re: role of OT, POC, medication mgmt., DC options, EC   Shoulder Instructions      Home Living Family/patient expects to be discharged to:: Private residence Living Arrangements: Spouse/significant other Available Help at Discharge: Family;Available 24 hours/day;Available PRN/intermittently (significant other retired, available 24 hrs/day. Children live in [place], available intermittently) Type of Home: Mobile home Home Access: Ramped entrance     Home Layout: One level     Bathroom Shower/Tub: Walk-in shower;Tub only   Bathroom Toilet: Handicapped height Bathroom Accessibility: No   Home Equipment: Wheelchair - manual          Prior Functioning/Environment Level of Independence: Independent with assistive device(s)        Comments: Manual WC as primary mobility, completes SPT to/from all surfaces w/o AD, Mod I. Drives. Retired.        OT Problem List: Decreased activity tolerance;Impaired balance (sitting and/or standing);Decreased strength;Decreased coordination       OT Treatment/Interventions: Self-care/ADL training;Balance training;Therapeutic activities;Therapeutic exercise;Energy conservation;Patient/family education    OT Goals(Current goals can be found in the care plan section) Acute Rehab OT Goals Patient Stated Goal: to feel better OT Goal Formulation: With patient Time For Goal Achievement: 11/22/20 Potential to Achieve Goals: Good  OT Frequency: Min 1X/week   Barriers to D/C:            Co-evaluation              AM-PAC OT "6 Clicks" Daily Activity     Outcome Measure Help from another person eating meals?: None Help from another person taking care of personal grooming?: None Help from another person toileting, which includes using toliet, bedpan, or urinal?: A Little Help from another person bathing (including washing, rinsing, drying)?: A Little Help from another person to put on and taking off regular upper body clothing?: None Help from another  person to put on and taking off regular lower body clothing?: None 6 Click Score: 22   End of Session Nurse Communication: Mobility status  Activity Tolerance: Patient tolerated treatment well Patient left: in bed;with call bell/phone within reach;with nursing/sitter in room  OT Visit Diagnosis: Unsteadiness on feet (R26.81);Muscle weakness (generalized) (M62.81);Other abnormalities of gait and mobility (R26.89);Dizziness and giddiness (R42)                Time: 6389-3734 OT Time Calculation (min): 33 min Charges:  OT General Charges $OT Visit: 1 Visit OT Evaluation $OT Eval Moderate Complexity: 1 Mod OT Treatments $Self Care/Home Management : 23-37 mins  Josiah Lobo, PhD, MS, OTR/L ascom [phone] 11/08/20, 1:54 PM

## 2020-11-09 ENCOUNTER — Other Ambulatory Visit: Payer: Self-pay | Admitting: Oncology

## 2020-11-09 DIAGNOSIS — R42 Dizziness and giddiness: Secondary | ICD-10-CM | POA: Diagnosis not present

## 2020-11-09 DIAGNOSIS — R112 Nausea with vomiting, unspecified: Secondary | ICD-10-CM | POA: Diagnosis not present

## 2020-11-09 LAB — MAGNESIUM: Magnesium: 1.7 mg/dL (ref 1.7–2.4)

## 2020-11-09 LAB — CBC
HCT: 33.7 % — ABNORMAL LOW (ref 39.0–52.0)
Hemoglobin: 11 g/dL — ABNORMAL LOW (ref 13.0–17.0)
MCH: 30.9 pg (ref 26.0–34.0)
MCHC: 32.6 g/dL (ref 30.0–36.0)
MCV: 94.7 fL (ref 80.0–100.0)
Platelets: 150 10*3/uL (ref 150–400)
RBC: 3.56 MIL/uL — ABNORMAL LOW (ref 4.22–5.81)
RDW: 18.1 % — ABNORMAL HIGH (ref 11.5–15.5)
WBC: 1.9 10*3/uL — ABNORMAL LOW (ref 4.0–10.5)
nRBC: 0 % (ref 0.0–0.2)

## 2020-11-09 LAB — BASIC METABOLIC PANEL
Anion gap: 11 (ref 5–15)
BUN: <5 mg/dL — ABNORMAL LOW (ref 6–20)
CO2: 26 mmol/L (ref 22–32)
Calcium: 8.2 mg/dL — ABNORMAL LOW (ref 8.9–10.3)
Chloride: 101 mmol/L (ref 98–111)
Creatinine, Ser: 0.5 mg/dL — ABNORMAL LOW (ref 0.61–1.24)
GFR, Estimated: >60 mL/min (ref >60)
Glucose, Bld: 68 mg/dL — ABNORMAL LOW (ref 70–99)
Potassium: 3.1 mmol/L — ABNORMAL LOW (ref 3.5–5.1)
Sodium: 138 mmol/L (ref 135–145)

## 2020-11-09 LAB — CLOSTRIDIUM DIFFICILE BY PCR, REFLEXED: Toxigenic C. Difficile by PCR: POSITIVE — AB

## 2020-11-09 LAB — C DIFFICILE QUICK SCREEN W PCR REFLEX
C Diff antigen: POSITIVE — AB
C Diff toxin: NEGATIVE

## 2020-11-09 LAB — GLUCOSE, CAPILLARY: Glucose-Capillary: 62 mg/dL — ABNORMAL LOW (ref 70–99)

## 2020-11-09 MED ORDER — CHLORHEXIDINE GLUCONATE CLOTH 2 % EX PADS
6.0000 | MEDICATED_PAD | Freq: Every day | CUTANEOUS | Status: DC
  Administered 2020-11-09 – 2020-11-10 (×2): 6 via TOPICAL

## 2020-11-09 MED ORDER — POTASSIUM CHLORIDE 20 MEQ PO PACK
40.0000 meq | PACK | Freq: Two times a day (BID) | ORAL | Status: AC
  Administered 2020-11-09 (×2): 40 meq via ORAL
  Filled 2020-11-09 (×2): qty 2

## 2020-11-09 MED ORDER — FAMOTIDINE IN NACL 20-0.9 MG/50ML-% IV SOLN
20.0000 mg | Freq: Two times a day (BID) | INTRAVENOUS | Status: DC
  Administered 2020-11-10: 20 mg via INTRAVENOUS
  Filled 2020-11-09: qty 50

## 2020-11-09 MED ORDER — VANCOMYCIN 50 MG/ML ORAL SOLUTION
125.0000 mg | Freq: Four times a day (QID) | ORAL | Status: DC
  Administered 2020-11-09 – 2020-11-10 (×5): 125 mg via ORAL
  Filled 2020-11-09 (×8): qty 2.5

## 2020-11-09 MED ORDER — PROMETHAZINE HCL 25 MG/ML IJ SOLN: 12.5000 mg | Freq: Four times a day (QID) | INTRAMUSCULAR | Status: DC | PRN

## 2020-11-09 MED ORDER — ACETAMINOPHEN 325 MG PO TABS: 650.0000 mg | ORAL_TABLET | Freq: Four times a day (QID) | ORAL | Status: DC | PRN

## 2020-11-09 NOTE — TOC Initial Note (Signed)
Transition of Care [facility]) - Initial/Assessment Note    Patient Details  Name: Tyler Gallagher MRN: [id number] Date of Birth: 07/31/1961  Transition [facility] CM/SW Contact:    Beverly Sessions, RN Phone Number: 11/09/2020, 3:43 PM  Clinical Narrative:                 Patient admitted from home with vertigo Patient lives at home with significant other  Patient with history of lung cancer with mets  PCP [place].  Patient states that his girlfriend or friends provide transportation.  Ph[facility]acy [facility]-  States he does not have issues getting his medications  Patient states that he has a WC, electric WC and crutches at home  PT has assessed patient and recommends PT.  Patient states "ive been getting around this way since 2005 and don't need anyone coming out" Discussed the option of having RN and declining patient still declines. Palliative has seen patient while in the hospital.  Discussed patient  Outpatient palliative with patient. He declines at this time MD notified    Expected Discharge Plan: Home/Self Care Barriers to Discharge: Continued Medical Work up   Patient Goals and CMS Choice        Expected Discharge Plan and Services Expected Discharge Plan: Home/Self Care       Living arrangements for the past 2 months: Single Family Home                           HH Arranged: Patient Refused HH          Prior Living Arrangements/Services Living arrangements for the past 2 months: Single Family Home Lives with:: Significant Other Patient language and need for interpreter reviewed:: Yes Do you feel safe going back to the place where you live?: Yes      Need for Family Participation in Patient Care: Yes (Comment) Care giver support system in place?: Yes (comment) Current home services: DME Criminal Activity/Legal Involvement Pertinent to Current Situation/Hospitalization: No - Comment as needed  Activities of Daily Living Home Assistive Devices/Equipment:  Wheelchair, CBG Meter, Blood pressure cuff ADL Screening (condition at time of admission) Patient's cognitive ability adequate to safely complete daily activities?: Yes Is the patient deaf or have difficulty hearing?: No Does the patient have difficulty seeing, even when wearing glasses/contacts?: No Does the patient have difficulty concentrating, remembering, or making decisions?: No Patient able to express need for assistance with ADLs?: Yes Does the patient have difficulty dressing or bathing?: No Independently performs ADLs?: Yes (appropriate for developmental age) Does the patient have difficulty walking or climbing stairs?: No Weakness of Legs: None Weakness of Arms/Hands: None  Permission Sought/Granted                  Emotional Assessment       Orientation: : Oriented to Self, Oriented to Place, Oriented to  Time, Oriented to Situation      Admission diagnosis:  Vertigo [R42] Nausea & vomiting [R11.2] Patient Active Problem List   Diagnosis Date Noted  . Dizziness 11/08/2020  . Vertigo 11/08/2020  . Chronic systolic CHF (congestive heart failure) ([place]) 11/08/2020  . Hypomagnesemia 11/08/2020  . Hypotension 11/08/2020  . Hypokalemia 11/08/2020  . Nausea vomiting and diarrhea 11/08/2020  . Anxiety 11/08/2020  . Palliative care encounter   . Chemotherapy induced neutropenia ([place]) 09/29/2020  . Goals of care, counseling/discussion 07/08/2020  . Malignant neoplasm of lower lobe of right lung ([place]) 07/08/2020  .  Brain metastasis ([place]) 06/22/2020  . Vasogenic brain edema ([place]) 06/21/2020  . Hyperlipidemia 01/02/2016  . Hypertriglyceridemia 01/02/2016  . Tobacco abuse 01/02/2016  . Type 2 diabetes mellitus without complication, without long-term current use of insulin ([place]) 01/02/2016  . Obesity 01/02/2016  . S/P AKA (above knee amputation) unilateral ([place]) 01/02/2016  . Amaurosis fugax 01/01/2016  . TIA (transient ischemic attack) 01/01/2016   PCP:   Juluis Pitch, MD Pharmacy:   [id number] [place], [address] [place] [address] Phone: [phone] Fax: [phone]     Social Determinants of Health (SDOH) Interventions    Readmission Risk Interventions No flowsheet data found.

## 2020-11-09 NOTE — Consult Note (Signed)
Tyler Gallagher , MD [address], [place], [place], [place], [address] [address], [place], [place], [place], [address] Phone: [phone]  Fax: [phone]  Consultation  Referring Provider:    [name]  primary Care Physician:  Juluis Pitch, MD Primary Gastroenterologist: None         Reason for Consultation:     Nausea vomiting  Date of Admission:  11/08/2020 Date of Consultation:  11/09/2020         HPI:   KURK CORNIEL is a 59 y.o. male with a history of metastatic lung cancer stage IV undergoing chemoradiation systolic heart failure with an EF of 30 to 35%.  Admitted to hospital with worsening nausea and vomiting ongoing for over 2 weeks.  I have been asked to see him for persistent nausea and vomiting.  CT chest abdomen and pelvis with contrast performed on 11/08/2020 shows no abdominal/pelvic metastatic adenopathy.  No bowel obstruction.  MRI brain without contrast demonstrates parietal lobe nodule consistent with metastatic disease.  Status post left frontal lobe resection site.  Hemoglobin 11 g white cell count 1.9.  Potassium 3.1 creatinine 0.5  Today he says he has no abdominal pain/vomiting/dysphagia or odynophagia. Last episode of vomiting was day before yesterday   Past Medical History:  Diagnosis Date  . Diabetes mellitus without complication ([place])   . Hyperlipidemia   . Hypertension   . Lesion of brain   . Lung cancer ([place])   . Mass of lung     Past Surgical History:  Procedure Laterality Date  . 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 COLONOSCOPY    . CYSTOSCOPY W/ RETROGRADES Bilateral 07/14/2020   Procedure: CYSTOSCOPY WITH RETROGRADE PYELOGRAM;  Surgeon: Hollice Espy, MD;  Location: [facility];  Service: Urology;  Laterality: Bilateral;  . PORTA CATH INSERTION N/A 08/03/2020   Procedure: PORTA CATH INSERTION;  Surgeon: Katha Cabal, MD;  Location: [facility];  Service: Cardiovascular;  Laterality: N/A;  . PORTA CATH INSERTION N/A 10/03/2020     Procedure: PORTA CATH INSERTION;  Surgeon: Algernon Huxley, MD;  Location: [facility];  Service: Cardiovascular;  Laterality: N/A;  . VIDEO BRONCHOSCOPY WITH ENDOBRONCHIAL NAVIGATION N/A 07/01/2020   Procedure: VIDEO BRONCHOSCOPY WITH ENDOBRONCHIAL NAVIGATION;  Surgeon: [name], MD;  Location: [facility];  Service: Thoracic;  Laterality: N/A;  . VIDEO BRONCHOSCOPY WITH ENDOBRONCHIAL ULTRASOUND N/A 07/01/2020   Procedure: VIDEO BRONCHOSCOPY WITH ENDOBRONCHIAL ULTRASOUND;  Surgeon: [name], MD;  Location: [facility];  Service: Thoracic;  Laterality: N/A;    Prior to Admission medications   Medication Sig Start Date End Date Taking? Authorizing Provider  [facility] SENNA PLUS 8.6-50 MG tablet Take 2 tablets by mouth at bedtime as needed for mild constipation.  Patient not taking: Reported on 11/08/2020 07/20/20   [provider]  ivabradine (CORLANOR) 5 MG TABS tablet Take 1 tablet (5 mg total) by mouth 2 (two) times daily with a meal. Patient not taking: Reported on 11/08/2020 10/31/20   Kate Sable, MD  lidocaine-prilocaine (EMLA) cream Apply to affected area once Patient not taking: Reported on 11/08/2020 07/26/20   [name], MD  loperamide (IMODIUM) 2 MG capsule Take 2 mg by mouth as needed for diarrhea or loose stools.  Patient not taking: Reported on 11/08/2020    [provider]  LORazepam (ATIVAN) 0.5 MG tablet Take 1 tablet (0.5 mg total) by mouth every 8 (eight) hours as needed for anxiety. Patient not taking:  Reported on 11/08/2020 10/17/20   [name], MD  OLANZapine (ZYPREXA) 10 MG tablet Take 1 tablet (10 mg total) by mouth at bedtime. Patient not taking: Reported on 11/08/2020 10/13/20   [name], MD  ondansetron (ZOFRAN) 4 MG tablet Take 4 mg by mouth every 8 (eight) hours as needed.  Patient not taking: Reported on 11/08/2020 07/20/20   [provider]  ondansetron (ZOFRAN) 8 MG tablet Take 1 tablet (8 mg total) by mouth 2 (two)  times daily as needed for refractory nausea / vomiting. Start on day 3 after chemo. Patient not taking: Reported on 11/08/2020 07/26/20   [name], MD  oxyCODONE (OXY IR/ROXICODONE) 5 MG immediate release tablet Take 1 tablet (5 mg total) by mouth every 6 (six) hours as needed for severe pain. Patient not taking: Reported on 11/08/2020 07/26/20   [name], MD  potassium chloride SA (KLOR-CON) 20 MEQ tablet Take 1 tablet (20 mEq total) by mouth 2 (two) times daily. Patient not taking: Reported on 11/08/2020 10/27/20   [name], MD  prochlorperazine (COMPAZINE) 10 MG tablet Take 1 tablet (10 mg total) by mouth every 6 (six) hours as needed (Nausea or vomiting). Patient not taking: Reported on 11/08/2020 07/26/20   [name], MD  triamcinolone ointment (KENALOG) 0.5 % Apply 1 application topically 2 (two) times daily. Patient not taking: Reported on 11/08/2020 10/06/20   Jacquelin Hawking, NP  vitamin B-12 (CYANOCOBALAMIN) 1000 MCG tablet Take 1,000 mcg by mouth daily. Patient not taking: Reported on 11/08/2020    [provider]    Family History  Problem Relation Age of Onset  . Diabetes Sister   . Diabetes Brother      Social History   Tobacco Use  . Smoking status: Former Smoker    Packs/day: 2.00    Years: 32.00    Pack years: 64.00    Types: Cigarettes    Quit date: 06/21/2020    Years since quitting: 0.3  . Smokeless tobacco: Never Used  Vaping Use  . Vaping Use: Never used  Substance Use Topics  . Alcohol use: No    Alcohol/week: 0.0 standard drinks  . Drug use: Not Currently    Types: Marijuana    Allergies as of 11/08/2020 - Review Complete 11/08/2020  Allergen Reaction Noted  . Penicillins Other (See Comments) 02/07/2017    Review of Systems:    All systems reviewed and negative except where noted in HPI.   Physical Exam:  Vital signs in last 24 hours: Temp:  [97.4 F (36.3 C)-98.6 F (37 C)] 97.9 F (36.6 C) (11/10 0758) Pulse Rate:   [100-110] 104 (11/10 0758) Resp:  [15-20] 18 (11/10 0758) BP: (105-151)/(57-97) 143/97 (11/10 0758) SpO2:  [96 %-100 %] 97 % (11/10 0758) Last BM Date: 11/08/20 General:   Pleasant, cooperative in NAD Head:  Normocephalic and atraumatic. Lungs: Respirations even and unlabored. Lungs clear to auscultation bilaterally.   No wheezes, crackles, or rhonchi.  Heart:  Regular rate and rhythm;  Without murmur, clicks, rubs or gallops Abdomen:  Soft, nondistended, nontender. Normal bowel sounds. No appreciable masses or hepatomegaly.  No rebound or guarding.  Neurologic:  Alert and oriented x3;  grossly normal neurologically. Psych:  Alert and cooperative. Normal affect.  LAB RESULTS: Recent Labs    11/08/20 0432 11/09/20 0358  WBC 2.4* 1.9*  HGB 11.7* 11.0*  HCT 35.4* 33.7*  PLT 180 150   BMET Recent Labs  11/07/20 1341 11/08/20 0432 11/09/20 0358  NA 133* 136 138  K 3.0* 2.8* 3.1*  CL 94* 97* 101  CO2 26 22 26   GLUCOSE 93 97 68*  BUN <5* <5* <5*  CREATININE 0.72 0.52* 0.50*  CALCIUM 9.0 8.2* 8.2*   LFT Recent Labs    11/08/20 0432  PROT 6.5  ALBUMIN 3.1*  AST 12*  ALT 10  ALKPHOS 67  BILITOT 1.4*   PT/INR Recent Labs    11/08/20 0432  LABPROT 15.5*  INR 1.3*    STUDIES: MR Brain W and Wo Contrast  Result Date: 11/08/2020 CLINICAL DATA:  Vertigo. History of lung cancer and brain metastases EXAM: MRI HEAD WITHOUT AND WITH CONTRAST TECHNIQUE: Multiplanar, multiecho pulse sequences of the brain and surrounding structures were obtained without and with intravenous contrast. CONTRAST:  60mL GADAVIST GADOBUTROL 1 MMOL/ML IV SOLN COMPARISON:  08/11/2020 FINDINGS: Brain: Recent left parafalcine resection at the vertex with significantly diminished, essentially resolved, enhancement. Punctate foci of enhancement clustered in the superior right frontal lobe are resolved since prior but there are is a new area of similar size punctate enhancement clustered left parietal  cortex which are marked on 65:035 and adjacent slices. In retrospect there was a nodule of enhancement along the parasagittal left parietal cortex which has increased in size and now measures 5 mm, consistent with metastasis. Persistent and unchanged 2 mm focus of enhancement in the anterior and medial right temporal lobe on 18:66. No acute infarct. Foci of restricted diffusion in the left cerebellum on prior have resolved. No hemorrhage, hydrocephalus, or masslike finding. 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 Vascular: Normal flow voids and vascular enhancements Skull and upper cervical spine: Unremarkable left-sided craniotomy. Sinuses/Orbits: Retention cyst in the right maxillary sinus and partial left mastoid opacification with negative nasopharynx. IMPRESSION: 1. No acute finding. 2. Diminished/resolved enhancement at the left parafalcine frontal lobe resection site. 3. 5 mm left parietal nodule which has slightly increased from a 08/11/2020 scan, consistent with metastatic disease. 4. Unchanged 2 mm of focus of enhancement in the medial right temporal lobe, also most consistent with metastasis. 5. On prior there was clustered punctate enhancing foci in the right frontal lobe which have resolved, favor subacute infarcts. Today there is a similar cluster of tiny nodular enhancement along the left frontal parietal cortex, which may also be subacute infarcts. Electronically Signed   By: Monte Fantasia M.D.   On: 11/08/2020 07:52   CT CHEST ABDOMEN PELVIS W CONTRAST  Result Date: 11/08/2020 CLINICAL DATA:  Metastatic small cell lung cancer. EXAM: CT CHEST, ABDOMEN, AND PELVIS WITH CONTRAST TECHNIQUE: Multidetector CT imaging of the chest, abdomen and pelvis was performed following the standard protocol during bolus administration of intravenous contrast. CONTRAST:  169mL OMNIPAQUE IOHEXOL 300 MG/ML  SOLN COMPARISON:  None. FINDINGS: CT CHEST FINDINGS Cardiovascular: The heart is  normal in size. No pericardial effusion. 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 No dissection. The branch vessels are patent. Stable coronary artery calcifications. The pulmonary arteries are unremarkable. Mediastinum/Nodes: No mediastinal or hilar mass or lymphadenopathy. The esophagus is grossly normal. Lungs/Pleura: Interval decrease in size of the right lower lobe pulmonary mass. It measures approximately 2.3 x 2.0 cm on image 103/4. It previously measured a maximum of 3.8 x 3.5 cm. No pulmonary nodules to suggest pulmonary metastatic disease. No significant inflammatory or infectious changes. No pleural effusions. Stable emphysematous changes and areas of pulmonary  scarring. Musculoskeletal: The chest wall is unremarkable. No supraclavicular or axillary adenopathy. The thyroid gland appears normal. The bony thorax is intact. CT ABDOMEN PELVIS FINDINGS Hepatobiliary: Vague low-attenuation liver lesions highly suspicious for metastatic disease. 12.5 mm lesion is noted at the left hepatic dome in segment 2. 13 mm lesion at the hepatic dome in segment 8 on image 53/2. Other smaller right hepatic lesions are noted. No biliary dilatation. Layering gallstones are noted the gallbladder. The common bile duct is normal in caliber. Pancreas: No mass, inflammation or ductal dilatation. Spleen: Normal size. No focal lesions crash that normal size. Small low-attenuation lesion anteriorly is likely a benign cyst. Adrenals/Urinary Tract: The adrenals are normal. No evidence of metastatic disease. No worrisome renal lesions or hydronephrosis. The bladder is unremarkable. Stomach/Bowel: The stomach, duodenum, small bowel and colon are unremarkable. No acute inflammatory changes, mass lesions or obstructive findings. The terminal ileum is normal. The appendix is normal. Vascular/Lymphatic: Advanced atherosclerotic calcifications involving the aorta and iliac arteries  but no aneurysm. No mesenteric or retroperitoneal mass or adenopathy. Reproductive: The prostate gland and seminal vesicles are unremarkable. Other: No pelvic mass or adenopathy. No free pelvic fluid collections. No inguinal mass or adenopathy. No abdominal wall hernia or subcutaneous lesions. Musculoskeletal: No significant bony findings. No worrisome bone lesions to suggest metastatic disease. IMPRESSION: 1. Interval decrease in size of the right lower lobe pulmonary mass. No mediastinal or hilar adenopathy. 2. No findings for pulmonary metastatic disease. 3. Vague low-attenuation liver lesions highly suspicious for metastatic disease. 4. No findings for abdominal/pelvic metastatic adenopathy. 5. Stable emphysematous changes and pulmonary scarring. 6. Advanced atherosclerotic calcifications involving the aorta and iliac arteries. 7. Cholelithiasis. 8. Emphysema and aortic atherosclerosis. Aortic Atherosclerosis (ICD10-I70.0) and Emphysema (ICD10-J43.9). Electronically Signed   By: Marijo Sanes M.D.   On: 11/08/2020 11:30      Impression / Plan:   EUELL SCHIFF is a 59 y.o. y/o male with a history of metastatic lung cancer to the brain.  Undergoing chemoradiation.  History of systolic heart failure with an EF 30 to 35%.  Admitted with nausea vomiting for over a week.  Low potassium and chloride.  No bowel obstruction or gastric outlet obstruction seen on CT scan of the abdomen with contrast.  Lesion seen in the brain suggestive of metastatic disease on MRI brain performed yesterday.  I have been consulted for nausea and vomiting.  Impression: Since no obstructive lesions are seen.  Nausea vomiting could be either due to impaired mobility versus acid reflux versus central nausea and vomiting from metastatic disease.Presently has resolved   Plan 1.  With a low EF as well as metastatic disease the benefits of any endoscopic evaluation are outweighed by the risks.  The main indication for any endoscopy  procedure would be to rule out obstruction and we already know that he does not have any obstruction based on the CAT scan with contrast.  2.  He may have a degree of gastroparesis either due to diabetes or oxycodone use.  Let us empirically treat that.  Suggest to consider to commence on Reglan after checking his QT C.  If further data is required we could always consider an upper GI series and if still has symptoms at that point do EGD to r/o HSV or CMV esophagitis .Continue long term acid supression with PPI or H2 blocker   3.  Correct electrolytes.  Low-fat diet.  If unable to keep any food down consider [place] tube feeding.  I  have discussed the plan with Dr Janese Banks   I will sign off.  Please call me if any further GI concerns or questions.  We would like to thank you for the opportunity to participate in the care of Tyler Gallagher.   Thank you for involving me in the care of this patient.      LOS: 0 days   Tyler Bellows, MD  11/09/2020, 10:24 AM

## 2020-11-09 NOTE — Evaluation (Signed)
Physical Therapy Evaluation Patient Details Name: Tyler Gallagher MRN: [id number] DOB: 12-09-1961 Today's Date: 11/09/2020   History of Present Illness  Tyler Gallagher is a 59 y.o. male with medical history significant of hypertension, hyperlipidemia, diabetes mellitus, TIA, anxiety, lung cancer with brain metastases, left AKA, chemotherapy-induced neutropenia, former smoker, who presents with dizziness. Pt states that he started feeling dizzy this morning, also has feeling of room spinning. She has nausea, vomiting and diarrhea.  Patient states that he vomited 3 times with nonbilious nonbloody vomiting. He has 2 loose stool bowel movement.  No abdominal pain.  He has generalized weakness, but no unilateral numbness or tingling in his extremities.  No vision loss or hearing loss.  No facial droop.  Patient has mild dry cough and mild shortness of breath chronically, which has not changed.  No chest pain, fever or chills.  No symptoms of UTI. Nothing in particular, just "any movement" makes the symptoms better or worse.  Initially patient had hypotension, normalized with saline in ED.  Clinical Impression  Pt was able to do all he needed regarding mobility and transferred to the recliner w/o issue.  He has very good UE strength, R LE grossly 3+/5 with reports of more weakness in recent weeks (in addition of much less activity, aka staying in bed).  He has a prosthetic from that he has not used in years, reports good general independence with w/c but girlfriend is available most of the time to assist.  Pt reports he has had elevated HR since starting cancer treatments, HR was 120s with very modest activity this date.  No c/o dizziness during PT session and minimal activity/head turning challenges, reports that there is not specific position or activities that seem to be a consistent cause. Discussed HHPT as he has been weaker and less active recently, he is safe to go home w/o it but would benefit from  this.  Pt to think on it.     Follow Up Recommendations Home health PT;Supervision - Intermittent (per pt progress and preference )    Equipment Recommendations  None recommended by PT    Recommendations for Other Services       Precautions / Restrictions Precautions Precautions: None Restrictions Weight Bearing Restrictions: No      Mobility  Bed Mobility Overal bed mobility: Modified Independent                  Transfers Overall transfer level: Modified independent Equipment used: None             General transfer comment: Pt performs SPT from seated surface to recliner, w/c not present.  No hesitancy or safety issues, good confidence  Ambulation/Gait             General Gait Details: non-ambulatory at baseline  Stairs            Wheelchair Mobility    Modified Rankin (Stroke Patients Only)       Balance Overall balance assessment: Needs assistance Sitting-balance support: No upper extremity supported;Feet supported Sitting balance-Leahy Scale: Normal         Standing balance comment: L AKA, does not use RW. Transfers sitting<>sitting only                             Pertinent Vitals/Pain Pain Assessment:  (vague chronic pain, nothing focal)    Home Living Family/patient expects to be discharged to:: Private residence Living Arrangements:  Spouse/significant other Available Help at Discharge: Family;Available 24 hours/day;Available PRN/intermittently Type of Home: Mobile home Home Access: Ramped entrance     Home Layout: One level Home Equipment: Wheelchair - manual;Wheelchair - power      Prior Function Level of Independence: Independent with assistive device(s)         Comments: Manual WC as primary mobility, completes SPT to/from all surfaces w/o AD, Mod I. Drives. Retired. Uses motorized w/c out side the home     Hand Dominance        Extremity/Trunk Assessment   Upper Extremity  Assessment Upper Extremity Assessment: Overall WFL for tasks assessed    Lower Extremity Assessment Lower Extremity Assessment: Generalized weakness (R grossly functional but generally weak) LLE Deficits / Details: AKA, L LE       Communication   Communication: No difficulties  Cognition Arousal/Alertness: Awake/alert Behavior During Therapy: WFL for tasks assessed/performed Overall Cognitive Status: Within Functional Limits for tasks assessed                                        General Comments General comments (skin integrity, edema, etc.): Pt reports he has been lower energy and activity recently, but still confident with sitting/transfers, no dizziness t/o PT exam    Exercises     Assessment/Plan    PT Assessment Patient needs continued PT services  PT Problem List Decreased strength;Decreased activity tolerance;Decreased mobility;Decreased balance;Decreased safety awareness;Cardiopulmonary status limiting activity       PT Treatment Interventions Functional mobility training;Therapeutic activities;Therapeutic exercise;Balance training;Neuromuscular re-education;Patient/family education;Wheelchair mobility training    PT Goals (Current goals can be found in the Care Plan section)  Acute Rehab PT Goals Patient Stated Goal: to feel better PT Goal Formulation: With patient Time For Goal Achievement: 11/23/20 Potential to Achieve Goals: Good    Frequency Min 2X/week   Barriers to discharge        Co-evaluation               AM-PAC PT "6 Clicks" Mobility  Outcome Measure Help needed turning from your back to your side while in a flat bed without using bedrails?: None Help needed moving from lying on your back to sitting on the side of a flat bed without using bedrails?: None Help needed moving to and from a bed to a chair (including a wheelchair)?: None Help needed standing up from a chair using your arms (e.g., wheelchair or bedside  chair)?: A Lot Help needed to walk in hospital room?: Total Help needed climbing 3-5 steps with a railing? : Total 6 Click Score: 16    End of Session   Activity Tolerance: Patient tolerated treatment well Patient left: with bed alarm set;with call bell/phone within reach   PT Visit Diagnosis: Muscle weakness (generalized) (M62.81);Dizziness and giddiness (R42)    Time: 8882-8003 PT Time Calculation (min) (ACUTE ONLY): 25 min   Charges:   PT Evaluation $PT Eval Low Complexity: 1 Low          Kreg Shropshire, DPT 11/09/2020, 9:21 AM

## 2020-11-09 NOTE — Progress Notes (Addendum)
PROGRESS NOTE    Tyler Gallagher  ZOX:[id number] DOB: October 20, 1961 DOA: 11/08/2020 PCP: Juluis Pitch, MD   Brief Narrative:  Tyler Gallagher is a 59 y.o. male with medical history significant of hypertension, hyperlipidemia, diabetes mellitus, TIA, anxiety, lung cancer with brain metastases, left AKA, chemotherapy-induced neutropenia, former smoker, who presents with dizziness. Initially patient had hypotension with blood pressure 89/79, which improved to 130/85 after giving 1 L normal saline in ED.  ED course: pt was found to have WBC 2.4, INR 1.3, negative Covid PCR, alcohol level less than 10, potassium 2.8, renal function okay, magnesium 1.3, temperature 97.5, tachycardia with heart rate of 128, 99, RR 23, 18, oxygen saturation 92-97% on room air, [name] of oncology is consulted by EDP  Assessment & Plan:  Dizziness/lightheadedness: -Secondary to orthostatic hypotension secondary to underlying dehydration.  MRI brain did not show any acute changes. -Continue as needed meclizine. -Continue with gentle hydration.  Decrease IV fluid rate considering underlying systolic CHF with low ejection fraction. -Evaluated by PT recommended home health with PT -On fall precautions.  Nausea, vomiting and diarrhea: -Continue with IV hydration. -C. difficile antigen is positive however toxin is negative. -Appreciate GI input-no intervention at this time.  Continue long-term acid suppression with PPI/H2 blockers.  Consider Reglan after checking QTC.-GI signed off. -Discussed with GI Dr. Melvia Heaps to start on treatment if patient has diarrhea.  We will start patient on p.o. vancomycin since he continues to have diarrhea.  Metastatic lung cancer: Mets in brain.  Status post surgery, chemo and radiation therapy. -Followed by oncology [name]  Appreciate input. -Reviewed CT chest/abdomen/pelvis  Hypomagnesemia: Replenished  Hypokalemia: Replenished.  Repeat BMP tomorrow a.m.  Type 2  diabetes mellitus without complications without long-term current use of insulin: A1c: 7.7%. -Start patient on sliding scale insulin  Chronic systolic CHF: Patient appears euvolemic on exam.  -Echo from 10/21 showed ejection fraction of 30 to 35%. -Continue with gentle hydration and monitor signs of fluid overload. -Strict INO's and daily weight.  Monitor electrolytes.  Hypotension: Likely in the setting of dehydration. -Improved with IV fluids.  Continue with gentle hydration-decrease IV fluid rate.  Monitor blood pressure closely.  History of TIA: Not on aspirin or statin.  Follow-up with PCP outpatient  Hyperlipidemia: Not on any medications. -Follow-up with PCP outpatient  Anxiety: Continue home Ativan.  Left AKA: Stable  DVT prophylaxis: Lovenox Code Status: Full code Family Communication: None present at bedside.  Plan of care discussed with patient in length and he verbalized understanding and agreed with it. Disposition Plan: Home with HH and PT in 1 to 2 days  Consultants:   GI  Oncology  Procedures:   None  Antimicrobials:   None  Status is: Observation   Dispo: The patient is from: Home              Anticipated d/c is to: Home with home health PT              Anticipated d/c date is: 11/11/2020              Patient currently not medically stable for the discharge.   Subjective: Patient seen and examined.  Sitting comfortably in the chair.  Tells me that he feels much better this morning.  His nausea, vomiting and diarrhea have improved.  Chest pain, shortness of breath, lightheadedness or dizziness.  Objective: Vitals:   11/08/20 2347 11/09/20 0533 11/09/20 0758 11/09/20 1130  BP: 112/83 (!) 128/93 Marland Kitchen)  143/97 (!) 138/97  Pulse: (!) 110 (!) 110 (!) 104 (!) 108  Resp: 17 20 18 20   Temp: 98.6 F (37 C) (!) 97.4 F (36.3 C) 97.9 F (36.6 C) (!) 97.4 F (36.3 C)  TempSrc: Oral Oral Oral   SpO2: 99% 99% 97% 98%  Weight:      Height:         Intake/Output Summary (Last 24 hours) at 11/09/2020 1328 Last data filed at 11/09/2020 0327 Gross per 24 hour  Intake 380.64 ml  Output 400 ml  Net -19.36 ml   Filed Weights   11/08/20 0425  Weight: 95.3 kg    Examination:  General exam: Appears calm and comfortable, communicating well, on room air, appears dehydrated. Respiratory system: Clear to auscultation. Respiratory effort normal. Cardiovascular system: S1 & S2 heard, RRR. No JVD, murmurs, rubs, gallops or clicks. No pedal edema. Gastrointestinal system: Abdomen is nondistended, soft and nontender. No organomegaly or masses felt. Normal bowel sounds heard. Central nervous system: Alert and oriented. No focal neurological deficits. Extremities: Symmetric 5 x 5 power.  Left AKA Skin: No rashes, lesions or ulcers Psychiatry: Judgement and insight appear normal. Mood & affect appropriate.    Data Reviewed: I have personally reviewed following labs and imaging studies  CBC: Recent Labs  Lab 11/08/20 0432 11/09/20 0358  WBC 2.4* 1.9*  NEUTROABS 1.4*  --   HGB 11.7* 11.0*  HCT 35.4* 33.7*  MCV 93.4 94.7  PLT 180 416   Basic Metabolic Panel: Recent Labs  Lab 11/07/20 1341 11/08/20 0432 11/09/20 0358  NA 133* 136 138  K 3.0* 2.8* 3.1*  CL 94* 97* 101  CO2 26 22 26   GLUCOSE 93 97 68*  BUN <5* <5* <5*  CREATININE 0.72 0.52* 0.50*  CALCIUM 9.0 8.2* 8.2*  MG 1.3* 1.7 1.7   GFR: Estimated Creatinine Clearance: 120.3 mL/min (A) (by C-G formula based on SCr of 0.5 mg/dL (L)). Liver Function Tests: Recent Labs  Lab 11/07/20 1341 11/08/20 0432  AST 12* 12*  ALT 10 10  ALKPHOS 72 67  BILITOT 1.3* 1.4*  PROT 7.1 6.5  ALBUMIN 3.4* 3.1*   Recent Labs  Lab 11/08/20 0432  LIPASE 32   No results for input(s): AMMONIA in the last 168 hours. Coagulation Profile: Recent Labs  Lab 11/08/20 0432  INR 1.3*   Cardiac Enzymes: No results for input(s): CKTOTAL, CKMB, CKMBINDEX, TROPONINI in the last 168  hours. BNP (last 3 results) No results for input(s): PROBNP in the last 8760 hours. HbA1C: No results for input(s): HGBA1C in the last 72 hours. CBG: Recent Labs  Lab 11/08/20 1612 11/09/20 0757  GLUCAP 69* 62*   Lipid Profile: No results for input(s): CHOL, HDL, LDLCALC, TRIG, CHOLHDL, LDLDIRECT in the last 72 hours. Thyroid Function Tests: No results for input(s): TSH, T4TOTAL, FREET4, T3FREE, THYROIDAB in the last 72 hours. Anemia Panel: No results for input(s): VITAMINB12, FOLATE, FERRITIN, TIBC, IRON, RETICCTPCT in the last 72 hours. Sepsis Labs: No results for input(s): PROCALCITON, LATICACIDVEN in the last 168 hours.  Recent Results (from the past 240 hour(s))  Respiratory Panel by RT PCR (Flu A&B, Covid) - Nasopharyngeal Swab     Status: None   Collection Time: 11/08/20  6:28 AM   Specimen: Nasopharyngeal Swab  Result Value Ref Range Status   SARS Coronavirus 2 by RT PCR NEGATIVE NEGATIVE Final    Comment: (NOTE) SARS-CoV-2 target nucleic acids are NOT DETECTED.  The SARS-CoV-2 RNA is generally detectable in  upper respiratoy specimens during the acute phase of infection. The lowest concentration of SARS-CoV-2 viral copies this assay can detect is 131 copies/mL. A negative result does not preclude SARS-Cov-2 infection and should not be used as the sole basis for treatment or other patient management decisions. A negative result may occur with  improper specimen collection/handling, submission of specimen other than nasopharyngeal swab, presence of viral mutation(s) within the areas targeted by this assay, and inadequate number of viral copies (<131 copies/mL). A negative result must be combined with clinical observations, patient history, and epidemiological information. The expected result is Negative.  Fact Sheet for Patients:  PinkCheek.be  Fact Sheet for Healthcare Providers:  GravelBags.it  This test  is no t yet approved or cleared by the [place] FDA and  has been authorized for detection and/or diagnosis of SARS-CoV-2 by FDA under an Emergency Use Authorization (EUA). This EUA will remain  in effect (meaning this test can be used) for the duration of the COVID-19 declaration under Section 564(b)(1) of the Act, 21 U.S.C. section 360bbb-3(b)(1), unless the authorization is terminated or revoked sooner.     Influenza A by PCR NEGATIVE NEGATIVE Final   Influenza B by PCR NEGATIVE NEGATIVE Final    Comment: (NOTE) The Xpert Xpress SARS-CoV-2/FLU/RSV assay is intended as an aid in  the diagnosis of influenza from Nasopharyngeal swab specimens and  should not be used as a sole basis for treatment. Nasal washings and  aspirates are unacceptable for Xpert Xpress SARS-CoV-2/FLU/RSV  testing.  Fact Sheet for Patients: PinkCheek.be  Fact Sheet for Healthcare Providers: GravelBags.it  This test is not yet approved or cleared by the [place] FDA and  has been authorized for detection and/or diagnosis of SARS-CoV-2 by  FDA under an Emergency Use Authorization (EUA). This EUA will remain  in effect (meaning this test can be used) for the duration of the  Covid-19 declaration under Section 564(b)(1) of the Act, 21  U.S.C. section 360bbb-3(b)(1), unless the authorization is  terminated or revoked. Performed at [facility], [place], [place] [address]   C Difficile Quick Screen w PCR reflex     Status: Abnormal   Collection Time: 11/08/20 11:38 AM   Specimen: STOOL  Result Value Ref Range Status   C Diff antigen POSITIVE (A) NEGATIVE Final   C Diff toxin NEGATIVE NEGATIVE Final   C Diff interpretation Results are indeterminate. See PCR results.  Final    Comment: Performed at [facility], [address], [place],  [address]      Radiology Studies: MR Brain W and Wo  Contrast  Result Date: 11/08/2020 CLINICAL DATA:  Vertigo. History of lung cancer and brain metastases EXAM: MRI HEAD WITHOUT AND WITH CONTRAST TECHNIQUE: Multiplanar, multiecho pulse sequences of the brain and surrounding structures were obtained without and with intravenous contrast. CONTRAST:  16mL GADAVIST GADOBUTROL 1 MMOL/ML IV SOLN COMPARISON:  08/11/2020 FINDINGS: Brain: Recent left parafalcine resection at the vertex with significantly diminished, essentially resolved, enhancement. Punctate foci of enhancement clustered in the superior right frontal lobe are resolved since prior but there are is a new area of similar size punctate enhancement clustered left parietal cortex which are marked on 56:213 and adjacent slices. In retrospect there was a nodule of enhancement along the parasagittal left parietal cortex which has increased in size and now measures 5 mm, consistent with metastasis. Persistent and unchanged 2 mm focus of enhancement in the anterior and medial right temporal lobe  on 18:66. No acute infarct. Foci of restricted diffusion in the left cerebellum on prior have resolved. No hemorrhage, hydrocephalus, or masslike finding. 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 Vascular: Normal flow voids and vascular enhancements Skull and upper cervical spine: Unremarkable left-sided craniotomy. Sinuses/Orbits: Retention cyst in the right maxillary sinus and partial left mastoid opacification with negative nasopharynx. IMPRESSION: 1. No acute finding. 2. Diminished/resolved enhancement at the left parafalcine frontal lobe resection site. 3. 5 mm left parietal nodule which has slightly increased from a 08/11/2020 scan, consistent with metastatic disease. 4. Unchanged 2 mm of focus of enhancement in the medial right temporal lobe, also most consistent with metastasis. 5. On prior there was clustered punctate enhancing foci in the right frontal lobe which have resolved, favor subacute  infarcts. Today there is a similar cluster of tiny nodular enhancement along the left frontal parietal cortex, which may also be subacute infarcts. Electronically Signed   By: Monte Fantasia M.D.   On: 11/08/2020 07:52   CT CHEST ABDOMEN PELVIS W CONTRAST  Result Date: 11/08/2020 CLINICAL DATA:  Metastatic small cell lung cancer. EXAM: CT CHEST, ABDOMEN, AND PELVIS WITH CONTRAST TECHNIQUE: Multidetector CT imaging of the chest, abdomen and pelvis was performed following the standard protocol during bolus administration of intravenous contrast. CONTRAST:  166mL OMNIPAQUE IOHEXOL 300 MG/ML  SOLN COMPARISON:  None. FINDINGS: CT CHEST FINDINGS Cardiovascular: The heart is normal in size. No pericardial effusion. 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 No dissection. The branch vessels are patent. Stable coronary artery calcifications. The pulmonary arteries are unremarkable. Mediastinum/Nodes: No mediastinal or hilar mass or lymphadenopathy. The esophagus is grossly normal. Lungs/Pleura: Interval decrease in size of the right lower lobe pulmonary mass. It measures approximately 2.3 x 2.0 cm on image 103/4. It previously measured a maximum of 3.8 x 3.5 cm. No pulmonary nodules to suggest pulmonary metastatic disease. No significant inflammatory or infectious changes. No pleural effusions. Stable emphysematous changes and areas of pulmonary scarring. Musculoskeletal: The chest wall is unremarkable. No supraclavicular or axillary adenopathy. The thyroid gland appears normal. The bony thorax is intact. CT ABDOMEN PELVIS FINDINGS Hepatobiliary: Vague low-attenuation liver lesions highly suspicious for metastatic disease. 12.5 mm lesion is noted at the left hepatic dome in segment 2. 13 mm lesion at the hepatic dome in segment 8 on image 53/2. Other smaller right hepatic lesions are noted. No biliary dilatation. Layering gallstones are noted the gallbladder. The  common bile duct is normal in caliber. Pancreas: No mass, inflammation or ductal dilatation. Spleen: Normal size. No focal lesions crash that normal size. Small low-attenuation lesion anteriorly is likely a benign cyst. Adrenals/Urinary Tract: The adrenals are normal. No evidence of metastatic disease. No worrisome renal lesions or hydronephrosis. The bladder is unremarkable. Stomach/Bowel: The stomach, duodenum, small bowel and colon are unremarkable. No acute inflammatory changes, mass lesions or obstructive findings. The terminal ileum is normal. The appendix is normal. Vascular/Lymphatic: Advanced atherosclerotic calcifications involving the aorta and iliac arteries but no aneurysm. No mesenteric or retroperitoneal mass or adenopathy. Reproductive: The prostate gland and seminal vesicles are unremarkable. Other: No pelvic mass or adenopathy. No free pelvic fluid collections. No inguinal mass or adenopathy. No abdominal wall hernia or subcutaneous lesions. Musculoskeletal: No significant bony findings. No worrisome bone lesions to suggest metastatic disease. IMPRESSION: 1. Interval decrease in size of the right lower lobe pulmonary mass. No mediastinal or hilar adenopathy. 2. No findings for pulmonary  metastatic disease. 3. Vague low-attenuation liver lesions highly suspicious for metastatic disease. 4. No findings for abdominal/pelvic metastatic adenopathy. 5. Stable emphysematous changes and pulmonary scarring. 6. Advanced atherosclerotic calcifications involving the aorta and iliac arteries. 7. Cholelithiasis. 8. Emphysema and aortic atherosclerosis. Aortic Atherosclerosis (ICD10-I70.0) and Emphysema (ICD10-J43.9). Electronically Signed   By: Marijo Sanes M.D.   On: 11/08/2020 11:30    Scheduled Meds: . Chlorhexidine Gluconate Cloth  6 each Topical Daily  . enoxaparin (LOVENOX) injection  40 mg Subcutaneous Q24H  . potassium chloride  40 mEq Oral BID  . vitamin B-12  1,000 mcg Oral Daily   Continuous  Infusions: . sodium chloride 75 mL/hr at 11/09/20 0956  . [START ON 11/10/2020] famotidine (PEPCID) IV       LOS: 0 days   Time spent: 35 minutes   Miley Blanchett Loann Quill, MD [facility]  If 7PM-7AM, please contact night-coverage [email or web address] 11/09/2020, 1:28 PM

## 2020-11-09 NOTE — Progress Notes (Signed)
PCR reflex for C-diff collected and sent to Lab. Results pending. MD aware.   Thresa Ross, RN

## 2020-11-09 NOTE — Care Management Obs Status (Signed)
[place] NOTIFICATION   Patient Details  Name: SANJIV CASTORENA MRN: [id number] Date of Birth: 1961/05/26   Medicare Observation Status Notification Given:  Yes  Reviewed with patient at bedside.  He is currently laying in bed resting, and gave this RNCM permission to sign on his behalf.  Signed copy provided to patient    Beverly Sessions, RN 11/09/2020, 3:53 PM

## 2020-11-09 NOTE — Progress Notes (Signed)
   11/09/20 1929  Assess: MEWS Score  Temp 97.6 F (36.4 C)  BP (!) 134/91  Pulse Rate (!) 115  Resp 18  SpO2 100 %  Assess: MEWS Score  MEWS Temp 0  MEWS Systolic 0  MEWS Pulse 2  MEWS RR 0  MEWS LOC 0  MEWS Score 2  MEWS Score Color Yellow  Assess: if the MEWS score is Yellow or Red  Were vital signs taken at a resting state? Yes  Focused Assessment No change from prior assessment  Early Detection of Sepsis Score *See Row Information* Low  MEWS guidelines implemented *See Row Information* No, vital signs rechecked  Treat  MEWS Interventions Other (Comment) (will recheck)  Notify: Charge Nurse/RN  Name of Charge Nurse/RN Notified Janith Lima, RN  Date Charge Nurse/RN Notified 11/09/20  Time Charge Nurse/RN Notified 1956

## 2020-11-10 ENCOUNTER — Inpatient Hospital Stay: Payer: Medicare HMO

## 2020-11-10 ENCOUNTER — Inpatient Hospital Stay: Payer: Medicare HMO | Admitting: Oncology

## 2020-11-10 DIAGNOSIS — R42 Dizziness and giddiness: Secondary | ICD-10-CM | POA: Diagnosis not present

## 2020-11-10 LAB — BASIC METABOLIC PANEL
Anion gap: 11 (ref 5–15)
BUN: <5 mg/dL — ABNORMAL LOW (ref 6–20)
CO2: 25 mmol/L (ref 22–32)
Calcium: 8.3 mg/dL — ABNORMAL LOW (ref 8.9–10.3)
Chloride: 102 mmol/L (ref 98–111)
Creatinine, Ser: 0.45 mg/dL — ABNORMAL LOW (ref 0.61–1.24)
GFR, Estimated: >60 mL/min (ref >60)
Glucose, Bld: 64 mg/dL — ABNORMAL LOW (ref 70–99)
Potassium: 3.1 mmol/L — ABNORMAL LOW (ref 3.5–5.1)
Sodium: 138 mmol/L (ref 135–145)

## 2020-11-10 LAB — GLUCOSE, CAPILLARY: Glucose-Capillary: 90 mg/dL (ref 70–99)

## 2020-11-10 LAB — CBC
HCT: 33.9 % — ABNORMAL LOW (ref 39.0–52.0)
Hemoglobin: 11.4 g/dL — ABNORMAL LOW (ref 13.0–17.0)
MCH: 31.7 pg (ref 26.0–34.0)
MCHC: 33.6 g/dL (ref 30.0–36.0)
MCV: 94.2 fL (ref 80.0–100.0)
Platelets: 152 10*3/uL (ref 150–400)
RBC: 3.6 MIL/uL — ABNORMAL LOW (ref 4.22–5.81)
RDW: 18 % — ABNORMAL HIGH (ref 11.5–15.5)
WBC: 1.9 10*3/uL — ABNORMAL LOW (ref 4.0–10.5)
nRBC: 0 % (ref 0.0–0.2)

## 2020-11-10 LAB — MAGNESIUM: Magnesium: 1.5 mg/dL — ABNORMAL LOW (ref 1.7–2.4)

## 2020-11-10 MED ORDER — POTASSIUM CHLORIDE 20 MEQ PO PACK
40.0000 meq | PACK | Freq: Two times a day (BID) | ORAL | Status: DC
  Administered 2020-11-10: 40 meq via ORAL
  Filled 2020-11-10: qty 2

## 2020-11-10 MED ORDER — MECLIZINE HCL 25 MG PO TABS: 25.0000 mg | ORAL_TABLET | Freq: Three times a day (TID) | ORAL | 0 refills | Status: DC | PRN

## 2020-11-10 MED ORDER — ADULT MULTIVITAMIN W/MINERALS CH: 1.0000 | ORAL_TABLET | Freq: Every day | ORAL | Status: DC

## 2020-11-10 MED ORDER — VANCOMYCIN 50 MG/ML ORAL SOLUTION
125.0000 mg | Freq: Four times a day (QID) | ORAL | Status: DC
Start: 2020-11-10 — End: 2021-01-17

## 2020-11-10 MED ORDER — PANTOPRAZOLE SODIUM 40 MG PO TBEC: 40.0000 mg | DELAYED_RELEASE_TABLET | Freq: Every day | ORAL | 0 refills | Status: DC

## 2020-11-10 MED ORDER — BOOST / RESOURCE BREEZE PO LIQD CUSTOM: 1.0000 | Freq: Three times a day (TID) | ORAL | Status: DC

## 2020-11-10 MED ORDER — MAGNESIUM SULFATE 2 GM/50ML IV SOLN
2.0000 g | Freq: Once | INTRAVENOUS | Status: AC
  Administered 2020-11-10: 2 g via INTRAVENOUS
  Filled 2020-11-10: qty 50

## 2020-11-10 MED ORDER — METOPROLOL TARTRATE 25 MG PO TABS: 12.5000 mg | ORAL_TABLET | Freq: Two times a day (BID) | ORAL | 0 refills | Status: DC

## 2020-11-10 NOTE — Progress Notes (Signed)
   11/10/20 0902  Assess: MEWS Score  Temp 97.7 F (36.5 C)  BP (!) 133/105  Pulse Rate (!) 122  Resp 20  Level of Consciousness Alert  SpO2 98 %  O2 Device Room Air  Assess: MEWS Score  MEWS Temp 0  MEWS Systolic 0  MEWS Pulse 2  MEWS RR 0  MEWS LOC 0  MEWS Score 2  MEWS Score Color Yellow  Assess: if the MEWS score is Yellow or Red  Were vital signs taken at a resting state? Yes  Focused Assessment No change from prior assessment  Early Detection of Sepsis Score *See Row Information* Medium  MEWS guidelines implemented *See Row Information* Yes  Treat  MEWS Interventions Escalated (See documentation below)  Pain Scale 0-10  Pain Score 0  Take Vital Signs  Increase Vital Sign Frequency  Yellow: Q 2hr X 2 then Q 4hr X 2, if remains yellow, continue Q 4hrs  Escalate  MEWS: Escalate Yellow: discuss with charge nurse/RN and consider discussing with provider and RRT  Notify: Charge Nurse/RN  Name of Charge Nurse/RN Notified Annabella,RN  Date Charge Nurse/RN Notified 11/10/20  Time Charge Nurse/RN Notified 0920

## 2020-11-10 NOTE — Progress Notes (Signed)
Initial Nutrition Assessment  DOCUMENTATION CODES:   Not applicable  INTERVENTION:   Boost Breeze po TID, each supplement provides 250 kcal and 9 grams of protein  Magic cup TID with meals, each supplement provides 290 kcal and 9 grams of protein  MVI daily  Pt at high refeed risk; recommend monitor potassium, magnesium and phosphorus labs daily until stable  NUTRITION DIAGNOSIS:   Increased nutrient needs related to cancer and cancer related treatments as evidenced by increased estimated needs.  GOAL:   Patient will meet greater than or equal to 90% of their needs  MONITOR:   PO intake, Supplement acceptance, Labs, Weight trends, Skin, I & O's  REASON FOR ASSESSMENT:   Malnutrition Screening Tool    ASSESSMENT:   59 y.o. male with medical history significant of hypertension, hyperlipidemia, diabetes mellitus, CHF, TIA, anxiety, lung cancer with brain metastases, left AKA, chemotherapy-induced neutropenia, former smoker, who presents with dizziness.   Met with pt in room today. Pt reports poor appetite and oral intake for 3-4 weeks pta r/t chemotherapy treatments; pt reports nausea along with taste changes. Pt reports that he does not like Ensure or Boost supplements. Pt reports that he is only able to eat with plastic silverware. Per chart, pt is down 34lbs(15%) in < 4 months; this is significant. Pt reports that his UBW is ~260lbs. Pt continues to have poor appetite and oral intake in hospital. Pt reports continued nausea r/t being dizzy. Pt had not touched his breakfast tray today. RD discussed with pt the importance of adequate nutrition needed to preserve lean muscle. Recommended for pt to try and find a supplements that he can drink. Pt is willing to try Boost Breeze and Magic Cups in hospital. RD will add supplements and MVI to help pt meet his estimated needs. Pt is likely at refeed risk.   Medications reviewed and include: lovenox, KCl, vancomycin, B12, NaCl '@50ml' /hr,  pepcid  Labs reviewed: K 3.1(L), BUN <5(L), creat 0.45(L), Mg 1.5(L)  NUTRITION - FOCUSED PHYSICAL EXAM:    Most Recent Value  Orbital Region No depletion  Upper Arm Region Moderate depletion  Thoracic and Lumbar Region No depletion  Buccal Region No depletion  Temple Region No depletion  Clavicle Bone Region Mild depletion  Clavicle and Acromion Bone Region Mild depletion  Scapular Bone Region No depletion  Dorsal Hand No depletion  Patellar Region Mild depletion  Anterior Thigh Region Mild depletion  Posterior Calf Region Mild depletion  Edema (RD Assessment) None  Hair Reviewed  Eyes Reviewed  Mouth Reviewed  Skin Reviewed  Nails Reviewed     Diet Order:   Diet Order            Diet regular Room service appropriate? Yes; Fluid consistency: Thin  Diet effective now                EDUCATION NEEDS:   Education needs have been addressed  Skin:  Skin Assessment: Reviewed RN Assessment  Last BM:  11/10- Type 7  Height:   Ht Readings from Last 1 Encounters:  11/08/20 '6\' 3"'  (1.905 m)    Weight:   Wt Readings from Last 1 Encounters:  11/10/20 88.3 kg    Ideal Body Weight:  78.4 kg (adjusted for L AKA)  BMI:  Body mass index is 24.32 kg/m.  Estimated Nutritional Needs:   Kcal:  2400-2700kcal/day  Protein:  120-135g/day  Fluid:  2.7-3.0L/day  Koleen Distance MS, RD, LDN Please refer to [facility] for RD and/or  RD on-call/weekend/after hours pager

## 2020-11-10 NOTE — Discharge Summary (Addendum)
Physician Discharge Summary  TREYTEN Gallagher Gallagher:[id number] DOB: 05-May-1961 DOA: 11/08/2020  PCP: Juluis Pitch, MD  Admit date: 11/08/2020 Discharge date: 11/10/2020  Admitted From: home Disposition:  home  Recommendations for Outpatient Follow-up:  1. Follow-up with PCP in 1 week 2. Repeat CBC, BMP, magnesium level on follow-up visit 3. Follow-up with oncology as scheduled. 4. Encouraged to finish vancomycin course for 10 days as prescribed for C. difficile diarrhea.  Home Health: none  Equipment/Devices:none  Discharge Condition:stable  CODE STATUS: Full code Diet recommendation: Cardiac healthy diet  Brief/Interim Summary: Tyler Gallagher a 59 y.o.malewith medical history significant ofhypertension, hyperlipidemia, diabetes mellitus, TIA, anxiety, lung cancer with brain metastases, left AKA, chemotherapy-induced neutropenia, former smoker, who presents with dizziness. Initially patient had hypotension with blood pressure 89/79, which improved to 130/85 after giving 1 L normal saline in ED.  ED course:pt was found to have WBC 2.4, INR 1.3, negative Covid PCR, alcohol level less than 10, potassium 2.8, renal function okay, magnesium 1.3, temperature 97.5, tachycardia with heart rate of 128, 99, RR 23, 18, oxygen saturation 92-97% on room air, [name] of oncology is consulted by EDP  Dizziness/lightheadedness: -Secondary to  hypotension secondary to underlying dehydration.  MRI brain did not show any acute changes. -Patient started on IV fluids and meclizine.  His symptoms improved.  -Evaluated by PT recommended home health with PT-however patient refused.  C diff diarrhea: -C. difficile antigen is positive however toxin is negative. -Discussed with GI Dr. Melvia Heaps to start on treatment if patient has diarrhea.  Started patient on p.o. vancomycin since he continued to have diarrhea. -Appreciate GI input recommended no intervention at this time.  Continued  long-term acid suppression with PPI/H2 blockers & Consider Reglan after checking QTC.-GI signed off. -Discussed with pharmacy-we will arrange p.o. vancomycin for total of 10 days course before discharge from the hospital.  Metastatic lung cancer: Mets in brain.  Status post surgery, chemo and radiation therapy. -Followed by oncology [name]  Appreciate input. -Reviewed MRI brain, CT chest/abdomen/pelvis -Discussed with Dr. Ria Comment agreed with discharging patient home today with close follow-up outpatient.  Hypomagnesemia: Replenished  Hypokalemia: Replenished.   Type 2 diabetes mellitus without complications without long-term current use of insulin: A1c: 7.7%. -Started patient on sliding scale insulin -Not on any medications at home.  Does not want to take insulin at home.  He told the nurse that he has Metformin however does not want to take it either.  Chronic systolic CHF: Patient appears euvolemic on exam.  -Echo from 10/21 showed ejection fraction of 30 to 35%. -Continued with gentle hydration and monitored signs of fluid overload. -Strict Gallagher's and daily weight.  Monitored electrolytes.  Hypotension: Likely in the setting of dehydration. -Improved with IV fluids.  Sinus tachycardia: Likely in the setting of dehydration -Patient's heart rate improved with IV fluids.  H&H remained stable.  Denied chest pain, palpitation, shortness of breath, anxiety, stress or pain. -Discharged patient on low-dose metoprolol to help with heart rate. Not sure with his compliance.  Follow-up with PCP outpatient  History of TIA: Not on aspirin or statin.  Follow-up with PCP outpatient  Hyperlipidemia: Not on any medications. -Follow-up with PCP outpatient  Anxiety: Continued home Ativan.  Left AKA: Stable  Please note: Patient does not take any medications at home.  Advised to follow-up with PCP and discuss about his medications.  He has diabetes and he needs to be on antihyperglycemic  medication.  Discharge Diagnoses:  Dizziness/lightheadedness Nausea, vomiting  and diarrhea-C. difficile positive Metastatic lung cancer Hypomagnesemia Hypokalemia Type 2 diabetes mellitus Chronic systolic CHF Hypertension History of TIA Hyperlipidemia Anxiety Left AKA  Discharge Instructions  Discharge Instructions    Diet - low sodium heart healthy   Complete by: As directed    Discharge instructions   Complete by: As directed    Follow-up with PCP in 1 week Repeat CBC, BMP, magnesium level on follow-up visit Follow-up with oncology as scheduled. Encouraged to finish vancomycin course for 10 days as prescribed for C. difficile diarrhea. Meclizine as needed for lightheadedness/vertigo   Increase activity slowly   Complete by: As directed      Allergies as of 11/10/2020      Reactions   Penicillins Other (See Comments)   unknown      Medication List    STOP taking these medications   loperamide 2 MG capsule Commonly known as: IMODIUM   ondansetron 4 MG tablet Commonly known as: ZOFRAN   ondansetron 8 MG tablet Commonly known as: Zofran     TAKE these medications   [employer] Senna Plus 8.6-50 MG tablet Generic drug: senna-docusate Take 2 tablets by mouth at bedtime as needed for mild constipation.   ivabradine 5 MG Tabs tablet Commonly known as: Corlanor Take 1 tablet (5 mg total) by mouth 2 (two) times daily with a meal.   lidocaine-prilocaine cream Commonly known as: EMLA Apply to affected area once   LORazepam 0.5 MG tablet Commonly known as: ATIVAN Take 1 tablet (0.5 mg total) by mouth every 8 (eight) hours as needed for anxiety.   meclizine 25 MG tablet Commonly known as: ANTIVERT Take 1 tablet (25 mg total) by mouth 3 (three) times daily as needed for dizziness.   metoprolol tartrate 25 MG tablet Commonly known as: LOPRESSOR Take 0.5 tablets (12.5 mg total) by mouth 2 (two) times daily.   OLANZapine 10 MG tablet Commonly known as:  ZYPREXA TAKE 1 TABLET BY MOUTH EVERYDAY AT BEDTIME What changed: See the new instructions.   oxyCODONE 5 MG immediate release tablet Commonly known as: Oxy IR/ROXICODONE Take 1 tablet (5 mg total) by mouth every 6 (six) hours as needed for severe pain.   pantoprazole 40 MG tablet Commonly known as: Protonix Take 1 tablet (40 mg total) by mouth daily.   potassium chloride SA 20 MEQ tablet Commonly known as: KLOR-CON Take 1 tablet (20 mEq total) by mouth 2 (two) times daily.   prochlorperazine 10 MG tablet Commonly known as: COMPAZINE Take 1 tablet (10 mg total) by mouth every 6 (six) hours as needed (Nausea or vomiting).   triamcinolone ointment 0.5 % Commonly known as: KENALOG Apply 1 application topically 2 (two) times daily.   vancomycin 50 mg/mL  oral solution Commonly known as: VANCOCIN Take 2.5 mLs (125 mg total) by mouth 4 (four) times daily.   vitamin B-12 1000 MCG tablet Commonly known as: CYANOCOBALAMIN Take 1,000 mcg by mouth daily.       Allergies  Allergen Reactions  . Penicillins Other (See Comments)    unknown    Consultations:  GI  Procedures/Studies: MR Brain W and Wo Contrast  Result Date: 11/08/2020 CLINICAL DATA:  Vertigo. History of lung cancer and brain metastases EXAM: MRI HEAD WITHOUT AND WITH CONTRAST TECHNIQUE: Multiplanar, multiecho pulse sequences of the brain and surrounding structures were obtained without and with intravenous contrast. CONTRAST:  97mL GADAVIST GADOBUTROL 1 MMOL/ML IV SOLN COMPARISON:  08/11/2020 FINDINGS: Brain: Recent left parafalcine resection at the vertex with  significantly diminished, essentially resolved, enhancement. Punctate foci of enhancement clustered in the superior right frontal lobe are resolved since prior but there are is a new area of similar size punctate enhancement clustered left parietal cortex which are marked on 32:202 and adjacent slices. In retrospect there was a nodule of enhancement along the  parasagittal left parietal cortex which has increased in size and now measures 5 mm, consistent with metastasis. Persistent and unchanged 2 mm focus of enhancement in the anterior and medial right temporal lobe on 18:66. No acute infarct. Foci of restricted diffusion in the left cerebellum on prior have resolved. No hemorrhage, hydrocephalus, or masslike finding. 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 Vascular: Normal flow voids and vascular enhancements Skull and upper cervical spine: Unremarkable left-sided craniotomy. Sinuses/Orbits: Retention cyst in the right maxillary sinus and partial left mastoid opacification with negative nasopharynx. IMPRESSION: 1. No acute finding. 2. Diminished/resolved enhancement at the left parafalcine frontal lobe resection site. 3. 5 mm left parietal nodule which has slightly increased from a 08/11/2020 scan, consistent with metastatic disease. 4. Unchanged 2 mm of focus of enhancement in the medial right temporal lobe, also most consistent with metastasis. 5. On prior there was clustered punctate enhancing foci in the right frontal lobe which have resolved, favor subacute infarcts. Today there is a similar cluster of tiny nodular enhancement along the left frontal parietal cortex, which may also be subacute infarcts. Electronically Signed   By: Monte Fantasia M.D.   On: 11/08/2020 07:52   CT CHEST ABDOMEN PELVIS W CONTRAST  Result Date: 11/08/2020 CLINICAL DATA:  Metastatic small cell lung cancer. EXAM: CT CHEST, ABDOMEN, AND PELVIS WITH CONTRAST TECHNIQUE: Multidetector CT imaging of the chest, abdomen and pelvis was performed following the standard protocol during bolus administration of intravenous contrast. CONTRAST:  150mL OMNIPAQUE IOHEXOL 300 MG/ML  SOLN COMPARISON:  None. FINDINGS: CT CHEST FINDINGS Cardiovascular: The heart is normal in size. No pericardial effusion. 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 No dissection. The branch vessels are patent. Stable coronary artery calcifications. The pulmonary arteries are unremarkable. Mediastinum/Nodes: No mediastinal or hilar mass or lymphadenopathy. The esophagus is grossly normal. Lungs/Pleura: Interval decrease in size of the right lower lobe pulmonary mass. It measures approximately 2.3 x 2.0 cm on image 103/4. It previously measured a maximum of 3.8 x 3.5 cm. No pulmonary nodules to suggest pulmonary metastatic disease. No significant inflammatory or infectious changes. No pleural effusions. Stable emphysematous changes and areas of pulmonary scarring. Musculoskeletal: The chest wall is unremarkable. No supraclavicular or axillary adenopathy. The thyroid gland appears normal. The bony thorax is intact. CT ABDOMEN PELVIS FINDINGS Hepatobiliary: Vague low-attenuation liver lesions highly suspicious for metastatic disease. 12.5 mm lesion is noted at the left hepatic dome in segment 2. 13 mm lesion at the hepatic dome in segment 8 on image 53/2. Other smaller right hepatic lesions are noted. No biliary dilatation. Layering gallstones are noted the gallbladder. The common bile duct is normal in caliber. Pancreas: No mass, inflammation or ductal dilatation. Spleen: Normal size. No focal lesions crash that normal size. Small low-attenuation lesion anteriorly is likely a benign cyst. Adrenals/Urinary Tract: The adrenals are normal. No evidence of metastatic disease. No worrisome renal lesions or hydronephrosis. The bladder is unremarkable. Stomach/Bowel: The stomach, duodenum, small bowel and colon are unremarkable. No acute inflammatory changes, mass lesions or obstructive findings. The terminal ileum is normal. The appendix is normal. Vascular/Lymphatic:  Advanced atherosclerotic calcifications involving the aorta and iliac arteries but no aneurysm. No mesenteric or retroperitoneal mass or adenopathy. Reproductive: The prostate gland and seminal  vesicles are unremarkable. Other: No pelvic mass or adenopathy. No free pelvic fluid collections. No inguinal mass or adenopathy. No abdominal wall hernia or subcutaneous lesions. Musculoskeletal: No significant bony findings. No worrisome bone lesions to suggest metastatic disease. IMPRESSION: 1. Interval decrease in size of the right lower lobe pulmonary mass. No mediastinal or hilar adenopathy. 2. No findings for pulmonary metastatic disease. 3. Vague low-attenuation liver lesions highly suspicious for metastatic disease. 4. No findings for abdominal/pelvic metastatic adenopathy. 5. Stable emphysematous changes and pulmonary scarring. 6. Advanced atherosclerotic calcifications involving the aorta and iliac arteries. 7. Cholelithiasis. 8. Emphysema and aortic atherosclerosis. Aortic Atherosclerosis (ICD10-I70.0) and Emphysema (ICD10-J43.9). Electronically Signed   By: Marijo Sanes M.D.   On: 11/08/2020 11:30   ECHOCARDIOGRAM COMPLETE  Result Date: 10/26/2020    ECHOCARDIOGRAM REPORT   Patient Name:   Tyler Gallagher Date of Exam: 10/26/2020 Medical Rec #:  [id number]        Height:       75.0 in Accession #:    [id number]       Weight:       208.0 lb Date of Birth:  1961-08-27        BSA:          2.231 m Patient Age:    19 years         BP:           144/76 mmHg Patient Gender: M                HR:           97 bpm. Exam Location:  [facility] Procedure: 2D Echo, Cardiac Doppler, Color Doppler and Intracardiac            Opacification Agent Indications:     Palpitations 785.1  History:         Patient has prior history of Echocardiogram examinations, most                  recent 01/02/2016. Risk Factors:Hypertension, Dyslipidemia and                  Diabetes. Lung cancer.  Sonographer:     Sherrie Sport RDCS (AE) Referring Phys:  [id number] [facility] RAO Diagnosing Phys: Bartholome Bill MD  Sonographer Comments: Technically difficult study due to poor echo windows, no apical window and no subcostal window. IMPRESSIONS  1.  Left ventricular ejection fraction, by estimation, is 30 to 35%. The left ventricle has moderately decreased function. The left ventricle demonstrates regional wall motion abnormalities (see scoring diagram/findings for description). The left ventricular internal cavity size was mildly dilated. Left ventricular diastolic parameters were normal.  2. Right ventricular systolic function is normal. The right ventricular size is normal.  3. Left atrial size was mildly dilated.  4. The mitral valve was not well visualized. Trivial mitral valve regurgitation.  5. The aortic valve was not well visualized. Aortic valve regurgitation is not visualized. FINDINGS  Left Ventricle: Left ventricular ejection fraction, by estimation, is 30 to 35%. The left ventricle has moderately decreased function. The left ventricle demonstrates regional wall motion abnormalities. Definity contrast agent was given IV to delineate the left ventricular endocardial borders. The left ventricular internal cavity size was mildly dilated. There is no left ventricular hypertrophy. Left ventricular diastolic parameters were normal.  LV  Wall Scoring: The apical lateral segment, apical septal segment, and apex are akinetic. The antero-lateral wall, mid inferoseptal segment, and basal inferoseptal segment are hypokinetic. Right Ventricle: The right ventricular size is normal. No increase in right ventricular wall thickness. Right ventricular systolic function is normal. Left Atrium: Left atrial size was mildly dilated. Right Atrium: Right atrial size was normal in size. Pericardium: There is no evidence of pericardial effusion. Mitral Valve: The mitral valve was not well visualized. Trivial mitral valve regurgitation. Tricuspid Valve: The tricuspid valve is not well visualized. Tricuspid valve regurgitation is trivial. Aortic Valve: The aortic valve was not well visualized. Aortic valve regurgitation is not visualized. Pulmonic Valve: The pulmonic valve was  not well visualized. Pulmonic valve regurgitation is not visualized. Aorta: The aortic root is normal in size and structure. IAS/Shunts: The interatrial septum was not assessed.  LEFT VENTRICLE PLAX 2D LVIDd:         4.49 cm LVIDs:         3.77 cm LV PW:         1.11 cm LV IVS:        1.26 cm LVOT diam:     2.10 cm LVOT Area:     3.46 cm  LEFT ATRIUM         Index LA diam:    2.50 cm 1.12 cm/m                        PULMONIC VALVE AORTA                 PV Vmax:        0.47 m/s Ao Root diam: 3.30 cm PV Peak grad:   0.9 mmHg                       RVOT Peak grad: 1 mmHg   SHUNTS Systemic Diam: 2.10 cm Bartholome Bill MD Electronically signed by Bartholome Bill MD Signature Date/Time: 10/26/2020/2:57:45 PM    Final       Subjective: Patient seen and examined.  Sitting comfortably on the line.  Tells me that he feels better this morning.  His diarrhea has improved.  No new complaints.  Wishes to go home.  Refused home health/PT. tells me that he will be able to take care of himself at home.  Discharge Exam: Vitals:   11/10/20 1051 11/10/20 1315  BP: (!) 149/110 120/89  Pulse: (!) 113 (!) 115  Resp: 20 18  Temp: (!) 97.5 F (36.4 C) 97.6 F (36.4 C)  SpO2: 99% 99%   Vitals:   11/10/20 0902 11/10/20 1051 11/10/20 1214 11/10/20 1315  BP: (!) 133/105 (!) 149/110  120/89  Pulse: (!) 122 (!) 113  (!) 115  Resp: 20 20  18   Temp: 97.7 F (36.5 C) (!) 97.5 F (36.4 C)  97.6 F (36.4 C)  TempSrc: Oral Oral  Oral  SpO2: 98% 99%  99%  Weight:   88.3 kg   Height:        General: Pt is alert, awake, not in acute distress Cardiovascular: RRR, S1/S2 +, no rubs, no gallops Respiratory: CTA bilaterally, no wheezing, no rhonchi Abdominal: Soft, NT, ND, bowel sounds + Extremities: no edema, no cyanosis, left AKA    The results of significant diagnostics from this hospitalization (including imaging, microbiology, ancillary and laboratory) are listed below for reference.     Microbiology: Recent  Results (from the past 240 hour(s))  Respiratory Panel by RT PCR (Flu A&B, Covid) - Nasopharyngeal Swab     Status: None   Collection Time: 11/08/20  6:28 AM   Specimen: Nasopharyngeal Swab  Result Value Ref Range Status   SARS Coronavirus 2 by RT PCR NEGATIVE NEGATIVE Final    Comment: (NOTE) SARS-CoV-2 target nucleic acids are NOT DETECTED.  The SARS-CoV-2 RNA is generally detectable in upper respiratoy specimens during the acute phase of infection. The lowest concentration of SARS-CoV-2 viral copies this assay can detect is 131 copies/mL. A negative result does not preclude SARS-Cov-2 infection and should not be used as the sole basis for treatment or other patient management decisions. A negative result may occur with  improper specimen collection/handling, submission of specimen other than nasopharyngeal swab, presence of viral mutation(s) within the areas targeted by this assay, and inadequate number of viral copies (<131 copies/mL). A negative result must be combined with clinical observations, patient history, and epidemiological information. The expected result is Negative.  Fact Sheet for Patients:  PinkCheek.be  Fact Sheet for Healthcare Providers:  GravelBags.it  This test is no t yet approved or cleared by the [place] FDA and  has been authorized for detection and/or diagnosis of SARS-CoV-2 by FDA under an Emergency Use Authorization (EUA). This EUA will remain  in effect (meaning this test can be used) for the duration of the COVID-19 declaration under Section 564(b)(1) of the Act, 21 U.S.C. section 360bbb-3(b)(1), unless the authorization is terminated or revoked sooner.     Influenza A by PCR NEGATIVE NEGATIVE Final   Influenza B by PCR NEGATIVE NEGATIVE Final    Comment: (NOTE) The Xpert Xpress SARS-CoV-2/FLU/RSV assay is intended as an aid in  the diagnosis of influenza from Nasopharyngeal swab  specimens and  should not be used as a sole basis for treatment. Nasal washings and  aspirates are unacceptable for Xpert Xpress SARS-CoV-2/FLU/RSV  testing.  Fact Sheet for Patients: PinkCheek.be  Fact Sheet for Healthcare Providers: GravelBags.it  This test is not yet approved or cleared by the [place] FDA and  has been authorized for detection and/or diagnosis of SARS-CoV-2 by  FDA under an Emergency Use Authorization (EUA). This EUA will remain  in effect (meaning this test can be used) for the duration of the  Covid-19 declaration under Section 564(b)(1) of the Act, 21  U.S.C. section 360bbb-3(b)(1), unless the authorization is  terminated or revoked. Performed at [facility], [address] [place], [place] [address]   C Difficile Quick Screen w PCR reflex     Status: Abnormal   Collection Time: 11/08/20 11:38 AM   Specimen: STOOL  Result Value Ref Range Status   C Diff antigen POSITIVE (A) NEGATIVE Final   C Diff toxin NEGATIVE NEGATIVE Final    Comment: Performed at [facility], [address] [place], [place] [address]  C. Diff by PCR, Reflexed     Status: Abnormal   Collection Time: 11/09/20 11:38 AM  Result Value Ref Range Status   Toxigenic C. Difficile by PCR POSITIVE (A) NEGATIVE Final    Comment: Positive for toxigenic C. difficile with little to no toxin production. Only treat if clinical presentation suggests symptomatic illness. Performed at [facility], [address] [place], [place] [address]      Labs: BNP (last 3 results) Recent Labs    11/08/20 0432  BNP 23.5   Basic Metabolic Panel: Recent Labs  Lab 11/07/20 1341 11/08/20 0432 11/09/20 0358 11/10/20 0346  NA 133* 136 138 138  K 3.0* 2.8* 3.1* 3.1*  CL 94* 97* 101 102  CO2 26 22 26 25   GLUCOSE 93 97 68* 64*  BUN <5* <5* <5* <5*  CREATININE 0.72 0.52* 0.50* 0.45*  CALCIUM 9.0 8.2* 8.2*  8.3*  MG 1.3* 1.7 1.7 1.5*   Liver Function Tests: Recent Labs  Lab 11/07/20 1341 11/08/20 0432  AST 12* 12*  ALT 10 10  ALKPHOS 72 67  BILITOT 1.3* 1.4*  PROT 7.1 6.5  ALBUMIN 3.4* 3.1*   Recent Labs  Lab 11/08/20 0432  LIPASE 32   No results for input(s): AMMONIA in the last 168 hours. CBC: Recent Labs  Lab 11/08/20 0432 11/09/20 0358 11/10/20 0346  WBC 2.4* 1.9* 1.9*  NEUTROABS 1.4*  --   --   HGB 11.7* 11.0* 11.4*  HCT 35.4* 33.7* 33.9*  MCV 93.4 94.7 94.2  PLT 180 150 152   Cardiac Enzymes: No results for input(s): CKTOTAL, CKMB, CKMBINDEX, TROPONINI in the last 168 hours. BNP: Invalid input(s): POCBNP CBG: Recent Labs  Lab 11/08/20 1612 11/09/20 0757 11/10/20 0754  GLUCAP 69* 62* 90   D-Dimer No results for input(s): DDIMER in the last 72 hours. Hgb A1c No results for input(s): HGBA1C in the last 72 hours. Lipid Profile No results for input(s): CHOL, HDL, LDLCALC, TRIG, CHOLHDL, LDLDIRECT in the last 72 hours. Thyroid function studies No results for input(s): TSH, T4TOTAL, T3FREE, THYROIDAB in the last 72 hours.  Invalid input(s): FREET3 Anemia work up No results for input(s): VITAMINB12, FOLATE, FERRITIN, TIBC, IRON, RETICCTPCT in the last 72 hours. Urinalysis    Component Value Date/Time   COLORURINE STRAW (A) 06/15/2020 1354   APPEARANCEUR Clear 07/13/2020 0846   LABSPEC 1.003 (L) 06/15/2020 1354   PHURINE 6.0 06/15/2020 1354   GLUCOSEU 2+ (A) 07/13/2020 0846   HGBUR NEGATIVE 06/15/2020 1354   BILIRUBINUR Negative 07/13/2020 0846   KETONESUR NEGATIVE 06/15/2020 1354   PROTEINUR Negative 07/13/2020 0846   PROTEINUR NEGATIVE 06/15/2020 1354   NITRITE Negative 07/13/2020 0846   NITRITE NEGATIVE 06/15/2020 1354   LEUKOCYTESUR Negative 07/13/2020 0846   LEUKOCYTESUR NEGATIVE 06/15/2020 1354   Sepsis Labs Invalid input(s): PROCALCITONIN,  WBC,  LACTICIDVEN Microbiology Recent Results (from the past 240 hour(s))  Respiratory Panel by  RT PCR (Flu A&B, Covid) - Nasopharyngeal Swab     Status: None   Collection Time: 11/08/20  6:28 AM   Specimen: Nasopharyngeal Swab  Result Value Ref Range Status   SARS Coronavirus 2 by RT PCR NEGATIVE NEGATIVE Final    Comment: (NOTE) SARS-CoV-2 target nucleic acids are NOT DETECTED.  The SARS-CoV-2 RNA is generally detectable in upper respiratoy specimens during the acute phase of infection. The lowest concentration of SARS-CoV-2 viral copies this assay can detect is 131 copies/mL. A negative result does not preclude SARS-Cov-2 infection and should not be used as the sole basis for treatment or other patient management decisions. A negative result may occur with  improper specimen collection/handling, submission of specimen other than nasopharyngeal swab, presence of viral mutation(s) within the areas targeted by this assay, and inadequate number of viral copies (<131 copies/mL). A negative result must be combined with clinical observations, patient history, and epidemiological information. The expected result is Negative.  Fact Sheet for Patients:  PinkCheek.be  Fact Sheet for Healthcare Providers:  GravelBags.it  This test is no t yet approved or cleared by the [place] FDA and  has been authorized for detection and/or diagnosis of  SARS-CoV-2 by FDA under an Emergency Use Authorization (EUA). This EUA will remain  in effect (meaning this test can be used) for the duration of the COVID-19 declaration under Section 564(b)(1) of the Act, 21 U.S.C. section 360bbb-3(b)(1), unless the authorization is terminated or revoked sooner.     Influenza A by PCR NEGATIVE NEGATIVE Final   Influenza B by PCR NEGATIVE NEGATIVE Final    Comment: (NOTE) The Xpert Xpress SARS-CoV-2/FLU/RSV assay is intended as an aid in  the diagnosis of influenza from Nasopharyngeal swab specimens and  should not be used as a sole basis for  treatment. Nasal washings and  aspirates are unacceptable for Xpert Xpress SARS-CoV-2/FLU/RSV  testing.  Fact Sheet for Patients: PinkCheek.be  Fact Sheet for Healthcare Providers: GravelBags.it  This test is not yet approved or cleared by the [place] FDA and  has been authorized for detection and/or diagnosis of SARS-CoV-2 by  FDA under an Emergency Use Authorization (EUA). This EUA will remain  in effect (meaning this test can be used) for the duration of the  Covid-19 declaration under Section 564(b)(1) of the Act, 21  U.S.C. section 360bbb-3(b)(1), unless the authorization is  terminated or revoked. Performed at [facility], [address] [place], [place] [address]   C Difficile Quick Screen w PCR reflex     Status: Abnormal   Collection Time: 11/08/20 11:38 AM   Specimen: STOOL  Result Value Ref Range Status   C Diff antigen POSITIVE (A) NEGATIVE Final   C Diff toxin NEGATIVE NEGATIVE Final    Comment: Performed at [facility], [address], [place], [place] [address]  C. Diff by PCR, Reflexed     Status: Abnormal   Collection Time: 11/09/20 11:38 AM  Result Value Ref Range Status   Toxigenic C. Difficile by PCR POSITIVE (A) NEGATIVE Final    Comment: Positive for toxigenic C. difficile with little to no toxin production. Only treat if clinical presentation suggests symptomatic illness. Performed at [facility], [address], [place], [place] [address]      Time coordinating discharge: Over 30 minutes  SIGNED:   Mckinley Jewel, MD  [facility] 11/10/2020, 2:20 PM Pager   If 7PM-7AM, please contact night-coverage [email or web address]

## 2020-11-10 NOTE — Progress Notes (Signed)
Patient mews fired yellow due to pulse rate of 114. Rechecked 2 mins later and pulse was 105. All vitals stable. Mews turned green. Patient is not currently in any distress at this time. Throughout the night the patient pulse rate has been low 100s at rest.

## 2020-11-15 ENCOUNTER — Ambulatory Visit (INDEPENDENT_AMBULATORY_CARE_PROVIDER_SITE_OTHER): Payer: Medicare HMO | Admitting: Cardiology

## 2020-11-15 ENCOUNTER — Inpatient Hospital Stay: Payer: Medicare HMO | Admitting: Oncology

## 2020-11-15 ENCOUNTER — Encounter: Payer: Self-pay | Admitting: Cardiology

## 2020-11-15 ENCOUNTER — Inpatient Hospital Stay: Payer: Medicare HMO

## 2020-11-15 ENCOUNTER — Other Ambulatory Visit: Payer: Self-pay

## 2020-11-15 VITALS — BP 92/70 | HR 88 | Ht 75.0 in

## 2020-11-15 DIAGNOSIS — I5020 Unspecified systolic (congestive) heart failure: Secondary | ICD-10-CM

## 2020-11-15 DIAGNOSIS — R000 Tachycardia, unspecified: Secondary | ICD-10-CM | POA: Diagnosis not present

## 2020-11-15 DIAGNOSIS — E7800 Pure hypercholesterolemia, unspecified: Secondary | ICD-10-CM

## 2020-11-15 MED ORDER — METOPROLOL SUCCINATE ER 25 MG PO TB24: 12.5000 mg | ORAL_TABLET | Freq: Every day | ORAL | 5 refills | Status: DC

## 2020-11-15 NOTE — Progress Notes (Signed)
Cardiology Office Note:    Date:  11/15/2020   ID:  Tyler Gallagher, DOB [birth year], MRN [id number]  PCP:  Juluis Pitch, MD  [facility] Cardiologist:  Kate Sable, MD  [place] Electrophysiologist:  None   Referring MD: Juluis Pitch, MD   Chief Complaint  Patient presents with  . OTHER    Patient here today as a follow up but did not have the testing done, he did go to the ED for vertigo. Per patient he is only taking the BP medication prescribed by the hospital but unsure of the name of it. Medications verbally reviewed with patient, however he is unsure of correct name of medication.     History of Present Illness:    Tyler Gallagher is a 59 y.o. 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  He was last seen due to low ejection fraction and tachycardia.  Due to low blood pressures, CHF medications were not started.  Ivabradine was started for inappropriate sinus tachycardia. Patient took ivabradine for 7 days, then ran out. Lexiscan Myoview was ordered to evaluate presence of ischemia not obtained since patient was in the hospital.  Patient admitted to the hospital 11/9 due to dizziness, diarrhea, found to be hypotensive.  Diagnosed with C. difficile diarrhea.  Was given IV fluids and antibiotics.  With improvement in blood pressure.  Started on Lopressor 12.5 mg twice daily on discharge and is currently tolerating. He complains of lack of appetite and metallic taste.  Prior notes He had a chest CTA on 06/17/2020 due to shortness of breath.  Right lower lobe mass was noted.  Subsequent MRI to the brain showed left frontal lobe mass.  he subsequently underwent brain resection of mass, with pathology revealing lung adenocarcinoma.  Currently on chemoradiation by oncology.    Patient was noted to have shortness of breath and tachycardia.  Patient had a transthoracic echo on 09/2020 due to palpitations.  Ejection fraction was moderately to severely reduced at 30 to 35%.  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  Denies any history of heart disease.  Past Medical History:  Diagnosis Date  . Diabetes mellitus without complication ([place])   . Hyperlipidemia   . Hypertension   . Lesion of brain   . Lung cancer ([place])   . Mass of lung     Past Surgical History:  Procedure Laterality Date  . 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 COLONOSCOPY    . CYSTOSCOPY W/ RETROGRADES Bilateral 07/14/2020   Procedure: CYSTOSCOPY WITH RETROGRADE PYELOGRAM;  Surgeon: Hollice Espy, MD;  Location: [facility] ORS;  Service: Urology;  Laterality: Bilateral;  . PORTA CATH INSERTION N/A 08/03/2020   Procedure: PORTA CATH INSERTION;  Surgeon: Katha Cabal, MD;  Location: [place] [facility];  Service: Cardiovascular;  Laterality: N/A;  . PORTA CATH INSERTION N/A 10/03/2020   Procedure: PORTA CATH INSERTION;  Surgeon: Algernon Huxley, MD;  Location: [place] [facility];  Service: Cardiovascular;  Laterality: N/A;  . VIDEO BRONCHOSCOPY WITH ENDOBRONCHIAL NAVIGATION N/A 07/01/2020   Procedure: VIDEO BRONCHOSCOPY WITH ENDOBRONCHIAL NAVIGATION;  Surgeon: [name], MD;  Location: [facility] ORS;  Service: Thoracic;  Laterality: N/A;  . VIDEO BRONCHOSCOPY WITH ENDOBRONCHIAL ULTRASOUND N/A 07/01/2020   Procedure: VIDEO BRONCHOSCOPY WITH ENDOBRONCHIAL ULTRASOUND;  Surgeon: [name], MD;  Location: [facility] ORS;  Service: Thoracic;  Laterality: N/A;    Current Medications: Current Meds  Medication Sig  . vancomycin (VANCOCIN) 50 mg/mL oral solution Take 2.5 mLs (125 mg total) by mouth 4 (four) times daily.  . [DISCONTINUED] metoprolol tartrate (LOPRESSOR) 25 MG tablet Take 0.5 tablets (12.5 mg total) by mouth 2 (two) times daily.     Allergies:   Penicillins   Social History   Socioeconomic History  . Marital status: Divorced    Spouse  name: Not on file  . Number of children: Not on file  . Years of education: Not on file  . Highest education level: Not on file  Occupational History  . Not on file  Tobacco Use  . Smoking status: Former Smoker    Packs/day: 2.00    Years: 32.00    Pack years: 64.00    Types: Cigarettes    Quit date: 06/21/2020    Years since quitting: 0.4  . Smokeless tobacco: Never Used  Vaping Use  . Vaping Use: Never used  Substance and Sexual Activity  . Alcohol use: No    Alcohol/week: 0.0 standard drinks  . Drug use: Not Currently    Types: Marijuana  . Sexual activity: Not on file  Other Topics Concern  . Not on file  Social History Narrative   Lives at home with girlfriend   Social Determinants of Health   Financial Resource Strain:   . Difficulty of Paying Living Expenses: Not on file  Food Insecurity:   . Worried About Charity fundraiser in the Last Year: Not on file  . Ran Out of Food in the Last Year: Not on file  Transportation Needs:   . Lack of Transportation (Medical): Not on file  . Lack of Transportation (Non-Medical): Not on file  Physical Activity:   . Days of Exercise per Week: Not on file  . Minutes of Exercise per Session: Not on file  Stress:   . Feeling of Stress : Not on file  Social Connections:   . Frequency of Communication with Friends and Family: Not on file  . Frequency of Social Gatherings with Friends and Family: Not on file  . Attends Religious Services: Not on file  . Active Member of Clubs or Organizations: Not on file  . Attends Archivist Meetings: Not on file  . Marital Status: Not on file     Family History: The patient's family history includes Diabetes in his brother and sister.  ROS:   Please see the history of present illness.     All other systems reviewed and are negative.  EKGs/Labs/Other Studies Reviewed:    The following studies were reviewed today:   EKG:  EKG not ordered today.   Recent Labs: 11/08/2020:  ALT 10; B Natriuretic Peptide 42.3 11/10/2020: BUN <5; Creatinine, Ser 0.45; Hemoglobin 11.4; Magnesium 1.5; Platelets 152; Potassium 3.1; Sodium 138  Recent Lipid Panel    Component Value Date/Time   CHOL 233 (H) 01/01/2016 2310   TRIG 514 (H) 01/01/2016 2310   HDL 26 (L) 01/01/2016 2310   CHOLHDL 9.0 01/01/2016 2310   VLDL UNABLE TO CALCULATE IF TRIGLYCERIDE OVER 400 mg/dL 01/01/2016 2310   LDLCALC UNABLE TO CALCULATE IF TRIGLYCERIDE OVER 400 mg/dL 01/01/2016 2310     Risk Assessment/Calculations:      Physical Exam:    VS:  BP 92/70 (BP Location: Left Arm, Patient Position: Sitting, Cuff Size: Normal)   Pulse 88   Ht 6\' 3"  (1.905 m)   SpO2  98%   BMI 24.32 kg/m     Wt Readings from Last 3 Encounters:  11/10/20 194 lb 9.6 oz (88.3 kg)  10/31/20 210 lb (95.3 kg)  10/27/20 197 lb 11.2 oz (89.7 kg)     GEN:  Well nourished, well developed in no acute distress HEENT: Head scar secondary to prior surgery noted NECK: No JVD; No carotid bruits LYMPHATICS: No lymphadenopathy CARDIAC: Tachycardic, no murmurs RESPIRATORY:  Clear to auscultation without rales, wheezing or rhonchi  ABDOMEN: Soft, non-tender, non-distended MUSCULOSKELETAL:  No edema; above-knee left lower extremity amputation noted SKIN: Warm and dry NEUROLOGIC:  Alert and oriented x 3 PSYCHIATRIC:  Normal affect   ASSESSMENT:    1. HFrEF (heart failure with reduced ejection fraction) ([place])   2. Inappropriate sinus tachycardia   3. Pure hypercholesterolemia    PLAN:    In order of problems listed above:  1. Patient with moderate to severely reduced ejection fraction, EF 30 to 35%.  NYHA class II-III symptoms.  Obtain Lexiscan to evaluate ischemia.  Unsure if symptoms are due to chemo or CAD.  Switch Lopressor to Toprol-XL 12.5 mg daily.  Low blood pressures preventing addition of other CHF meds. 2. Inappropriate sinus tachycardia, heart rate improved.  Stop Lopressor 12.5mg  twice daily, start Toprol-XL  12.5 mg daily. 3. History of hyperlipidemia.  Last lipid panel over 4 years ago. obtain fasting lipid profile.   follow up after [facility]   Medication Adjustments/Labs and Tests Ordered: Current medicines are reviewed at length with the patient today.  Concerns regarding medicines are outlined above.  No orders of the defined types were placed in this encounter.  Meds ordered this encounter  Medications  . metoprolol succinate (TOPROL-XL) 25 MG 24 hr tablet    Sig: Take 0.5 tablets (12.5 mg total) by mouth daily.    Dispense:  15 tablet    Refill:  5    Patient Instructions   Medication Instructions:   Your physician has recommended you make the following change in your medication:   1.  STOP taking your . 2.  STOP taking your . 3.  START taking metoprolol succinate (TOPROL-XL) 25 MG 24 hr tablet: Take 0.5 tablets (12.5 mg total) by mouth daily.   Lab Work:  1.  Your physician recommends that you return for a FASTING lipid profile: just prior to your follow up appointment.  - You will need to be fasting. Please do not have anything to eat or drink after midnight the morning you have the lab work. You may only have water or black coffee with no cream or sugar. - Please go to the [facility]. You will check in at the front desk to the right as you walk into the atrium. Valet Parking is offered if needed. - No appointment needed. You may go any day between 7 am and 6 pm.   Testing/Procedures:  [place]     Your caregiver has ordered a Stress Test with nuclear imaging. The purpose of this test is to evaluate the blood supply to your heart muscle. This procedure is referred to as a "Non-Invasive Stress Test." This is because other than having an IV started in your vein, nothing is inserted or "invades" your body. Cardiac stress tests are done to find areas of poor blood flow to the heart by determining the extent of coronary artery disease (CAD). Some  patients exercise on a treadmill, which naturally increases the blood flow to your heart, while others  who are  unable to walk on a treadmill due to physical limitations have a pharmacologic/chemical stress agent called Lexiscan . This medicine will mimic walking on a treadmill by temporarily increasing your coronary blood flow.      PLEASE REPORT TO [facility] MEDICAL MALL ENTRANCE   THE VOLUNTEERS AT THE FIRST DESK WILL DIRECT YOU WHERE TO GO     *Please note: these test may take anywhere between 2-4 hours to complete       Date of Procedure:_____________________________________   Arrival Time for Procedure:______________________________    PLEASE NOTIFY THE OFFICE AT LEAST 24 HOURS IN ADVANCE IF YOU ARE UNABLE TO KEEP YOUR APPOINTMENT.  [place] 24 HOURS IN ADVANCE IF YOU ARE UNABLE TO KEEP YOUR APPOINTMENT. [phone]      How to prepare for your Myoview test:      _XX___:  Hold betablocker(s) night before procedure and morning of procedure:     metoprolol succinate (TOPROL-XL)      1.         Do not eat or drink after midnight  2.         No caffeine for 24 hours prior to test  3.         No smoking 24 hours prior to test.  4.         Unless instructed otherwise, Take your medication with a small sips of water.         5.         No perfume, cologne or lotion.    Follow-Up: At [facility], you and your health needs are our priority.  As part of our continuing mission to provide you with exceptional heart care, we have created designated Provider Care Teams.  These Care Teams include your primary Cardiologist (physician) and Advanced Practice Providers (APPs -  Physician Assistants and Nurse Practitioners) who all work together to provide you with the care you need, when you need it.  We recommend signing up for the patient portal called "MyChart".  Sign up information is provided on this After Visit Summary.   MyChart is used to connect with patients for Virtual Visits (Telemedicine).  Patients are able to view lab/test results, encounter notes, upcoming appointments, etc.  Non-urgent messages can be sent to your provider as well.   To learn more about what you can do with MyChart, go to [email or web address].    Your next appointment:   Follow up after Myoview   The format for your next appointment:   In Person  Provider:   Kate Sable, MD   Other Instructions       Signed, Kate Sable, MD  11/15/2020 10:14 AM    [place]

## 2020-11-15 NOTE — Patient Instructions (Addendum)
Medication Instructions:   Your physician has recommended you make the following change in your medication:   1.  STOP taking your . 2.  STOP taking your . 3.  START taking metoprolol succinate (TOPROL-XL) 25 MG 24 hr tablet: Take 0.5 tablets (12.5 mg total) by mouth daily.   Lab Work:  1.  Your physician recommends that you return for a FASTING lipid profile: just prior to your follow up appointment.  - You will need to be fasting. Please do not have anything to eat or drink after midnight the morning you have the lab work. You may only have water or black coffee with no cream or sugar. - Please go to the [facility]. You will check in at the front desk to the right as you walk into the atrium. Valet Parking is offered if needed. - No appointment needed. You may go any day between 7 am and 6 pm.   Testing/Procedures:  [place]     Your caregiver has ordered a Stress Test with nuclear imaging. The purpose of this test is to evaluate the blood supply to your heart muscle. This procedure is referred to as a "Non-Invasive Stress Test." This is because other than having an IV started in your vein, nothing is inserted or "invades" your body. Cardiac stress tests are done to find areas of poor blood flow to the heart by determining the extent of coronary artery disease (CAD). Some patients exercise on a treadmill, which naturally increases the blood flow to your heart, while others who are  unable to walk on a treadmill due to physical limitations have a pharmacologic/chemical stress agent called Lexiscan . This medicine will mimic walking on a treadmill by temporarily increasing your coronary blood flow.      PLEASE REPORT TO [facility] MEDICAL MALL ENTRANCE   THE VOLUNTEERS AT THE FIRST DESK WILL DIRECT YOU WHERE TO GO     *Please note: these test may take anywhere between 2-4 hours to complete       Date of Procedure:_____________________________________   Arrival Time for  Procedure:______________________________    PLEASE NOTIFY THE OFFICE AT LEAST 24 HOURS IN ADVANCE IF YOU ARE UNABLE TO KEEP YOUR APPOINTMENT.  [place] 24 HOURS IN ADVANCE IF YOU ARE UNABLE TO KEEP YOUR APPOINTMENT. [phone]      How to prepare for your Myoview test:      _XX___:  Hold betablocker(s) night before procedure and morning of procedure:     metoprolol succinate (TOPROL-XL)      1.         Do not eat or drink after midnight  2.         No caffeine for 24 hours prior to test  3.         No smoking 24 hours prior to test.  4.         Unless instructed otherwise, Take your medication with a small sips of water.         5.         No perfume, cologne or lotion.    Follow-Up: At [facility], you and your health needs are our priority.  As part of our continuing mission to provide you with exceptional heart care, we have created designated Provider Care Teams.  These Care Teams include your primary Cardiologist (physician) and Advanced Practice Providers (APPs -  Physician Assistants and Nurse Practitioners) who all work  together to provide you with the care you need, when you need it.  We recommend signing up for the patient portal called "MyChart".  Sign up information is provided on this After Visit Summary.  MyChart is used to connect with patients for Virtual Visits (Telemedicine).  Patients are able to view lab/test results, encounter notes, upcoming appointments, etc.  Non-urgent messages can be sent to your provider as well.   To learn more about what you can do with MyChart, go to [email or web address].    Your next appointment:   Follow up after Myoview   The format for your next appointment:   In Person  Provider:   Kate Sable, MD   Other Instructions

## 2020-11-16 ENCOUNTER — Inpatient Hospital Stay (HOSPITAL_BASED_OUTPATIENT_CLINIC_OR_DEPARTMENT_OTHER): Payer: Medicare HMO | Admitting: Hospice and Palliative Medicine

## 2020-11-16 DIAGNOSIS — Z515 Encounter for palliative care: Secondary | ICD-10-CM

## 2020-11-16 DIAGNOSIS — C3431 Malignant neoplasm of lower lobe, right bronchus or lung: Secondary | ICD-10-CM

## 2020-11-16 NOTE — Progress Notes (Signed)
Virtual Visit via Telephone Note  I connected with Tyler Gallagher on 11/16/20 at  2:00 PM EST by telephone and verified that I am speaking with the correct person using two identifiers.  Location: Patient: home Provider: clinic   I discussed the limitations, risks, security and privacy concerns of performing an evaluation and management service by telephone and the availability of in person appointments. I also discussed with the patient that there may be a patient responsible charge related to this service. The patient expressed understanding and agreed to proceed.   History of Present Illness: Tyler Gallagher is a 59 y.o. 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 Patient was recently found to have a cardiomyopathy with an EF of 30 to 35%.  He has had ongoing chronic nausea and diarrhea throughout chemotherapy. Patient was hospitalized 11/08/2020 -11/10/2020 with severe vertigo.  MRI of the brain revealed a small cluster of nodularity in the left frontal parietal cortex concerning for possible subacute infarcts.  CT of the chest, abdomen, and pelvis revealed improvement in his primary lung nodule but new interval development of liver metastasis.  He was referred to palliative care to help address goals and manage ongoing symptoms.   Observations/Objective: I called and spoke with patient.  He reports since discharging home from the hospital that he may be doing slightly better.  He is having less diarrhea on oral vancomycin.  He still occasionally has some vertigo but overall says that that has improved.  He is drinking more fluids from Ensure clear but says that his food intake is still minimal.  He just cannot find anything that tastes good.  Patient  denies any other significant changes or concerns today.  Patient asked about upcoming medical appointments.  He is pending a CT scan tomorrow.  However, discussed with Dr. Janese Gallagher and can cancel that as patient had recent CTs in the hospital.  We will plan follow-up with her in the clinic.  Assessment and Plan: Stage IV lung cancer -on treatment with systemic chemotherapy.  Patient has been highly symptomatic recently with poor oral intake and chronic nausea and vomiting.  He seems to be doing slightly better since discharging her from the hospital.  Spoke with Dr. Janese Gallagher and will continue offering patient supportive care.  Okay to cancel CT tomorrow.  We will plan follow-up with Dr. Janese Gallagher later this week  Follow Up Instructions: Follow-up virtual visit 2 weeks.   I discussed the assessment and treatment plan with the patient. The patient was provided an opportunity to ask questions and all were answered. The patient agreed with the plan and demonstrated an understanding of the instructions.   The patient was advised to call back or seek an in-person evaluation if the symptoms worsen or if the condition fails to improve as anticipated.  I provided 10 minutes of non-face-to-face time during this encounter.   Tyler Hong, NP

## 2020-11-17 ENCOUNTER — Ambulatory Visit: Admission: RE | Admit: 2020-11-17 | Payer: Medicare HMO | Source: Ambulatory Visit

## 2020-11-18 ENCOUNTER — Encounter: Payer: Self-pay | Admitting: Oncology

## 2020-11-18 ENCOUNTER — Ambulatory Visit
Admission: RE | Admit: 2020-11-18 | Discharge: 2020-11-18 | Disposition: A | Discharge status: Home / Self Care | Payer: Medicare HMO | Source: Ambulatory Visit | Attending: Cardiology | Admitting: Cardiology

## 2020-11-18 ENCOUNTER — Other Ambulatory Visit: Payer: Self-pay

## 2020-11-18 ENCOUNTER — Inpatient Hospital Stay: Payer: Medicare HMO

## 2020-11-18 ENCOUNTER — Inpatient Hospital Stay (HOSPITAL_BASED_OUTPATIENT_CLINIC_OR_DEPARTMENT_OTHER): Payer: Medicare HMO | Admitting: Oncology

## 2020-11-18 VITALS — BP 104/81 | HR 92 | Temp 97.3°F | Resp 16 | Wt 190.7 lb

## 2020-11-18 DIAGNOSIS — C3431 Malignant neoplasm of lower lobe, right bronchus or lung: Secondary | ICD-10-CM

## 2020-11-18 DIAGNOSIS — E871 Hypo-osmolality and hyponatremia: Secondary | ICD-10-CM

## 2020-11-18 DIAGNOSIS — D701 Agranulocytosis secondary to cancer chemotherapy: Secondary | ICD-10-CM | POA: Diagnosis not present

## 2020-11-18 DIAGNOSIS — I712 Thoracic aortic aneurysm, without rupture: Secondary | ICD-10-CM | POA: Diagnosis not present

## 2020-11-18 DIAGNOSIS — E876 Hypokalemia: Secondary | ICD-10-CM | POA: Diagnosis not present

## 2020-11-18 DIAGNOSIS — Z7189 Other specified counseling: Secondary | ICD-10-CM | POA: Diagnosis not present

## 2020-11-18 DIAGNOSIS — I5020 Unspecified systolic (congestive) heart failure: Secondary | ICD-10-CM | POA: Diagnosis not present

## 2020-11-18 DIAGNOSIS — J439 Emphysema, unspecified: Secondary | ICD-10-CM | POA: Diagnosis not present

## 2020-11-18 DIAGNOSIS — E119 Type 2 diabetes mellitus without complications: Secondary | ICD-10-CM | POA: Diagnosis not present

## 2020-11-18 DIAGNOSIS — E860 Dehydration: Secondary | ICD-10-CM

## 2020-11-18 DIAGNOSIS — E785 Hyperlipidemia, unspecified: Secondary | ICD-10-CM | POA: Diagnosis not present

## 2020-11-18 DIAGNOSIS — I10 Essential (primary) hypertension: Secondary | ICD-10-CM | POA: Diagnosis not present

## 2020-11-18 LAB — CBC WITH DIFFERENTIAL/PLATELET
Abs Immature Granulocytes: 0.01 10*3/uL (ref 0.00–0.07)
Basophils Absolute: 0 10*3/uL (ref 0.0–0.1)
Basophils Relative: 1 %
Eosinophils Absolute: 0.1 10*3/uL (ref 0.0–0.5)
Eosinophils Relative: 2 %
HCT: 35.5 % — ABNORMAL LOW (ref 39.0–52.0)
Hemoglobin: 12.5 g/dL — ABNORMAL LOW (ref 13.0–17.0)
Immature Granulocytes: 0 %
Lymphocytes Relative: 16 %
Lymphs Abs: 0.7 10*3/uL (ref 0.7–4.0)
MCH: 31.4 pg (ref 26.0–34.0)
MCHC: 35.2 g/dL (ref 30.0–36.0)
MCV: 89.2 fL (ref 80.0–100.0)
Monocytes Absolute: 0.4 10*3/uL (ref 0.1–1.0)
Monocytes Relative: 10 %
Neutro Abs: 3.1 10*3/uL (ref 1.7–7.7)
Neutrophils Relative %: 71 %
Platelets: 231 10*3/uL (ref 150–400)
RBC: 3.98 MIL/uL — ABNORMAL LOW (ref 4.22–5.81)
RDW: 17.5 % — ABNORMAL HIGH (ref 11.5–15.5)
WBC: 4.3 10*3/uL (ref 4.0–10.5)
nRBC: 0 % (ref 0.0–0.2)

## 2020-11-18 LAB — COMPREHENSIVE METABOLIC PANEL
ALT: 18 U/L (ref 0–44)
AST: 22 U/L (ref 15–41)
Albumin: 3.1 g/dL — ABNORMAL LOW (ref 3.5–5.0)
Alkaline Phosphatase: 80 U/L (ref 38–126)
Anion gap: 12 (ref 5–15)
BUN: 5 mg/dL — ABNORMAL LOW (ref 6–20)
CO2: 26 mmol/L (ref 22–32)
Calcium: 8.6 mg/dL — ABNORMAL LOW (ref 8.9–10.3)
Chloride: 92 mmol/L — ABNORMAL LOW (ref 98–111)
Creatinine, Ser: 0.61 mg/dL (ref 0.61–1.24)
GFR, Estimated: >60 mL/min (ref >60)
Glucose, Bld: 164 mg/dL — ABNORMAL HIGH (ref 70–99)
Potassium: 2.8 mmol/L — ABNORMAL LOW (ref 3.5–5.1)
Sodium: 130 mmol/L — ABNORMAL LOW (ref 135–145)
Total Bilirubin: 0.9 mg/dL (ref 0.3–1.2)
Total Protein: 7.1 g/dL (ref 6.5–8.1)

## 2020-11-18 LAB — NM MYOCAR MULTI W/SPECT W/WALL MOTION / EF
Estimated workload: 1 METS
Exercise duration (min): 0 min
Exercise duration (sec): 0 s
LV dias vol: 59 mL (ref 62–150)
LV sys vol: 28 mL
MPHR: 162 {beats}/min
Peak HR: 133 {beats}/min
Percent HR: 82 %
Rest HR: 121 {beats}/min
SDS: 6
SRS: 1
SSS: 7
TID: 1.09

## 2020-11-18 LAB — MAGNESIUM: Magnesium: 1.7 mg/dL (ref 1.7–2.4)

## 2020-11-18 MED ORDER — TECHNETIUM TC 99M TETROFOSMIN IV KIT
30.0000 | PACK | Freq: Once | INTRAVENOUS | Status: AC | PRN
  Administered 2020-11-18: 32.55 via INTRAVENOUS

## 2020-11-18 MED ORDER — REGADENOSON 0.4 MG/5ML IV SOLN
0.4000 mg | Freq: Once | INTRAVENOUS | Status: AC
  Administered 2020-11-18: 0.4 mg via INTRAVENOUS
  Filled 2020-11-18: qty 5

## 2020-11-18 MED ORDER — POTASSIUM CHLORIDE IN NACL 20-0.9 MEQ/L-% IV SOLN
Freq: Once | INTRAVENOUS | Status: AC
  Filled 2020-11-18: qty 1000

## 2020-11-18 MED ORDER — SODIUM CHLORIDE 0.9% FLUSH
10.0000 mL | INTRAVENOUS | Status: DC | PRN
  Administered 2020-11-18: 10 mL via INTRAVENOUS
  Filled 2020-11-18: qty 10

## 2020-11-18 MED ORDER — TECHNETIUM TC 99M TETROFOSMIN IV KIT
10.6700 | PACK | Freq: Once | INTRAVENOUS | Status: AC | PRN
  Administered 2020-11-18: 10.67 via INTRAVENOUS

## 2020-11-18 MED ORDER — HEPARIN SOD (PORK) LOCK FLUSH 100 UNIT/ML IV SOLN
500.0000 [IU] | Freq: Once | INTRAVENOUS | Status: AC
  Administered 2020-11-18: 500 [IU] via INTRAVENOUS
  Filled 2020-11-18: qty 5

## 2020-11-18 MED ORDER — POTASSIUM CHLORIDE CRYS ER 20 MEQ PO TBCR: 20.0000 meq | EXTENDED_RELEASE_TABLET | Freq: Two times a day (BID) | ORAL | 0 refills | Status: DC

## 2020-11-18 NOTE — Progress Notes (Signed)
Pt not eating hardly anything, drinking 3 clear ensure a day. I asked him to take 5 a day. Asked him to try snacking on things everything he tries has metal taste or a bad taste. He drinks some water. He has HA and it comes and goes. Girlfriend says that he want take a aspirin or tylenol. Sometimes he sits at home and cries when he hurts. He does not have diarrhea anymore. He has stools and it has some form to it. He feels that since he has been taking the c diff atb that food taste changes. He states that he not feeling dizzy at all .

## 2020-11-18 NOTE — Progress Notes (Signed)
Received IVF with potassium in [facility]. Tolerated infusion. Discharged from clinic in wheelchair accompanied by girlfriend. No complaints offered at time of discharge.

## 2020-11-22 NOTE — Progress Notes (Signed)
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 CTA showed no pulmonary embolism but a 3.8 x 3.8 cm medial right lower lobe mass abutting the right paravertebral pleural margin. No involvement of adjacent vertebral bodies and consistent with primary lung malignancy. He then presented to ER with symptoms of increasing weakness over the right side. He underwent MRI brain with and without contrast at [facility] which showed a solitary metastatic deposit in the left medial frontal lobe with surrounding vasogenic edema. Also had a CT abdomen which showed large area of heterogeneous attenuation within the lumen of the urinary bladder. While this may represent a large amount of blood products the presence of urinary bladder mass not excluded.Patient taken for cystoscopy and TURBT which did not show any bladder mass  PET CT scan showed 3.7 cm right lower lobe mass with an SUV of 30.7. The mass abuts the visceral pleura without definite pleural invasion. No hypermetabolic adenopathy or evidence of distant metastatic disease. Patient underwent bronchoscopy with [name] which showed non-small cell lung cancer favoradenocarcinoma  Patient  underwent resection of the solitary left frontal lobe brain mass which was also consistent with TTF-1 positive lung adenocarcinoma.However repeat MRI brain showed possible Concern for new brain metastases and therefore patient received whole brain radiation.  Plan is for weekly carbotaxol chemotherapy followed by maintenance durvalumab.Omniseqtesting showed high tumor mutational burden and PD-L1 of 70%. No other actionable mutations noted  Interval history-patient reports feeling little better since his hospital discharge.  He does not have significant nausea or vomiting but food still does not taste good and he is having a hard time eating anything.  He mainly relies on protein shakes but has only been taking about 3/day.  He is finishing up his oral vancomycin course and reports having formed stools.  ECOG PS- 2 Pain scale- 0   Review of systems- Review of Systems  Constitutional: Positive for malaise/fatigue. Negative for chills, fever and weight loss.       Altered taste sensation  HENT: Negative for congestion, ear discharge and nosebleeds.   Eyes: Negative for blurred vision.  Respiratory: Negative for cough, hemoptysis, sputum production, shortness of breath and wheezing.   Cardiovascular: Negative for chest pain, palpitations, orthopnea and claudication.  Gastrointestinal: Negative for abdominal pain, blood in stool, constipation, diarrhea, heartburn, melena, nausea and vomiting.  Genitourinary: Negative for dysuria, flank pain, frequency, hematuria and urgency.  Musculoskeletal: Negative for back pain, joint pain and myalgias.  Skin: Negative for rash.  Neurological: Negative for dizziness, tingling, focal weakness, seizures, weakness and headaches.  Endo/Heme/Allergies: Does not bruise/bleed easily.  Psychiatric/Behavioral: Negative for depression and suicidal ideas. The patient does not have insomnia.        Allergies  Allergen Reactions  . Penicillins Other (See  Comments)    unknown  Past Medical History:  Diagnosis Date  . Diabetes mellitus without complication ([place])   . Hyperlipidemia   . Hypertension   . Lesion of brain   . Lung cancer ([place])   . Mass of lung      Past Surgical History:  Procedure Laterality Date  . 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 COLONOSCOPY    . CYSTOSCOPY W/ RETROGRADES Bilateral 07/14/2020   Procedure: CYSTOSCOPY WITH RETROGRADE PYELOGRAM;  Surgeon: Hollice Espy, MD;  Location: [facility] OR[facility];  Service: Urology;  Laterality: Bilateral;  . PORTA CATH INSERTION N/A 08/03/2020   Procedure: PORTA CATH INSERTION;  Surgeon: Katha Cabal, MD;  Location: [place] [facility];  Service: Cardiovascular;  Laterality: N/A;  . PORTA CATH INSERTION N/A 10/03/2020   Procedure: PORTA CATH INSERTION;  Surgeon: Algernon Huxley, MD;  Location: [place] [facility];  Service: Cardiovascular;  Laterality: N/A;  . VIDEO BRONCHOSCOPY WITH ENDOBRONCHIAL NAVIGATION N/A 07/01/2020   Procedure: VIDEO BRONCHOSCOPY WITH ENDOBRONCHIAL NAVIGATION;  Surgeon: [name], MD;  Location: [facility] OR[facility];  Service: Thoracic;  Laterality: N/A;  . VIDEO BRONCHOSCOPY WITH ENDOBRONCHIAL ULTRASOUND N/A 07/01/2020   Procedure: VIDEO BRONCHOSCOPY WITH ENDOBRONCHIAL ULTRASOUND;  Surgeon: [name], MD;  Location: [facility];  Service: Thoracic;  Laterality: N/A;    Social History   Socioeconomic History  . Marital status: Divorced    Spouse name: Not on file  . Number of children: Not on file  . Years of education: Not on file  . Highest education level: Not on file  Occupational History  . Not on file  Tobacco Use  . Smoking status: Former Smoker    Packs/day: 2.00    Years: 32.00    Pack years: 64.00    Types: Cigarettes    Quit date: 06/21/2020    Years since quitting: 0.4  . Smokeless tobacco: Never Used  Vaping Use  . Vaping Use: Never used  Substance and Sexual Activity  . Alcohol use: No    Alcohol/week: 0.0 standard drinks  . Drug use:  Not Currently    Types: Marijuana  . Sexual activity: Not on file  Other Topics Concern  . Not on file  Social History Narrative   Lives at home with girlfriend   Social Determinants of Health   Financial Resource Strain:   . Difficulty of Paying Living Expenses: Not on file  Food Insecurity:   . Worried About Charity fundraiser in the Last Year: Not on file  . Ran Out of Food in the Last Year: Not on file  Transportation Needs:   . Lack of Transportation (Medical): Not on file  . Lack of Transportation (Non-Medical): Not on file  Physical Activity:   . Days of Exercise per Week: Not on file  . Minutes of Exercise per Session: Not on file  Stress:   . Feeling of Stress : Not on file  Social Connections:   . Frequency of Communication with Friends and Family: Not on file  . Frequency of Social Gatherings with Friends and Family: Not on file  . Attends Religious Services: Not on file  . Active Member of Clubs or Organizations: Not on file  . Attends Archivist Meetings: Not on file  . Marital Status: Not on file  Intimate Partner Violence:   . Fear of Current or Ex-Partner: Not on file  . Emotionally Abused: Not on file  . Physically Abused: Not on file  . Sexually Abused: Not on file  Family History  Problem Relation Age of Onset  . Diabetes Sister   . Diabetes Brother      Current Outpatient Medications:  .  metoprolol succinate (TOPROL-XL) 25 MG 24 hr tablet, Take 0.5 tablets (12.5 mg total) by mouth daily. (Patient taking differently: Take 12.5 mg by mouth in the morning and at bedtime. ), Disp: 15 tablet, Rfl: 5 .  vancomycin (VANCOCIN) 50 mg/mL oral solution, Take 2.5 mLs (125 mg total) by mouth 4 (four) times daily., Disp: , Rfl:  .  potassium chloride SA (KLOR-CON) 20 MEQ tablet, Take 1 tablet (20 mEq total) by mouth 2 (two) times daily., Disp: 28 tablet, Rfl: 0 No current facility-administered medications for this visit.  Facility-Administered  Medications Ordered in Other Visits:  .  heparin lock flush 100 unit/mL, 500 Units, Intravenous, Once, Brahmanday, Govinda R, MD .  sodium chloride flush (NS) 0.9 % injection 10 mL, 10 mL, Intravenous, Once, Cammie Sickle, MD  Physical exam:  Vitals:   11/18/20 1320  BP: 104/81  Pulse: 92  Resp: 16  Temp: (!) 97.3 F (36.3 C)  TempSrc: Oral  Weight: 190 lb 11.2 oz (86.5 kg)   Physical Exam HENT:     Mouth/Throat:     Mouth: Mucous membranes are moist.     Pharynx: Oropharynx is clear.  Cardiovascular:     Rate and Rhythm: Normal rate and regular rhythm.     Heart sounds: Normal heart sounds.  Pulmonary:     Effort: Pulmonary effort is normal.     Breath sounds: Normal breath sounds.  Abdominal:     General: Bowel sounds are normal.     Palpations: Abdomen is soft.  Skin:    General: Skin is warm and dry.  Neurological:     Mental Status: He is alert and oriented to person, place, and time.      CMP Latest Ref Rng & Units 11/18/2020  Glucose 70 - 99 mg/dL 164(H)  BUN 6 - 20 mg/dL 5(L)  Creatinine 0.61 - 1.24 mg/dL 0.61  Sodium 135 - 145 mmol/L 130(L)  Potassium 3.5 - 5.1 mmol/L 2.8(L)  Chloride 98 - 111 mmol/L 92(L)  CO2 22 - 32 mmol/L 26  Calcium 8.9 - 10.3 mg/dL 8.6(L)  Total Protein 6.5 - 8.1 g/dL 7.1  Total Bilirubin 0.3 - 1.2 mg/dL 0.9  Alkaline Phos 38 - 126 U/L 80  AST 15 - 41 U/L 22  ALT 0 - 44 U/L 18   CBC Latest Ref Rng & Units 11/18/2020  WBC 4.0 - 10.5 K/uL 4.3  Hemoglobin 13.0 - 17.0 g/dL 12.5(L)  Hematocrit 39 - 52 % 35.5(L)  Platelets 150 - 400 K/uL 231    No images are attached to the encounter.  MR Brain W and Wo Contrast  Result Date: 11/08/2020 CLINICAL DATA:  Vertigo. History of lung cancer and brain metastases EXAM: MRI HEAD WITHOUT AND WITH CONTRAST TECHNIQUE: Multiplanar, multiecho pulse sequences of the brain and surrounding structures were obtained without and with intravenous contrast. CONTRAST:  70m GADAVIST GADOBUTROL 1  MMOL/ML IV SOLN COMPARISON:  08/11/2020 FINDINGS: Brain: Recent left parafalcine resection at the vertex with significantly diminished, essentially resolved, enhancement. Punctate foci of enhancement clustered in the superior right frontal lobe are resolved since prior but there are is a new area of similar size punctate enhancement clustered left parietal cortex which are marked on 103:833and adjacent slices. In retrospect there was a nodule of enhancement along the parasagittal left parietal cortex  which has increased in size and now measures 5 mm, consistent with metastasis. Persistent and unchanged 2 mm focus of enhancement in the anterior and medial right temporal lobe on 18:66. No acute infarct. Foci of restricted diffusion in the left cerebellum on prior have resolved. No hemorrhage, hydrocephalus, or masslike finding. 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 Vascular: Normal flow voids and vascular enhancements Skull and upper cervical spine: Unremarkable left-sided craniotomy. Sinuses/Orbits: Retention cyst in the right maxillary sinus and partial left mastoid opacification with negative nasopharynx. IMPRESSION: 1. No acute finding. 2. Diminished/resolved enhancement at the left parafalcine frontal lobe resection site. 3. 5 mm left parietal nodule which has slightly increased from a 08/11/2020 scan, consistent with metastatic disease. 4. Unchanged 2 mm of focus of enhancement in the medial right temporal lobe, also most consistent with metastasis. 5. On prior there was clustered punctate enhancing foci in the right frontal lobe which have resolved, favor subacute infarcts. Today there is a similar cluster of tiny nodular enhancement along the left frontal parietal cortex, which may also be subacute infarcts. Electronically Signed   By: Monte Fantasia M.D.   On: 11/08/2020 07:52   CT CHEST ABDOMEN PELVIS W CONTRAST  Result Date: 11/08/2020 CLINICAL DATA:  Metastatic small cell  lung cancer. EXAM: CT CHEST, ABDOMEN, AND PELVIS WITH CONTRAST TECHNIQUE: Multidetector CT imaging of the chest, abdomen and pelvis was performed following the standard protocol during bolus administration of intravenous contrast. CONTRAST:  126m OMNIPAQUE IOHEXOL 300 MG/ML  SOLN COMPARISON:  None. FINDINGS: CT CHEST FINDINGS Cardiovascular: The heart is normal in size. No pericardial effusion. 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 No dissection. The branch vessels are patent. Stable coronary artery calcifications. The pulmonary arteries are unremarkable. Mediastinum/Nodes: No mediastinal or hilar mass or lymphadenopathy. The esophagus is grossly normal. Lungs/Pleura: Interval decrease in size of the right lower lobe pulmonary mass. It measures approximately 2.3 x 2.0 cm on image 103/4. It previously measured a maximum of 3.8 x 3.5 cm. No pulmonary nodules to suggest pulmonary metastatic disease. No significant inflammatory or infectious changes. No pleural effusions. Stable emphysematous changes and areas of pulmonary scarring. Musculoskeletal: The chest wall is unremarkable. No supraclavicular or axillary adenopathy. The thyroid gland appears normal. The bony thorax is intact. CT ABDOMEN PELVIS FINDINGS Hepatobiliary: Vague low-attenuation liver lesions highly suspicious for metastatic disease. 12.5 mm lesion is noted at the left hepatic dome in segment 2. 13 mm lesion at the hepatic dome in segment 8 on image 53/2. Other smaller right hepatic lesions are noted. No biliary dilatation. Layering gallstones are noted the gallbladder. The common bile duct is normal in caliber. Pancreas: No mass, inflammation or ductal dilatation. Spleen: Normal size. No focal lesions crash that normal size. Small low-attenuation lesion anteriorly is likely a benign cyst. Adrenals/Urinary Tract: The adrenals are normal. No evidence of metastatic disease. No worrisome renal  lesions or hydronephrosis. The bladder is unremarkable. Stomach/Bowel: The stomach, duodenum, small bowel and colon are unremarkable. No acute inflammatory changes, mass lesions or obstructive findings. The terminal ileum is normal. The appendix is normal. Vascular/Lymphatic: Advanced atherosclerotic calcifications involving the aorta and iliac arteries but no aneurysm. No mesenteric or retroperitoneal mass or adenopathy. Reproductive: The prostate gland and seminal vesicles are unremarkable. Other: No pelvic mass or adenopathy. No free pelvic fluid collections. No inguinal mass or adenopathy. No abdominal wall hernia or subcutaneous lesions. Musculoskeletal: No significant bony findings. No worrisome  bone lesions to suggest metastatic disease. IMPRESSION: 1. Interval decrease in size of the right lower lobe pulmonary mass. No mediastinal or hilar adenopathy. 2. No findings for pulmonary metastatic disease. 3. Vague low-attenuation liver lesions highly suspicious for metastatic disease. 4. No findings for abdominal/pelvic metastatic adenopathy. 5. Stable emphysematous changes and pulmonary scarring. 6. Advanced atherosclerotic calcifications involving the aorta and iliac arteries. 7. Cholelithiasis. 8. Emphysema and aortic atherosclerosis. Aortic Atherosclerosis (ICD10-I70.0) and Emphysema (ICD10-J43.9). Electronically Signed   By: Marijo Sanes M.D.   On: 11/08/2020 11:30   NM Myocar Multi W/Spect W/Wall Motion / EF  Result Date: 11/18/2020  There was no ST segment deviation noted during stress.  No T wave inversion was noted during stress.  This is a low risk study.  Nuclear stress EF: 44%.  No evidence of perfusion defects on stress images.  CT attenuation images show moderate coronary calcifications.    ECHOCARDIOGRAM COMPLETE  Result Date: 10/26/2020    ECHOCARDIOGRAM REPORT   Patient Name:   JOBE MUTCH Date of Exam: 10/26/2020 Medical Rec #:  [id number]        Height:       75.0 in  Accession #:    [id number]       Weight:       208.0 lb Date of Birth:  09/24/1961        BSA:          2.231 m Patient Age:    38 years         BP:           144/76 mmHg Patient Gender: M                HR:           97 bpm. Exam Location:  [facility] Procedure: 2D Echo, Cardiac Doppler, Color Doppler and Intracardiac            Opacification Agent Indications:     Palpitations 785.1  History:         Patient has prior history of Echocardiogram examinations, most                  recent 01/02/2016. Risk Factors:Hypertension, Dyslipidemia and                  Diabetes. Lung cancer.  Sonographer:     Sherrie Sport RDCS (AE) Referring Phys:  [id number] [facility] Diagnosing Phys: Bartholome Bill MD  Sonographer Comments: Technically difficult study due to poor echo windows, no apical window and no subcostal window. IMPRESSIONS  1. Left ventricular ejection fraction, by estimation, is 30 to 35%. The left ventricle has moderately decreased function. The left ventricle demonstrates regional wall motion abnormalities (see scoring diagram/findings for description). The left ventricular internal cavity size was mildly dilated. Left ventricular diastolic parameters were normal.  2. Right ventricular systolic function is normal. The right ventricular size is normal.  3. Left atrial size was mildly dilated.  4. The mitral valve was not well visualized. Trivial mitral valve regurgitation.  5. The aortic valve was not well visualized. Aortic valve regurgitation is not visualized. FINDINGS  Left Ventricle: Left ventricular ejection fraction, by estimation, is 30 to 35%. The left ventricle has moderately decreased function. The left ventricle demonstrates regional wall motion abnormalities. Definity contrast agent was given IV to delineate the left ventricular endocardial borders. The left ventricular internal cavity size was mildly dilated. There is no left ventricular hypertrophy. Left ventricular diastolic parameters  were normal.  LV Wall  Scoring: The apical lateral segment, apical septal segment, and apex are akinetic. The antero-lateral wall, mid inferoseptal segment, and basal inferoseptal segment are hypokinetic. Right Ventricle: The right ventricular size is normal. No increase in right ventricular wall thickness. Right ventricular systolic function is normal. Left Atrium: Left atrial size was mildly dilated. Right Atrium: Right atrial size was normal in size. Pericardium: There is no evidence of pericardial effusion. Mitral Valve: The mitral valve was not well visualized. Trivial mitral valve regurgitation. Tricuspid Valve: The tricuspid valve is not well visualized. Tricuspid valve regurgitation is trivial. Aortic Valve: The aortic valve was not well visualized. Aortic valve regurgitation is not visualized. Pulmonic Valve: The pulmonic valve was not well visualized. Pulmonic valve regurgitation is not visualized. Aorta: The aortic root is normal in size and structure. IAS/Shunts: The interatrial septum was not assessed.  LEFT VENTRICLE PLAX 2D LVIDd:         4.49 cm LVIDs:         3.77 cm LV PW:         1.11 cm LV IVS:        1.26 cm LVOT diam:     2.10 cm LVOT Area:     3.46 cm  LEFT ATRIUM         Index LA diam:    2.50 cm 1.12 cm/m                        PULMONIC VALVE AORTA                 PV Vmax:        0.47 m/s Ao Root diam: 3.30 cm PV Peak grad:   0.9 mmHg                       RVOT Peak grad: 1 mmHg   SHUNTS Systemic Diam: 2.10 cm Bartholome Bill MD Electronically signed by Bartholome Bill MD Signature Date/Time: 10/26/2020/2:57:45 PM    Final      Assessment and plan- Patient is a 59 y.o. male  with stage IVacT2 cN0 cM1 adenocarcinoma of the right lung with isolated left frontal lobe metastasess/p resection of brain met.This was followed by whole brain radiation as there was concern for new areas of brain metastases.   Patient is s/p whole brain radiation as well as concurrent chemoradiation for his lung.  He is here to discuss  further plan of care for lung cancer  Patient was recently hospitalized for nausea vomiting and diarrhea which has improved.  He is still struggling with his appetite and taste sensation and only sustaining himself on 3 Ensure clears a day which is not meeting his calorie needs.  States her regular Ensure does not taste good to him.  I have again encouraged him to try whatever foods he likes in addition to Ensure clear. He has already received trial of steroids in the past.  We have also given him a trial of olanzapine which has not helped him.  He was found to have an EF of 30 to 35% and I am hesitant to try Marinol for him.  Now that he is done with his whole brain radiation and concurrent chemoradiation I would like to see how he does in the next couple of weeks before making any changes to his medication regimen  Patient had a stress test on 11/18/2020 the results of which were not available  at the time of my visit with the patient.  However the test results show no evidence of ischemia with an EF of 44%.  I will therefore consider Marinol for him in the future  Chemo-induced neutropenia: Resolved  I have again discussed CT chest abdomen pelvis findings with the patient which shows interval decrease in the size of the right lower lobe mass but he was found to have low-attenuation lesions in the liver that were concerning for metastatic disease.  Patient had his initial PET scan in June 2021 but probably start chemoradiation in September 2021 due to social issues as well as start of whole brain radiation.  Therefore it is possible that he may have had these liver lesions before we give him carbotaxol chemotherapy.  Regardless patient has had a tough time tolerating chemoradiation he did have a high PD-L1 expression of 70% on his NGS testing and single agent immunotherapy with Beryle Flock remains an option  Hypokalemia: He will receive 1 L of IV fluids today with 20 mEq of potassium.  He will again return  in 1 week with repeat BMP for possible IV fluids and I will see him back in 2 weeks with labs.  Depending on his performance status and oral intake I will decide to restart Keytruda at that time.  Treatment will be given with a palliative intent     Visit Diagnosis 1. Malignant neoplasm of lower lobe of right lung ([place])   2. Hypokalemia   3. Low sodium levels   4. Goals of care, counseling/discussion      Dr. Randa Evens, MD, MPH [facility] [phone] 11/22/2020 8:23 AM

## 2020-11-23 ENCOUNTER — Inpatient Hospital Stay: Payer: Medicare HMO

## 2020-12-01 ENCOUNTER — Inpatient Hospital Stay: Payer: Medicare HMO | Attending: Hospice and Palliative Medicine | Admitting: Hospice and Palliative Medicine

## 2020-12-01 ENCOUNTER — Other Ambulatory Visit: Payer: Self-pay

## 2020-12-01 DIAGNOSIS — Z87891 Personal history of nicotine dependence: Secondary | ICD-10-CM | POA: Diagnosis not present

## 2020-12-01 DIAGNOSIS — E43 Unspecified severe protein-calorie malnutrition: Secondary | ICD-10-CM | POA: Insufficient documentation

## 2020-12-01 DIAGNOSIS — E119 Type 2 diabetes mellitus without complications: Secondary | ICD-10-CM | POA: Diagnosis not present

## 2020-12-01 DIAGNOSIS — C3431 Malignant neoplasm of lower lobe, right bronchus or lung: Secondary | ICD-10-CM | POA: Insufficient documentation

## 2020-12-01 DIAGNOSIS — Z9221 Personal history of antineoplastic chemotherapy: Secondary | ICD-10-CM | POA: Insufficient documentation

## 2020-12-01 DIAGNOSIS — Z5112 Encounter for antineoplastic immunotherapy: Secondary | ICD-10-CM | POA: Insufficient documentation

## 2020-12-01 DIAGNOSIS — Z8673 Personal history of transient ischemic attack (TIA), and cerebral infarction without residual deficits: Secondary | ICD-10-CM | POA: Insufficient documentation

## 2020-12-01 DIAGNOSIS — C7931 Secondary malignant neoplasm of brain: Secondary | ICD-10-CM | POA: Insufficient documentation

## 2020-12-01 DIAGNOSIS — Z923 Personal history of irradiation: Secondary | ICD-10-CM | POA: Insufficient documentation

## 2020-12-01 DIAGNOSIS — E785 Hyperlipidemia, unspecified: Secondary | ICD-10-CM | POA: Insufficient documentation

## 2020-12-01 DIAGNOSIS — Q796 Ehlers-Danlos syndrome, unspecified: Secondary | ICD-10-CM | POA: Insufficient documentation

## 2020-12-01 DIAGNOSIS — I10 Essential (primary) hypertension: Secondary | ICD-10-CM | POA: Diagnosis not present

## 2020-12-01 DIAGNOSIS — Z833 Family history of diabetes mellitus: Secondary | ICD-10-CM | POA: Insufficient documentation

## 2020-12-01 DIAGNOSIS — Z79899 Other long term (current) drug therapy: Secondary | ICD-10-CM | POA: Diagnosis not present

## 2020-12-01 DIAGNOSIS — E876 Hypokalemia: Secondary | ICD-10-CM | POA: Insufficient documentation

## 2020-12-01 NOTE — Progress Notes (Signed)
[name]  [phone] [phone]   Name: Tyler Gallagher Date: 12/01/2020 MRN: [id number]  DOB: [birth year]  Patient Care Team: Juluis Pitch, MD as PCP - General (Family Medicine) Kate Sable, MD as PCP - Cardiology (Cardiology) Telford Nab, RN as Oncology Nurse Navigator    REASON FOR CONSULTATION: Tyler Gallagher is a 59 y.o. 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 Patient was recently found to have a cardiomyopathy with an EF of 30 to 35%. He has had ongoing chronic nausea and diarrhea throughout chemotherapy. Patient was hospitalized 11/08/2020 -11/10/2020 with severe vertigo. MRI of the brain revealed a small cluster of nodularity in the left frontal parietal cortex concerning for possible subacute infarcts. CT of the chest, abdomen, and pelvis revealed improvement in his primary lung nodule but new interval development of liver metastasis. He was referred to palliative care to help address goals and manage ongoing symptoms.  SOCIAL HISTORY:     reports that he quit smoking about 5 months ago. His smoking use included cigarettes. He has a 64.00 pack-year smoking history. He has never used smokeless tobacco. He reports previous drug use. Drug: Marijuana. He reports that he does not drink alcohol.  Patient lives at home with his girlfriend and son.  He also has a daughter who does not live in the home.  Patient previously worked in [employer] but is disabled due to his AKA.  ADVANCE DIRECTIVES:  Not on file  CODE STATUS: Full code  PAST MEDICAL HISTORY: Past Medical History:  Diagnosis Date  . Diabetes mellitus without complication ([place])   .  Hyperlipidemia   . Hypertension   . Lesion of brain   . Lung cancer ([place])   . Mass of lung     PAST SURGICAL HISTORY:  Past Surgical History:  Procedure Laterality Date  . 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 COLONOSCOPY    . CYSTOSCOPY W/ RETROGRADES Bilateral 07/14/2020   Procedure: CYSTOSCOPY WITH RETROGRADE PYELOGRAM;  Surgeon: Hollice Espy, MD;  Location: [facility];  Service: Urology;  Laterality: Bilateral;  . PORTA CATH INSERTION N/A 08/03/2020   Procedure: PORTA CATH INSERTION;  Surgeon: Katha Cabal, MD;  Location: [place] [facility];  Service: Cardiovascular;  Laterality: N/A;  . PORTA CATH INSERTION N/A 10/03/2020   Procedure: PORTA CATH INSERTION;  Surgeon: Algernon Huxley, MD;  Location: [place] [facility];  Service: Cardiovascular;  Laterality: N/A;  . VIDEO BRONCHOSCOPY WITH ENDOBRONCHIAL NAVIGATION N/A 07/01/2020   Procedure: VIDEO BRONCHOSCOPY WITH ENDOBRONCHIAL NAVIGATION;  Surgeon: [name], MD;  Location: [facility];  Service: Thoracic;  Laterality: N/A;  . VIDEO BRONCHOSCOPY WITH ENDOBRONCHIAL ULTRASOUND N/A 07/01/2020   Procedure: VIDEO BRONCHOSCOPY WITH ENDOBRONCHIAL ULTRASOUND;  Surgeon: [name], MD;  Location: [facility];  Service: Thoracic;  Laterality: N/A;    HEMATOLOGY/ONCOLOGY HISTORY:  Oncology History  Malignant neoplasm of lower lobe of right lung ([place])  07/07/2020 Cancer Staging   Staging form: Lung, AJCC 8th Edition - Clinical stage from 07/07/2020: Stage IVA (cT2a, cN0, cM1b) - Signed by [name], MD on 07/08/2020   07/08/2020 Initial Diagnosis   Malignant neoplasm of lower lobe of right lung ([place])   08/15/2020 -  Chemotherapy   The patient had dexamethasone (DECADRON)  4 MG tablet, 8 mg, Oral, Daily, 1 of 1 cycle, Start date: 07/26/2020, End date: 08/15/2020 palonosetron (ALOXI) injection 0.25 mg, 0.25 mg, Intravenous,  Once, 7 of 7 cycles Administration: 0.25 mg (08/15/2020), 0.25 mg (09/22/2020), 0.25 mg (09/29/2020), 0.25 mg (09/15/2020), 0.25  mg (10/06/2020), 0.25 mg (10/13/2020), 0.25 mg (10/20/2020) CARBOplatin (PARAPLATIN) 300 mg in sodium chloride 0.9 % 250 mL chemo infusion, 300 mg (100 % of original dose 300 mg), Intravenous,  Once, 7 of 7 cycles Dose modification:   (original dose 300 mg, Cycle 1) Administration: 300 mg (09/22/2020), 300 mg (09/29/2020), 300 mg (09/15/2020), 300 mg (10/06/2020), 300 mg (10/13/2020), 300 mg (10/20/2020) PACLitaxel (TAXOL) 186 mg in sodium chloride 0.9 % 250 mL chemo infusion (</= 80mg /m2), 80 mg/m2 = 186 mg, Intravenous,  Once, 7 of 7 cycles Dose modification: 65 mg/m2 (original dose 80 mg/m2, Cycle 5, Reason: Dose not tolerated) Administration: 186 mg (09/22/2020), 186 mg (09/29/2020), 186 mg (09/15/2020), 150 mg (10/06/2020), 150 mg (10/13/2020), 150 mg (10/20/2020)  for chemotherapy treatment.      ALLERGIES:  is allergic to penicillins.  MEDICATIONS:  Current Outpatient Medications  Medication Sig Dispense Refill  . metoprolol succinate (TOPROL-XL) 25 MG 24 hr tablet Take 0.5 tablets (12.5 mg total) by mouth daily. (Patient taking differently: Take 12.5 mg by mouth in the morning and at bedtime. ) 15 tablet 5  . potassium chloride SA (KLOR-CON) 20 MEQ tablet Take 1 tablet (20 mEq total) by mouth 2 (two) times daily. 28 tablet 0  . vancomycin (VANCOCIN) 50 mg/mL oral solution Take 2.5 mLs (125 mg total) by mouth 4 (four) times daily.     No current facility-administered medications for this visit.   Facility-Administered Medications Ordered in Other Visits  Medication Dose Route Frequency Provider Last Rate Last Admin  . heparin lock flush 100 unit/mL  500 Units Intravenous Once Charlaine Dalton R, MD      . sodium chloride flush (NS) 0.9 % injection 10 mL  10 mL Intravenous Once Cammie Sickle, MD        VITAL SIGNS: There were no vitals taken for this visit. There were no vitals filed for this visit.  Estimated body mass index is 23.84 kg/m as calculated from the following:    Height as of 11/15/20: 6\' 3"  (1.905 m).   Weight as of 11/18/20: 190 lb 11.2 oz (86.5 kg).  LABS: CBC:    Component Value Date/Time   WBC 4.3 11/18/2020 1255   HGB 12.5 (L) 11/18/2020 1255   HCT 35.5 (L) 11/18/2020 1255   PLT 231 11/18/2020 1255   MCV 89.2 11/18/2020 1255   NEUTROABS 3.1 11/18/2020 1255   LYMPHSABS 0.7 11/18/2020 1255   MONOABS 0.4 11/18/2020 1255   EOSABS 0.1 11/18/2020 1255   BASOSABS 0.0 11/18/2020 1255   Comprehensive Metabolic Panel:    Component Value Date/Time   NA 130 (L) 11/18/2020 1255   K 2.8 (L) 11/18/2020 1255   CL 92 (L) 11/18/2020 1255   CO2 26 11/18/2020 1255   BUN 5 (L) 11/18/2020 1255   CREATININE 0.61 11/18/2020 1255   GLUCOSE 164 (H) 11/18/2020 1255   CALCIUM 8.6 (L) 11/18/2020 1255   AST 22 11/18/2020 1255   ALT 18 11/18/2020 1255   ALKPHOS 80 11/18/2020 1255   BILITOT 0.9 11/18/2020 1255   PROT 7.1 11/18/2020 1255   ALBUMIN 3.1 (L) 11/18/2020 1255    RADIOGRAPHIC STUDIES: MR Brain W and Wo Contrast  Result Date: 11/08/2020 CLINICAL DATA:  Vertigo. History of lung cancer and brain metastases EXAM: MRI HEAD WITHOUT AND WITH CONTRAST TECHNIQUE: Multiplanar, multiecho pulse sequences of the brain and surrounding structures were obtained without and with intravenous contrast. CONTRAST:  16mL GADAVIST GADOBUTROL 1 MMOL/ML IV SOLN COMPARISON:  08/11/2020 FINDINGS: Brain: Recent left parafalcine resection at the vertex with significantly diminished, essentially resolved, enhancement. Punctate foci of enhancement clustered in the superior right frontal lobe are resolved since prior but there are is a new area of similar size punctate enhancement clustered left parietal cortex which are marked on 71:245 and adjacent slices. In retrospect there was a nodule of enhancement along the parasagittal left parietal cortex which has increased in size and now measures 5 mm, consistent with metastasis. Persistent and unchanged 2 mm focus of enhancement in the  anterior and medial right temporal lobe on 18:66. No acute infarct. Foci of restricted diffusion in the left cerebellum on prior have resolved. No hemorrhage, hydrocephalus, or masslike finding. 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 Vascular: Normal flow voids and vascular enhancements Skull and upper cervical spine: Unremarkable left-sided craniotomy. Sinuses/Orbits: Retention cyst in the right maxillary sinus and partial left mastoid opacification with negative nasopharynx. IMPRESSION: 1. No acute finding. 2. Diminished/resolved enhancement at the left parafalcine frontal lobe resection site. 3. 5 mm left parietal nodule which has slightly increased from a 08/11/2020 scan, consistent with metastatic disease. 4. Unchanged 2 mm of focus of enhancement in the medial right temporal lobe, also most consistent with metastasis. 5. On prior there was clustered punctate enhancing foci in the right frontal lobe which have resolved, favor subacute infarcts. Today there is a similar cluster of tiny nodular enhancement along the left frontal parietal cortex, which may also be subacute infarcts. Electronically Signed   By: Monte Fantasia M.D.   On: 11/08/2020 07:52   CT CHEST ABDOMEN PELVIS W CONTRAST  Result Date: 11/08/2020 CLINICAL DATA:  Metastatic small cell lung cancer. EXAM: CT CHEST, ABDOMEN, AND PELVIS WITH CONTRAST TECHNIQUE: Multidetector CT imaging of the chest, abdomen and pelvis was performed following the standard protocol during bolus administration of intravenous contrast. CONTRAST:  182mL OMNIPAQUE IOHEXOL 300 MG/ML  SOLN COMPARISON:  None. FINDINGS: CT CHEST FINDINGS Cardiovascular: The heart is normal in size. No pericardial effusion. 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 No dissection. The branch vessels are patent. Stable coronary artery calcifications. The pulmonary arteries are unremarkable. Mediastinum/Nodes:  No mediastinal or hilar mass or lymphadenopathy. The esophagus is grossly normal. Lungs/Pleura: Interval decrease in size of the right lower lobe pulmonary mass. It measures approximately 2.3 x 2.0 cm on image 103/4. It previously measured a maximum of 3.8 x 3.5 cm. No pulmonary nodules to suggest pulmonary metastatic disease. No significant inflammatory or infectious changes. No pleural effusions. Stable emphysematous changes and areas of pulmonary scarring. Musculoskeletal: The chest wall is unremarkable. No supraclavicular or axillary adenopathy. The thyroid gland appears normal. The bony thorax is intact. CT ABDOMEN PELVIS FINDINGS Hepatobiliary: Vague low-attenuation liver lesions highly suspicious for metastatic disease. 12.5 mm lesion is noted at the left hepatic dome in segment 2. 13 mm lesion at the hepatic dome in segment 8 on image 53/2. Other smaller right hepatic lesions are noted. No biliary dilatation. Layering gallstones are noted the gallbladder. The common bile duct is normal in caliber. Pancreas: No mass, inflammation or ductal dilatation. Spleen: Normal size. No focal lesions crash that normal size. Small low-attenuation lesion anteriorly  is likely a benign cyst. Adrenals/Urinary Tract: The adrenals are normal. No evidence of metastatic disease. No worrisome renal lesions or hydronephrosis. The bladder is unremarkable. Stomach/Bowel: The stomach, duodenum, small bowel and colon are unremarkable. No acute inflammatory changes, mass lesions or obstructive findings. The terminal ileum is normal. The appendix is normal. Vascular/Lymphatic: Advanced atherosclerotic calcifications involving the aorta and iliac arteries but no aneurysm. No mesenteric or retroperitoneal mass or adenopathy. Reproductive: The prostate gland and seminal vesicles are unremarkable. Other: No pelvic mass or adenopathy. No free pelvic fluid collections. No inguinal mass or adenopathy. No abdominal wall hernia or subcutaneous  lesions. Musculoskeletal: No significant bony findings. No worrisome bone lesions to suggest metastatic disease. IMPRESSION: 1. Interval decrease in size of the right lower lobe pulmonary mass. No mediastinal or hilar adenopathy. 2. No findings for pulmonary metastatic disease. 3. Vague low-attenuation liver lesions highly suspicious for metastatic disease. 4. No findings for abdominal/pelvic metastatic adenopathy. 5. Stable emphysematous changes and pulmonary scarring. 6. Advanced atherosclerotic calcifications involving the aorta and iliac arteries. 7. Cholelithiasis. 8. Emphysema and aortic atherosclerosis. Aortic Atherosclerosis (ICD10-I70.0) and Emphysema (ICD10-J43.9). Electronically Signed   By: Marijo Sanes M.D.   On: 11/08/2020 11:30   NM Myocar Multi W/Spect W/Wall Motion / EF  Result Date: 11/18/2020  There was no ST segment deviation noted during stress.  No T wave inversion was noted during stress.  This is a low risk study.  Nuclear stress EF: 44%.  No evidence of perfusion defects on stress images.  CT attenuation images show moderate coronary calcifications.     PERFORMANCE STATUS (ECOG) : 2 - Symptomatic, <50% confined to bed  Review of Systems Unless otherwise noted, a complete review of systems is negative.  Physical Exam General: NAD, in wheelchair Pulmonary: Unlabored Extremities: Left AKA Skin: no rashes Neurological: Weakness but otherwise nonfocal  IMPRESSION: Patient was scheduled for a virtual visit today but instead presented to the clinic to be seen.  He was accompanied by his significant other.  Patient says that since discharging home from the hospital he has slowly improved.  He denies any recent nausea or vomiting or vertigo.  He denies any pain.  He does have occasional shortness of breath but this is unchanged in characteristic.  Patient's primary symptomatic complaint is poor oral intake from change taste.  He says that he has an appetite but that  nothing tastes good.  He is drinking oral supplements with Ensure Clear and cannot tolerate milk-based supplements.  We again discussed the importance of high-calorie and high-protein foods.  He is actively followed by [facility] and I spoke with her today. She will see patient again next week.   We could consider adding an appetite stimulant although not sure that this would have significant benefit.   PLAN: -Continue current scope of treatment -RD to follow -Continue oral supplements/high-protein/high-calorie diet -Follow-up MyChart visit about a month   Patient expressed understanding and was in agreement with this plan. He also understands that He can call the clinic at any time with any questions, concerns, or complaints.     Time Total: 20 minutes  Visit consisted of counseling and education dealing with the complex and emotionally intense issues of symptom management and palliative care in the setting of serious and potentially life-threatening illness.Greater than 50%  of this time was spent counseling and coordinating care related to the above assessment and plan.  Signed by: Altha Harm, PhD, NP-C

## 2020-12-05 ENCOUNTER — Inpatient Hospital Stay: Payer: Medicare HMO

## 2020-12-05 ENCOUNTER — Other Ambulatory Visit: Payer: Self-pay | Admitting: *Deleted

## 2020-12-05 ENCOUNTER — Encounter: Payer: Self-pay | Admitting: Radiation Oncology

## 2020-12-05 ENCOUNTER — Encounter: Payer: Self-pay | Admitting: Oncology

## 2020-12-05 ENCOUNTER — Ambulatory Visit
Admission: RE | Admit: 2020-12-05 | Discharge: 2020-12-05 | Disposition: A | Discharge status: Home / Self Care | Payer: Medicare HMO | Source: Ambulatory Visit | Attending: Radiation Oncology | Admitting: Radiation Oncology

## 2020-12-05 ENCOUNTER — Inpatient Hospital Stay (HOSPITAL_BASED_OUTPATIENT_CLINIC_OR_DEPARTMENT_OTHER): Payer: Medicare HMO | Admitting: Oncology

## 2020-12-05 VITALS — BP 111/88 | HR 116 | Temp 97.6°F | Resp 16 | Wt 179.4 lb

## 2020-12-05 DIAGNOSIS — C7931 Secondary malignant neoplasm of brain: Secondary | ICD-10-CM | POA: Insufficient documentation

## 2020-12-05 DIAGNOSIS — C3431 Malignant neoplasm of lower lobe, right bronchus or lung: Secondary | ICD-10-CM | POA: Diagnosis not present

## 2020-12-05 DIAGNOSIS — R630 Anorexia: Secondary | ICD-10-CM | POA: Diagnosis not present

## 2020-12-05 DIAGNOSIS — Z923 Personal history of irradiation: Secondary | ICD-10-CM | POA: Diagnosis not present

## 2020-12-05 DIAGNOSIS — E876 Hypokalemia: Secondary | ICD-10-CM

## 2020-12-05 DIAGNOSIS — Z87891 Personal history of nicotine dependence: Secondary | ICD-10-CM | POA: Diagnosis not present

## 2020-12-05 DIAGNOSIS — E43 Unspecified severe protein-calorie malnutrition: Secondary | ICD-10-CM | POA: Diagnosis not present

## 2020-12-05 DIAGNOSIS — Z8673 Personal history of transient ischemic attack (TIA), and cerebral infarction without residual deficits: Secondary | ICD-10-CM | POA: Diagnosis not present

## 2020-12-05 DIAGNOSIS — Z7189 Other specified counseling: Secondary | ICD-10-CM

## 2020-12-05 DIAGNOSIS — E785 Hyperlipidemia, unspecified: Secondary | ICD-10-CM | POA: Diagnosis not present

## 2020-12-05 DIAGNOSIS — Z5112 Encounter for antineoplastic immunotherapy: Secondary | ICD-10-CM | POA: Diagnosis not present

## 2020-12-05 DIAGNOSIS — I10 Essential (primary) hypertension: Secondary | ICD-10-CM | POA: Diagnosis not present

## 2020-12-05 DIAGNOSIS — R110 Nausea: Secondary | ICD-10-CM | POA: Diagnosis not present

## 2020-12-05 LAB — CBC WITH DIFFERENTIAL/PLATELET
Abs Immature Granulocytes: 0.02 10*3/uL (ref 0.00–0.07)
Basophils Absolute: 0.1 10*3/uL (ref 0.0–0.1)
Basophils Relative: 1 %
Eosinophils Absolute: 0.2 10*3/uL (ref 0.0–0.5)
Eosinophils Relative: 4 %
HCT: 35.2 % — ABNORMAL LOW (ref 39.0–52.0)
Hemoglobin: 12.1 g/dL — ABNORMAL LOW (ref 13.0–17.0)
Immature Granulocytes: 0 %
Lymphocytes Relative: 13 %
Lymphs Abs: 0.7 10*3/uL (ref 0.7–4.0)
MCH: 31 pg (ref 26.0–34.0)
MCHC: 34.4 g/dL (ref 30.0–36.0)
MCV: 90.3 fL (ref 80.0–100.0)
Monocytes Absolute: 0.5 10*3/uL (ref 0.1–1.0)
Monocytes Relative: 10 %
Neutro Abs: 4 10*3/uL (ref 1.7–7.7)
Neutrophils Relative %: 72 %
Platelets: 328 10*3/uL (ref 150–400)
RBC: 3.9 MIL/uL — ABNORMAL LOW (ref 4.22–5.81)
RDW: 16.4 % — ABNORMAL HIGH (ref 11.5–15.5)
WBC: 5.6 10*3/uL (ref 4.0–10.5)
nRBC: 0 % (ref 0.0–0.2)

## 2020-12-05 LAB — COMPREHENSIVE METABOLIC PANEL
ALT: 18 U/L (ref 0–44)
AST: 21 U/L (ref 15–41)
Albumin: 3.2 g/dL — ABNORMAL LOW (ref 3.5–5.0)
Alkaline Phosphatase: 109 U/L (ref 38–126)
Anion gap: 13 (ref 5–15)
BUN: <5 mg/dL — ABNORMAL LOW (ref 6–20)
CO2: 24 mmol/L (ref 22–32)
Calcium: 9 mg/dL (ref 8.9–10.3)
Chloride: 96 mmol/L — ABNORMAL LOW (ref 98–111)
Creatinine, Ser: 0.51 mg/dL — ABNORMAL LOW (ref 0.61–1.24)
GFR, Estimated: >60 mL/min (ref >60)
Glucose, Bld: 148 mg/dL — ABNORMAL HIGH (ref 70–99)
Potassium: 2.9 mmol/L — ABNORMAL LOW (ref 3.5–5.1)
Sodium: 133 mmol/L — ABNORMAL LOW (ref 135–145)
Total Bilirubin: 0.8 mg/dL (ref 0.3–1.2)
Total Protein: 6.9 g/dL (ref 6.5–8.1)

## 2020-12-05 MED ORDER — SODIUM CHLORIDE 0.9% FLUSH
10.0000 mL | INTRAVENOUS | Status: DC | PRN
  Administered 2020-12-05: 10 mL via INTRAVENOUS
  Filled 2020-12-05: qty 10

## 2020-12-05 MED ORDER — POTASSIUM CHLORIDE IN NACL 20-0.9 MEQ/L-% IV SOLN
Freq: Once | INTRAVENOUS | Status: AC
  Filled 2020-12-05: qty 1000

## 2020-12-05 MED ORDER — DRONABINOL 5 MG PO CAPS: 5.0000 mg | ORAL_CAPSULE | Freq: Two times a day (BID) | ORAL | 0 refills | Status: DC

## 2020-12-05 MED ORDER — HEPARIN SOD (PORK) LOCK FLUSH 100 UNIT/ML IV SOLN
500.0000 [IU] | Freq: Once | INTRAVENOUS | Status: AC
  Administered 2020-12-05: 500 [IU] via INTRAVENOUS
  Filled 2020-12-05: qty 5

## 2020-12-05 MED ORDER — DRONABINOL 5 MG PO CAPS: 5.0000 mg | ORAL_CAPSULE | Freq: Two times a day (BID) | ORAL | 0 refills | Status: AC

## 2020-12-05 NOTE — Progress Notes (Signed)
In [facility] for scheduled IVF with potassium. Tolerated infusion well. Discharged via wheelchair. Going to radiation next for treatment.

## 2020-12-05 NOTE — Progress Notes (Signed)
Radiation Oncology Follow up Note  Name: Tyler Gallagher   Date:   12/05/2020 MRN:  [id number] DOB: [birth year]    This 59 y.o. male presents to the clinic today for 1 month follow-up status post radiation therapy to his brain for solitary brain metastasis status post resection and also radiation therapy with concurrent chemotherapy to his chest for stage IV adenocarcinoma the right lung.  REFERRING PROVIDER: Juluis Pitch, MD  HPI: Patient is a 59 year old male now at 1 month having completed radiation therapy to both his brain as well as his chest for stage IV adenocarcinoma the right lung seen today in routine follow-up he continues to be lethargic with poor appetite.Marland Kitchen  He did have a hospitalization for nausea which seemed to been improving.  He had a recent CT scan showing decrease in the size of the right lower lobe mass but he did have some low-attenuation lesions in the liver concerning for metastatic disease.  He did have a high PDL 1 expression at 70% and is scheduled to start [place] TREATMENT: none  FOLLOW UP COMPLIANCE: keeps appointments   PHYSICAL EXAM:  There were no vitals taken for this visit. Well-developed male with alopecia of his scalp.  No change in his neurologic status.  Well-developed well-nourished patient in NAD. HEENT reveals PERLA, EOMI, discs not visualized.  Oral cavity is clear. No oral mucosal lesions are identified. Neck is clear without evidence of cervical or supraclavicular adenopathy. Lungs are clear to A&P. Cardiac examination is essentially unremarkable with regular rate and rhythm without murmur rub or thrill. Abdomen is benign with no organomegaly or masses noted. Motor sensory and DTR levels are equal and symmetric in the upper and lower extremities. Cranial nerves II through XII are grossly intact. Proprioception is intact. No peripheral adenopathy or edema is identified. No motor or sensory levels are noted. Crude visual fields are  within normal range.  RADIOLOGY RESULTS: CT scans were is reviewed compatible with above-stated findings  PLAN: Present time patient is doing fair.  He is slated to start [place] therapy under medical oncology's direction.  I have asked to see him back in about 4 months for follow-up.  Patient is to call with any concerns at any time.  I have encouraged him to increase his nutritional intake.  I would like to take this opportunity to thank you for allowing me to participate in the care of your patient.Noreene Filbert, MD

## 2020-12-05 NOTE — Progress Notes (Signed)
DISCONTINUE OFF PATHWAY REGIMEN - Other   [id number]:Carboplatin + Paclitaxel (2/80) weekly:   Administer weekly:     Paclitaxel      Carboplatin   **Always confirm dose/schedule in your pharmacy ordering system**  REASON: Disease Progression PRIOR TREATMENT: Carboplatin + Paclitaxel (2/80) weekly TREATMENT RESPONSE: Progressive Disease (PD)  START OFF PATHWAY REGIMEN - Other   [id number]:Pembrolizumab 200 mg IV D1 q21 Days:   A cycle is every 21 days:     Pembrolizumab   **Always confirm dose/schedule in your pharmacy ordering system**  Patient Characteristics: Intent of Therapy: Non-Curative / Palliative Intent, Discussed with Patient

## 2020-12-05 NOTE — Progress Notes (Signed)
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 CTA showed no pulmonary embolism but a 3.8 x 3.8 cm medial right lower lobe mass abutting the right paravertebral pleural margin. No involvement of adjacent vertebral bodies and consistent with primary lung malignancy. He then presented to ER with symptoms of increasing weakness over the right side. He underwent MRI brain with and without contrast at [facility] which showed a solitary metastatic deposit in the left medial frontal lobe with surrounding vasogenic edema. Also had a CT abdomen which showed large area of heterogeneous attenuation within the lumen of the urinary bladder. While this may represent a large amount of blood products the presence of urinary bladder mass not excluded.Patient taken for cystoscopy and TURBT which did not show any bladder mass  PET CT scan showed 3.7 cm right lower lobe mass with an SUV of 30.7. The mass abuts the visceral pleura without definite pleural invasion. No hypermetabolic adenopathy or evidence of distant metastatic disease. Patient underwent bronchoscopy with [name] which showed non-small cell lung cancer favoradenocarcinoma  Patient underwent  resection of the solitary left frontal lobe brain mass which was also consistent with TTF-1 positive lung adenocarcinoma.However repeat MRI brain showed possible Concern for new brain metastases and therefore patient received whole brain radiation.  Plan is for weekly carbotaxol chemotherapy followed by maintenance durvalumab.Omniseqtesting showed high tumor mutational burden and PD-L1 of 70%. No other actionable mutations noted  Interval history-patient continues to report lack of taste sensation and does not have a desire to eat much of the solid food.  He is mainly relying on boost shakes for his calorie intake but continues to lose weight.  Denies any significant nausea or vomiting  ECOG PS- 2 Pain scale- 0 Opioid associated constipation- no  Review of systems- Review of Systems  Constitutional: Positive for malaise/fatigue. Negative for chills, fever and weight loss.       Lack of appetite and weight loss  HENT: Negative for congestion, ear discharge and nosebleeds.   Eyes: Negative for blurred vision.  Respiratory: Negative for cough, hemoptysis, sputum production, shortness of breath and wheezing.   Cardiovascular: Negative for chest pain, palpitations, orthopnea and claudication.  Gastrointestinal: Negative for abdominal pain, blood in stool, constipation, diarrhea, heartburn, melena, nausea and vomiting.  Genitourinary: Negative for dysuria, flank pain, frequency, hematuria and urgency.  Musculoskeletal: Negative for back pain, joint pain and myalgias.  Skin: Negative for rash.  Neurological: Negative for dizziness, tingling, focal weakness, seizures, weakness and headaches.  Endo/Heme/Allergies: Does not bruise/bleed easily.  Psychiatric/Behavioral: Negative for depression and suicidal ideas. The patient does not have insomnia.       Allergies  Allergen Reactions  . Penicillins Other (See Comments)    unknown     Past Medical History:  Diagnosis Date  . Diabetes  mellitus without complication ([place])   . Hyperlipidemia   . Hypertension   . Lesion of brain   . Lung cancer ([place])   . Mass of lung      Past Surgical History:  Procedure Laterality Date  . 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 COLONOSCOPY    . CYSTOSCOPY W/ RETROGRADES Bilateral 07/14/2020   Procedure: CYSTOSCOPY WITH RETROGRADE PYELOGRAM;  Surgeon: Hollice Espy, MD;  Location: [facility] OR[facility];  Service: Urology;  Laterality: Bilateral;  . PORTA CATH INSERTION N/A 08/03/2020   Procedure: PORTA CATH INSERTION;  Surgeon: Katha Cabal, MD;  Location: [place] [facility];  Service: Cardiovascular;  Laterality: N/A;  . PORTA CATH INSERTION N/A 10/03/2020   Procedure: PORTA CATH INSERTION;  Surgeon: Algernon Huxley, MD;  Location: [place] [facility];  Service: Cardiovascular;  Laterality: N/A;  . VIDEO BRONCHOSCOPY WITH ENDOBRONCHIAL NAVIGATION N/A 07/01/2020   Procedure: VIDEO BRONCHOSCOPY WITH ENDOBRONCHIAL NAVIGATION;  Surgeon: [name], MD;  Location: [facility] OR[facility];  Service: Thoracic;  Laterality: N/A;  . VIDEO BRONCHOSCOPY WITH ENDOBRONCHIAL ULTRASOUND N/A 07/01/2020   Procedure: VIDEO BRONCHOSCOPY WITH ENDOBRONCHIAL ULTRASOUND;  Surgeon: [name], MD;  Location: [facility] OR[facility];  Service: Thoracic;  Laterality: N/A;    Social History   Socioeconomic History  . Marital status: Divorced    Spouse name: Not on file  . Number of children: Not on file  . Years of education: Not on file  . Highest education level: Not on file  Occupational History  . Not on file  Tobacco Use  . Smoking status: Former Smoker    Packs/day: 2.00    Years: 32.00    Pack years: 64.00    Types: Cigarettes    Quit date: 06/21/2020    Years since quitting: 0.4  . Smokeless tobacco: Never Used  Vaping Use  . Vaping Use: Never used  Substance and Sexual Activity  . Alcohol use: No    Alcohol/week: 0.0 standard drinks  . Drug use: Not Currently    Types: Marijuana  . Sexual activity: Not on file  Other Topics  Concern  . Not on file  Social History Narrative   Lives at home with girlfriend   Social Determinants of Health   Financial Resource Strain:   . Difficulty of Paying Living Expenses: Not on file  Food Insecurity:   . Worried About Charity fundraiser in the Last Year: Not on file  . Ran Out of Food in the Last Year: Not on file  Transportation Needs:   . Lack of Transportation (Medical): Not on file  . Lack of Transportation (Non-Medical): Not on file  Physical Activity:   . Days of Exercise per Week: Not on file  . Minutes of Exercise per Session: Not on file  Stress:   . Feeling of Stress : Not on file  Social Connections:   . Frequency of Communication with Friends and Family: Not on file  . Frequency of Social Gatherings with Friends and Family: Not on file  . Attends Religious Services: Not on file  . Active Member of Clubs or Organizations: Not on file  . Attends Archivist Meetings: Not on file  . Marital Status: Not on file  Intimate Partner Violence:   . Fear of Current or Ex-Partner: Not on file  . Emotionally Abused: Not on file  . Physically Abused: Not on file  . Sexually Abused: Not on file    Family History  Problem Relation Age  of Onset  . Diabetes Sister   . Diabetes Brother      Current Outpatient Medications:  .  metoprolol succinate (TOPROL-XL) 25 MG 24 hr tablet, Take 0.5 tablets (12.5 mg total) by mouth daily. (Patient taking differently: Take 12.5 mg by mouth in the morning and at bedtime. ), Disp: 15 tablet, Rfl: 5 .  potassium chloride SA (KLOR-CON) 20 MEQ tablet, Take 1 tablet (20 mEq total) by mouth 2 (two) times daily., Disp: 28 tablet, Rfl: 0 .  dronabinol (MARINOL) 5 MG capsule, Take 1 capsule (5 mg total) by mouth 2 (two) times daily before a meal., Disp: 60 capsule, Rfl: 0 .  vancomycin (VANCOCIN) 50 mg/mL oral solution, Take 2.5 mLs (125 mg total) by mouth 4 (four) times daily., Disp: , Rfl:  No current facility-administered  medications for this visit.  Facility-Administered Medications Ordered in Other Visits:  .  heparin lock flush 100 unit/mL, 500 Units, Intravenous, Once, Brahmanday, Govinda R, MD .  sodium chloride flush (NS) 0.9 % injection 10 mL, 10 mL, Intravenous, Once, Cammie Sickle, MD  Physical exam:  Vitals:   12/05/20 1022  BP: 111/88  Pulse: (!) 116  Resp: 16  Temp: 97.6 F (36.4 C)  TempSrc: Oral  Weight: 179 lb 6.4 oz (81.4 kg)   Physical Exam Constitutional:      General: He is not in acute distress. Eyes:     Pupils: Pupils are equal, round, and reactive to light.  Cardiovascular:     Rate and Rhythm: Normal rate and regular rhythm.     Heart sounds: Normal heart sounds.  Pulmonary:     Effort: Pulmonary effort is normal.     Breath sounds: Normal breath sounds.  Abdominal:     General: Bowel sounds are normal.     Palpations: Abdomen is soft.  Skin:    General: Skin is warm and dry.  Neurological:     Mental Status: He is alert and oriented to person, place, and time.      CMP Latest Ref Rng & Units 12/05/2020  Glucose 70 - 99 mg/dL 148(H)  BUN 6 - 20 mg/dL <5(L)  Creatinine 0.61 - 1.24 mg/dL 0.51(L)  Sodium 135 - 145 mmol/L 133(L)  Potassium 3.5 - 5.1 mmol/L 2.9(L)  Chloride 98 - 111 mmol/L 96(L)  CO2 22 - 32 mmol/L 24  Calcium 8.9 - 10.3 mg/dL 9.0  Total Protein 6.5 - 8.1 g/dL 6.9  Total Bilirubin 0.3 - 1.2 mg/dL 0.8  Alkaline Phos 38 - 126 U/L 109  AST 15 - 41 U/L 21  ALT 0 - 44 U/L 18   CBC Latest Ref Rng & Units 12/05/2020  WBC 4.0 - 10.5 K/uL 5.6  Hemoglobin 13.0 - 17.0 g/dL 12.1(L)  Hematocrit 39 - 52 % 35.2(L)  Platelets 150 - 400 K/uL 328    No images are attached to the encounter.  MR Brain W and Wo Contrast  Result Date: 11/08/2020 CLINICAL DATA:  Vertigo. History of lung cancer and brain metastases EXAM: MRI HEAD WITHOUT AND WITH CONTRAST TECHNIQUE: Multiplanar, multiecho pulse sequences of the brain and surrounding structures were  obtained without and with intravenous contrast. CONTRAST:  42m GADAVIST GADOBUTROL 1 MMOL/ML IV SOLN COMPARISON:  08/11/2020 FINDINGS: Brain: Recent left parafalcine resection at the vertex with significantly diminished, essentially resolved, enhancement. Punctate foci of enhancement clustered in the superior right frontal lobe are resolved since prior but there are is a new area of similar size punctate enhancement clustered  left parietal cortex which are marked on 81:448 and adjacent slices. In retrospect there was a nodule of enhancement along the parasagittal left parietal cortex which has increased in size and now measures 5 mm, consistent with metastasis. Persistent and unchanged 2 mm focus of enhancement in the anterior and medial right temporal lobe on 18:66. No acute infarct. Foci of restricted diffusion in the left cerebellum on prior have resolved. No hemorrhage, hydrocephalus, or masslike finding. 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 Vascular: Normal flow voids and vascular enhancements Skull and upper cervical spine: Unremarkable left-sided craniotomy. Sinuses/Orbits: Retention cyst in the right maxillary sinus and partial left mastoid opacification with negative nasopharynx. IMPRESSION: 1. No acute finding. 2. Diminished/resolved enhancement at the left parafalcine frontal lobe resection site. 3. 5 mm left parietal nodule which has slightly increased from a 08/11/2020 scan, consistent with metastatic disease. 4. Unchanged 2 mm of focus of enhancement in the medial right temporal lobe, also most consistent with metastasis. 5. On prior there was clustered punctate enhancing foci in the right frontal lobe which have resolved, favor subacute infarcts. Today there is a similar cluster of tiny nodular enhancement along the left frontal parietal cortex, which may also be subacute infarcts. Electronically Signed   By: Monte Fantasia M.D.   On: 11/08/2020 07:52   CT CHEST ABDOMEN  PELVIS W CONTRAST  Result Date: 11/08/2020 CLINICAL DATA:  Metastatic small cell lung cancer. EXAM: CT CHEST, ABDOMEN, AND PELVIS WITH CONTRAST TECHNIQUE: Multidetector CT imaging of the chest, abdomen and pelvis was performed following the standard protocol during bolus administration of intravenous contrast. CONTRAST:  177m OMNIPAQUE IOHEXOL 300 MG/ML  SOLN COMPARISON:  None. FINDINGS: CT CHEST FINDINGS Cardiovascular: The heart is normal in size. No pericardial effusion. 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 No dissection. The branch vessels are patent. Stable coronary artery calcifications. The pulmonary arteries are unremarkable. Mediastinum/Nodes: No mediastinal or hilar mass or lymphadenopathy. The esophagus is grossly normal. Lungs/Pleura: Interval decrease in size of the right lower lobe pulmonary mass. It measures approximately 2.3 x 2.0 cm on image 103/4. It previously measured a maximum of 3.8 x 3.5 cm. No pulmonary nodules to suggest pulmonary metastatic disease. No significant inflammatory or infectious changes. No pleural effusions. Stable emphysematous changes and areas of pulmonary scarring. Musculoskeletal: The chest wall is unremarkable. No supraclavicular or axillary adenopathy. The thyroid gland appears normal. The bony thorax is intact. CT ABDOMEN PELVIS FINDINGS Hepatobiliary: Vague low-attenuation liver lesions highly suspicious for metastatic disease. 12.5 mm lesion is noted at the left hepatic dome in segment 2. 13 mm lesion at the hepatic dome in segment 8 on image 53/2. Other smaller right hepatic lesions are noted. No biliary dilatation. Layering gallstones are noted the gallbladder. The common bile duct is normal in caliber. Pancreas: No mass, inflammation or ductal dilatation. Spleen: Normal size. No focal lesions crash that normal size. Small low-attenuation lesion anteriorly is likely a benign cyst. Adrenals/Urinary  Tract: The adrenals are normal. No evidence of metastatic disease. No worrisome renal lesions or hydronephrosis. The bladder is unremarkable. Stomach/Bowel: The stomach, duodenum, small bowel and colon are unremarkable. No acute inflammatory changes, mass lesions or obstructive findings. The terminal ileum is normal. The appendix is normal. Vascular/Lymphatic: Advanced atherosclerotic calcifications involving the aorta and iliac arteries but no aneurysm. No mesenteric or retroperitoneal mass or adenopathy. Reproductive: The prostate gland and seminal vesicles are unremarkable. Other: No pelvic mass or  adenopathy. No free pelvic fluid collections. No inguinal mass or adenopathy. No abdominal wall hernia or subcutaneous lesions. Musculoskeletal: No significant bony findings. No worrisome bone lesions to suggest metastatic disease. IMPRESSION: 1. Interval decrease in size of the right lower lobe pulmonary mass. No mediastinal or hilar adenopathy. 2. No findings for pulmonary metastatic disease. 3. Vague low-attenuation liver lesions highly suspicious for metastatic disease. 4. No findings for abdominal/pelvic metastatic adenopathy. 5. Stable emphysematous changes and pulmonary scarring. 6. Advanced atherosclerotic calcifications involving the aorta and iliac arteries. 7. Cholelithiasis. 8. Emphysema and aortic atherosclerosis. Aortic Atherosclerosis (ICD10-I70.0) and Emphysema (ICD10-J43.9). Electronically Signed   By: Marijo Sanes M.D.   On: 11/08/2020 11:30   NM Myocar Multi W/Spect W/Wall Motion / EF  Result Date: 11/18/2020  There was no ST segment deviation noted during stress.  No T wave inversion was noted during stress.  This is a low risk study.  Nuclear stress EF: 44%.  No evidence of perfusion defects on stress images.  CT attenuation images show moderate coronary calcifications.      Assessment and plan- Patient is a 59 y.o. male with stage IVacT2 cN0 cM1 adenocarcinoma of the right lung  with isolated left frontal lobe metastasess/p resection of brain met.This was followed by whole brain radiation as there was concern for new areas of brain metastases.  Patient is s/p whole brain radiation as well as concurrent chemoradiation for his lung.  He is here for further management of lung cancer  Patient continues to have ongoing weight loss as food does not taste good for him.  I will give him a trial of Marinol 5 mg twice daily and see if it would help.  He will continue his protein shakes as well.  He was also seen by Herb Grays today.  Consideration for feeding tube may have to be given if he does not stabilize with regards to his weight loss.  Hypokalemia: Patient states that he is unable to take 2 tablets of potassium daily.  He will continue with 20 mg daily and we will give him IV potassium today as well.  Most recent CT scan showed Improvement in right lower lobe lung mass and no evidence of mediastinal hilar hilar adenopathy but low-attenuation liver lesions concerning for metastatic disease.  It is unclear if patient had these lesions in that time interval between the initial PET scan start of treatment.  PD-L1 expression is 70% indicating good response to immunotherapy.  We discussed proceeding with immunotherapy at this time and depending on his performance status if he is unable to tolerate it we will discuss best supportive care/hospice discussed risks and benefits of immunotherapy including all but not limited to autoimmune side effect such as pneumonitis colitis thyroiditis and need to monitor kidney and liver functions.  Treatment will be given with a palliative intent.  Patient understands and agrees to proceed as planned.  I will tentatively see him back in 1 to 2 weeks time to start first cycle of Keytruda   Visit Diagnosis 1. Hypokalemia   2. Malignant neoplasm of lower lobe of right lung ([place])   3. Severe protein-calorie malnutrition ([place])      Dr. Randa Evens,  MD, MPH [facility] [phone] 12/05/2020 4:20 PM

## 2020-12-05 NOTE — Progress Notes (Signed)
Nutrition Follow-up:  Patient with stage IV lung cancer.  Patient has completed chemotherapy and radiation therapy.  Planning to start immunotherapy next week.   Spoke with patient via phone, missed seeing him in clinic.  Patient reports that his appetite is still down due to taste alterations.  Reports nausea is better.  Got ham biscuit this am and too salty.  Ate some fries from [facility] today and was able to get a few down.  Has been eating some ice cream sandwiches.  Drinking ensure clear shakes.  Using plastic utensils.      Medications: marionl being added  Labs: reviewed  Anthropometrics:   Weight 179 lb 6.4 oz  Decreased from 210 lb on 11/1 213 lb on 9/30   NUTRITION DIAGNOSIS: Inadequate oral intake continues   INTERVENTION:  Reviewed taste change strategies (flavoring of foods, mouth rinse, etc) Encouraged patient to try boost soothe (300 calories) as higher in calories than ensure clear.  Patient says it is in his refrigerator.   Consider feeding tube placement as patient with significant weight loss, taste alterations, poor appetite.       MONITORING, EVALUATION, GOAL: weight trends, intake   NEXT VISIT: Dec 16 during infusion  Daria Mcmeekin B. Zenia Resides, [place], [place] Registered Dietitian [phone] (mobile)

## 2020-12-05 NOTE — Progress Notes (Signed)
Pt still having taste issues and not able to eat very much due to taste. He is drinking the juice version of boost.4 cans a day per pt. Some HA at times and does not take anything for it.

## 2020-12-08 ENCOUNTER — Inpatient Hospital Stay: Payer: Medicare HMO | Admitting: Nurse Practitioner

## 2020-12-08 ENCOUNTER — Ambulatory Visit: Payer: Medicare HMO

## 2020-12-08 ENCOUNTER — Ambulatory Visit: Payer: Medicare HMO | Admitting: Oncology

## 2020-12-08 ENCOUNTER — Other Ambulatory Visit: Payer: Medicare HMO

## 2020-12-15 ENCOUNTER — Inpatient Hospital Stay: Payer: Medicare HMO

## 2020-12-15 ENCOUNTER — Inpatient Hospital Stay (HOSPITAL_BASED_OUTPATIENT_CLINIC_OR_DEPARTMENT_OTHER): Payer: Medicare HMO | Admitting: Oncology

## 2020-12-15 ENCOUNTER — Encounter: Payer: Self-pay | Admitting: Oncology

## 2020-12-15 VITALS — BP 97/76 | HR 97 | Temp 96.8°F | Wt 179.8 lb

## 2020-12-15 DIAGNOSIS — I10 Essential (primary) hypertension: Secondary | ICD-10-CM | POA: Diagnosis not present

## 2020-12-15 DIAGNOSIS — Z5112 Encounter for antineoplastic immunotherapy: Secondary | ICD-10-CM

## 2020-12-15 DIAGNOSIS — C3431 Malignant neoplasm of lower lobe, right bronchus or lung: Secondary | ICD-10-CM | POA: Diagnosis not present

## 2020-12-15 DIAGNOSIS — E43 Unspecified severe protein-calorie malnutrition: Secondary | ICD-10-CM | POA: Diagnosis not present

## 2020-12-15 DIAGNOSIS — E876 Hypokalemia: Secondary | ICD-10-CM

## 2020-12-15 DIAGNOSIS — Z8673 Personal history of transient ischemic attack (TIA), and cerebral infarction without residual deficits: Secondary | ICD-10-CM | POA: Diagnosis not present

## 2020-12-15 DIAGNOSIS — E785 Hyperlipidemia, unspecified: Secondary | ICD-10-CM | POA: Diagnosis not present

## 2020-12-15 DIAGNOSIS — Z87891 Personal history of nicotine dependence: Secondary | ICD-10-CM | POA: Diagnosis not present

## 2020-12-15 DIAGNOSIS — C7931 Secondary malignant neoplasm of brain: Secondary | ICD-10-CM | POA: Diagnosis not present

## 2020-12-15 LAB — COMPREHENSIVE METABOLIC PANEL
ALT: 13 U/L (ref 0–44)
AST: 22 U/L (ref 15–41)
Albumin: 2.9 g/dL — ABNORMAL LOW (ref 3.5–5.0)
Alkaline Phosphatase: 115 U/L (ref 38–126)
Anion gap: 8 (ref 5–15)
BUN: <5 mg/dL — ABNORMAL LOW (ref 6–20)
CO2: 27 mmol/L (ref 22–32)
Calcium: 8.6 mg/dL — ABNORMAL LOW (ref 8.9–10.3)
Chloride: 95 mmol/L — ABNORMAL LOW (ref 98–111)
Creatinine, Ser: 0.49 mg/dL — ABNORMAL LOW (ref 0.61–1.24)
GFR, Estimated: >60 mL/min (ref >60)
Glucose, Bld: 165 mg/dL — ABNORMAL HIGH (ref 70–99)
Potassium: 2.9 mmol/L — ABNORMAL LOW (ref 3.5–5.1)
Sodium: 130 mmol/L — ABNORMAL LOW (ref 135–145)
Total Bilirubin: 0.8 mg/dL (ref 0.3–1.2)
Total Protein: 6.7 g/dL (ref 6.5–8.1)

## 2020-12-15 LAB — CBC WITH DIFFERENTIAL/PLATELET
Abs Immature Granulocytes: 0.01 10*3/uL (ref 0.00–0.07)
Basophils Absolute: 0.1 10*3/uL (ref 0.0–0.1)
Basophils Relative: 1 %
Eosinophils Absolute: 0.4 10*3/uL (ref 0.0–0.5)
Eosinophils Relative: 7 %
HCT: 36 % — ABNORMAL LOW (ref 39.0–52.0)
Hemoglobin: 12 g/dL — ABNORMAL LOW (ref 13.0–17.0)
Immature Granulocytes: 0 %
Lymphocytes Relative: 12 %
Lymphs Abs: 0.8 10*3/uL (ref 0.7–4.0)
MCH: 30.5 pg (ref 26.0–34.0)
MCHC: 33.3 g/dL (ref 30.0–36.0)
MCV: 91.4 fL (ref 80.0–100.0)
Monocytes Absolute: 0.5 10*3/uL (ref 0.1–1.0)
Monocytes Relative: 8 %
Neutro Abs: 4.7 10*3/uL (ref 1.7–7.7)
Neutrophils Relative %: 72 %
Platelets: 289 10*3/uL (ref 150–400)
RBC: 3.94 MIL/uL — ABNORMAL LOW (ref 4.22–5.81)
RDW: 15.4 % (ref 11.5–15.5)
WBC: 6.4 10*3/uL (ref 4.0–10.5)
nRBC: 0 % (ref 0.0–0.2)

## 2020-12-15 LAB — TSH: TSH: 3.277 u[IU]/mL (ref 0.350–4.500)

## 2020-12-15 MED ORDER — SODIUM CHLORIDE 0.9% FLUSH
10.0000 mL | Freq: Once | INTRAVENOUS | Status: AC
  Administered 2020-12-15: 09:00:00 10 mL via INTRAVENOUS
  Filled 2020-12-15: qty 10

## 2020-12-15 MED ORDER — HEPARIN SOD (PORK) LOCK FLUSH 100 UNIT/ML IV SOLN
INTRAVENOUS | Status: AC
  Filled 2020-12-15: qty 5

## 2020-12-15 MED ORDER — SODIUM CHLORIDE 0.9 % IV SOLN
200.0000 mg | Freq: Once | INTRAVENOUS | Status: AC
  Administered 2020-12-15: 12:00:00 200 mg via INTRAVENOUS
  Filled 2020-12-15: qty 8

## 2020-12-15 MED ORDER — POTASSIUM CHLORIDE IN NACL 20-0.9 MEQ/L-% IV SOLN
INTRAVENOUS | Status: AC
  Filled 2020-12-15: qty 1000

## 2020-12-15 MED ORDER — HEPARIN SOD (PORK) LOCK FLUSH 100 UNIT/ML IV SOLN
500.0000 [IU] | Freq: Once | INTRAVENOUS | Status: AC | PRN
  Administered 2020-12-15: 12:00:00 500 [IU]
  Filled 2020-12-15: qty 5

## 2020-12-15 MED ORDER — SODIUM CHLORIDE 0.9% FLUSH
10.0000 mL | INTRAVENOUS | Status: DC | PRN
  Administered 2020-12-15 (×2): 10 mL
  Filled 2020-12-15: qty 10

## 2020-12-15 MED ORDER — SODIUM CHLORIDE 0.9 % IV SOLN
Freq: Once | INTRAVENOUS | Status: AC
  Filled 2020-12-15: qty 250

## 2020-12-15 MED ORDER — HEPARIN SOD (PORK) LOCK FLUSH 100 UNIT/ML IV SOLN
500.0000 [IU] | Freq: Once | INTRAVENOUS | Status: DC
  Filled 2020-12-15: qty 5

## 2020-12-15 NOTE — Progress Notes (Signed)
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 CTA showed no pulmonary embolism but a 3.8 x 3.8 cm medial right lower lobe mass abutting the right paravertebral pleural margin. No involvement of adjacent vertebral bodies and consistent with primary lung malignancy. He then presented to ER with symptoms of increasing weakness over the right side. He underwent MRI brain with and without contrast at [facility] which showed a solitary metastatic deposit in the left medial frontal lobe with surrounding vasogenic edema. Also had a CT abdomen which showed large area of heterogeneous attenuation within the lumen of the urinary bladder. While this may represent a large amount of blood products the presence of urinary bladder mass not excluded.Patient taken for cystoscopy and TURBT which did not show any bladder mass  PET CT scan showed 3.7 cm right lower lobe mass with an SUV of 30.7. The mass abuts the visceral pleura without definite pleural invasion. No hypermetabolic adenopathy or evidence of distant metastatic disease. Patient underwent bronchoscopy with [name] which showed non-small cell lung cancer favoradenocarcinoma  Patient  underwent resection of the solitary left frontal lobe brain mass which was also consistent with TTF-1 positive lung adenocarcinoma.However repeat MRI brain showed possible Concern for new brain metastases and therefore patient received whole brain radiation.  Plan is for weekly carbotaxol chemotherapy followed by maintenance durvalumab.Omniseqtesting showed high tumor mutational burden and PD-L1 of 70%. No other actionable mutations noted Repeat scan showed improvement in the lung lesion but possible liver metastases.  Given poor tolerance to chemotherapy plans to proceed with single agent Keytruda.  Interval history-patient states that his taste sensation is improving but still not better yet.  He is yet to pick up Marinol prescription.  He is drinking about 1-2 ensures a day.  Denies any pain  ECOG PS- 2 Pain scale- 0 Opioid associated constipation- no  Review of systems- Review of Systems  Constitutional: Positive for malaise/fatigue. Negative for chills, fever and weight loss.       Lack of taste sensation  HENT: Negative for congestion, ear discharge and nosebleeds.   Eyes: Negative for blurred vision.  Respiratory: Negative for cough, hemoptysis, sputum production, shortness of breath and wheezing.   Cardiovascular: Negative for chest pain, palpitations, orthopnea and claudication.  Gastrointestinal: Negative for abdominal pain, blood in stool, constipation, diarrhea, heartburn, melena, nausea and vomiting.  Genitourinary: Negative for dysuria, flank pain, frequency, hematuria and urgency.  Musculoskeletal: Negative for back pain, joint pain and myalgias.  Skin: Negative for rash.  Neurological: Negative for dizziness, tingling, focal weakness, seizures, weakness and headaches.  Endo/Heme/Allergies: Does not bruise/bleed easily.  Psychiatric/Behavioral: Negative for depression and suicidal ideas. The patient does not have insomnia.        Allergies  Allergen Reactions  .  Penicillins Other (See  Comments)    unknown     Past Medical History:  Diagnosis Date  . Diabetes mellitus without complication ([place])   . Hyperlipidemia   . Hypertension   . Lesion of brain   . Lung cancer ([place])   . Mass of lung      Past Surgical History:  Procedure Laterality Date  . 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 COLONOSCOPY    . CYSTOSCOPY W/ RETROGRADES Bilateral 07/14/2020   Procedure: CYSTOSCOPY WITH RETROGRADE PYELOGRAM;  Surgeon: Hollice Espy, MD;  Location: [facility] ORS;  Service: Urology;  Laterality: Bilateral;  . PORTA CATH INSERTION N/A 08/03/2020   Procedure: PORTA CATH INSERTION;  Surgeon: Katha Cabal, MD;  Location: [place] CV LAB;  Service: Cardiovascular;  Laterality: N/A;  . PORTA CATH INSERTION N/A 10/03/2020   Procedure: PORTA CATH INSERTION;  Surgeon: Algernon Huxley, MD;  Location: [place] CV LAB;  Service: Cardiovascular;  Laterality: N/A;  . VIDEO BRONCHOSCOPY WITH ENDOBRONCHIAL NAVIGATION N/A 07/01/2020   Procedure: VIDEO BRONCHOSCOPY WITH ENDOBRONCHIAL NAVIGATION;  Surgeon: [name], MD;  Location: [facility] ORS;  Service: Thoracic;  Laterality: N/A;  . VIDEO BRONCHOSCOPY WITH ENDOBRONCHIAL ULTRASOUND N/A 07/01/2020   Procedure: VIDEO BRONCHOSCOPY WITH ENDOBRONCHIAL ULTRASOUND;  Surgeon: [name], MD;  Location: [facility] ORS;  Service: Thoracic;  Laterality: N/A;    Social History   Socioeconomic History  . Marital status: Divorced    Spouse name: Not on file  . Number of children: Not on file  . Years of education: Not on file  . Highest education level: Not on file  Occupational History  . Not on file  Tobacco Use  . Smoking status: Former Smoker    Packs/day: 2.00    Years: 32.00    Pack years: 64.00    Types: Cigarettes    Quit date: 06/21/2020    Years since quitting: 0.4  . Smokeless tobacco: Never Used  Vaping Use  . Vaping Use: Never used  Substance and Sexual Activity  . Alcohol use: No    Alcohol/week: 0.0  standard drinks  . Drug use: Not Currently    Types: Marijuana  . Sexual activity: Not on file  Other Topics Concern  . Not on file  Social History Narrative   Lives at home with girlfriend   Social Determinants of Health   Financial Resource Strain: Not on file  Food Insecurity: Not on file  Transportation Needs: Not on file  Physical Activity: Not on file  Stress: Not on file  Social Connections: Not on file  Intimate Partner Violence: Not on file    Family History  Problem Relation Age of Onset  . Diabetes Sister   . Diabetes Brother      Current Outpatient Medications:  .  dronabinol (MARINOL) 5 MG capsule, Take 1 capsule (5 mg total) by mouth 2 (two) times daily before a meal., Disp: 60 capsule, Rfl: 0 .  metoprolol succinate (TOPROL-XL) 25 MG 24 hr tablet, Take 0.5 tablets (12.5 mg total) by mouth daily. (Patient taking differently: Take 12.5 mg by mouth in the morning and at bedtime.), Disp: 15 tablet, Rfl: 5 .  potassium chloride SA (KLOR-CON) 20 MEQ tablet, Take 1 tablet (20 mEq total) by mouth 2 (two) times daily., Disp: 28 tablet, Rfl: 0 .  atorvastatin (LIPITOR) 10 MG tablet, , Disp: , Rfl:  .  gemfibrozil (LOPID) 600 MG tablet, , Disp: , Rfl:  .  lisinopril-hydrochlorothiazide (ZESTORETIC) 10-12.5 MG tablet, , Disp: ,  Rfl:  .  vancomycin (VANCOCIN) 50 mg/mL oral solution, Take 2.5 mLs (125 mg total) by mouth 4 (four) times daily. (Patient not taking: Reported on 12/15/2020), Disp: , Rfl:  No current facility-administered medications for this visit.  Facility-Administered Medications Ordered in Other Visits:  .  heparin lock flush 100 unit/mL, 500 Units, Intravenous, Once, Brahmanday, Govinda R, MD .  heparin lock flush 100 unit/mL, 500 Units, Intravenous, Once, [name], MD .  sodium chloride flush (NS) 0.9 % injection 10 mL, 10 mL, Intravenous, Once, Brahmanday, Lenetta Quaker R, MD .  sodium chloride flush (NS) 0.9 % injection 10 mL, 10 mL, Intracatheter, PRN,  [name], MD, 10 mL at 12/15/20 1220  Physical exam:  Vitals:   12/15/20 0918  BP: 97/76  Pulse: 97  Temp: (!) 96.8 F (36 C)  TempSrc: Tympanic  SpO2: 100%  Weight: 179 lb 12.8 oz (81.6 kg)   Physical Exam   CMP Latest Ref Rng & Units 12/15/2020  Glucose 70 - 99 mg/dL 165(H)  BUN 6 - 20 mg/dL <5(L)  Creatinine 0.61 - 1.24 mg/dL 0.49(L)  Sodium 135 - 145 mmol/L 130(L)  Potassium 3.5 - 5.1 mmol/L 2.9(L)  Chloride 98 - 111 mmol/L 95(L)  CO2 22 - 32 mmol/L 27  Calcium 8.9 - 10.3 mg/dL 8.6(L)  Total Protein 6.5 - 8.1 g/dL 6.7  Total Bilirubin 0.3 - 1.2 mg/dL 0.8  Alkaline Phos 38 - 126 U/L 115  AST 15 - 41 U/L 22  ALT 0 - 44 U/L 13   CBC Latest Ref Rng & Units 12/15/2020  WBC 4.0 - 10.5 K/uL 6.4  Hemoglobin 13.0 - 17.0 g/dL 12.0(L)  Hematocrit 39.0 - 52.0 % 36.0(L)  Platelets 150 - 400 K/uL 289    No images are attached to the encounter.  NM Myocar Multi W/Spect W/Wall Motion / EF  Result Date: 11/18/2020  There was no ST segment deviation noted during stress.  No T wave inversion was noted during stress.  This is a low risk study.  Nuclear stress EF: 44%.  No evidence of perfusion defects on stress images.  CT attenuation images show moderate coronary calcifications.      Assessment and plan- Patient is a 59 y.o. male with stage IVacT2 cN0 cM1 adenocarcinoma of the right lung with isolated left frontal lobe metastasess/p resection of brain met.This was followed by whole brain radiation as there was concern for new areas of brain metastases.Patient is s/p whole brain radiation as well as concurrent chemoradiation for his lung.  Patient now has possible liver metastases and is here for on treatment assessment prior to cycle 1 of Keytruda  Plans okay to proceed with cycle 1 of Keytruda today.  He will receive 1 L of IV fluids today along with 20 mEq of IV potassium.  He will be seen in 10 days time for possible IV fluids and potassium by NP Altha Harm.  We did discuss his ongoing lack of days and thereby appetite.  He has tried olanzapine in the past which did not work for him.  I did send him a prescription for Marinol 5 mg twice daily which she has not yet picked up from the pharmacy.  I have encouraged him to try that to see if his appetite and taste will improve.  This would need to be assessed further at his next visit.  His weight today has stabilized at 179 pounds and has not gone down further in the last 2  weeks.  I will see him back in 3 weeks time with CBC with differential and CMP for cycle 2 of Keytruda.  Hypokalemia: Patient is only able to take one potassium pill a day due to nausea associated with   Visit Diagnosis 1. Encounter for antineoplastic immunotherapy   2. Malignant neoplasm of lower lobe of right lung ([place])      Dr. Randa Evens, MD, MPH [facility] at [facility] [phone] 12/15/2020 3:26 PM

## 2020-12-15 NOTE — Progress Notes (Signed)
Tolerated pembrolizumab well. KCl 78meq in 1 liter NS given per orders for K 2.8. Discharged home in stable condition.

## 2020-12-15 NOTE — Progress Notes (Addendum)
Nutrition Follow-up:  Patient with stage IV lung cancer.  Patient has completed chemotherapy and radiation therapy.  Patient starting immunotherapy today.    Met with patient during infusion.  Patient reports that appetite is maybe a little bit better. Has not picked up marinol but will start today.  Reports can't tolerate meat due to metallic taste in his mouth.  Has been eating mainly ice cream sandwiches, drinking 2-4 ensure clear (180 calories, 9 g protein), water, pepsi, banana.      Medications: reviewed  Labs: K 2.9, Na 130 glucose 165  Anthropometrics:   Weight 179 lb 12.8 oz today stable from 179 lb 6.4 oz on 12/6  210 lb on 11/1 213 on 9/30  Kcals: 2400-2800 Protein: 120-140 g Fluid: > 2 L  NUTRITION DIAGNOSIS: Inadequate oral intake continues   MALNUTRITION DIAGNOSIS: Patient meets criteria for severe malnutrition in acute illness likely progressing to chronic as evidenced by 15% weight loss in the last month and eating < or equal to 50% of estimated energy needs for > or equal to 5 days   INTERVENTION:  Recommend consideration of feeding tube placement as oral intake continues to be poor due to taste change, decrease appetite. Discussed briefly with patient today and will send message to provider Gave sample of ensure clear therapeutic (240 calories, 9 g protein) to try and discussed can order on [facility].  Does not like milky shakes that are higher in calories.  Encouraged patient to pick up marinol today.        MONITORING, EVALUATION, GOAL: weight trends, intake   NEXT VISIT: Jan 6 during infusion  Everleigh Colclasure B. Zenia Resides, [place], [place] Registered Dietitian [phone] (mobile)

## 2020-12-16 LAB — T4: T4, Total: 7.8 ug/dL (ref 4.5–12.0)

## 2020-12-19 ENCOUNTER — Ambulatory Visit: Payer: Medicare HMO | Admitting: Cardiology

## 2020-12-20 ENCOUNTER — Encounter: Payer: Self-pay | Admitting: Cardiology

## 2020-12-22 ENCOUNTER — Inpatient Hospital Stay: Payer: Medicare HMO

## 2020-12-22 VITALS — BP 103/78 | HR 99 | Temp 97.6°F | Resp 17

## 2020-12-22 DIAGNOSIS — C7931 Secondary malignant neoplasm of brain: Secondary | ICD-10-CM | POA: Diagnosis not present

## 2020-12-22 DIAGNOSIS — Z8673 Personal history of transient ischemic attack (TIA), and cerebral infarction without residual deficits: Secondary | ICD-10-CM | POA: Diagnosis not present

## 2020-12-22 DIAGNOSIS — E43 Unspecified severe protein-calorie malnutrition: Secondary | ICD-10-CM | POA: Diagnosis not present

## 2020-12-22 DIAGNOSIS — E871 Hypo-osmolality and hyponatremia: Secondary | ICD-10-CM

## 2020-12-22 DIAGNOSIS — C3431 Malignant neoplasm of lower lobe, right bronchus or lung: Secondary | ICD-10-CM | POA: Diagnosis not present

## 2020-12-22 DIAGNOSIS — Z5112 Encounter for antineoplastic immunotherapy: Secondary | ICD-10-CM | POA: Diagnosis not present

## 2020-12-22 DIAGNOSIS — E785 Hyperlipidemia, unspecified: Secondary | ICD-10-CM | POA: Diagnosis not present

## 2020-12-22 DIAGNOSIS — E876 Hypokalemia: Secondary | ICD-10-CM

## 2020-12-22 DIAGNOSIS — I10 Essential (primary) hypertension: Secondary | ICD-10-CM | POA: Diagnosis not present

## 2020-12-22 DIAGNOSIS — E860 Dehydration: Secondary | ICD-10-CM

## 2020-12-22 DIAGNOSIS — Z87891 Personal history of nicotine dependence: Secondary | ICD-10-CM | POA: Diagnosis not present

## 2020-12-22 LAB — BASIC METABOLIC PANEL
Anion gap: 11 (ref 5–15)
BUN: <5 mg/dL — ABNORMAL LOW (ref 6–20)
CO2: 25 mmol/L (ref 22–32)
Calcium: 8.5 mg/dL — ABNORMAL LOW (ref 8.9–10.3)
Chloride: 96 mmol/L — ABNORMAL LOW (ref 98–111)
Creatinine, Ser: 0.46 mg/dL — ABNORMAL LOW (ref 0.61–1.24)
GFR, Estimated: >60 mL/min (ref >60)
Glucose, Bld: 111 mg/dL — ABNORMAL HIGH (ref 70–99)
Potassium: 2.8 mmol/L — ABNORMAL LOW (ref 3.5–5.1)
Sodium: 132 mmol/L — ABNORMAL LOW (ref 135–145)

## 2020-12-22 MED ORDER — POTASSIUM CHLORIDE IN NACL 40-0.9 MEQ/L-% IV SOLN: Freq: Once | INTRAVENOUS | Status: DC

## 2020-12-22 MED ORDER — HEPARIN SOD (PORK) LOCK FLUSH 100 UNIT/ML IV SOLN
500.0000 [IU] | Freq: Once | INTRAVENOUS | Status: AC
  Administered 2020-12-22: 13:00:00 500 [IU] via INTRAVENOUS
  Filled 2020-12-22: qty 5

## 2020-12-22 MED ORDER — SODIUM CHLORIDE 0.9% FLUSH
10.0000 mL | INTRAVENOUS | Status: DC | PRN
  Administered 2020-12-22: 09:00:00 10 mL via INTRAVENOUS
  Filled 2020-12-22: qty 10

## 2020-12-22 MED ORDER — SODIUM CHLORIDE 0.9 % IV SOLN: Freq: Once | INTRAVENOUS | Status: DC

## 2020-12-22 MED ORDER — POTASSIUM CHLORIDE IN NACL 40-0.9 MEQ/L-% IV SOLN
Freq: Once | INTRAVENOUS | Status: AC
  Filled 2020-12-22: qty 1000

## 2020-12-22 MED ORDER — SODIUM CHLORIDE 0.9 % IV SOLN
Freq: Once | INTRAVENOUS | Status: AC
  Filled 2020-12-22: qty 250

## 2020-12-22 NOTE — Progress Notes (Signed)
Pt has aport. OK to run 40 Potassium over 2 hours. Pt feels weak and has no appetite. He has been feeling this way for months now. Pt tolerated infusion well. No complaints at time of discharge. Accompanied by girlfriend. Transported by wheelchair to lobby.

## 2020-12-22 NOTE — Progress Notes (Signed)
Patient tolerated infusion well in [facility] today, port deaccessed, no concerns voiced. Patient discharged with significant other. Stable.

## 2020-12-26 ENCOUNTER — Telehealth: Payer: Self-pay

## 2020-12-26 ENCOUNTER — Inpatient Hospital Stay: Payer: Medicare HMO | Admitting: Hospice and Palliative Medicine

## 2021-01-04 NOTE — Progress Notes (Unsigned)
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 CTA showed no pulmonary embolism but a 3.8 x 3.8 cm medial right lower lobe mass abutting the right paravertebral pleural margin. No involvement of adjacent vertebral bodies and consistent with primary lung malignancy. He then presented to ER with symptoms of increasing weakness over the right side. He underwent MRI brain with and without contrast at [facility] which showed a solitary metastatic deposit in the left medial frontal lobe with surrounding vasogenic edema. Also had a CT abdomen which showed large area of heterogeneous attenuation within the lumen of the urinary bladder. While this may represent a large amount of blood products the presence of urinary bladder mass not excluded.Patient taken for cystoscopy and TURBT which did not show any bladder mass  PET CT scan showed 3.7 cm right lower lobe mass with an SUV of 30.7. The mass abuts the visceral pleura without definite pleural invasion. No hypermetabolic adenopathy or evidence of distant metastatic disease. Patient underwent bronchoscopy with [name] which showed non-small cell lung cancer favoradenocarcinoma  Patient  underwent resection of the solitary left frontal lobe brain mass which was also consistent with TTF-1 positive lung adenocarcinoma.However repeat MRI brain showed possible Concern for new brain metastases and therefore patient received whole brain radiation.  Plan is for weekly carbotaxol chemotherapy followed by maintenance durvalumab.Omniseqtesting showed high tumor mutational burden and PD-L1 of 70%. No other actionable mutations noted Repeat scan showed improvement in the lung lesion but possible liver metastases.  Given poor tolerance to chemotherapy plans to proceed with single agent Keytruda.  Interval history-continues to report poor taste sensation and oral intake continues to be poor.  His weight is down to 174 pounds from 179 pounds.  He is unable to take more than one potassium pill a day as he feels nauseous after that.  Reports feeling lightheaded occasionally but has not had any syncopal episodes or falls.  No new numbness or weakness in his extremities.  ECOG PS- 2 Pain scale- 0   Review of systems- Review of Systems  Constitutional: Positive for malaise/fatigue and weight loss. Negative for chills and fever.  HENT: Negative for congestion, ear discharge and nosebleeds.   Eyes: Negative for blurred vision.  Respiratory: Negative for cough, hemoptysis, sputum production, shortness of breath and wheezing.   Cardiovascular: Negative for chest pain, palpitations, orthopnea and claudication.  Gastrointestinal: Positive for nausea. Negative for abdominal pain, blood in stool, constipation, diarrhea, heartburn, melena and vomiting.       Lack of taste sensation  Genitourinary: Negative for dysuria, flank pain, frequency, hematuria and urgency.  Musculoskeletal: Negative for back pain, joint pain and myalgias.  Skin: Negative for rash.  Neurological: Negative for dizziness, tingling, focal weakness, seizures, weakness and headaches.  Endo/Heme/Allergies: Does not bruise/bleed  easily.  Psychiatric/Behavioral: Negative for depression and suicidal ideas. The patient does not have insomnia.      Allergies  Allergen Reactions  . Penicillins Other (See Comments)    unknown     Past Medical History:  Diagnosis Date  . Diabetes mellitus without complication ([facility])   . Hyperlipidemia   . Hypertension   . Lesion of brain   . Lung cancer ([facility])   . Mass of lung      Past Surgical History:  Procedure Laterality Date  . 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 COLONOSCOPY    . CYSTOSCOPY W/ RETROGRADES Bilateral 07/14/2020   Procedure: CYSTOSCOPY WITH RETROGRADE PYELOGRAM;  Surgeon: Brandon, Ashley, MD;  Location: [facility] ORS;  Service: Urology;  Laterality: Bilateral;  . PORTA CATH INSERTION N/A 08/03/2020   Procedure: PORTA CATH INSERTION;  Surgeon: Schnier, Gregory G, MD;  Location: [facility] INV[facility]IVE [facility] LAB;  Service: Cardiovascular;  Laterality: N/A;  . PORTA CATH INSERTION N/A 10/03/2020   Procedure: PORTA CATH INSERTION;  Surgeon: Dew, Jason S, MD;  Location: [facility];  Service: Cardiovascular;  Laterality: N/A;  . VIDEO BRONCHOSCOPY WITH ENDOBRONCHIAL NAVIGATION N/A 07/01/2020   Procedure: VIDEO BRONCHOSCOPY WITH ENDOBRONCHIAL NAVIGATION;  Surgeon: Aleskerov, Fuad, MD;  Location: [facility] ORS;  Service: Thoracic;  Laterality: N/A;  . VIDEO BRONCHOSCOPY WITH ENDOBRONCHIAL ULTRASOUND N/A 07/01/2020   Procedure: VIDEO BRONCHOSCOPY WITH ENDOBRONCHIAL ULTRASOUND;  Surgeon: Aleskerov, Fuad, MD;  Location: [facility] ORS;  Service: Thoracic;  Laterality: N/A;    Social History   Socioeconomic History  . Marital status: Divorced    Spouse name: Not on file  . Number of children: Not on file  . Years of education: Not on file  . Highest education level: Not on file  Occupational History  . Not on file  Tobacco Use  . Smoking status: Former Smoker    Packs/day: 2.00    Years: 32.00    Pack years: 64.00    Types: Cigarettes    Quit date: 06/21/2020    Years since quitting:  0.5  . Smokeless tobacco: Never Used  Vaping Use  . Vaping Use: Never used  Substance and Sexual Activity  . Alcohol use: No    Alcohol/week: 0.0 standard drinks  . Drug use: Not Currently    Types: Marijuana  . Sexual activity: Not on file  Other Topics Concern  . Not on file  Social History Narrative   Lives at home with girlfriend   Social Determinants of Health   Financial Resource Strain: Not on file  Food Insecurity: Not on file  Transportation Needs: Not on file  Physical Activity: Not on file  Stress: Not on file  Social Connections: Not on file  Intimate Partner Violence: Not on file    Family History  Problem Relation Age of Onset  . Diabetes Sister   . Diabetes Brother      Current Outpatient Medications:  .  atorvastatin (LIPITOR) 10 MG tablet, , Disp: , Rfl:  .  dronabinol (MARINOL) 5 MG capsule, Take 1 capsule (5 mg total) by mouth 2 (two) times daily before a meal., Disp: 60 capsule, Rfl: 0 .  gemfibrozil (LOPID) 600 MG tablet, , Disp: , Rfl:  .  lisinopril-hydrochlorothiazide (ZESTORETIC) 10-12.5 MG tablet, , Disp: , Rfl:  .  metoprolol succinate (TOPROL-XL) 25 MG 24 hr tablet, Take 0.5 tablets (12.5 mg total) by mouth daily. (Patient taking differently: Take 12.5 mg by mouth in the morning and at bedtime.), Disp: 15   tablet, Rfl: 5 .  potassium chloride SA (KLOR-CON) 20 MEQ tablet, Take 1 tablet (20 mEq total) by mouth 2 (two) times daily., Disp: 28 tablet, Rfl: 0 .  vancomycin (VANCOCIN) 50 mg/mL oral solution, Take 2.5 mLs (125 mg total) by mouth 4 (four) times daily. (Patient not taking: Reported on 12/15/2020), Disp: , Rfl:  No current facility-administered medications for this visit.  Facility-Administered Medications Ordered in Other Visits:  .  heparin lock flush 100 unit/mL, 500 Units, Intravenous, Once, Brahmanday, Govinda R, MD .  sodium chloride flush (NS) 0.9 % injection 10 mL, 10 mL, Intravenous, Once, Brahmanday, Govinda R, MD  Physical  exam:  Vitals:   01/05/21 0920  BP: 98/75  Pulse: (!) 112  Resp: 18  Temp: (!) 95.9 F (35.5 C)  TempSrc: Tympanic  SpO2: 99%  Weight: 174 lb 9.6 oz (79.2 kg)   Physical Exam Constitutional:      Comments: He is sitting in a wheelchair and appears fatigued  HENT:     Head: Atraumatic.  Eyes:     Extraocular Movements: EOM normal.  Cardiovascular:     Rate and Rhythm: Regular rhythm. Tachycardia present.     Heart sounds: Normal heart sounds.  Pulmonary:     Effort: Pulmonary effort is normal.     Breath sounds: Normal breath sounds.  Abdominal:     General: Bowel sounds are normal.     Palpations: Abdomen is soft.  Musculoskeletal:     Cervical back: Normal range of motion.     Comments: S/p left AKA  Skin:    General: Skin is warm and dry.  Neurological:     Mental Status: He is alert and oriented to person, place, and time.      CMP Latest Ref Rng & Units 12/22/2020  Glucose 70 - 99 mg/dL 111(H)  BUN 6 - 20 mg/dL <5(L)  Creatinine 0.61 - 1.24 mg/dL 0.46(L)  Sodium 135 - 145 mmol/L 132(L)  Potassium 3.5 - 5.1 mmol/L 2.8(L)  Chloride 98 - 111 mmol/L 96(L)  CO2 22 - 32 mmol/L 25  Calcium 8.9 - 10.3 mg/dL 8.5(L)  Total Protein 6.5 - 8.1 g/dL -  Total Bilirubin 0.3 - 1.2 mg/dL -  Alkaline Phos 38 - 126 U/L -  AST 15 - 41 U/L -  ALT 0 - 44 U/L -   CBC Latest Ref Rng & Units 12/15/2020  WBC 4.0 - 10.5 K/uL 6.4  Hemoglobin 13.0 - 17.0 g/dL 12.0(L)  Hematocrit 39.0 - 52.0 % 36.0(L)  Platelets 150 - 400 K/uL 289    Assessment and plan- Patient is a 59 y.o. male with stage IVacT2 cN0 cM1 adenocarcinoma of the right lung with isolated left frontal lobe metastasess/p resection of brain met.This was followed by whole brain radiation as there was concern for new areas of brain metastases.Patient is s/p whole brain radiation as well as concurrent chemoradiation for his lung.  He is here for on treatment assessment prior to cycle 2 of Keytruda  Counts okay to  proceed with cycle 2 of Keytruda today.  I will see him back in 3 weeks with port labs CBC with differential, CMP for cycle 3 of Keytruda.  Plan to repeat scans after 4 cycles  Hypokalemia: We will give him 40 mEq of IV potassium over 2 hours today.  He will come back again in 1 week and 2 weeks for labs and possible potassium infusion.  Abnormal weight loss: It has been greater than 3 months   since stopping concurrent chemoradiation and patient continues to report poor taste sensation and lack of adequate oral intake.  He continues to lose weight.  We discussed proceeding with feeding tube at this time to meet his nutritional intake and electrolyte disturbances.  Patient is agreeable.  I will refer him to Dr. Pabon for the same.  I have also contacted our dietitian Jollie Allen for this.   Visit Diagnosis 1. Encounter for antineoplastic immunotherapy   2. Malignant neoplasm of lower lobe of right lung ([facility])   3. Hypokalemia   4. Abnormal weight loss      Dr.  , MD, MPH [facility] at [facility] [phone] 01/04/2021 10:12 PM                

## 2021-01-05 ENCOUNTER — Inpatient Hospital Stay (HOSPITAL_BASED_OUTPATIENT_CLINIC_OR_DEPARTMENT_OTHER): Payer: Medicare HMO | Admitting: Oncology

## 2021-01-05 ENCOUNTER — Encounter: Payer: Self-pay | Admitting: Oncology

## 2021-01-05 ENCOUNTER — Inpatient Hospital Stay: Payer: Medicare HMO | Attending: Oncology

## 2021-01-05 ENCOUNTER — Inpatient Hospital Stay: Payer: Medicare HMO

## 2021-01-05 ENCOUNTER — Telehealth: Payer: Self-pay | Admitting: Surgery

## 2021-01-05 ENCOUNTER — Other Ambulatory Visit: Payer: Self-pay | Admitting: *Deleted

## 2021-01-05 ENCOUNTER — Inpatient Hospital Stay (HOSPITAL_BASED_OUTPATIENT_CLINIC_OR_DEPARTMENT_OTHER): Payer: Medicare HMO | Admitting: Hospice and Palliative Medicine

## 2021-01-05 VITALS — BP 98/75 | HR 112 | Temp 95.9°F | Resp 18 | Wt 174.6 lb

## 2021-01-05 DIAGNOSIS — Z5112 Encounter for antineoplastic immunotherapy: Secondary | ICD-10-CM | POA: Diagnosis not present

## 2021-01-05 DIAGNOSIS — C3431 Malignant neoplasm of lower lobe, right bronchus or lung: Secondary | ICD-10-CM

## 2021-01-05 DIAGNOSIS — Z9221 Personal history of antineoplastic chemotherapy: Secondary | ICD-10-CM | POA: Insufficient documentation

## 2021-01-05 DIAGNOSIS — E876 Hypokalemia: Secondary | ICD-10-CM

## 2021-01-05 DIAGNOSIS — R630 Anorexia: Secondary | ICD-10-CM

## 2021-01-05 DIAGNOSIS — Z923 Personal history of irradiation: Secondary | ICD-10-CM | POA: Insufficient documentation

## 2021-01-05 DIAGNOSIS — I10 Essential (primary) hypertension: Secondary | ICD-10-CM | POA: Diagnosis not present

## 2021-01-05 DIAGNOSIS — Z7952 Long term (current) use of systemic steroids: Secondary | ICD-10-CM | POA: Insufficient documentation

## 2021-01-05 DIAGNOSIS — R569 Unspecified convulsions: Secondary | ICD-10-CM | POA: Insufficient documentation

## 2021-01-05 DIAGNOSIS — C3411 Malignant neoplasm of upper lobe, right bronchus or lung: Secondary | ICD-10-CM | POA: Diagnosis present

## 2021-01-05 DIAGNOSIS — Z515 Encounter for palliative care: Secondary | ICD-10-CM

## 2021-01-05 DIAGNOSIS — R634 Abnormal weight loss: Secondary | ICD-10-CM | POA: Diagnosis not present

## 2021-01-05 DIAGNOSIS — E860 Dehydration: Secondary | ICD-10-CM | POA: Insufficient documentation

## 2021-01-05 DIAGNOSIS — E119 Type 2 diabetes mellitus without complications: Secondary | ICD-10-CM | POA: Diagnosis not present

## 2021-01-05 DIAGNOSIS — Z79899 Other long term (current) drug therapy: Secondary | ICD-10-CM | POA: Insufficient documentation

## 2021-01-05 DIAGNOSIS — C7931 Secondary malignant neoplasm of brain: Secondary | ICD-10-CM | POA: Insufficient documentation

## 2021-01-05 DIAGNOSIS — Z87891 Personal history of nicotine dependence: Secondary | ICD-10-CM | POA: Insufficient documentation

## 2021-01-05 LAB — CBC WITH DIFFERENTIAL/PLATELET
Abs Immature Granulocytes: 0.03 10*3/uL (ref 0.00–0.07)
Basophils Absolute: 0.1 10*3/uL (ref 0.0–0.1)
Basophils Relative: 1 %
Eosinophils Absolute: 0.9 10*3/uL — ABNORMAL HIGH (ref 0.0–0.5)
Eosinophils Relative: 10 %
HCT: 36.5 % — ABNORMAL LOW (ref 39.0–52.0)
Hemoglobin: 12.3 g/dL — ABNORMAL LOW (ref 13.0–17.0)
Immature Granulocytes: 0 %
Lymphocytes Relative: 11 %
Lymphs Abs: 1 10*3/uL (ref 0.7–4.0)
MCH: 29.9 pg (ref 26.0–34.0)
MCHC: 33.7 g/dL (ref 30.0–36.0)
MCV: 88.8 fL (ref 80.0–100.0)
Monocytes Absolute: 0.7 10*3/uL (ref 0.1–1.0)
Monocytes Relative: 8 %
Neutro Abs: 6.4 10*3/uL (ref 1.7–7.7)
Neutrophils Relative %: 70 %
Platelets: 340 10*3/uL (ref 150–400)
RBC: 4.11 MIL/uL — ABNORMAL LOW (ref 4.22–5.81)
RDW: 14.4 % (ref 11.5–15.5)
WBC: 9 10*3/uL (ref 4.0–10.5)
nRBC: 0 % (ref 0.0–0.2)

## 2021-01-05 LAB — COMPREHENSIVE METABOLIC PANEL
ALT: 21 U/L (ref 0–44)
AST: 29 U/L (ref 15–41)
Albumin: 2.7 g/dL — ABNORMAL LOW (ref 3.5–5.0)
Alkaline Phosphatase: 166 U/L — ABNORMAL HIGH (ref 38–126)
Anion gap: 11 (ref 5–15)
BUN: <5 mg/dL — ABNORMAL LOW (ref 6–20)
CO2: 28 mmol/L (ref 22–32)
Calcium: 8.5 mg/dL — ABNORMAL LOW (ref 8.9–10.3)
Chloride: 93 mmol/L — ABNORMAL LOW (ref 98–111)
Creatinine, Ser: 0.54 mg/dL — ABNORMAL LOW (ref 0.61–1.24)
GFR, Estimated: >60 mL/min (ref >60)
Glucose, Bld: 193 mg/dL — ABNORMAL HIGH (ref 70–99)
Potassium: 2.4 mmol/L — CL (ref 3.5–5.1)
Sodium: 132 mmol/L — ABNORMAL LOW (ref 135–145)
Total Bilirubin: 0.8 mg/dL (ref 0.3–1.2)
Total Protein: 6.7 g/dL (ref 6.5–8.1)

## 2021-01-05 LAB — TSH: TSH: 2.981 u[IU]/mL (ref 0.350–4.500)

## 2021-01-05 MED ORDER — POTASSIUM CHLORIDE IN NACL 40-0.9 MEQ/L-% IV SOLN
Freq: Once | INTRAVENOUS | Status: AC
  Filled 2021-01-05: qty 1000

## 2021-01-05 MED ORDER — HEPARIN SOD (PORK) LOCK FLUSH 100 UNIT/ML IV SOLN
500.0000 [IU] | Freq: Once | INTRAVENOUS | Status: AC | PRN
  Administered 2021-01-05: 500 [IU]
  Filled 2021-01-05: qty 5

## 2021-01-05 MED ORDER — SODIUM CHLORIDE 0.9 % IV SOLN
200.0000 mg | Freq: Once | INTRAVENOUS | Status: AC
  Administered 2021-01-05: 200 mg via INTRAVENOUS
  Filled 2021-01-05: qty 8

## 2021-01-05 MED ORDER — SODIUM CHLORIDE 0.9% FLUSH
10.0000 mL | INTRAVENOUS | Status: DC | PRN
  Administered 2021-01-05: 10 mL
  Filled 2021-01-05: qty 10

## 2021-01-05 MED ORDER — SODIUM CHLORIDE 0.9 % IV SOLN
Freq: Once | INTRAVENOUS | Status: AC
  Filled 2021-01-05: qty 250

## 2021-01-05 MED ORDER — HEPARIN SOD (PORK) LOCK FLUSH 100 UNIT/ML IV SOLN
INTRAVENOUS | Status: AC
  Filled 2021-01-05: qty 5

## 2021-01-05 NOTE — Telephone Encounter (Signed)
Outgoing call is made again.  Spoke with patient, he is aware of all dates regarding his surgery scheduled with Dr. Dahlia Byes for 01/12/21 and voices understanding.

## 2021-01-05 NOTE — Telephone Encounter (Signed)
Outgoing call is made, left message for patient to call.  Please advise patient of Pre-Admission date/time, COVID Testing date and Surgery date.  Surgery Date: 01/12/21 Preadmission Testing Date: 01/10/21 (phone 87a-1p) Covid Testing Date: 01/11/21 - patient advised to go to the [facility] ([place] between 8a-11:00 am, needs earlier due to day prior to surgery.    Also patient to call [phone], between 1-3:00pm the day before surgery, to find out what time to arrive for surgery.

## 2021-01-05 NOTE — Progress Notes (Signed)
Virtual Visit via Telephone Note  I connected with Tyler Gallagher on 01/05/21 at  2:00 PM EST by telephone and verified that I am speaking with the correct person using two identifiers.  Location: Patient: Home Provider: Clinic   I discussed the limitations, risks, security and privacy concerns of performing an evaluation and management service by telephone and the availability of in person appointments. I also discussed with the patient that there may be a patient responsible charge related to this service. The patient expressed understanding and agreed to proceed.   History of Present Illness: Tyler Gallagher is a 60 y.o. 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 Patient was recently found to have a cardiomyopathy with an EF of 30 to 35%. He has had ongoing chronic nausea and diarrhea throughout chemotherapy. Patient was hospitalized 11/08/2020 -11/10/2020 with severe vertigo. MRI of the brain revealed a small cluster of nodularity in the left frontal parietal cortex concerning for possible subacute infarcts. CT of the chest, abdomen, and pelvis revealed improvement in his primary lung nodule but new interval development of liver metastasis. He was referred to palliative care to help address goals and manage ongoing symptoms.   Observations/Objective: Called spoke with patient by phone.  He reports that he is doing about the same.  Appetite remains poor.  However, he thinks he is drinking plenty of fluids.  Patient is pending PEG insertion next week.  His goals are still aligned with continued treatment.  Symptomatically, patient denied any distressing symptoms at present.  No issues with medications nor need for refills today.  I encouraged him  to call the clinic with any changes or concerns.  Assessment and Plan: Stage IV adenocarcinoma of the lung -on treatment with pembrolizumab.  Followed by [name].  Patient is pending PEG next week.  Is also followed by a nutritionist.  Follow Up Instructions: Follow-up MyChart visit 1 to 2 months   I discussed the assessment and treatment plan with the patient. The patient was provided an opportunity to ask questions and all were answered. The patient agreed with the plan and demonstrated an understanding of the instructions.   The patient was advised to call back or seek an in-person evaluation if the symptoms worsen or if the condition fails to improve as anticipated.  I provided 5 minutes of non-face-to-face time during this encounter.   Irean Hong, NP

## 2021-01-05 NOTE — Progress Notes (Signed)
HR 112 ok to proceed per MD

## 2021-01-05 NOTE — Progress Notes (Signed)
KCl 70meq in 1 liter NS, Keytruda well tolerated. Discharged home in stable condition.

## 2021-01-05 NOTE — Progress Notes (Addendum)
Nutrition Follow-up:  Patient with lung cancer, stage IV.  Patient has completed chemotherapy and radiation therapy.  Patient receiving [place].    Dr Janese Banks spoke with RD and planning PEG placement (next week) due to ongoing weight loss, poor taste.   Met with patient today in infusion.  Patient reports that appetite continues to be poor.  "I am not eating enough to matter."  Continues to complain of taste alterations.  Strategies that have been discussed have not worked for patient.  Patient planning to proceed with PEG placement.  "I got to do something."    Medications: reviewed  Labs: K 2.4, glucose 193  Anthropometrics:    Weight 174 lb today decreased from 179 lb  210 on 11/1 213 lb on 9/30  18% weight loss in the last 3 months, significant   Estimated Energy Needs  Kcals: 2400-2800 Protein: 120-140 g Fluid: > 2 L  NUTRITION DIAGNOSIS: Inadequate oral intake continues   MALNUTRITION DIAGNOSIS: Patient meets criteria for severe malnutrition in context of chronic illness due to 17% weight loss in the last 3 months and eating less than 75% of estimated energy needs for greater than or equal to 1 month  INTERVENTION:  Patient to proceed with feeding tube placement.  Sample tube shown to patient today and demonstrated how it works.   Recommend isosource 1.5, 7 cartons per day (2 cartons at 8 am, noon and 4 pm feeding) and 1 carton at 8pm feeding).  Flush with 60 ml of water before and after each feeding (QID). Patient will need to drink or give via tube additional 3 cups (219m TID between feedings) water for better hydration. Tube feeding provides 2625 calories, 119 g protein, 25312mfree water (includes formula and water). Meets 100% of nutritional needs.  Recommend starting at 1/2 cartons 4 times per day and adding 1/2 carton of formula each day until reaches goal rate.  Titration can occur quicker pending results of refeeding labs.   Patient at risk of refeeding syndrome.   Requested Mag and phosphorus to be drawn today via secure chat to MD, RaJanese Banksnd supplemented if needed.  Patient will need refeeding labs checked once tube feeding started.  Patient agreeable to LiGirardroviding enteral supplies.  RD will contact representative.   Patient has RD contact information    MONITORING, EVALUATION, GOAL: weight trends, intake, tube feeding tolerance   NEXT VISIT: Monday, Jan 10 during infusion  Minas Bonser B. AlZenia ResidesRDRolling Hills EstatesLDPrince's Lakesegistered Dietitian [phone])

## 2021-01-06 ENCOUNTER — Other Ambulatory Visit: Payer: Self-pay

## 2021-01-06 ENCOUNTER — Encounter (HOSPITAL_COMMUNITY): Payer: Self-pay

## 2021-01-06 ENCOUNTER — Emergency Department (HOSPITAL_COMMUNITY): Payer: Medicare HMO

## 2021-01-06 ENCOUNTER — Observation Stay (HOSPITAL_COMMUNITY)
Admission: EM | Admit: 2021-01-06 | Discharge: 2021-01-08 | Disposition: A | Discharge status: Home / Self Care | Payer: Medicare HMO | Attending: Internal Medicine | Admitting: Internal Medicine

## 2021-01-06 DIAGNOSIS — I110 Hypertensive heart disease with heart failure: Secondary | ICD-10-CM | POA: Insufficient documentation

## 2021-01-06 DIAGNOSIS — R000 Tachycardia, unspecified: Secondary | ICD-10-CM | POA: Diagnosis not present

## 2021-01-06 DIAGNOSIS — Z79899 Other long term (current) drug therapy: Secondary | ICD-10-CM | POA: Diagnosis not present

## 2021-01-06 DIAGNOSIS — E119 Type 2 diabetes mellitus without complications: Secondary | ICD-10-CM | POA: Diagnosis not present

## 2021-01-06 DIAGNOSIS — R2689 Other abnormalities of gait and mobility: Secondary | ICD-10-CM | POA: Insufficient documentation

## 2021-01-06 DIAGNOSIS — C3431 Malignant neoplasm of lower lobe, right bronchus or lung: Secondary | ICD-10-CM

## 2021-01-06 DIAGNOSIS — I5022 Chronic systolic (congestive) heart failure: Secondary | ICD-10-CM | POA: Insufficient documentation

## 2021-01-06 DIAGNOSIS — C7931 Secondary malignant neoplasm of brain: Secondary | ICD-10-CM | POA: Diagnosis not present

## 2021-01-06 DIAGNOSIS — I639 Cerebral infarction, unspecified: Secondary | ICD-10-CM | POA: Diagnosis present

## 2021-01-06 DIAGNOSIS — Z87891 Personal history of nicotine dependence: Secondary | ICD-10-CM | POA: Insufficient documentation

## 2021-01-06 DIAGNOSIS — Z85118 Personal history of other malignant neoplasm of bronchus and lung: Secondary | ICD-10-CM | POA: Diagnosis not present

## 2021-01-06 DIAGNOSIS — R0689 Other abnormalities of breathing: Secondary | ICD-10-CM | POA: Diagnosis not present

## 2021-01-06 DIAGNOSIS — E876 Hypokalemia: Secondary | ICD-10-CM

## 2021-01-06 DIAGNOSIS — Z8673 Personal history of transient ischemic attack (TIA), and cerebral infarction without residual deficits: Secondary | ICD-10-CM | POA: Insufficient documentation

## 2021-01-06 DIAGNOSIS — R634 Abnormal weight loss: Secondary | ICD-10-CM

## 2021-01-06 DIAGNOSIS — R531 Weakness: Secondary | ICD-10-CM | POA: Diagnosis not present

## 2021-01-06 DIAGNOSIS — R0902 Hypoxemia: Secondary | ICD-10-CM | POA: Diagnosis not present

## 2021-01-06 DIAGNOSIS — G9389 Other specified disorders of brain: Secondary | ICD-10-CM | POA: Diagnosis not present

## 2021-01-06 DIAGNOSIS — G936 Cerebral edema: Secondary | ICD-10-CM | POA: Diagnosis not present

## 2021-01-06 DIAGNOSIS — Z20822 Contact with and (suspected) exposure to covid-19: Secondary | ICD-10-CM | POA: Diagnosis not present

## 2021-01-06 DIAGNOSIS — E46 Unspecified protein-calorie malnutrition: Secondary | ICD-10-CM | POA: Insufficient documentation

## 2021-01-06 DIAGNOSIS — R569 Unspecified convulsions: Principal | ICD-10-CM | POA: Insufficient documentation

## 2021-01-06 DIAGNOSIS — E878 Other disorders of electrolyte and fluid balance, not elsewhere classified: Secondary | ICD-10-CM | POA: Insufficient documentation

## 2021-01-06 DIAGNOSIS — R29818 Other symptoms and signs involving the nervous system: Secondary | ICD-10-CM | POA: Diagnosis not present

## 2021-01-06 DIAGNOSIS — R404 Transient alteration of awareness: Secondary | ICD-10-CM | POA: Diagnosis not present

## 2021-01-06 DIAGNOSIS — C3490 Malignant neoplasm of unspecified part of unspecified bronchus or lung: Secondary | ICD-10-CM | POA: Diagnosis not present

## 2021-01-06 DIAGNOSIS — R29810 Facial weakness: Secondary | ICD-10-CM | POA: Diagnosis not present

## 2021-01-06 LAB — DIFFERENTIAL
Abs Immature Granulocytes: 0.07 10*3/uL (ref 0.00–0.07)
Basophils Absolute: 0.1 10*3/uL (ref 0.0–0.1)
Basophils Relative: 1 %
Eosinophils Absolute: 1 10*3/uL — ABNORMAL HIGH (ref 0.0–0.5)
Eosinophils Relative: 7 %
Immature Granulocytes: 1 %
Lymphocytes Relative: 11 %
Lymphs Abs: 1.5 10*3/uL (ref 0.7–4.0)
Monocytes Absolute: 1 10*3/uL (ref 0.1–1.0)
Monocytes Relative: 7 %
Neutro Abs: 10.7 10*3/uL — ABNORMAL HIGH (ref 1.7–7.7)
Neutrophils Relative %: 73 %

## 2021-01-06 LAB — COMPREHENSIVE METABOLIC PANEL
ALT: 23 U/L (ref 0–44)
AST: 36 U/L (ref 15–41)
Albumin: 2.5 g/dL — ABNORMAL LOW (ref 3.5–5.0)
Alkaline Phosphatase: 189 U/L — ABNORMAL HIGH (ref 38–126)
Anion gap: 22 — ABNORMAL HIGH (ref 5–15)
BUN: <5 mg/dL — ABNORMAL LOW (ref 6–20)
CO2: 16 mmol/L — ABNORMAL LOW (ref 22–32)
Calcium: 8.7 mg/dL — ABNORMAL LOW (ref 8.9–10.3)
Chloride: 97 mmol/L — ABNORMAL LOW (ref 98–111)
Creatinine, Ser: 0.84 mg/dL (ref 0.61–1.24)
GFR, Estimated: >60 mL/min (ref >60)
Glucose, Bld: 223 mg/dL — ABNORMAL HIGH (ref 70–99)
Potassium: 2.5 mmol/L — CL (ref 3.5–5.1)
Sodium: 135 mmol/L (ref 135–145)
Total Bilirubin: 0.8 mg/dL (ref 0.3–1.2)
Total Protein: 6.2 g/dL — ABNORMAL LOW (ref 6.5–8.1)

## 2021-01-06 LAB — CBC
HCT: 40.3 % (ref 39.0–52.0)
Hemoglobin: 13 g/dL (ref 13.0–17.0)
MCH: 29.5 pg (ref 26.0–34.0)
MCHC: 32.3 g/dL (ref 30.0–36.0)
MCV: 91.4 fL (ref 80.0–100.0)
Platelets: 363 10*3/uL (ref 150–400)
RBC: 4.41 MIL/uL (ref 4.22–5.81)
RDW: 14.3 % (ref 11.5–15.5)
WBC: 14.4 10*3/uL — ABNORMAL HIGH (ref 4.0–10.5)
nRBC: 0 % (ref 0.0–0.2)

## 2021-01-06 LAB — CBG MONITORING, ED
Glucose-Capillary: 203 mg/dL — ABNORMAL HIGH (ref 70–99)
Glucose-Capillary: 165 mg/dL — ABNORMAL HIGH (ref 70–99)

## 2021-01-06 LAB — HEMOGLOBIN A1C
Hgb A1c MFr Bld: 4.6 % — ABNORMAL LOW (ref 4.8–5.6)
Mean Plasma Glucose: 85.32 mg/dL

## 2021-01-06 LAB — I-STAT CHEM 8, ED
BUN: <3 mg/dL — ABNORMAL LOW (ref 6–20)
Calcium, Ion: 1.09 mmol/L — ABNORMAL LOW (ref 1.15–1.40)
Chloride: 96 mmol/L — ABNORMAL LOW (ref 98–111)
Creatinine, Ser: 0.5 mg/dL — ABNORMAL LOW (ref 0.61–1.24)
Glucose, Bld: 219 mg/dL — ABNORMAL HIGH (ref 70–99)
HCT: 42 % (ref 39.0–52.0)
Hemoglobin: 14.3 g/dL (ref 13.0–17.0)
Potassium: 2.4 mmol/L — CL (ref 3.5–5.1)
Sodium: 136 mmol/L (ref 135–145)
TCO2: 17 mmol/L — ABNORMAL LOW (ref 22–32)

## 2021-01-06 LAB — PHOSPHORUS: Phosphorus: 3.9 mg/dL (ref 2.5–4.6)

## 2021-01-06 LAB — SARS CORONAVIRUS 2 (TAT 6-24 HRS): SARS Coronavirus 2: NEGATIVE

## 2021-01-06 LAB — APTT: aPTT: 28 seconds (ref 24–36)

## 2021-01-06 LAB — T4: T4, Total: 6.6 ug/dL (ref 4.5–12.0)

## 2021-01-06 LAB — PROTIME-INR
INR: 1.2 (ref 0.8–1.2)
Prothrombin Time: 15.2 seconds (ref 11.4–15.2)

## 2021-01-06 LAB — MAGNESIUM
Magnesium: 1.5 mg/dL — ABNORMAL LOW (ref 1.7–2.4)
Magnesium: 1.5 mg/dL — ABNORMAL LOW (ref 1.7–2.4)

## 2021-01-06 MED ORDER — SODIUM CHLORIDE 0.9% FLUSH
3.0000 mL | Freq: Once | INTRAVENOUS | Status: AC
Start: 2021-01-06 — End: 2021-01-06
  Administered 2021-01-06: 3 mL via INTRAVENOUS

## 2021-01-06 MED ORDER — ACETAMINOPHEN 650 MG RE SUPP: 650.0000 mg | Freq: Four times a day (QID) | RECTAL | Status: DC | PRN

## 2021-01-06 MED ORDER — MAGNESIUM SULFATE 2 GM/50ML IV SOLN
2.0000 g | Freq: Once | INTRAVENOUS | Status: AC
  Administered 2021-01-06: 2 g via INTRAVENOUS
  Filled 2021-01-06: qty 50

## 2021-01-06 MED ORDER — GADOBUTROL 1 MMOL/ML IV SOLN
8.0000 mL | Freq: Once | INTRAVENOUS | Status: AC | PRN
  Administered 2021-01-06: 8 mL via INTRAVENOUS

## 2021-01-06 MED ORDER — LACTATED RINGERS IV BOLUS
1000.0000 mL | Freq: Once | INTRAVENOUS | Status: AC
  Administered 2021-01-06: 1000 mL via INTRAVENOUS

## 2021-01-06 MED ORDER — INSULIN ASPART 100 UNIT/ML ~~LOC~~ SOLN
0.0000 [IU] | Freq: Three times a day (TID) | SUBCUTANEOUS | Status: DC
  Administered 2021-01-06: 3 [IU] via SUBCUTANEOUS
  Administered 2021-01-07 – 2021-01-08 (×2): 2 [IU] via SUBCUTANEOUS

## 2021-01-06 MED ORDER — SODIUM CHLORIDE 0.9 % IV BOLUS
500.0000 mL | Freq: Once | INTRAVENOUS | Status: AC
  Administered 2021-01-06: 500 mL via INTRAVENOUS

## 2021-01-06 MED ORDER — LORAZEPAM 2 MG/ML IJ SOLN
INTRAMUSCULAR | Status: AC
  Filled 2021-01-06: qty 1

## 2021-01-06 MED ORDER — POTASSIUM CHLORIDE 10 MEQ/100ML IV SOLN
10.0000 meq | INTRAVENOUS | Status: AC
  Administered 2021-01-06 (×4): 10 meq via INTRAVENOUS
  Filled 2021-01-06 (×4): qty 100

## 2021-01-06 MED ORDER — LEVETIRACETAM IN NACL 1000 MG/100ML IV SOLN
1000.0000 mg | Freq: Once | INTRAVENOUS | Status: AC
  Administered 2021-01-06: 1000 mg via INTRAVENOUS

## 2021-01-06 MED ORDER — POTASSIUM CHLORIDE CRYS ER 20 MEQ PO TBCR
40.0000 meq | EXTENDED_RELEASE_TABLET | Freq: Once | ORAL | Status: AC
  Administered 2021-01-06: 40 meq via ORAL
  Filled 2021-01-06: qty 2

## 2021-01-06 MED ORDER — LEVETIRACETAM IN NACL 1000 MG/100ML IV SOLN
1000.0000 mg | Freq: Once | INTRAVENOUS | Status: AC
  Administered 2021-01-06: 1000 mg via INTRAVENOUS
  Filled 2021-01-06: qty 100

## 2021-01-06 MED ORDER — ONDANSETRON HCL 4 MG/2ML IJ SOLN: 4.0000 mg | Freq: Four times a day (QID) | INTRAMUSCULAR | Status: DC | PRN

## 2021-01-06 MED ORDER — DEXAMETHASONE SODIUM PHOSPHATE 4 MG/ML IJ SOLN
INTRAMUSCULAR | Status: DC | PRN
  Administered 2021-01-06: 10 mg via INTRAVENOUS

## 2021-01-06 MED ORDER — LEVETIRACETAM IN NACL 1500 MG/100ML IV SOLN
1500.0000 mg | Freq: Two times a day (BID) | INTRAVENOUS | Status: DC
  Administered 2021-01-07 – 2021-01-08 (×3): 1500 mg via INTRAVENOUS
  Filled 2021-01-06 (×3): qty 100

## 2021-01-06 MED ORDER — POLYETHYLENE GLYCOL 3350 17 G PO PACK: 17.0000 g | PACK | Freq: Every day | ORAL | Status: DC | PRN

## 2021-01-06 MED ORDER — DEXAMETHASONE SODIUM PHOSPHATE 4 MG/ML IJ SOLN
4.0000 mg | Freq: Four times a day (QID) | INTRAMUSCULAR | Status: DC
  Administered 2021-01-06 – 2021-01-08 (×7): 4 mg via INTRAVENOUS
  Filled 2021-01-06 (×7): qty 1

## 2021-01-06 MED ORDER — LORAZEPAM 2 MG/ML IJ SOLN
INTRAMUSCULAR | Status: DC | PRN
Start: 2021-01-06 — End: 2021-01-06
  Administered 2021-01-06: 1 mg via INTRAVENOUS

## 2021-01-06 MED ORDER — LORAZEPAM 2 MG/ML IJ SOLN: 1.0000 mg | INTRAMUSCULAR | Status: DC | PRN

## 2021-01-06 MED ORDER — ONDANSETRON HCL 4 MG PO TABS: 4.0000 mg | ORAL_TABLET | Freq: Four times a day (QID) | ORAL | Status: DC | PRN

## 2021-01-06 MED ORDER — LORAZEPAM 2 MG/ML IJ SOLN: 0.5000 mg | INTRAMUSCULAR | Status: DC | PRN

## 2021-01-06 MED ORDER — ENOXAPARIN SODIUM 40 MG/0.4ML ~~LOC~~ SOLN
40.0000 mg | SUBCUTANEOUS | Status: DC
  Administered 2021-01-06 – 2021-01-07 (×2): 40 mg via SUBCUTANEOUS
  Filled 2021-01-06 (×2): qty 0.4

## 2021-01-06 MED ORDER — ACETAMINOPHEN 325 MG PO TABS: 650.0000 mg | ORAL_TABLET | Freq: Four times a day (QID) | ORAL | Status: DC | PRN

## 2021-01-06 NOTE — ED Notes (Signed)
Report called, patient to be transported with all belongings

## 2021-01-06 NOTE — ED Notes (Signed)
Assumed care on patient , resting with no distress, respirations unlabored /denies pain , IV site and porta cath access intact , 3rd run of Kcl IV infusing , patient waiting for in-patient telemetry bed assignment .

## 2021-01-06 NOTE — ED Notes (Signed)
[facility] (Code Stroke Cancel)called @ 1438-per Dr. Alvino Chapel called by Levada Dy

## 2021-01-06 NOTE — ED Notes (Signed)
Patients daughter would like an update if one is available, Caylee [phone]

## 2021-01-06 NOTE — Consult Note (Addendum)
Neurology Code Stroke Consult H&P  CC: CODE STROKE  History is obtained from: EMS and patient  HPI: Tyler Gallagher is a 60 y.o. male with a PMHx of Stage IV lung adenocarcinoma diagnosed in 6/21 at [facility]. 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 He denies use of any anticoagulation meds at home including ASA. He has not been on AED since d/c from hospital 7/21.   Pt states that today, he began to shake in his UEs followed by whole body shaking except for head. Wife called 23 and patient was brought in by EMS as a code stroke due to findings of right sided weakness and left gaze preference. LKW was 1253 hrs.  Pt was taken urgently to CT.   On exam, he displays no aphasia or dysarthria. + right UE/LE weakness, worse in RLE (which review of chart and per pt, is chronic since 6/21). Pt was given Versed 75m IV in the field for seizure like activity. Twitching noted on arrival to ED. Pt was given 171mAtivan urgently which slowed but persistent partial twitching of his RLE. 2 grams IV Keppra load was ordered. CTH showed vasogenic edema to high left parietal lobe with suspicion of mass concerning for progression of metastasis.   Chart review: [facility], his MRI brain showed left medial frontal lobe metastatic enhancing lesion with vasogenic edema. Since dx in 6/21, patient underwent chemo x 6 cycles that was completed in 10/21. After brain mets were discovered, [facility] which continues today due to suspicion of new lesions. [facility] prescribed Keppra 1 gm po bid x 10 days with instructions to taper to 50033mo bid x 10 days if no seizures on  1gm dose. Further instructions were to stop med if no seizures. Pt ran out of med, so he stopped it before the taper was finished, but reported no seizure activity until today. He was also discharged on a Decadron taper. A f/up scan showed liver metastasis in 11/21. [facility] [place] reveals that patient chose Keytruda due to his past hx of intolerance to chemo. He also c/o continued weight loss in spite of conservative treatment (Marinol) and he is slated to have a feeding tube placed by Dr. PabDahlia Byesn BurElizabeth Gallagher 01/12/21.    LKW: 1253 hrs tpa given?: No, pt has brain metastases IR Thrombectomy? No, low suspicion for stroke given the noted vasogenic edema on CT head. Modified Rankin Score-2  NIHSS:  1a Level of Conscious: 0 1b LOC Questions: 0 1c LOC Commands: 0 2 Best Gaze: 0 3 Visual: 0 4 Facial Palsy: 0 5a Motor Arm - left: 0 5b Motor Arm - Right: 0 6a Motor Leg - Left: 0 6b Motor Leg - Right: 3 7 Limb Ataxia: 0 8 Sensory: 0 9 Best Language: 0 10 Dysarthria: 0 11 Extinct. and Inatten: 0 TOTAL: 3   ROS: A complete ROS was unable to be performed due to emergent event.   Past Medical History:  Diagnosis Date  . Diabetes mellitus without complication ([place] . Hyperlipidemia   . Hypertension   .  Lesion of brain   . Lung cancer ([place])   . Mass of lung   Left AKA due to necrotizing fascitis  Family History  Problem Relation Age of Onset  . Diabetes Sister   . Diabetes Brother     Social History:  reports that he quit smoking about 6 months ago. His smoking use included cigarettes. He has a 64.00 pack-year smoking history. He has never used smokeless tobacco. He reports previous drug use. Drug: Marijuana. He reports that he does not drink alcohol.   Prior to Admission medications   Medication Sig Start Date End Date Taking? Authorizing Provider  dronabinol (MARINOL) 5 MG capsule Take 1 capsule (5 mg total) by mouth 2  (two) times daily before a meal. Patient not taking: No sig reported 12/05/20   [name], MD  metoprolol succinate (TOPROL-XL) 25 MG 24 hr tablet Take 0.5 tablets (12.5 mg total) by mouth daily. Patient not taking: No sig reported 11/15/20   Kate Sable, MD  potassium chloride SA (KLOR-CON) 20 MEQ tablet Take 1 tablet (20 mEq total) by mouth 2 (two) times daily. Patient taking differently: Take 20 mEq by mouth daily. 11/18/20   [name], MD  vancomycin (VANCOCIN) 50 mg/mL oral solution Take 2.5 mLs (125 mg total) by mouth 4 (four) times daily. Patient not taking: Reported on 01/06/2021 11/10/20   Mckinley Jewel, MD  vitamin B-12 (CYANOCOBALAMIN) 1000 MCG tablet Take 1,000 mcg by mouth daily.    [provider]  On chart, was on Lisinopril/HCTZ in July 2021  Exam: Current vital signs: BP 98/77   Pulse (!) 137   Temp 98.2 F (36.8 C) (Temporal)   Resp (!) 35   Ht _0  (1.905 m)   Wt 79.2 kg   SpO2 93%   BMI 21.82 kg/m   Physical Exam  Constitutional: Appears well-developed and chronically ill. Psych: Affect appropriate to situation Eyes: No scleral injection HENT: No OP obstrucion Head: Normocephalic.  Cardiovascular: RRR Respiratory: Effort normal. GI: Soft.  Skin: WDI  Neuro: Mental Status: Patient is awake, alert, oriented to person, place, month, year, and situation. Patient is able to give a clear and coherent history. No signs of aphasia or neglect. Speech/Language: Speech is intact and fluent. No dysarthria.  Comprehension, repetition, and naming intact.  Cranial Nerves: II: Visual Fields are full. Pupils are equal, round, and reactive to light. III,IV, VI: EOMI without ptosis or diploplia.  V: Facial sensation is symmetric to light touch. Able to move jaw back and forth.  VII: Smile is symmetrical. Able to puff cheeks and raise eyebrows.  VIII: hearing is intact to voice IX, X: Uvula elevates symmetrically. Phonation normal.  XI:  Shoulder shrug is symmetric. XII: tongue is midline without atrophy or fasciculations.  Motor: Tone is normal. Bulk is normal. 4/5 strength UEs. Is unable to lift RLE off bed and has a LLE AKA.  Sensory: Sensation is symmetric to light touch in the arms and legs. No Extinction noted. Deep Tendon Reflexes: 1+ and symmetric in the brachioradialis. 0 patella RLE. LLE AKA.  Plantars: Toes are downgoing on right.  Cerebellar: FNF intact. Can not perform LE due to weakness and amputation.  Gait: deferred.   I have reviewed labs in epic and the pertinent results are:  INR 1.2   TSH 2.981 Glucose 219  K 2.4   Dr. Milas Gain reviewed images independently.  CTH-Vasogenic edema high left parietal lobe with suspicion for mass concerning for progression of  mets.   Assessment: DERRIOUS BOLOGNA is a 60 y.o. male PMHx Stage IV lung adenocarcinoma with metastases to the brain and liver. He underwent chemotherapy x 6 rounds after diagnosis 6/21, then resection of brain met in 7/21, followed by whole brain radiation which continues. He is now on Keytruda per hemoc. Pt presented today with weakness on the right side. Pt was twitching on arrival. Ativan was given in CT. CTH with vasogenic edema and mass concerning for progression of brain mets. No stroke seen on imaging. Pt was not a candidate for tPA due to brain mets and s/p crani for resection of mets. He now presents with seizure like activity with review of chart showing incomplete Keppra taper after 7/21 crani and no AEDs since then.   Impression: 1. Seizure like activity likely secondary to brain metastasis with new lesion on exam as well as vasogenic edema.   Plan: - Keppra load 2 gms now-being given. -Keppra 1557m po q12 hrs. -Ativan 172mIV q4hrs prn seizure like activity over 3 mins. -Decadron 56m19m6hrs-ordered. - MRI brain with and without contrast-ordered. - SBP goal - normotensive. -Telemetry monitoring for arrhythmia. - Recommend bedside Swallow  screen. - REEG is unlikely to change management, clinically obvious it was a seizure and will still do AEDs if EEG is negative. - seizure and fall precautions-ordered. -PT/OT eval and tx - Recommend metabolic/infectious workup with CBC, CMP, UA with UCx, CXR, CK, serum lactate.  NEUROHOSPITALIST ADDENDUM Performed a face to face diagnostic evaluation.   I have reviewed the contents of history and physical exam as documented by PA/ARNP/Resident and agree with above documentation.  I have discussed and formulated the above plan as documented. Edits to the note have been made as needed.  Impression: [age]with known brain mets, adenocarcinoma of brain primary p/w RUE and RLE rhythmic twitching concerning for focal seizure with retained awareness. Seizure activity stopped with Ativan, Keppra load and Decadron. Key exam findings: He is weaker in RUE, plegic in RLE and is s/p LLE amputation. No other focal neurological deficit. Plan: Keppra 1500m73mD up from home dose of 1000mg92m. Decadron 56mg Q53m can space out as needed. MRI Brain with and without contrast. Metabolic and infectious workup to look for potential cause for lowered seizure threshold in addition to concern for potential mets. Ativan 1mg as73meded for seizure activity lasting more than 3 mins.  This patient is critically ill and at significant risk of neurological worsening, death and care requires constant monitoring of vital signs, hemodynamics,respiratory and cardiac monitoring, neurological assessment, discussion with family, other specialists and medical decision making of high complexity. I spent 35 minutes of neurocritical care time  in the care of  this patient. This was time spent independent of any time provided by nurse practitioner or PA.  Safari Cinque Donnetta SimpersNeurohospitalists Pager Number [phone]  5:06 PM  Cortlyn Cannell Donnetta Simpersiad Neurohospitalists [phone] to 7am, please call on call as  listed on AMION.

## 2021-01-06 NOTE — ED Triage Notes (Signed)
Pt arrives from home with [place] ems as code stroke, LSN 1300. EMS reports right sided weakness and left sided gaze. Pt has seizure with ems lasting about 1 minute, given 2mg  versed. Pt having tremors upon arrival to ed. Hx of brain cancer.

## 2021-01-06 NOTE — ED Provider Notes (Signed)
Patient signed out to me as pending admission.  Upon my evaluation he is awake and alert has no complaints of pain or discomfort.  His heart rate is improved to about 125 bpm appears to be sinus.  Case discussed with teaching team who will admit the patient for further work-up and evaluation.   Luna Fuse, MD 01/06/21 [phone]

## 2021-01-06 NOTE — H&P (Addendum)
Date: 01/06/2021               Patient Name:  Tyler Gallagher MRN: [id number]  DOB: 1961/03/12 Age / Sex: 60 y.o., male   PCP: Juluis Pitch, MD         Medical Service: Internal Medicine Teaching Service         Attending Physician: Dr. Velna Ochs, MD    First Contact: Dr. Charleen Kirks Pager: [phone]  Second Contact: Dr. Laural Golden Pager: [phone]       After Hours (After 5p/  First Contact Pager: [phone]  weekends / holidays): Second Contact Pager: [phone]   Chief Complaint: Code Stroke   History of Present Illness:   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   [name] states he was in his usual state of health and laying in bed when he suddenly starting shaking all over. He remained conscious for the first few moments but did lose consciousness at some point. He does not remember falling off the bed. He feels like he was in and out of consciousness on the drive here while in the ambulance. On arrival, per EDP note, patient was altered. Currently, he is alert and oriented but is feeling generalized weakness. Denies any current headache, dizziness, vision changes, chest pain, SOB.   He endorses intermittent diarrhea for the past couple days with usually a few episodes per day. The stool is loose. The diarrhea initially started when he started chemotherapy. He denies nausea, vomiting, abdominal pain.   Girlfriend states that he was laying down and she gave him some water. She left the room for a while and when she came back, he was breathing very hard and shaking his arms and legs. He then progressed to being completely stiff and not responding to sternal rubs. His eyes were rolled back. His girlfriend was scared he died. She states his first seizure earlier this year presented exactly the  same way.  ED Course:  On EMS arrival, with right-sided shaking and right sided gaze. He was given two doses of Ativan with improvement. On arrival to the ED, he was moving all extremities.  Neurology was present as well and he was loaded with Decadron and Keppra.  Additional doses of Ativan were given.  Eitel signs on arrival show blood pressure of 105/85, oxygen saturation 92% on room air, heart rate of 154.  She was afebrile.  Initial blood work shows potassium of 2.5, bicarb of 16, BUN of less than 5, anion gap of 22.  CBC shows mild leukocytosis at 14.4 with left shift without any evidence of anemia or thrombocytopenia.  Initial CT head showed new vasogenic edema in the high left parietal lobe with suspected mass in this region, concerning for progression of the patient's known metastatic disease. Local sulcal effacement without midline shift.  MRI brain was obtained and showed increase in size and heterogeneity of parasagittal left parietal lesion with extensive surrounding edema.  Meds:  Current Facility-Administered Medications on File Prior to Encounter  Medication Dose Route Frequency Provider Last Rate Last Admin  . heparin lock flush 100 unit/mL  500 Units Intravenous Once Charlaine Dalton R, MD      . sodium chloride flush (NS) 0.9 % injection 10 mL  10 mL Intravenous Once [place],  Elisha Headland, MD       Current Outpatient Medications on File Prior to Encounter  Medication Sig Dispense Refill  . potassium chloride SA (KLOR-CON) 20 MEQ tablet Take 1 tablet (20 mEq total) by mouth 2 (two) times daily. (Patient taking differently: Take 20 mEq by mouth daily.) 28 tablet 0  . vitamin B-12 (CYANOCOBALAMIN) 1000 MCG tablet Take 1,000 mcg by mouth daily.    Marland Kitchen dronabinol (MARINOL) 5 MG capsule Take 1 capsule (5 mg total) by mouth 2 (two) times daily before a meal. (Patient not taking: No sig reported) 60 capsule 0  . metoprolol succinate (TOPROL-XL) 25 MG 24 hr tablet Take 0.5 tablets (12.5 mg  total) by mouth daily. (Patient not taking: No sig reported) 15 tablet 5  . vancomycin (VANCOCIN) 50 mg/mL oral solution Take 2.5 mLs (125 mg total) by mouth 4 (four) times daily. (Patient not taking: No sig reported)     Allergies: Allergies as of 01/06/2021 - Review Complete 01/06/2021  Allergen Reaction Noted  . Penicillins Other (See Comments) 02/07/2017   Past Medical History:  Diagnosis Date  . Diabetes mellitus without complication ([place])   . Hyperlipidemia   . Hypertension   . Lesion of brain   . Lung cancer ([place])   . Mass of lung    Family History:  Family History  Problem Relation Age of Onset  . Diabetes Sister   . Diabetes Brother    Social History:  Lives with his girlfriend of 10 years.  Denies any current tobacco and alcohol use.   Review of Systems: A complete ROS was negative except as per HPI.   Physical Exam: Blood pressure 112/76, pulse (!) 106, temperature (!) 97.1 F (36.2 C), temperature source Temporal, resp. rate 16, height 6\' 3"  (1.905 m), weight 79.2 kg, SpO2 96 %.  Physical Exam Vitals and nursing note reviewed.  Constitutional:      General: He is not in acute distress.    Appearance: He is normal weight. He is ill-appearing (chronically).  HENT:     Head: Normocephalic and atraumatic.     Mouth/Throat:     Mouth: Mucous membranes are moist.     Pharynx: Oropharynx is clear.  Eyes:     Extraocular Movements: Extraocular movements intact.     Conjunctiva/sclera: Conjunctivae normal.     Pupils: Pupils are equal, round, and reactive to light.  Cardiovascular:     Rate and Rhythm: Regular rhythm. Tachycardia present.     Heart sounds: No murmur heard.   Pulmonary:     Effort: Pulmonary effort is normal. No respiratory distress.     Breath sounds: No wheezing or rales.  Abdominal:     General: Bowel sounds are normal. There is no distension.     Palpations: Abdomen is soft.     Tenderness: There is no abdominal tenderness. There is no  guarding.  Musculoskeletal:     Right lower leg: No edema.     Left Lower Extremity: Left leg is amputated above knee.  Skin:    General: Skin is warm and dry.  Neurological:     Mental Status: He is alert and oriented to person, place, and time.     Comments: 5/5 bilateral upper extremity strength. 0/5 strength of right lower extremity for lifting, but some foot movement observed. Sensation intact.   Psychiatric:        Mood and Affect: Mood normal.        Behavior: Behavior normal.  EKG: personally reviewed my interpretation is: Rate of 146. No discernible P waves, although with the rate, may be buried. No ST segment elevation or depression to suggest ischemia.   Assessment & Plan by Problem: Active Problems:   Seizure [facility])  Mr. 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   # Seizure  # Brain Metastasis Significant brain metastasis with associated vasogenic edema and local sulcal effacement, although no midline shift. Seizure activity aborted with Ativan and since has been loaded with Keppra and Decadron. At this time, patient is nearly returned back to baseline other than some right lower extremity weakness.    - Neurology following, appreciate their recommendations  - Keppra 1500 mg BID - Ativan PRN for seizure-like activity > 3 minutes  - Decadron 4 mg q6h  # Hx of Inappropriate Sinus Tachycardia  # Hx of Atrial Fibrillation Hx of inappropriate sinus tachycardia recently that was previously treated with Metoprolol by Cardiology but discontinued in November 2021 after tachycardia resolved. He also has a hx of A. Fib, however current rate is regular, so less likely. Given EKG is without P waves, suspect atrial flutter versus sinus tachycardia with buried P  waves due to high rate. More likely sinus tachycardia given improvement with fluids.   - Repeat EKG - Additional 1 L of LR bolus  - Telemetry   # Hypokalemia  # Hypomagnesemia  History of requiring IV potassium infusions through Oncology. In the setting of chronic intermittent diarrhea and nausea due to chemotherapy and radiation. Last potassium infusion was yesterday with 40 mEq. So far, received 40 mEq in the ED and will try another 40 orally.   - 40 mEq Kdur. If unable to tolerate, will switch to IV - Mag sulfate 2 g IV  - BMP in the AM  # AGMA Likely due to lactic acidosis in the setting of seizure. I expect it will resolve with fluids.   - 1 L of LR bolus  - BMP in the AM  # Type 2 Diabetes Mellitus  Last A1c of 7.7%. Not on any anti-glycemic agents at home.   - SSI (moderate)   # Stage IV Adenocarcinoma of the Lung Currently undergoing treatment with Keytruda and has received 2 infusions. He previously received 6 cycles of Carboplatin/Paclitaxel, as well as whole brain radiation for brain metastasis. Will need to continue following with Oncology once discharged.    Diet: Normal VTE: Enoxaparin IVF: LR,Bolus Code: Full  Prior to Admission Living Arrangement: Home, living with girlfriend Anticipated Discharge Location: Home Barriers to Discharge: Continued medical evaluation   Dispo: Admit patient to Observation with expected length of stay less than 2 midnights.  Signed: Dr. Jose Persia Internal Medicine PGY-2 Pager: [phone] After 5pm on weekdays and 1pm on weekends: On Call pager [phone]  01/06/2021, 7:30 PM

## 2021-01-06 NOTE — ED Provider Notes (Signed)
[facility] EMERGENCY DEPARTMENT Provider Note   CSN: [id number] Arrival date & time: 01/06/21  1422  An emergency department physician performed an initial assessment on this suspected stroke patient at 80.  History Chief Complaint  Patient presents with  . Code Stroke    Tyler Gallagher is a 60 y.o. male.  HPI Patient brought in as a code stroke.  Reportedly started shaking on the right side and looking away from the right side.  History of seizures due to lung cancer with brain mets.  CBG reassuring for EMS.  Having given 2 of Ativan with improvement of symptoms.  Upon arrival to the ER moving bilateral extremities although does have some tremors still.  Awake and answers questions.  Denies chest pain.  She denies trouble breathing.    Past Medical History:  Diagnosis Date  . Diabetes mellitus without complication ([place])   . Hyperlipidemia   . Hypertension   . Lesion of brain   . Lung cancer ([place])   . Mass of lung     Patient Active Problem List   Diagnosis Date Noted  . Dizziness 11/08/2020  . Vertigo 11/08/2020  . Chronic systolic CHF (congestive heart failure) ([place]) 11/08/2020  . Hypomagnesemia 11/08/2020  . Hypotension 11/08/2020  . Hypokalemia 11/08/2020  . Nausea vomiting and diarrhea 11/08/2020  . Anxiety 11/08/2020  . Palliative care encounter   . Chemotherapy induced neutropenia ([place]) 09/29/2020  . Goals of care, counseling/discussion 07/08/2020  . Malignant neoplasm of lower lobe of right lung ([place]) 07/08/2020  . Brain metastasis ([place]) 06/22/2020  . Vasogenic brain edema ([place]) 06/21/2020  . Hyperlipidemia 01/02/2016  . Hypertriglyceridemia 01/02/2016  . Tobacco abuse 01/02/2016  . Type 2 diabetes mellitus without complication, without long-term current use of insulin ([place]) 01/02/2016  . Obesity 01/02/2016  . S/P AKA (above knee amputation) unilateral ([place]) 01/02/2016  . Amaurosis fugax 01/01/2016  . TIA (transient ischemic attack)  01/01/2016    Past Surgical History:  Procedure Laterality Date  . 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 COLONOSCOPY    . CYSTOSCOPY W/ RETROGRADES Bilateral 07/14/2020   Procedure: CYSTOSCOPY WITH RETROGRADE PYELOGRAM;  Surgeon: Hollice Espy, MD;  Location: [facility] ORS;  Service: Urology;  Laterality: Bilateral;  . PORTA CATH INSERTION N/A 08/03/2020   Procedure: PORTA CATH INSERTION;  Surgeon: Katha Cabal, MD;  Location: [facility];  Service: Cardiovascular;  Laterality: N/A;  . PORTA CATH INSERTION N/A 10/03/2020   Procedure: PORTA CATH INSERTION;  Surgeon: Algernon Huxley, MD;  Location: [facility];  Service: Cardiovascular;  Laterality: N/A;  . VIDEO BRONCHOSCOPY WITH ENDOBRONCHIAL NAVIGATION N/A 07/01/2020   Procedure: VIDEO BRONCHOSCOPY WITH ENDOBRONCHIAL NAVIGATION;  Surgeon: [name], MD;  Location: [facility] ORS;  Service: Thoracic;  Laterality: N/A;  . VIDEO BRONCHOSCOPY WITH ENDOBRONCHIAL ULTRASOUND N/A 07/01/2020   Procedure: VIDEO BRONCHOSCOPY WITH ENDOBRONCHIAL ULTRASOUND;  Surgeon: [name], MD;  Location: [facility] ORS;  Service: Thoracic;  Laterality: N/A;       Family History  Problem Relation Age of Onset  . Diabetes Sister   . Diabetes Brother     Social History   Tobacco Use  . Smoking status: Former Smoker    Packs/day: 2.00    Years: 32.00    Pack years: 64.00    Types: Cigarettes    Quit date: 06/21/2020    Years since quitting: 0.5  . Smokeless tobacco: Never Used  Vaping Use  . Vaping Use: Never used  Substance Use Topics  . Alcohol use: No    Alcohol/week: 0.0 standard drinks  . Drug use: Not Currently    Types: Marijuana    Home Medications Prior to Admission medications   Medication Sig Start Date End Date Taking? Authorizing Provider  dronabinol (MARINOL) 5 MG capsule Take 1 capsule (5 mg total) by mouth 2 (two) times daily before a meal. Patient not taking: No sig reported 12/05/20   [name], MD  metoprolol  succinate (TOPROL-XL) 25 MG 24 hr tablet Take 0.5 tablets (12.5 mg total) by mouth daily. Patient not taking: No sig reported 11/15/20   Kate Sable, MD  potassium chloride SA (KLOR-CON) 20 MEQ tablet Take 1 tablet (20 mEq total) by mouth 2 (two) times daily. Patient taking differently: Take 20 mEq by mouth daily. 11/18/20   [name], MD  vancomycin (VANCOCIN) 50 mg/mL oral solution Take 2.5 mLs (125 mg total) by mouth 4 (four) times daily. Patient not taking: Reported on 01/06/2021 11/10/20   Mckinley Jewel, MD  vitamin B-12 (CYANOCOBALAMIN) 1000 MCG tablet Take 1,000 mcg by mouth daily.    [provider]    Allergies    Penicillins  Review of Systems   Review of Systems  Constitutional: Positive for appetite change.  HENT: Negative for congestion.   Respiratory: Negative for shortness of breath.   Gastrointestinal: Negative for anal bleeding.  Genitourinary: Negative for flank pain.  Musculoskeletal: Negative for back pain.  Skin: Negative for wound.  Neurological: Positive for seizures.  Psychiatric/Behavioral: Positive for confusion.    Physical Exam Updated Vital Signs BP 112/85   Pulse (!) 127   Temp 98.2 F (36.8 C) (Temporal)   Resp (!) 33   Ht 6\' 3"  (1.905 m)   Wt 79.2 kg   SpO2 99%   BMI 21.82 kg/m   Physical Exam Vitals and nursing note reviewed.  HENT:     Head: Normocephalic.  Eyes:     Extraocular Movements: Extraocular movements intact.     Pupils: Pupils are equal, round, and reactive to light.  Cardiovascular:     Rate and Rhythm: Regular rhythm. Tachycardia present.  Pulmonary:     Breath sounds: No wheezing or rhonchi.  Abdominal:     Tenderness: There is no abdominal tenderness.  Musculoskeletal:        General: No tenderness.     Cervical back: Neck supple.     Comments: Previous left above-the-knee amputation.  Neurological:     General: No focal deficit present.     Mental Status: He is alert.     Comments:  Complete NIH scoring down neurology.  However now awake and appropriate moving all extremities.  Does have tremors at times particularly on right lower extremity.     ED Results / Procedures / Treatments   Labs (all labs ordered are listed, but only abnormal results are displayed) Labs Reviewed  CBC - Abnormal; Notable for the following components:      Result Value   WBC 14.4 (*)    All other components within normal limits  DIFFERENTIAL - Abnormal; Notable for the following components:   Neutro Abs 10.7 (*)    Eosinophils Absolute 1.0 (*)    All other components within normal limits  COMPREHENSIVE METABOLIC PANEL - Abnormal; Notable for the following components:   Potassium 2.5 (*)    Chloride 97 (*)    CO2 16 (*)    Glucose, Bld 223 (*)  BUN <5 (*)    Calcium 8.7 (*)    Total Protein 6.2 (*)    Albumin 2.5 (*)    Alkaline Phosphatase 189 (*)    Anion gap 22 (*)    All other components within normal limits  I-STAT CHEM 8, ED - Abnormal; Notable for the following components:   Potassium 2.4 (*)    Chloride 96 (*)    BUN <3 (*)    Creatinine, Ser 0.50 (*)    Glucose, Bld 219 (*)    Calcium, Ion 1.09 (*)    TCO2 17 (*)    All other components within normal limits  CBG MONITORING, ED - Abnormal; Notable for the following components:   Glucose-Capillary 203 (*)    All other components within normal limits  SARS CORONAVIRUS 2 (TAT 6-24 HRS)  PROTIME-INR  APTT  MAGNESIUM    EKG None  Radiology CT HEAD CODE STROKE WO CONTRAST  Result Date: 01/06/2021 CLINICAL DATA:  Code stroke.  Neuro deficit, acute stroke suspected. EXAM: CT HEAD WITHOUT CONTRAST TECHNIQUE: Contiguous axial images were obtained from the base of the skull through the vertex without intravenous contrast. COMPARISON:  MRI November 08, 2020.  CT head June 21, 2020. FINDINGS: Brain: There is new exuberant vasogenic edema in the high left parietal lobe with suspected mass in this region (see series 3, image  24). Local sulcal effacement without midline shift. Small area of encephalomalacia in the high right parafalcine region, compatible with sequela of prior metastatic lesion resection. Remote right frontal infarct with encephalomalacia. No evidence of acute large vascular territory infarct. No acute hemorrhage. No hydrocephalus. Additional patchy white matter hypoattenuation, most likely related to chronic microvascular ischemic disease Vascular: Calcific atherosclerosis. Skull: No acute fracture. Sinuses/Orbits: Mild paranasal sinus mucosal thickening with polyp versus retention cyst in the right maxillary sinus. Other: Small left mastoid effusion. IMPRESSION: New vasogenic edema in the high left parietal lobe with suspected mass in this region, concerning for progression of the patient's known metastatic disease. Local sulcal effacement without midline shift. Recommend MRI with contrast to further evaluate. Findings discussed with Dr. Milas Gain via telephone at 2:46 p.m. Electronically Signed   By: Margaretha Sheffield MD   On: 01/06/2021 14:49    Procedures Procedures (including critical care time)  Medications Ordered in ED Medications  LORazepam (ATIVAN) 2 MG/ML injection (has no administration in time range)  potassium chloride 10 mEq in 100 mL IVPB (10 mEq Intravenous New Bag/Given 01/06/21 1512)  levETIRAcetam (KEPPRA) IVPB 1500 mg/ 100 mL premix (has no administration in time range)  LORazepam (ATIVAN) injection 0.5 mg (has no administration in time range)  dexamethasone (DECADRON) injection 4 mg (has no administration in time range)  sodium chloride flush (NS) 0.9 % injection 3 mL (3 mLs Intravenous Given 01/06/21 1447)  levETIRAcetam (KEPPRA) IVPB 1000 mg/100 mL premix (0 mg Intravenous Stopped 01/06/21 1458)    Followed by  levETIRAcetam (KEPPRA) IVPB 1000 mg/100 mL premix (0 mg Intravenous Stopped 01/06/21 1515)  sodium chloride 0.9 % bolus 500 mL (500 mLs Intravenous New Bag/Given 01/06/21 1500)    ED  Course  I have reviewed the triage vital signs and the nursing notes.  Pertinent labs & imaging results that were available during my care of the patient were reviewed by me and considered in my medical decision making (see chart for details).    MDM Rules/Calculators/A&P  Patient presents as a code stroke.  However appears likely of been secondary to seizure.  Has new metastatic brain disease.  There is surrounding edema.  Seen upon arrival by myself and neurology.  Steroids and Keppra given.  Improvement of symptoms.  Also had a milligram of Ativan by [place] for continued tremors before the Keppra had been given.  However also found to be tachycardic.  Has both history of a sinus tachycardia and A. fib.  EKG somewhat indeterminant but I think there may be buried P waves although it has a consistent rate of around 150.  Will give fluid bolus and see how heart rate responds.  However patient will require admission to the hospital.  Patient's PCP is out of town.  Will discuss with unassigned medicine.  Code stroke had been canceled due to metastatic disease, seizure, and not a tPA candidate.  CRITICAL CARE Performed by: Davonna Belling Total critical care time: 30 minutes Critical care time was exclusive of separately billable procedures and treating other patients. Critical care was necessary to treat or prevent imminent or life-threatening deterioration. Critical care was time spent personally by me on the following activities: development of treatment plan with patient and/or surrogate as well as nursing, discussions with consultants, evaluation of patient's response to treatment, examination of patient, obtaining history from patient or surrogate, ordering and performing treatments and interventions, ordering and review of laboratory studies, ordering and review of radiographic studies, pulse oximetry and re-evaluation of patient's condition.  Final Clinical Impression(s) /  ED Diagnoses Final diagnoses:  Seizure ([place])  Brain metastases ([place])  Tachycardia    Rx / DC Orders ED Discharge Orders    None       Davonna Belling, MD 01/06/21 1526

## 2021-01-06 NOTE — ED Notes (Signed)
Patient returned from MRI, placed on cardiac monitor

## 2021-01-06 NOTE — Progress Notes (Signed)
Arrived to room pt in MRI. RN will notify me when pt is back

## 2021-01-06 NOTE — Code Documentation (Signed)
Patient from home with girlfriend. Has had seizure activity on and off since waking up this am. His LKW was 1300. Claims he's been "flopping all over the bed." Girlfriend called EMS who arrived and activated a code stroke after assessing right sided weakness and a left gaze. Pt taken to [facility] and met by the stroke team and Dr. Alvino Chapel. Pt with tremors upon arrival that continued after clearance and CT. CT results below. Pt was given Ativan, Keppra and Decadron per MD orders. Pt was able to answer all questions and move all extremeties during exam. He has significant weakness in the right lower extremity that family says he has had since his surgery a few months prior. Pt has a hx of lung cancer with mets to the brain. He is currently being treated with radiation and chemotherapy.  Code stroke was canceled r/t no stroke suspected. Hand off with Corrie Dandy. Leonilda Cozby, Rande Brunt, RN   IMPRESSION: New vasogenic edema in the high left parietal lobe with suspected mass in this region, concerning for progression of the patient's known metastatic disease. Local sulcal effacement without midline shift. Recommend MRI with contrast to further evaluate.

## 2021-01-06 NOTE — ED Notes (Signed)
Patient in MRI, will obtain VS when patient returns.

## 2021-01-06 NOTE — ED Notes (Signed)
Bonnie (Sister#[phone]) called for update on patient's status.  Thank you.

## 2021-01-07 DIAGNOSIS — Z20822 Contact with and (suspected) exposure to covid-19: Secondary | ICD-10-CM | POA: Diagnosis not present

## 2021-01-07 DIAGNOSIS — E119 Type 2 diabetes mellitus without complications: Secondary | ICD-10-CM | POA: Diagnosis not present

## 2021-01-07 DIAGNOSIS — E46 Unspecified protein-calorie malnutrition: Secondary | ICD-10-CM | POA: Diagnosis not present

## 2021-01-07 DIAGNOSIS — R569 Unspecified convulsions: Secondary | ICD-10-CM | POA: Diagnosis not present

## 2021-01-07 DIAGNOSIS — I5022 Chronic systolic (congestive) heart failure: Secondary | ICD-10-CM | POA: Diagnosis not present

## 2021-01-07 DIAGNOSIS — E878 Other disorders of electrolyte and fluid balance, not elsewhere classified: Secondary | ICD-10-CM | POA: Diagnosis not present

## 2021-01-07 DIAGNOSIS — C7931 Secondary malignant neoplasm of brain: Secondary | ICD-10-CM

## 2021-01-07 DIAGNOSIS — I110 Hypertensive heart disease with heart failure: Secondary | ICD-10-CM | POA: Diagnosis not present

## 2021-01-07 DIAGNOSIS — C3490 Malignant neoplasm of unspecified part of unspecified bronchus or lung: Secondary | ICD-10-CM | POA: Diagnosis not present

## 2021-01-07 LAB — COMPREHENSIVE METABOLIC PANEL
ALT: 19 U/L (ref 0–44)
AST: 24 U/L (ref 15–41)
Albumin: 2.2 g/dL — ABNORMAL LOW (ref 3.5–5.0)
Alkaline Phosphatase: 157 U/L — ABNORMAL HIGH (ref 38–126)
Anion gap: 12 (ref 5–15)
BUN: <5 mg/dL — ABNORMAL LOW (ref 6–20)
CO2: 26 mmol/L (ref 22–32)
Calcium: 8.4 mg/dL — ABNORMAL LOW (ref 8.9–10.3)
Chloride: 101 mmol/L (ref 98–111)
Creatinine, Ser: 0.53 mg/dL — ABNORMAL LOW (ref 0.61–1.24)
GFR, Estimated: >60 mL/min (ref >60)
Glucose, Bld: 138 mg/dL — ABNORMAL HIGH (ref 70–99)
Potassium: 3.2 mmol/L — ABNORMAL LOW (ref 3.5–5.1)
Sodium: 139 mmol/L (ref 135–145)
Total Bilirubin: 0.8 mg/dL (ref 0.3–1.2)
Total Protein: 5.8 g/dL — ABNORMAL LOW (ref 6.5–8.1)

## 2021-01-07 LAB — PROTIME-INR
INR: 1.2 (ref 0.8–1.2)
Prothrombin Time: 14.8 seconds (ref 11.4–15.2)

## 2021-01-07 LAB — CBC
HCT: 33.5 % — ABNORMAL LOW (ref 39.0–52.0)
Hemoglobin: 11.2 g/dL — ABNORMAL LOW (ref 13.0–17.0)
MCH: 29.9 pg (ref 26.0–34.0)
MCHC: 33.4 g/dL (ref 30.0–36.0)
MCV: 89.6 fL (ref 80.0–100.0)
Platelets: 259 10*3/uL (ref 150–400)
RBC: 3.74 MIL/uL — ABNORMAL LOW (ref 4.22–5.81)
RDW: 14.3 % (ref 11.5–15.5)
WBC: 7.7 10*3/uL (ref 4.0–10.5)
nRBC: 0 % (ref 0.0–0.2)

## 2021-01-07 LAB — GLUCOSE, CAPILLARY
Glucose-Capillary: 128 mg/dL — ABNORMAL HIGH (ref 70–99)
Glucose-Capillary: 114 mg/dL — ABNORMAL HIGH (ref 70–99)
Glucose-Capillary: 122 mg/dL — ABNORMAL HIGH (ref 70–99)

## 2021-01-07 MED ORDER — PANTOPRAZOLE SODIUM 40 MG PO TBEC
40.0000 mg | DELAYED_RELEASE_TABLET | Freq: Every day | ORAL | Status: DC
  Administered 2021-01-07 – 2021-01-08 (×2): 40 mg via ORAL
  Filled 2021-01-07 (×2): qty 1

## 2021-01-07 MED ORDER — ADULT MULTIVITAMIN W/MINERALS CH
1.0000 | ORAL_TABLET | Freq: Every day | ORAL | Status: DC
  Administered 2021-01-07 – 2021-01-08 (×2): 1 via ORAL
  Filled 2021-01-07 (×2): qty 1

## 2021-01-07 MED ORDER — SODIUM CHLORIDE 0.9 % IV SOLN
INTRAVENOUS | Status: DC | PRN
  Administered 2021-01-07: 250 mL via INTRAVENOUS

## 2021-01-07 MED ORDER — POTASSIUM CHLORIDE CRYS ER 20 MEQ PO TBCR
40.0000 meq | EXTENDED_RELEASE_TABLET | Freq: Once | ORAL | Status: AC
  Administered 2021-01-07: 40 meq via ORAL
  Filled 2021-01-07: qty 2

## 2021-01-07 MED ORDER — ASCORBIC ACID 500 MG PO TABS
500.0000 mg | ORAL_TABLET | Freq: Every day | ORAL | Status: DC
  Administered 2021-01-07 – 2021-01-08 (×2): 500 mg via ORAL
  Filled 2021-01-07 (×2): qty 1

## 2021-01-07 MED ORDER — VITAMIN D 25 MCG (1000 UNIT) PO TABS
1000.0000 [IU] | ORAL_TABLET | Freq: Every day | ORAL | Status: DC
  Administered 2021-01-07 – 2021-01-08 (×2): 1000 [IU] via ORAL
  Filled 2021-01-07 (×2): qty 1

## 2021-01-07 MED ORDER — CHLORHEXIDINE GLUCONATE CLOTH 2 % EX PADS
6.0000 | MEDICATED_PAD | Freq: Every day | CUTANEOUS | Status: DC
  Administered 2021-01-07 – 2021-01-08 (×2): 6 via TOPICAL

## 2021-01-07 NOTE — Progress Notes (Signed)
OT Cancellation Note  Patient Details Name: Tyler Gallagher MRN: [id number] DOB: 10/15/1961   Cancelled Treatment:    Reason Eval/Treat Not Completed: Patient declined, no reason specified;Other (comment) (despite increased encouragement and education on role of OT, pt pleasant but requesting OT to return tomorrow, 'I don't feel like getting up today I want to rest.')  [name] 01/07/2021, 11:39 AM

## 2021-01-07 NOTE — Evaluation (Signed)
Clinical/Bedside Swallow Evaluation Patient Details  Name: Tyler Gallagher MRN: [id number] Date of Birth: 1961-06-17  Today's Date: 01/07/2021 Time: SLP Start Time (ACUTE ONLY): 61 SLP Stop Time (ACUTE ONLY): 1310 SLP Time Calculation (min) (ACUTE ONLY): 12 min  Past Medical History:  Past Medical History:  Diagnosis Date  . Diabetes mellitus without complication ([place])   . Hyperlipidemia   . Hypertension   . Lesion of brain   . Lung cancer ([place])   . Mass of lung    Past Surgical History:  Past Surgical History:  Procedure Laterality Date  . 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 COLONOSCOPY    . CYSTOSCOPY W/ RETROGRADES Bilateral 07/14/2020   Procedure: CYSTOSCOPY WITH RETROGRADE PYELOGRAM;  Surgeon: Hollice Espy, MD;  Location: [facility];  Service: Urology;  Laterality: Bilateral;  . PORTA CATH INSERTION N/A 08/03/2020   Procedure: PORTA CATH INSERTION;  Surgeon: Katha Cabal, MD;  Location: [place] [facility];  Service: Cardiovascular;  Laterality: N/A;  . PORTA CATH INSERTION N/A 10/03/2020   Procedure: PORTA CATH INSERTION;  Surgeon: Algernon Huxley, MD;  Location: [place] [facility];  Service: Cardiovascular;  Laterality: N/A;  . VIDEO BRONCHOSCOPY WITH ENDOBRONCHIAL NAVIGATION N/A 07/01/2020   Procedure: VIDEO BRONCHOSCOPY WITH ENDOBRONCHIAL NAVIGATION;  Surgeon: [name], MD;  Location: [facility];  Service: Thoracic;  Laterality: N/A;  . VIDEO BRONCHOSCOPY WITH ENDOBRONCHIAL ULTRASOUND N/A 07/01/2020   Procedure: VIDEO BRONCHOSCOPY WITH ENDOBRONCHIAL ULTRASOUND;  Surgeon: [name], MD;  Location: [facility];  Service: Thoracic;  Laterality: N/A;   HPI:  60 y.o. 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 He was admitted for acute onset seizure  in the setting of progressive brain metastasis. MRI of brain revealed increase in size and hereogeneity of parasagittal L parietal lesion with new extensive surrounding edema, indicating likely progression of metastatic disease   Assessment / Plan / Recommendation Clinical Impression  Pts swallow appears grossly within functional limits. Pt reports appetite has been greatly reduced following chemotherapy. Dysgeusia is current barrier to increased PO intake. Pt with congested cough at baseline. Delayed coughing exhibited intermittently with POs, however cough suspected to be related to medical complexities (lung cancer) as opposed to concerns for aspiration. Contine regular thin liquid diet with standard aspiration precautions. No further ST needs identified.  SLP Visit Diagnosis: Dysphagia, unspecified (R13.10)    Aspiration Risk  Mild aspiration risk    Diet Recommendation   Regular, thin liquids   Medication Administration: Whole meds with liquid    Other  Recommendations     Follow up Recommendations   none     Frequency and Duration   n/a         Prognosis        Swallow Study   General Date of Onset: 01/06/21 HPI: 60 y.o. 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 He was admitted for acute onset seizure in the setting of progressive brain metastasis. MRI of brain revealed increase in size and hereogeneity of parasagittal L parietal lesion with new extensive surrounding  edema, indicating likely progression of metastatic disease Type of Study: Bedside Swallow Evaluation Previous Swallow Assessment: none on file Diet Prior to this Study: Regular;Thin liquids Temperature Spikes Noted: No Respiratory Status: Room air History of Recent Intubation:  No Behavior/Cognition: Alert;Cooperative Oral Cavity Assessment: Within Functional Limits Oral Care Completed by SLP: No Oral Cavity - Dentition: Edentulous Vision: Functional for self-feeding Self-Feeding Abilities: Able to feed self Patient Positioning: Partially reclined (reports nausea fully urpight) Baseline Vocal Quality: Normal Volitional Cough: Wet Volitional Swallow: Able to elicit    Oral/Motor/Sensory Function Overall Oral Motor/Sensory Function: Within functional limits   Ice Chips Ice chips: Not tested   Thin Liquid Thin Liquid: Impaired Presentation: Cup Pharyngeal  Phase Impairments: Suspected delayed Swallow;Multiple swallows;Cough - Delayed    Nectar Thick Nectar Thick Liquid: Not tested   Honey Thick Honey Thick Liquid: Not tested   Puree Puree: Impaired Presentation: Self Fed Pharyngeal Phase Impairments: Suspected delayed Swallow;Multiple swallows;Cough - Delayed   Solid     Solid:  (pt declined despite encouragement)      Argus Caraher E Hadas Jessop MA, CCC-SLP Acute Rehabilitation Services  01/07/2021,1:31 PM

## 2021-01-07 NOTE — Evaluation (Signed)
Physical Therapy Evaluation Patient Details Name: Tyler Gallagher MRN: [id number] DOB: [birth year] Today's Date: 01/07/2021   History of Present Illness  Pt is a 60 y.o. 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 He was admitted for acute onset seizure in the setting of progressive brain metastasis. MRI of brain revealed increase in size and hereogeneity of parasagittal L parietal lesion with new extensive surrounding edema, indicating likely progression of metastatic disease.  Clinical Impression  Pt presents with condition above and deficits mentioned below, see PT Problem List. PTA pt was independent with all functional mobility with use of AD/AE, performing all mobility with use of a w/c. He has 24/7 assistance available at home. He reports performing squat pivot transfers to either direction at home, sometimes dropping the arm rest, and not coming to full stand or ambulating at baseline. Pt A&Ox4, but displays some difficulty forming thoughts, repeating words and statements. Pt displays deficits in R leg strength, especially in his hip flexor and anterior tibialis. Also, he displays dysmetria and possible dysdiadochokinesia in his R leg, impacting his coordination and thus balance with mobility. Furthermore, his dynamic proprioception in his R leg is delayed, placing him at risk for falls and injuries. He requires increased time, multiple attempts, and max effort with heavy use of bed rails to perform all bed mob this date with min guard assist. He requires minA to move a substantial amount with lateral scooting on EOB, but pt fatigued and quickly returned to supine, declining attempting to transfer to recliner this date secondary to feeling unwell and appearing sad. Will  continue to follow acutely. Pt would benefit from intensive therapy in the CIR setting to maximize his independence and safety with all functional mobility.     Follow Up Recommendations CIR;Supervision/Assistance - 24 hour    Equipment Recommendations  3in1 (PT);Hospital bed    Recommendations for Other Services Rehab consult;Speech consult     Precautions / Restrictions Precautions Precautions: Fall Precaution Comments: L AKA; seizures Restrictions Weight Bearing Restrictions: No      Mobility  Bed Mobility Overal bed mobility: Needs Assistance Bed Mobility: Rolling;Sidelying to Sit;Sit to Supine Rolling: Min guard Sidelying to sit: Min guard   Sit to supine: Min guard   General bed mobility comments: Extra time and attempts with max effort noted to transition to sit EOB, min guard for safety. Min guard and extra time to roll and return to supine. Bed falt, heavy use of bed rails.    Transfers Overall transfer level: Needs assistance Equipment used: None Transfers: Lateral/Scoot Transfers          Lateral/Scoot Transfers: Min assist General transfer comment: Several attempts, extra time, and extra effort to attempt to scoot laterally on EOB towards the L, minA to scoot significant distance otherwise min guard for a small scoot with pt fatiguing and lying down. Pt declined transfer to recliner due to feeling unwell, reporting he squat pivots to w/c either direction soemtimes with arm dropped at home. Did not attempt to come to full stand this date reporting he does not come to full stand at home.  Ambulation/Gait             General Gait Details: Pt does not ambulate at baseline, unsafe to attempt this date.  Stairs  Wheelchair Mobility    Modified Rankin (Stroke Patients Only) Modified Rankin (Stroke Patients Only) Pre-Morbid Rankin Score: Severe disability Modified Rankin: Severe disability     Balance Overall balance assessment: Needs  assistance Sitting-balance support: Bilateral upper extremity supported;Feet supported Sitting balance-Leahy Scale: Poor Sitting balance - Comments: Reliant on UEs for support sitting EOB, min guard for safety.       Standing balance comment: Unsafe                             Pertinent Vitals/Pain Pain Assessment: No/denies pain    Home Living Family/patient expects to be discharged to:: Private residence Living Arrangements: Spouse/significant other;Children (girlfriend; 33 y.o. son) Available Help at Discharge: Family;Available 24 hours/day Type of Home: Mobile home Home Access: Ramped entrance     Home Layout: One level Home Equipment: Grab bars - toilet;Crutches;Wheelchair - manual;Wheelchair - power;Hand held shower head      Prior Function Level of Independence: Independent with assistive device(s)         Comments: Utilizes w/c for mobility, does not hop/ambulate. Depending on the day he sometimes needs assistance for dressing and bathing. Drives himself some.     Hand Dominance   Dominant Hand: Right    Extremity/Trunk Assessment   Upper Extremity Assessment Upper Extremity Assessment: Defer to OT evaluation    Lower Extremity Assessment Lower Extremity Assessment: RLE deficits/detail;LLE deficits/detail RLE Deficits / Details: MMT scores = hip flexion 4-, knee extensikon 4+, knee flexion 4-, ankle dorsiflexion 3+ RLE Sensation: history of peripheral neuropathy;decreased proprioception (delayed dynamic proprioception throughout) RLE Coordination: decreased fine motor;decreased gross motor (dysmetria noted, possible dysdiadochokinesia) LLE Deficits / Details: Hip flexion MMT 4+ LLE Sensation: WNL    Cervical / Trunk Assessment Cervical / Trunk Assessment: Normal  Communication   Communication: No difficulties  Cognition Arousal/Alertness: Awake/alert Behavior During Therapy: WFL for tasks assessed/performed (sad) Overall Cognitive Status:  No family/caregiver present to determine baseline cognitive functioning                                 General Comments: A&Ox4. Pt repeating words/statements several times and having difficulty forming thoughts during session. Unknown if this is his baseline.      General Comments      Exercises     Assessment/Plan    PT Assessment Patient needs continued PT services  PT Problem List Decreased strength;Decreased activity tolerance;Decreased balance;Decreased mobility;Decreased coordination;Decreased cognition;Impaired sensation       PT Treatment Interventions DME instruction;Functional mobility training;Therapeutic activities;Therapeutic exercise;Balance training;Neuromuscular re-education;Cognitive remediation;Patient/family education;Wheelchair mobility training    PT Goals (Current goals can be found in the Care Plan section)  Acute Rehab PT Goals Patient Stated Goal: to improve PT Goal Formulation: With patient Time For Goal Achievement: 01/21/21 Potential to Achieve Goals: Fair    Frequency Min 4X/week   Barriers to discharge        Co-evaluation               AM-PAC PT "6 Clicks" Mobility  Outcome Measure Help needed turning from your back to your side while in a flat bed without using bedrails?: A Little Help needed moving from lying on your back to sitting on the side of a flat bed without using bedrails?: A Little Help needed moving to and from a bed to a chair (including a wheelchair)?: A Lot Help needed standing up  from a chair using your arms (e.g., wheelchair or bedside chair)?: A Lot Help needed to walk in hospital room?: Total Help needed climbing 3-5 steps with a railing? : Total 6 Click Score: 12    End of Session   Activity Tolerance: Patient limited by fatigue;Other (comment) (pt feeling unwell and sad this date) Patient left: in bed;with call bell/phone within reach;with bed alarm set Nurse Communication: Mobility status PT  Visit Diagnosis: Unsteadiness on feet (R26.81);Muscle weakness (generalized) (M62.81);Other symptoms and signs involving the nervous system (R29.898)    Time: 7340-3709 PT Time Calculation (min) (ACUTE ONLY): 28 min   Charges:   PT Evaluation $PT Eval High Complexity: 1 High PT Treatments $Therapeutic Activity: 8-22 mins        Moishe Spice, PT, DPT Acute Rehabilitation Services  Pager: [phone] Office: [phone]   Orvan Falconer 01/07/2021, 10:47 AM

## 2021-01-07 NOTE — Progress Notes (Addendum)
NEUROLOGY CONSULTATION PROGRESS NOTE   Date of service: January 07, 2021 Patient Name: Tyler Gallagher MRN:  [id number] DOB:  [birth year]  Brief HPI  ZAKARIYE NEE is a 60 y.o. male with PMH significant for  has a past medical history of Diabetes mellitus without complication ([place]), Hyperlipidemia, Hypertension, Lesion of brain, Lung cancer ([place]), and Mass of lung. who presents with  Code stroke   Interval Hx   Pt is a 60 year old male with h/o stage IV lung cancer with brain metastasis who presented yesterday with Code stroke and was found to have seizure. CT head shows vasogenic edema to high left parietal lobe with suspicion of mass concerning for progression of metastasis.  MRI brain revealed edema and progression of metastatic disease. Patient was started on Keppra and dexamethasone 4 mg every 6 hours. This morning, patient is doing better, his weakness has improved and right arm and right leg.  Patient is able to move and lift right leg.  He complains of mild headache.  His blood pressure has been running low with systolic between 38-101 and diastolic between 75-10 mmHg, he is afebrile.  WBC's normal at 7.7 decreased from yesterday 14.4.  Potassium low at 3.2.  Glucose 138, QTC elevated at 517.  His symptoms are likely related to brain edema progression of metastasis.  We will reduce the dose of dexamethasone to every 8 hourly, and continue PPI, Keppra, Vitamin D and Calcium. Pt will F/U with Neuro oncology of any additional need of Radiation. Neurology will sign off.   Vitals   Vitals:   01/06/21 2315 01/06/21 2350 01/07/21 0452 01/07/21 0845  BP: 98/74 100/76 97/87 114/85  Pulse: 76 60 95 96  Resp: (!) 22 18 18 18   Temp:  (!) 97.5 F (36.4 C) (!) 97.3 F (36.3 C) (!) 97.5 F (36.4 C)  TempSrc:  Oral Oral Oral  SpO2: 97% 97% 97% 96%  Weight:  79.4 kg    Height:         Body mass index is 21.88 kg/m.  Physical Exam   General: Laying comfortably in bed; in no acute distress.   HENT: Normal oropharynx and mucosa. Normal external appearance of ears and nose.  Neck: Supple, no pain or tenderness  CV: No JVD. No peripheral edema.  Pulmonary: Symmetric Chest rise. Normal respiratory effort.  Abdomen: Soft, non distended. Ext: No cyanosis, edema, or deformity  Skin: No rash. Normal palpation of skin.   Musculoskeletal: Normal digits and nails by inspection. No clubbing.   Neurologic Examination  Mental status/Cognition: Alert, oriented to self, place, month and year, good attention.  Speech/language: Fluent, comprehension intact, object naming intact, No Aphasia Cranial nerves:   CN II Pupils equal and reactive to light, no VF deficits   CN III,IV,VI EOM intact, no gaze preference or deviation, no nystagmus    CN V normal sensation in V1, V2, and V3 segments bilaterally    CN VII no asymmetry, no nasolabial fold flattening , smiles, able to raise eye brows.   CN VIII normal hearing to speech    CN IX & X normal palatal elevation, no uvular deviation    CN XI 5/5 head turn and 5/5 shoulder shrug bilaterally    CN XII midline tongue protrusion    Motor:  Muscle bulk: Normal, tone normal,  tremor none 4/5 in upper extremities.  Patient is able to lift right lower extremity off bed.  Strength 2/5 in right lower extremity.  Reflexes:  Right Left Comments  Pectoralis      Biceps (C5/6)  2+  2+   Brachioradialis (C5/6)  2+  2+    Triceps (C6/7)  2+  2+    Patellar (L3/4)  absent     Achilles (S1)  absent     Hoffman      Plantar  downgoing    Jaw jerk    Sensation:  Light touch  sensations are symmetric to light touch in the arms.  Sensation normal in right leg.    Pin prick    Temperature    Vibration   Proprioception    Coordination/Complex Motor:  - Finger to Nose intact - Gait: Deferred  Labs   Basic Metabolic Panel:  Lab Results  Component Value Date   NA 139 01/07/2021   K 3.2 (L) 01/07/2021   CO2 26 01/07/2021   GLUCOSE 138 (H)  01/07/2021   BUN <5 (L) 01/07/2021   CREATININE 0.53 (L) 01/07/2021   CALCIUM 8.4 (L) 01/07/2021   GFRNONAA >60 01/07/2021   GFRAA >60 09/29/2020   HbA1c:  Lab Results  Component Value Date   HGBA1C 4.6 (L) 01/06/2021   LDL:  Lab Results  Component Value Date   LDLCALC UNABLE TO CALCULATE IF TRIGLYCERIDE OVER 400 mg/dL 01/01/2016   Urine Drug Screen:     Component Value Date/Time   LABOPIA NONE DETECTED 07/14/2020 0848   COCAINSCRNUR NONE DETECTED 07/14/2020 0848   LABBENZ NONE DETECTED 07/14/2020 0848   AMPHETMU NONE DETECTED 07/14/2020 0848   THCU NONE DETECTED 07/14/2020 0848   LABBARB NONE DETECTED 07/14/2020 0848    Alcohol Level     Component Value Date/Time   ETH <10 11/08/2020 0432   No results found for: PHENYTOIN, ZONISAMIDE, LAMOTRIGINE, LEVETIRACETA No results found for: PHENYTOIN, PHENOBARB, VALPROATE, CBMZ  Imaging and Diagnostic studies  Results for orders placed during the hospital encounter of 01/06/21  CT HEAD CODE STROKE WO CONTRAST  Narrative CLINICAL DATA:  Code stroke.  Neuro deficit, acute stroke suspected.  EXAM: CT HEAD WITHOUT CONTRAST  TECHNIQUE: Contiguous axial images were obtained from the base of the skull through the vertex without intravenous contrast.  COMPARISON:  MRI November 08, 2020.  CT head June 21, 2020.  FINDINGS: Brain: There is new exuberant vasogenic edema in the high left parietal lobe with suspected mass in this region (see series 3, image 24). Local sulcal effacement without midline shift. Small area of encephalomalacia in the high right parafalcine region, compatible with sequela of prior metastatic lesion resection. Remote right frontal infarct with encephalomalacia. No evidence of acute large vascular territory infarct. No acute hemorrhage. No hydrocephalus. Additional patchy white matter hypoattenuation, most likely related to chronic microvascular ischemic disease  Vascular: Calcific  atherosclerosis.  Skull: No acute fracture.  Sinuses/Orbits: Mild paranasal sinus mucosal thickening with polyp versus retention cyst in the right maxillary sinus.  Other: Small left mastoid effusion.  IMPRESSION: New vasogenic edema in the high left parietal lobe with suspected mass in this region, concerning for progression of the patient's known metastatic disease. Local sulcal effacement without midline shift. Recommend MRI with contrast to further evaluate.  Findings discussed with Dr. Milas Gain via telephone at 2:46 p.m.   Electronically Signed By: Margaretha Sheffield MD On: 01/06/2021 14:49   Impression   Tyler Gallagher is a 60 y.o. male with PMH significant for  h/o stage IV lung cancer with brain metastasis . His neurologic examination is notable for right upper and lower  extremity weakness which has improved since yesterday. CT head shows vasogenic edema to high left parietal lobe with suspicion of mass concerning for progression of metastasis.  MRI brain revealed edema and progression of metastatic disease. Patient was started on Keppra, dexamethasone 4 mg every 6 hours and  PPI.  No seizure activity reported today. WBC's normal at 7.7 decreased from yesterday 14.4.  Potassium low at 3.2.  Glucose 138, QTC elevated at 517.  Glucose 138/ His symptoms are likely related to brain edema and progression of metastasis.  We will reduce the dose of dexamethasone to TID, and continue PPI, Keppra, Vitamin D and Calcium. Pt will F/U with Neuro oncology of any additional need of Radiation. Neurology will sign off.   Recommendations  - Reduce Dexamethasone to 4 mg TID change to PO. -Continue Keppra 500 mg p.o. twice daily. - Replete K. - Continue PPI -Continue Calcium and Vitamin D. -SBP goal- normotensive. -Seizure precautions. -Continue PT/ OT - No further Neurological testing needed in Inpatient. -Pt will F/U with Neuro Oncologist on Outpatient basis.   ___________________________________________________________________   Thank you for the opportunity to take part in the care of this patient. If you have any further questions, please contact the neurology consultation attending.  Signed,  Dr. Armando Reichert MD PGY1 [facility] Neurology

## 2021-01-07 NOTE — Progress Notes (Signed)
Inpatient Rehab Admissions Coordinator:  Consult received.  Note pt is under observation status at this time.  Pt may not have the medical necessity to warrant an inpatient rehab stay if they remain observation.  Will continue to monitor medical workup.   Gayland Curry, [place], [place] Admissions Coordinator [phone]

## 2021-01-07 NOTE — Progress Notes (Signed)
Subjective:   Tyler Gallagher states he is feeling generally weak today but is happy that his right leg is getting stronger compared to yesterday. He denies any dizziness or headache. We discussed that admission was secondary to seizure and he will need daily medication and close neurology follow up.   Objective:  Vital signs in last 24 hours: Vitals:   01/06/21 2300 01/06/21 2315 01/06/21 2350 01/07/21 0452  BP: 98/71 98/74 100/76 97/87  Pulse:  76 60 95  Resp: 19 (!) 22 18 18   Temp:   (!) 97.5 F (36.4 C) (!) 97.3 F (36.3 C)  TempSrc:   Oral Oral  SpO2:  97% 97% 97%  Weight:   79.4 kg   Height:       Physical Exam Vitals and nursing note reviewed.  Constitutional:      General: He is not in acute distress.    Appearance: He is normal weight. He is ill-appearing.  HENT:     Head: Normocephalic and atraumatic.  Eyes:     Extraocular Movements: Extraocular movements intact.     Pupils: Pupils are equal, round, and reactive to light.  Skin:    General: Skin is warm and dry.     Coloration: Skin is pale.  Neurological:     Mental Status: He is alert.     Comments: 4/5 strength in bilateral upper extremities. 3/5 in lower right extremity. Left AKA. Patient is slow to respond and takes time to form thoughts but answers all questions appropriately.     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   # Seizure  # Brain Metastasis Significant vasogenic edema with  local sulcal effacement, although no midline shift, in the setting of progressive brain metastasis versus reactive changes to whole brain radiation. Seizure activity aborted with Ativan and since has been loaded with Keppra and Decadron. No  additional seizure-like activity noted. PT recommending CIR; patient is willing to consider.   - Neurology following, appreciate their recommendations  - Keppra 1500 mg BID - Ativan PRN for seizure-like activity > 3 minutes  - Decadron tapered to 4 mg TID with plans to continue on discharge  - PPI daily while on steroid therapy  - Outpatient Neuro-Oncology follow up recommended - CIR consult   # Hx of Inappropriate Sinus Tachycardia  # Hx of Atrial Fibrillation Repeat EKG with normal sinus rhythm at this time. No additional tachycardia since IVF resuscitation, so likely secondary to dehydration. Will continue to monitor while admitted.   - Telemetry   # Hypokalemia  # Hypomagnesemia  History of requiring IV potassium infusions through Oncology. In the setting of chronic intermittent diarrhea and nausea due to chemotherapy and radiation. Potassium has improved today to 3.2. Patient tolerated oral replenishment, so will continue today.   - 40 mEq Kdur  # Malnutrition In the setting of decreased appetite secondary to recent chemotherapy and radiation. Patient is having a PEG placed next week per Oncology orders to begin supplementing diet.   - MVI with minerals, Vitamin D, Vitamin C - Consult to dietician   # Type 2 Diabetes Mellitus  Last A1c of 7.7%. Not on any anti-glycemic agents at home.   - SSI (moderate)   #  Stage IV Adenocarcinoma of the Lung Currently undergoing treatment with Keytruda and has received 2 infusions. He previously received 6 cycles of Carboplatin/Paclitaxel, as well as whole brain radiation for brain metastasis. Will need to continue following with Oncology once discharged.   # AGMA: Resolved   Diet: Normal VTE: Enoxaparin IVF: N/A Code: Full  Prior to Admission Living Arrangement: Home, living with girlfriend Anticipated Discharge Location: Home with HH versus CIR. Barriers to Discharge: Placement    Dispo: Anticipated discharge in  approximately 0-1 day(s). Patient is medically stable for discharge.   Dr. Jose Persia Internal Medicine PGY-2  Pager: [phone] After 5pm on weekdays and 1pm on weekends: On Call pager [phone]  01/07/2021, 6:49 AM

## 2021-01-08 LAB — MAGNESIUM: Magnesium: 2 mg/dL (ref 1.7–2.4)

## 2021-01-08 LAB — VITAMIN D 25 HYDROXY (VIT D DEFICIENCY, FRACTURES): Vit D, 25-Hydroxy: 16.36 ng/mL — ABNORMAL LOW (ref 30–100)

## 2021-01-08 LAB — BASIC METABOLIC PANEL
Anion gap: 9 (ref 5–15)
BUN: <5 mg/dL — ABNORMAL LOW (ref 6–20)
CO2: 26 mmol/L (ref 22–32)
Calcium: 8.2 mg/dL — ABNORMAL LOW (ref 8.9–10.3)
Chloride: 104 mmol/L (ref 98–111)
Creatinine, Ser: 0.54 mg/dL — ABNORMAL LOW (ref 0.61–1.24)
GFR, Estimated: >60 mL/min (ref >60)
Glucose, Bld: 112 mg/dL — ABNORMAL HIGH (ref 70–99)
Potassium: 3.5 mmol/L (ref 3.5–5.1)
Sodium: 139 mmol/L (ref 135–145)

## 2021-01-08 LAB — GLUCOSE, CAPILLARY
Glucose-Capillary: 111 mg/dL — ABNORMAL HIGH (ref 70–99)
Glucose-Capillary: 108 mg/dL — ABNORMAL HIGH (ref 70–99)
Glucose-Capillary: 128 mg/dL — ABNORMAL HIGH (ref 70–99)

## 2021-01-08 MED ORDER — LEVETIRACETAM 500 MG PO TABS: 500.0000 mg | ORAL_TABLET | Freq: Two times a day (BID) | ORAL | 0 refills | Status: AC

## 2021-01-08 MED ORDER — DEXAMETHASONE 4 MG PO TABS: 4.0000 mg | ORAL_TABLET | Freq: Three times a day (TID) | ORAL | 0 refills | Status: DC

## 2021-01-08 MED ORDER — LEVETIRACETAM 500 MG PO TABS
500.0000 mg | ORAL_TABLET | Freq: Two times a day (BID) | ORAL | Status: DC
  Administered 2021-01-08: 500 mg via ORAL
  Filled 2021-01-08: qty 1

## 2021-01-08 MED ORDER — DEXAMETHASONE 4 MG PO TABS
4.0000 mg | ORAL_TABLET | Freq: Three times a day (TID) | ORAL | Status: DC
  Administered 2021-01-08: 4 mg via ORAL
  Filled 2021-01-08: qty 1

## 2021-01-08 MED ORDER — HEPARIN SOD (PORK) LOCK FLUSH 100 UNIT/ML IV SOLN
500.0000 [IU] | INTRAVENOUS | Status: AC | PRN
  Administered 2021-01-08: 500 [IU]
  Filled 2021-01-08: qty 5

## 2021-01-08 NOTE — Care Management Obs Status (Signed)
[place] NOTIFICATION   Patient Details  Name: Tyler Gallagher MRN: [id number] Date of Birth: December 18, 1961   Medicare Observation Status Notification Given:  Yes    Pollie Friar, RN 01/08/2021, 2:50 PM

## 2021-01-08 NOTE — Progress Notes (Signed)
Pt port deaccessed and pt walked off unit with this RN along with step son and all of his belongings by his side.

## 2021-01-08 NOTE — Progress Notes (Signed)
Occupational Therapy Evaluation Patient Details Name: Tyler Gallagher MRN: [id number] DOB: [birth year] Today's Date: 01/08/2021    History of Present Illness Pt is a 60 y.o. 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 He was admitted for acute onset seizure in the setting of progressive brain metastasis. MRI of brain revealed increase in size and hereogeneity of parasagittal L parietal lesion with new extensive surrounding edema, indicating likely progression of metastatic disease.   Clinical Impression   PTA pt lives at home with family and has required increased assistance with ADL tasks for the past month due to generalized weakness. Pt completed squat pivot transfaer to chair with min guard assist. Min A with LB ADL. Discussed compensatory strategies for energy conservation and recommend drop arm BSC and hospital bed to conserve energy and reduce risk of falls. Recommend HHOT to maximize functional independence, reduce risk of falls at home and decrease burden of caregiver support. Recommend palliative consult.     Follow Up Recommendations  Other (comment);Home health OT;Supervision/Assistance - 24 hour    Equipment Recommendations  3 in 1 bedside commode (drop arm)    Recommendations for Other Services  (palliative consult as outpt/home health)     Precautions / Restrictions Precautions Precautions: Fall Precaution Comments: L AKA; seizures      Mobility Bed Mobility Overal bed mobility: Modified Independent             General bed mobility comments: heavy use of rails    Transfers Overall transfer level: Needs assistance   Transfers: Squat Pivot Transfers     Squat pivot transfers: Min guard     General transfer comment: close S; no  physical assistance needed    Balance     Sitting balance-Leahy Scale: Fair                                     ADL either performed or assessed with clinical judgement   ADL Overall ADL's : Needs assistance/impaired   Eating/Feeding Details (indicate cue type and reason): poor PO intake Grooming: Set up;Sitting   Upper Body Bathing: Set up;Sitting   Lower Body Bathing: Minimal assistance;Bed level;Sitting/lateral leans   Upper Body Dressing : Set up;Sitting   Lower Body Dressing: Minimal assistance;Sit to/from stand;Sitting/lateral leans   Toilet Transfer: Minimal assistance;Squat-pivot   Toileting- Clothing Manipulation and Hygiene: Minimal assistance       Functional mobility during ADLs: Minimal assistance       Vision   Additional Comments: no complaints of changes     Perception     Praxis      Pertinent Vitals/Pain Pain Assessment: No/denies pain     Hand Dominance Right   Extremity/Trunk Assessment Upper Extremity Assessment Upper Extremity Assessment: Generalized weakness   Lower Extremity Assessment Lower Extremity Assessment: Defer to PT evaluation   Cervical / Trunk Assessment Cervical / Trunk Assessment: Normal   Communication Communication Communication: No difficulties   Cognition Arousal/Alertness: Awake/alert Behavior During Therapy: Flat affect Overall Cognitive Status: No family/caregiver present to determine baseline cognitive functioning  General Comments: deficits noted with attention adn problenm solving; decreased STM; slow processing   General Comments  Pt will benefit from theraband strengthening - states he has bands at home    Exercises     Shoulder Instructions      Home Living Family/patient expects to be discharged to:: Private residence Living Arrangements: Spouse/significant other;Children Available Help at Discharge: Family;Available 24  hours/day Type of Home: Mobile home Home Access: Ramped entrance     Home Layout: One level     Bathroom Shower/Tub: Walk-in shower;Tub only   Bathroom Toilet: Standard Bathroom Accessibility: Yes   Home Equipment: Grab bars - toilet;Crutches;Wheelchair - manual;Wheelchair - power;Hand held shower head          Prior Functioning/Environment Level of Independence: Independent with assistive device(s);Needs assistance  Gait / Transfers Assistance Needed: uses wc ADL's / Homemaking Assistance Needed: Girlfriend has been helping for @ 1 month because of increased weakness and "no energy"; states he is having a feeding tube placed next week   Comments: Utilizes w/c for mobility, does not hop/ambulate. Depending on the day he sometimes needs assistance for dressing and bathing. Drives himself some.        OT Problem List: Decreased strength;Decreased activity tolerance;Impaired balance (sitting and/or standing);Decreased cognition;Decreased safety awareness;Decreased knowledge of use of DME or AE      OT Treatment/Interventions:      OT Goals(Current goals can be found in the care plan section) Acute Rehab OT Goals Patient Stated Goal: to get stronger adn begin to eat OT Goal Formulation: All assessment and education complete, DC therapy ([place] with HHOT)  OT Frequency:     Barriers to D/C:            Co-evaluation              AM-PAC OT "6 Clicks" Daily Activity     Outcome Measure Help from another person eating meals?: None Help from another person taking care of personal grooming?: A Little Help from another person toileting, which includes using toliet, bedpan, or urinal?: A Little Help from another person bathing (including washing, rinsing, drying)?: A Little Help from another person to put on and taking off regular upper body clothing?: A Little Help from another person to put on and taking off regular lower body clothing?: A Little 6 Click Score: 19    End of Session Equipment Utilized During Treatment: Gait belt Nurse Communication: Mobility status;Other (comment) (DC needs)  Activity Tolerance: Patient tolerated treatment well Patient left: in chair;with call bell/phone within reach;with chair alarm set  OT Visit Diagnosis: Other abnormalities of gait and mobility (R26.89);Muscle weakness (generalized) (M62.81);Other symptoms and signs involving cognitive function                Time: 1245-1316 OT Time Calculation (min): 31 min Charges:  OT General Charges $OT Visit: 1 Visit OT Evaluation $OT Eval Moderate Complexity: [place], OT/L   Acute OT Clinical Specialist Acute Rehabilitation Services Pager [phone] Office [phone]   [facility] 01/08/2021, 2:13 PM

## 2021-01-08 NOTE — Progress Notes (Signed)
Inpatient Rehab Admissions Coordinator:  Pt continues to remain observation status.  Also note pt is going home with HH.  Will sign off.  Gayland Curry, [place], [place] Admissions Coordinator [phone]

## 2021-01-08 NOTE — Progress Notes (Cosign Needed)
Hospital bed:  Length of Need 6 Months  The above medical condition requires: Patient requires the ability to reposition frequently  Head must be elevated greater than: 30 degrees  Bed type Semi-electric  Support Surface: Gel Overlay

## 2021-01-08 NOTE — TOC Transition Note (Addendum)
Transition of Care [facility]) - CM/SW Discharge Note   Patient Details  Name: YOVANY CLOCK MRN: [id number] Date of Birth: 1961/07/31  Transition of Care [facility]) CM/SW Contact:  Pollie Friar, RN Phone Number: 01/08/2021, 2:43 PM   Clinical Narrative:    Pt is refusing CIR and SNF. He is asking to d/c home. CM spoke to his girlfriend: Karie Kirks and she is in agreement. CM also spoke to pts daughter and she prefers rehab but is willing to assist at home.  Orders for hospital bed and drop arm bedside commode. [employer] able to send commode to his home but dont have any hospital beds. CM will contact [employer] for delivery to the home.  [facility] [place] services arranged through [place]. Cory with [facility] aware of orders and d/c today. Son to provide transport home.  1500: bed for home arranged through [place]. The bed wont be delivered until tomorrow or after and family is ok with this plan.    Final next level of care: Home w Home Health Services Barriers to Discharge: No Barriers Identified   Patient Goals and CMS Choice   CMS Medicare.gov Compare Post Acute Care list provided to:: Patient Represenative (must comment) Choice offered to / list presented to : Adult Children (significant other)  Discharge Placement                       Discharge Plan and Services                DME Arranged: 3-N-1,Hospital bed   Date DME Agency Contacted: 01/08/21   Representative spoke with at DME Agency: [employer] for drop arm BSC/ [facility] for the hospital bed [facility] Arranged: PT,OT,Nurse's Aide,Social Work [place]: [place] Date [place]: 01/08/21   Representative spoke with at [place]: [place] ([place]) Interventions     Readmission Risk Interventions No flowsheet data found.

## 2021-01-08 NOTE — Progress Notes (Signed)
Ok to change dexamethasone and keppra per neuro's note and Dr. Laural Golden.  Keppra 500mg  PO BID Dexamethasone 4mg  PO TID  Onnie Boer, PharmD, BCIDP, AAHIVP, CPP Infectious Disease Pharmacist 01/08/2021 7:37 AM

## 2021-01-08 NOTE — Progress Notes (Signed)
Subjective:   Patient reports feeling okay this morning.  States that he is ready to go home.  He feels that if he stays in the hospital that he will mostly be lying in bed all day.  States that he would consider home health PT but is not sure how much this would help.  States he has weights at home for strength training.  He states he is scheduled to get a G-tube placed in about a week and would rather be at home.  States that his right leg weakness feels somewhat improved.  Denies any headaches or other symptoms at this time.  Understands the importance of starting Keppra.  Objective:  Vital signs in last 24 hours: Vitals:   01/07/21 1958 01/07/21 2358 01/08/21 0350 01/08/21 0843  BP: 111/83 105/75 98/70 108/82  Pulse: 87 90 90 86  Resp: 18 17 17 18   Temp: (!) 97.3 F (36.3 C) (!) 97.5 F (36.4 C) (!) 97.5 F (36.4 C) (!) 97.3 F (36.3 C)  TempSrc: Oral Oral Oral Oral  SpO2: 94% 94%  96%  Weight:      Height:       Physical Exam Vitals and nursing note reviewed.  Constitutional:      General: He is not in acute distress.    Appearance: He is normal weight. He is ill-appearing.  HENT:     Head: Normocephalic and atraumatic.  Eyes:     Extraocular Movements: Extraocular movements intact.     Pupils: Pupils are equal, round, and reactive to light.  Skin:    General: Skin is warm and dry.     Coloration: Skin is pale.  Neurological:     Mental Status: He is alert.     Comments: 4/5 strength in bilateral upper extremities. 3/5 in lower right extremity. Left AKA. Patient is slow to respond and takes time to form thoughts but answers all questions appropriately.     Assessment/Plan:  Active Problems:   Brain metastases ([place])   Seizure [facility])  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   Seizure  Brain Metastasis Significant vasogenic edema with  local sulcal effacement, although no midline shift, in the setting of progressive brain metastasis versus reactive changes to whole brain radiation. Seizure activity aborted with Ativan and since has been loaded with Keppra and Decadron. No additional seizure-like activity noted. PT recommending CIR; patient states that he would like to go home at this time.  Discussed setting up home health PT for which the patient is amenable to. - Neurology signed off, appreciate their recommendations - Continue Keppra 500 mg twice daily and dexamethasone 4 mg 3 times daily - Ativan PRN for seizure-like activity > 3 minutes  - PPI daily while on steroid therapy  - Outpatient Neuro-Oncology follow up recommended  Hx of Inappropriate Sinus Tachycardia  Hx of Atrial Fibrillation Normal sinus rhythm and rate since admission.  Continue to monitor while admitted. - Telemetry   Hypokalemia  Hypomagnesemia  Potassium 3.5, mag 2.0. -Trend BMP and mag  Malnutrition In the setting of decreased appetite secondary to recent chemotherapy and radiation. Patient is having a PEG placed next week per Oncology orders to begin supplementing diet.  - MVI with minerals, Vitamin D, Vitamin C - Consult  to dietician    Diet: Normal VTE: Enoxaparin IVF: N/A Code: Full  Prior to Admission Living Arrangement: Home, living with girlfriend Anticipated Discharge Location: Home with [facility] versus CIR. Barriers to Discharge: Placement    Dispo: Anticipated discharge is later today.  [place] Internal Medicine PGY-2  Pager: [phone] After 5pm on weekdays and 1pm on weekends: On Call pager [phone]  01/08/2021, 11:40 AM

## 2021-01-09 ENCOUNTER — Ambulatory Visit (INDEPENDENT_AMBULATORY_CARE_PROVIDER_SITE_OTHER): Payer: Medicare HMO | Admitting: Surgery

## 2021-01-09 ENCOUNTER — Inpatient Hospital Stay: Payer: Medicare HMO

## 2021-01-09 ENCOUNTER — Encounter: Payer: Self-pay | Admitting: Surgery

## 2021-01-09 ENCOUNTER — Inpatient Hospital Stay (HOSPITAL_BASED_OUTPATIENT_CLINIC_OR_DEPARTMENT_OTHER): Payer: Medicare HMO | Admitting: Nurse Practitioner

## 2021-01-09 ENCOUNTER — Other Ambulatory Visit: Payer: Self-pay

## 2021-01-09 VITALS — BP 108/76 | HR 96 | Temp 97.3°F | Resp 18

## 2021-01-09 VITALS — BP 112/76 | HR 109 | Temp 98.8°F | Ht 75.0 in | Wt 160.0 lb

## 2021-01-09 DIAGNOSIS — E119 Type 2 diabetes mellitus without complications: Secondary | ICD-10-CM | POA: Diagnosis not present

## 2021-01-09 DIAGNOSIS — C3490 Malignant neoplasm of unspecified part of unspecified bronchus or lung: Secondary | ICD-10-CM | POA: Diagnosis not present

## 2021-01-09 DIAGNOSIS — C3431 Malignant neoplasm of lower lobe, right bronchus or lung: Secondary | ICD-10-CM | POA: Diagnosis not present

## 2021-01-09 DIAGNOSIS — E876 Hypokalemia: Secondary | ICD-10-CM

## 2021-01-09 DIAGNOSIS — Z87891 Personal history of nicotine dependence: Secondary | ICD-10-CM | POA: Diagnosis not present

## 2021-01-09 DIAGNOSIS — E860 Dehydration: Secondary | ICD-10-CM | POA: Diagnosis not present

## 2021-01-09 DIAGNOSIS — C7931 Secondary malignant neoplasm of brain: Secondary | ICD-10-CM | POA: Diagnosis not present

## 2021-01-09 DIAGNOSIS — E440 Moderate protein-calorie malnutrition: Secondary | ICD-10-CM

## 2021-01-09 DIAGNOSIS — Z7952 Long term (current) use of systemic steroids: Secondary | ICD-10-CM | POA: Diagnosis not present

## 2021-01-09 DIAGNOSIS — R569 Unspecified convulsions: Secondary | ICD-10-CM | POA: Diagnosis not present

## 2021-01-09 DIAGNOSIS — I10 Essential (primary) hypertension: Secondary | ICD-10-CM | POA: Diagnosis not present

## 2021-01-09 DIAGNOSIS — Z5112 Encounter for antineoplastic immunotherapy: Secondary | ICD-10-CM | POA: Diagnosis not present

## 2021-01-09 MED ORDER — HEPARIN SOD (PORK) LOCK FLUSH 100 UNIT/ML IV SOLN
500.0000 [IU] | Freq: Once | INTRAVENOUS | Status: AC
  Administered 2021-01-09: 500 [IU] via INTRAVENOUS
  Filled 2021-01-09: qty 5

## 2021-01-09 MED ORDER — SODIUM CHLORIDE 0.9% FLUSH
10.0000 mL | INTRAVENOUS | Status: DC | PRN
  Administered 2021-01-09: 10 mL via INTRAVENOUS
  Filled 2021-01-09: qty 10

## 2021-01-09 MED ORDER — SODIUM CHLORIDE 0.9 % IV SOLN
Freq: Once | INTRAVENOUS | Status: AC
  Filled 2021-01-09: qty 250

## 2021-01-09 NOTE — Progress Notes (Signed)
Pt was add on because he had seizures and was started on keppra and decadron. The pt says he is not sure if he got the keppra  , but he did take the steroid.

## 2021-01-09 NOTE — Patient Instructions (Signed)
Our surgery scheduler will call you within 24-48 hours to schedule your surgery. Please have the Blue surgery sheet available when speaking with her.  

## 2021-01-09 NOTE — Progress Notes (Signed)
Patient ID: Tyler Gallagher, male   DOB: [birth year], 60 y.o.   MRN: [id number]  HPI Tyler Gallagher is a 60 y.o. male in consultation at the request of [name] for placement of gastrostomy tube.  Patient has a known history of lung cancer with brain metastases and liver metastases.  Recently hospitalized for recurrent falls and seizures.  He continues to lose weight and has very poor p.o. intake.  He just recently failed within the bottom of our office.  Currently this was unwitnessed and patient does not have any gross sequelae from the fall.  He does not have any injuries or bruises.  He is accompanied by his girlfriend.  He had a recent CT of the chest abdomen pelvis that I have personally reviewed showing evidence of right lower lobe mass and liver metastasis.  There is no other acute intra-abdominal abnormalities.  Also BMP was normal , INR and CBC were normal. 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  She is a wheelchair  HPI  Past Medical History:  Diagnosis Date  . Diabetes mellitus without complication ([place])   . Hyperlipidemia   . Hypertension   . Lesion of brain   . Lung cancer ([place])   . Mass of lung     Past Surgical History:  Procedure Laterality Date  . 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 COLONOSCOPY    . CYSTOSCOPY W/ RETROGRADES Bilateral 07/14/2020   Procedure: CYSTOSCOPY WITH RETROGRADE PYELOGRAM;  Surgeon: Hollice Espy, MD;  Location: [facility] ORS;  Service: Urology;  Laterality: Bilateral;  . PORTA CATH INSERTION N/A 08/03/2020   Procedure: PORTA CATH INSERTION;  Surgeon: Katha Cabal, MD;  Location: [place] CV LAB;  Service: Cardiovascular;  Laterality: N/A;  . PORTA CATH INSERTION N/A 10/03/2020   Procedure: PORTA CATH INSERTION;  Surgeon: Algernon Huxley, MD;  Location: [place] CV LAB;  Service: Cardiovascular;  Laterality: N/A;  . VIDEO BRONCHOSCOPY WITH ENDOBRONCHIAL NAVIGATION N/A 07/01/2020   Procedure: VIDEO BRONCHOSCOPY WITH  ENDOBRONCHIAL NAVIGATION;  Surgeon: [name], MD;  Location: [facility] ORS;  Service: Thoracic;  Laterality: N/A;  . VIDEO BRONCHOSCOPY WITH ENDOBRONCHIAL ULTRASOUND N/A 07/01/2020   Procedure: VIDEO BRONCHOSCOPY WITH ENDOBRONCHIAL ULTRASOUND;  Surgeon: [name], MD;  Location: [facility] ORS;  Service: Thoracic;  Laterality: N/A;    Family History  Problem Relation Age of Onset  . Diabetes Sister   . Diabetes Brother     Social History Social History   Tobacco Use  . Smoking status: Former Smoker    Packs/day: 2.00    Years: 32.00    Pack years: 64.00    Types: Cigarettes    Quit date: 06/21/2020    Years since quitting: 0.5  . Smokeless tobacco: Never Used  Vaping Use  . Vaping Use: Never used  Substance Use Topics  . Alcohol use: No    Alcohol/week: 0.0 standard drinks  . Drug use: Not Currently    Types: Marijuana    Allergies  Allergen Reactions  . Penicillins Other (See Comments)    "everything felt distant"    Current Outpatient Medications  Medication Sig Dispense Refill  . dexamethasone (DECADRON) 4 MG tablet Take 1 tablet (4 mg total) by mouth 3 (three) times daily with meals. 90 tablet 0  . dronabinol (MARINOL) 5 MG capsule Take 1 capsule (5 mg total) by mouth 2 (two) times daily before a meal. 60 capsule 0  . levETIRAcetam (KEPPRA)  500 MG tablet Take 1 tablet (500 mg total) by mouth 2 (two) times daily. 60 tablet 0  . metoprolol succinate (TOPROL-XL) 25 MG 24 hr tablet Take 0.5 tablets (12.5 mg total) by mouth daily. 15 tablet 5  . potassium chloride SA (KLOR-CON) 20 MEQ tablet Take 1 tablet (20 mEq total) by mouth 2 (two) times daily. 28 tablet 0  . vancomycin (VANCOCIN) 50 mg/mL oral solution Take 2.5 mLs (125 mg total) by mouth 4 (four) times daily.    . vitamin B-12 (CYANOCOBALAMIN) 1000 MCG tablet Take 1,000 mcg by mouth daily.     No current facility-administered medications for this visit.   Facility-Administered Medications Ordered in Other Visits   Medication Dose Route Frequency Provider Last Rate Last Admin  . heparin lock flush 100 unit/mL  500 Units Intravenous Once Charlaine Dalton R, MD      . sodium chloride flush (NS) 0.9 % injection 10 mL  10 mL Intravenous Once Cammie Sickle, MD         Review of Systems Full ROS  was asked and was negative except for the information on the HPI  Physical Exam Blood pressure 112/76, pulse (!) 109, temperature 98.8 F (37.1 C), temperature source Oral, height 6\' 3"  (1.905 m), weight 160 lb (72.6 kg), SpO2 98 %. CONSTITUTIONAL: debilitated and malnourishedHe is sitting in a wheelchair and appears fatigued EYES: Pupils are equal, round, Sclera are non-icteric. EARS, NOSE, MOUTH AND THROAT: He is wearing a mask. Hearing is intact to voice. LYMPH NODES:  Lymph nodes in the neck are normal. RESPIRATORY:  Lungs are clear. There is normal respiratory effort, with equal breath sounds bilaterally, and without pathologic use of accessory muscles. CARDIOVASCULAR: Heart is regular without murmurs, gallops, or rubs. GI: The abdomen is  soft, nontender, and nondistended. There are no palpable masses. There is no hepatosplenomegaly. There are normal bowel sounds in all quadrants. GU: Rectal deferred.   MUSCULOSKELETAL: decrease strength duet to deconditioning , Left AKA stump SKIN: Turgor is good and there are no pathologic skin lesions or ulcers. NEUROLOGIC: Motor and sensation is grossly normal. Cranial nerves are grossly intact. PSYCH:  Oriented to person, place and time. Affect is normal.  Data Reviewed  I have personally reviewed the patient's imaging, laboratory findings and medical records.    Assessment/Plan Metastatic lung cancer and failure to thrive and malnutrition.  Discussed with the patient in detail about gastrostomy tube.  There is no  contraindication for gastrostomy tube.  I do think that he might be a reasonable candidate for robotic approach.  Procedure discussed with  patient in detail.  Risks, benefits and possible applications including but not limited to: Bleeding, infection, bowel injuries, rate interventions and risk associated with anesthetic.  Overall he is at high risk for any perioperative morbidity and mortality given his advanced metastatic disease.  He does understand this and wishes to proceed.    Caroleen Hamman, MD FACS General Surgeon 01/09/2021, 4:13 PM

## 2021-01-09 NOTE — Progress Notes (Signed)
[facility] [place]  [phone] [phone]  Patient Care Team: Juluis Pitch, MD as PCP - General (Family Medicine) Kate Sable, MD as PCP - Cardiology (Cardiology) Telford Nab, RN as Oncology Nurse Navigator   Name of the patient: Tyler Gallagher  [id number]  27-Jul-1961   Date of visit: 01/09/21  Diagnosis-metastatic lung cancer  Chief complaint/ Reason for visit-seizure  Heme/Onc history:  Oncology History  Malignant neoplasm of lower lobe of right lung ([place])  07/07/2020 Cancer Staging   Staging form: Lung, AJCC 8th Edition - Clinical stage from 07/07/2020: Stage IVA (cT2a, cN0, cM1b) - Signed by [name], MD on 07/08/2020   07/08/2020 Initial Diagnosis   Malignant neoplasm of lower lobe of right lung ([place])   08/15/2020 - 10/20/2020 Chemotherapy   The patient had dexamethasone (DECADRON) 4 MG tablet, 8 mg, Oral, Daily, 1 of 1 cycle, Start date: 07/26/2020, End date: 08/15/2020 palonosetron (ALOXI) injection 0.25 mg, 0.25 mg, Intravenous,  Once, 7 of 7 cycles Administration: 0.25 mg (08/15/2020), 0.25 mg (09/22/2020), 0.25 mg (09/29/2020), 0.25 mg (09/15/2020), 0.25 mg (10/06/2020), 0.25 mg (10/13/2020), 0.25 mg (10/20/2020) CARBOplatin (PARAPLATIN) 300 mg in sodium chloride 0.9 % 250 mL chemo infusion, 300 mg (100 % of original dose 300 mg), Intravenous,  Once, 7 of 7 cycles Dose modification:   (original dose 300 mg, Cycle 1) Administration: 300 mg (09/22/2020), 300 mg (09/29/2020), 300 mg (09/15/2020), 300 mg (10/06/2020), 300 mg (10/13/2020), 300 mg (10/20/2020) PACLitaxel (TAXOL) 186 mg in sodium chloride 0.9 % 250 mL chemo infusion (</= 80mg /m2), 80 mg/m2 = 186 mg, Intravenous,  Once, 7 of 7 cycles Dose modification: 65 mg/m2 (original dose 80 mg/m2, Cycle 5, Reason: Dose not tolerated) Administration: 186 mg (09/22/2020), 186 mg (09/29/2020), 186 mg (09/15/2020), 150 mg (10/06/2020), 150 mg (10/13/2020), 150 mg  (10/20/2020)  for chemotherapy treatment.    12/15/2020 -  Chemotherapy    Patient is on Treatment Plan: LUNG NSCLC FLAT DOSE PEMBROLIZUMAB Q21D        Interval history- Kodie Kishi, 60 year old male diagnosed with metastatic lung cancer s/p chemotherapy after diagnosis in June 2021 then resection of brain mass in 06/2020 followed by whole brain radiation currently on Keytruda who presented to the ER on 01/06/2021 for seizure-like activity.  CTh showed vasogenic edema and mass concerning for progression of brain mets.  Negative for stroke.  Per chart review he had an incomplete Keppra taper after 721 craniotomy and no AED since then.  He received loading Keppra followed by Keppra 1500 mg by mouth every 12 hours with as needed Ativan along with Decadron.  MRI of the brain showed partially necrotic lesion measuring 1.6 x 1.2 with extensive surrounding edema (previously 0.6 cm).  Findings thought to be secondary to progressive metastatic disease however given history of radiation, disproportionate edema, appearance of enhancement, treatment effect was also a consideration.  No other metastases noted in previous enhancement of the left frontoparietal region were resolved.  Patient was discharged home on 01/08/2021.  He is not yet started Keppra at home. Patient says prior to seizure he felt 'jittery' but no visual changes. He's had ongoing double vision and seeing black spots. Has felt increasingly tired since seizure but no other new or residual effects by his report.  Rosalind, patient's girlfriend, participated in visit by phone. She says patient has been depressed and needs increased assistance around the house due to fatigue. She questions being able to care for him long term.  ECOG FS:3 - Symptomatic, >50% confined to bed  Review of systems- Review of Systems  Constitutional: Positive for malaise/fatigue and weight loss. Negative for chills and fever.  HENT: Negative for hearing loss, nosebleeds,  sore throat and tinnitus.   Eyes: Positive for double vision. Negative for blurred vision.       'sees spots'  Respiratory: Negative for cough, hemoptysis, shortness of breath and wheezing.   Cardiovascular: Negative for chest pain, palpitations and leg swelling.  Gastrointestinal: Negative for abdominal pain, blood in stool, constipation, diarrhea, melena, nausea and vomiting.  Genitourinary: Negative for dysuria and urgency.  Musculoskeletal: Negative for back pain, falls, joint pain and myalgias.  Skin: Negative for itching and rash.  Neurological: Positive for seizures (none since hospitalization). Negative for dizziness, tingling, sensory change, loss of consciousness, weakness and headaches.  Endo/Heme/Allergies: Negative for environmental allergies. Does not bruise/bleed easily.  Psychiatric/Behavioral: Negative for depression. The patient is not nervous/anxious and does not have insomnia.      Allergies  Allergen Reactions  . Penicillins Other (See Comments)    "everything felt distant"    Past Medical History:  Diagnosis Date  . Diabetes mellitus without complication ([place])   . Hyperlipidemia   . Hypertension   . Lesion of brain   . Lung cancer ([place])   . Mass of lung     Past Surgical History:  Procedure Laterality Date  . 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 COLONOSCOPY    . CYSTOSCOPY W/ RETROGRADES Bilateral 07/14/2020   Procedure: CYSTOSCOPY WITH RETROGRADE PYELOGRAM;  Surgeon: Hollice Espy, MD;  Location: [facility];  Service: Urology;  Laterality: Bilateral;  . PORTA CATH INSERTION N/A 08/03/2020   Procedure: PORTA CATH INSERTION;  Surgeon: Katha Cabal, MD;  Location: [facility];  Service: Cardiovascular;  Laterality: N/A;  . PORTA CATH INSERTION N/A 10/03/2020   Procedure: PORTA CATH INSERTION;  Surgeon: Algernon Huxley, MD;  Location: [facility];  Service: Cardiovascular;  Laterality: N/A;  . VIDEO BRONCHOSCOPY WITH ENDOBRONCHIAL NAVIGATION N/A  07/01/2020   Procedure: VIDEO BRONCHOSCOPY WITH ENDOBRONCHIAL NAVIGATION;  Surgeon: [name], MD;  Location: [facility];  Service: Thoracic;  Laterality: N/A;  . VIDEO BRONCHOSCOPY WITH ENDOBRONCHIAL ULTRASOUND N/A 07/01/2020   Procedure: VIDEO BRONCHOSCOPY WITH ENDOBRONCHIAL ULTRASOUND;  Surgeon: [name], MD;  Location: [facility];  Service: Thoracic;  Laterality: N/A;    Social History   Socioeconomic History  . Marital status: Divorced    Spouse name: Not on file  . Number of children: Not on file  . Years of education: Not on file  . Highest education level: Not on file  Occupational History  . Not on file  Tobacco Use  . Smoking status: Former Smoker    Packs/day: 2.00    Years: 32.00    Pack years: 64.00    Types: Cigarettes    Quit date: 06/21/2020    Years since quitting: 0.5  . Smokeless tobacco: Never Used  Vaping Use  . Vaping Use: Never used  Substance and Sexual Activity  . Alcohol use: No    Alcohol/week: 0.0 standard drinks  . Drug use: Not Currently    Types: Marijuana  . Sexual activity: Not on file  Other Topics Concern  . Not on file  Social History Narrative   Lives at home with girlfriend   Social Determinants of Health   Financial Resource Strain: Not on file  Food Insecurity: Not on file  Transportation Needs: Not  on file  Physical Activity: Not on file  Stress: Not on file  Social Connections: Not on file  Intimate Partner Violence: Not on file    Family History  Problem Relation Age of Onset  . Diabetes Sister   . Diabetes Brother      Current Outpatient Medications:  .  dexamethasone (DECADRON) 4 MG tablet, Take 1 tablet (4 mg total) by mouth 3 (three) times daily with meals., Disp: 90 tablet, Rfl: 0 .  vitamin B-12 (CYANOCOBALAMIN) 1000 MCG tablet, Take 1,000 mcg by mouth daily., Disp: , Rfl:  .  dronabinol (MARINOL) 5 MG capsule, Take 1 capsule (5 mg total) by mouth 2 (two) times daily before a meal. (Patient not taking: No  sig reported), Disp: 60 capsule, Rfl: 0 .  levETIRAcetam (KEPPRA) 500 MG tablet, Take 1 tablet (500 mg total) by mouth 2 (two) times daily. (Patient not taking: Reported on 01/09/2021), Disp: 60 tablet, Rfl: 0 .  metoprolol succinate (TOPROL-XL) 25 MG 24 hr tablet, Take 0.5 tablets (12.5 mg total) by mouth daily. (Patient not taking: No sig reported), Disp: 15 tablet, Rfl: 5 .  potassium chloride SA (KLOR-CON) 20 MEQ tablet, Take 1 tablet (20 mEq total) by mouth 2 (two) times daily. (Patient not taking: Reported on 01/09/2021), Disp: 28 tablet, Rfl: 0 .  vancomycin (VANCOCIN) 50 mg/mL oral solution, Take 2.5 mLs (125 mg total) by mouth 4 (four) times daily. (Patient not taking: No sig reported), Disp: , Rfl:  No current facility-administered medications for this visit.  Facility-Administered Medications Ordered in Other Visits:  .  heparin lock flush 100 unit/mL, 500 Units, Intravenous, Once, Brahmanday, Govinda R, MD .  heparin lock flush 100 unit/mL, 500 Units, Intravenous, Once, [name], MD .  sodium chloride flush (NS) 0.9 % injection 10 mL, 10 mL, Intravenous, Once, Brahmanday, Govinda R, MD .  sodium chloride flush (NS) 0.9 % injection 10 mL, 10 mL, Intravenous, PRN, [name], MD, 10 mL at 01/09/21 1000  Physical exam: There were no vitals filed for this visit. Physical Exam Constitutional:      Comments: Appears older than stated age. Unaccompanied  HENT:     Mouth/Throat:     Mouth: Mucous membranes are dry.     Pharynx: Oropharynx is clear.  Eyes:     General: No scleral icterus.    Extraocular Movements: Extraocular movements intact.     Conjunctiva/sclera: Conjunctivae normal.     Pupils: Pupils are equal, round, and reactive to light.  Cardiovascular:     Rate and Rhythm: Normal rate and regular rhythm.  Pulmonary:     Effort: Pulmonary effort is normal. No respiratory distress.  Musculoskeletal:        General: No signs of injury.  Skin:    General: Skin is  warm and dry.  Neurological:     Mental Status: He is alert and oriented to person, place, and time.     GCS: GCS eye subscore is 4. GCS verbal subscore is 5. GCS motor subscore is 6.     Motor: No weakness.  Psychiatric:        Attention and Perception: Attention normal.        Mood and Affect: Mood normal.        Speech: Speech normal.        Behavior: Behavior is slowed (only as compared to patient's baseline; minor).        Cognition and Memory: Cognition normal.  CMP Latest Ref Rng & Units 01/08/2021  Glucose 70 - 99 mg/dL 112(H)  BUN 6 - 20 mg/dL <5(L)  Creatinine 0.61 - 1.24 mg/dL 0.54(L)  Sodium 135 - 145 mmol/L 139  Potassium 3.5 - 5.1 mmol/L 3.5  Chloride 98 - 111 mmol/L 104  CO2 22 - 32 mmol/L 26  Calcium 8.9 - 10.3 mg/dL 8.2(L)  Total Protein 6.5 - 8.1 g/dL -  Total Bilirubin 0.3 - 1.2 mg/dL -  Alkaline Phos 38 - 126 U/L -  AST 15 - 41 U/L -  ALT 0 - 44 U/L -   CBC Latest Ref Rng & Units 01/07/2021  WBC 4.0 - 10.5 K/uL 7.7  Hemoglobin 13.0 - 17.0 g/dL 11.2(L)  Hematocrit 39.0 - 52.0 % 33.5(L)  Platelets 150 - 400 K/uL 259    No images are attached to the encounter.  MR BRAIN W WO CONTRAST  Result Date: 01/06/2021 CLINICAL DATA:  Right-sided weakness, history of metastatic lung cancer with history of resection and whole brain radiation EXAM: MRI HEAD WITHOUT AND WITH CONTRAST TECHNIQUE: Multiplanar, multiecho pulse sequences of the brain and surrounding structures were obtained without and with intravenous contrast. CONTRAST:  69mL GADAVIST GADOBUTROL 1 MMOL/ML IV SOLN COMPARISON:  11/08/2020 FINDINGS: Brain: In the parasagittal left parietal lobe, there is a heterogeneously enhancing, partially necrotic lesion measuring 1.6 x 1.2 cm at the location of prior 0.6 cm lesion. There is extensive surrounding edema. Slightly increased enhancement in the parasagittal left frontal lobe at the inferior margin of the resection cavity. The punctate foci of enhancement in the  left frontoparietal region on the prior study are not seen although there is greater motion artifact on this study. 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 There is no acute infarction. No hydrocephalus. No significant mass effect. Vascular: Major vessel flow voids at the skull base are preserved. Skull and upper cervical spine: Left vertex craniotomy. Normal marrow signal is preserved. Sinuses/Orbits: Paranasal sinuses are aerated. Orbits are unremarkable. Other: Sella is unremarkable.  Mastoid air cells are clear. IMPRESSION: Increase in size and heterogeneity of parasagittal left parietal lesion with new extensive surrounding edema. Likely reflects progression of metastatic disease. However, given history of radiation, disproportionate edema, and appearance of enhancement, treatment effect is a consideration. Punctate foci of enhancement in the left frontoparietal region are no longer seen. Electronically Signed   By: Macy Mis M.D.   On: 01/06/2021 17:32   CT HEAD CODE STROKE WO CONTRAST  Result Date: 01/06/2021 CLINICAL DATA:  Code stroke.  Neuro deficit, acute stroke suspected. EXAM: CT HEAD WITHOUT CONTRAST TECHNIQUE: Contiguous axial images were obtained from the base of the skull through the vertex without intravenous contrast. COMPARISON:  MRI November 08, 2020.  CT head June 21, 2020. FINDINGS: Brain: There is new exuberant vasogenic edema in the high left parietal lobe with suspected mass in this region (see series 3, image 24). Local sulcal effacement without midline shift. Small area of encephalomalacia in the high right parafalcine region, compatible with sequela of prior metastatic lesion resection. Remote right frontal infarct with encephalomalacia. No evidence of acute large vascular territory infarct. No acute hemorrhage. No hydrocephalus. Additional patchy white matter hypoattenuation, most likely related to chronic microvascular ischemic disease Vascular: Calcific  atherosclerosis. Skull: No acute fracture. Sinuses/Orbits: Mild paranasal sinus mucosal thickening with polyp versus retention cyst in the right maxillary sinus. Other: Small left mastoid effusion. IMPRESSION: New vasogenic edema in the high left parietal lobe with suspected mass in  this region, concerning for progression of the patient's known metastatic disease. Local sulcal effacement without midline shift. Recommend MRI with contrast to further evaluate. Findings discussed with Dr. Milas Gain via telephone at 2:46 p.m. Electronically Signed   By: Margaretha Sheffield MD   On: 01/06/2021 14:49    Assessment and plan- Patient is a 60 y.o. 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 Discharged 1/10. Hasn't picked up keppra yet. Encouraged rationale for and encouraged compliance to patient and girlfriend who is primary caregiver. Will refer to neurology to follow patient; patient declines neuro-oncology due to driving distance and requests local neurologist. Will refer to [facility]- Dr. Melrose Nakayama and Dr. Manuella Ghazi. Will refer back to [name], radiation-oncology for re-evaluation. Patient advised not to drive given recent seizures and he is agreeable.   Patient questions prognosis given new brain disease. Encouraged him to follow up with [name]. He is followed by outpatient palliative care who I will update.   Follow up with [name] as scheduled. RTC if symptoms worsen in the interim. ER precautions provided. Encouraged medication compliance.    Visit Diagnosis 1. NSCLC metastatic to brain [facility])   2. Seizure ([place])     Patient expressed understanding and was in agreement with this plan. He also understands that He can call clinic at any time with any questions, concerns, or complaints.   Thank  you for allowing me to participate in the care of this very pleasant patient.   Beckey Rutter, DNP, AGNP-C [facility]  CC: Billey Chang, NP

## 2021-01-09 NOTE — Progress Notes (Signed)
Nutrition Follow-up:  Patient with lung cancer, stage IV.  Patient has completed chemotherapy and radiation therapy, currently on immunotherapy.  Patient admitted over the weekend with seizure, new brain mets found.  Planning meeting with radiation oncology.  PEG planned for 1/13.   Met with patient in clinic during fluids.     Medications: reviewed  Labs: phosphorus 3.9 (1/6). Mag 2.0 (1/9) K 3.5 (1/9) Anthropometrics:   Weight 175 lb on 1/7 174 lb on 1/6 210 lb 11/1 213 lb on 9/30   Estimated Energy Needs  Kcals: 2400-2800 Protein: 120-140 g Fluid: > 2 L  NUTRITION DIAGNOSIS: Inadequate oral intake continues as planning PEG placement   MALNUTRITION DIAGNOSIS: Patient meets criteria for severe malnutrition   INTERVENTION:  Recommend starting isosource 1.5 at 1/2 carton 4 times per day. Patient to increase tube feeding by 1/2 carton daily due to refeeding risk.  Goal rate is 7 cartons per day.  Hand written tube feeding regimen provided to patient today along with tube feeding instructions.  Ask patient if he wanted RD to call girlfriend to discuss and he said he would show her the form and not to call her.   Patient will need refeeding labs checked at least 2 times per week following PEG placement. Message sent to MD and RN RD will send information to [employer] for enteral supplies. [employer] will provide enteral supplies and teaching following PEG placement Contact information provided    MONITORING, EVALUATION, GOAL: weight trends, intake, tube feeding   NEXT VISIT: Jan 20 phone call   B. , RD, LDN Registered Dietitian [phone] (mobile)     

## 2021-01-09 NOTE — Discharge Summary (Addendum)
Name: Tyler Gallagher MRN: [id number] DOB: 06/17/1961 60 y.o. PCP: Juluis Pitch, MD  Date of Admission: 01/06/2021  2:23 PM Date of Discharge: 01/08/2021 Attending Physician: No att. providers found  Discharge Diagnosis: 1. Seizure 2. Brain metastasis  3. Electrolyte disturbances  4. Malnutrition  Discharge Medications: Allergies as of 01/08/2021       Reactions   Penicillins Other (See Comments)   "everything felt distant"        Medication List     TAKE these medications    dexamethasone 4 MG tablet Commonly known as: DECADRON Take 1 tablet (4 mg total) by mouth 3 (three) times daily with meals.   dronabinol 5 MG capsule Commonly known as: MARINOL Take 1 capsule (5 mg total) by mouth 2 (two) times daily before a meal.   levETIRAcetam 500 MG tablet Commonly known as: KEPPRA Take 1 tablet (500 mg total) by mouth 2 (two) times daily.   metoprolol succinate 25 MG 24 hr tablet Commonly known as: TOPROL-XL Take 0.5 tablets (12.5 mg total) by mouth daily.   potassium chloride SA 20 MEQ tablet Commonly known as: KLOR-CON Take 1 tablet (20 mEq total) by mouth 2 (two) times daily. What changed: when to take this   vancomycin 50 mg/mL  oral solution Commonly known as: VANCOCIN Take 2.5 mLs (125 mg total) by mouth 4 (four) times daily.   vitamin B-12 1000 MCG tablet Commonly known as: CYANOCOBALAMIN Take 1,000 mcg by mouth daily.        Disposition and follow-up:   Mr.Khan K Lizotte was discharged from [facility] in Stable condition.  At the hospital follow up visit please address:  1.  Seizures- 2/2 to metastatic lung adenocarcinoma. Discharged on Keppra and decadron with close outpatient follow up. Follows with oncology and palliative care.   2.  Labs / imaging needed at time of follow-up: none  3.  Pending labs/ test needing follow-up: none  Follow-up Appointments:  Follow-up Information     [facility] Follow up.    Specialty: [place] Why: The home health agency will contact you for the first home visit Contact information: [address] [place] [address] [phone]                 Hospital Course by problem list: 1. Seizure-patient presented with acute onset seizure in the setting of progressive brain metastatic disease.  MRI brain showed significant vasogenic edema with local sulcal effacement, no midline shift in the setting of progressive brain metastatic disease versus reactive changes to brain radiation.  Seizure activity aborted with Ativan and loading dose of Keppra.  Decadron was also started in the setting of significant edema.  No more seizure activity was observed during hospital admission.  PT OT recommended CIR however patient preferred to go home.  Discharged with home health PT.  Also continued Keppra 500 mg twice daily and dexamethasone 4 mg 3 times daily.  He is to follow-up with outpatient neuro onc to determine length of dexamethasone.  2. Brain metastasis -as above.  3. Electrolyte disturbances -potassium and magnesium were low and replenished during hospital stay.  4. Malnutrition-in the setting of decreased appetite secondary to recent chemo and radiation.  Patient is to have a PEG tube placed next week per oncology to begin supplementing diet.  Given multivitamin with minerals, vitamin D and vitamin C during hospital stay.   Discharge Vitals:   BP 115/81 (BP Location: Left Arm)  Pulse 91   Temp (!) 97.5 F (36.4 C) (Oral)   Resp 18   Ht 6\' 3"  (1.905 m)   Wt 79.4 kg   SpO2 96%   BMI 21.88 kg/m   Pertinent Labs, Studies, and Procedures:  BMP Latest Ref Rng & Units 01/08/2021 01/07/2021 01/06/2021  Glucose 70 - 99 mg/dL 112(H) 138(H) 219(H)  BUN 6 - 20 mg/dL <5(L) <5(L) <3(L)  Creatinine 0.61 - 1.24 mg/dL 0.54(L) 0.53(L) 0.50(L)  Sodium 135 - 145 mmol/L 139 139 136  Potassium 3.5 - 5.1 mmol/L 3.5 3.2(L) 2.4(LL)  Chloride 98 - 111 mmol/L  104 101 96(L)  CO2 22 - 32 mmol/L 26 26 -  Calcium 8.9 - 10.3 mg/dL 8.2(L) 8.4(L) -   CBC Latest Ref Rng & Units 01/07/2021 01/06/2021 01/06/2021  WBC 4.0 - 10.5 K/uL 7.7 - 14.4(H)  Hemoglobin 13.0 - 17.0 g/dL 11.2(L) 14.3 13.0  Hematocrit 39.0 - 52.0 % 33.5(L) 42.0 40.3  Platelets 150 - 400 K/uL 259 - 363   MR BRAIN W WO CONTRAST  Result Date: 01/06/2021 CLINICAL DATA:  Right-sided weakness, history of metastatic lung cancer with history of resection and whole brain radiation EXAM: MRI HEAD WITHOUT AND WITH CONTRAST TECHNIQUE: Multiplanar, multiecho pulse sequences of the brain and surrounding structures were obtained without and with intravenous contrast. CONTRAST:  85mL GADAVIST GADOBUTROL 1 MMOL/ML IV SOLN COMPARISON:  11/08/2020 FINDINGS: Brain: In the parasagittal left parietal lobe, there is a heterogeneously enhancing, partially necrotic lesion measuring 1.6 x 1.2 cm at the location of prior 0.6 cm lesion. There is extensive surrounding edema. Slightly increased enhancement in the parasagittal left frontal lobe at the inferior margin of the resection cavity. The punctate foci of enhancement in the left frontoparietal region on the prior study are not seen although there is greater motion artifact on this study. 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 There is no acute infarction. No hydrocephalus. No significant mass effect. Vascular: Major vessel flow voids at the skull base are preserved. Skull and upper cervical spine: Left vertex craniotomy. Normal marrow signal is preserved. Sinuses/Orbits: Paranasal sinuses are aerated. Orbits are unremarkable. Other: Sella is unremarkable.  Mastoid air cells are clear. IMPRESSION: Increase in size and heterogeneity of parasagittal left parietal lesion with new extensive surrounding edema. Likely reflects progression of metastatic disease. However, given history of radiation, disproportionate edema, and appearance of enhancement, treatment  effect is a consideration. Punctate foci of enhancement in the left frontoparietal region are no longer seen. Electronically Signed   By: Macy Mis M.D.   On: 01/06/2021 17:32   CT HEAD CODE STROKE WO CONTRAST  Result Date: 01/06/2021 CLINICAL DATA:  Code stroke.  Neuro deficit, acute stroke suspected. EXAM: CT HEAD WITHOUT CONTRAST TECHNIQUE: Contiguous axial images were obtained from the base of the skull through the vertex without intravenous contrast. COMPARISON:  MRI November 08, 2020.  CT head June 21, 2020. FINDINGS: Brain: There is new exuberant vasogenic edema in the high left parietal lobe with suspected mass in this region (see series 3, image 24). Local sulcal effacement without midline shift. Small area of encephalomalacia in the high right parafalcine region, compatible with sequela of prior metastatic lesion resection. Remote right frontal infarct with encephalomalacia. No evidence of acute large vascular territory infarct. No acute hemorrhage. No hydrocephalus. Additional patchy white matter hypoattenuation, most likely related to chronic microvascular ischemic disease Vascular: Calcific atherosclerosis. Skull: No acute fracture. Sinuses/Orbits: Mild paranasal sinus mucosal thickening with polyp versus retention  cyst in the right maxillary sinus. Other: Small left mastoid effusion. IMPRESSION: New vasogenic edema in the high left parietal lobe with suspected mass in this region, concerning for progression of the patient's known metastatic disease. Local sulcal effacement without midline shift. Recommend MRI with contrast to further evaluate. Findings discussed with Dr. Milas Gain via telephone at 2:46 p.m. Electronically Signed   By: Margaretha Sheffield MD   On: 01/06/2021 14:49     Discharge Instructions: Discharge Instructions     Diet - low sodium heart healthy   Complete by: As directed    Discharge instructions   Complete by: As directed    Mr. Lipford,  You were hospitalized due to  seizures.  You were treated with Keppra.  This is likely related to your brain metastatic disease.  I want you to follow-up with your neuro oncologist within 1 week.  Please note the following changes to your medications:  START Keppra 500 mg twice a day START Decadron 4 mg 3 times daily  I have also ordered home health physical therapy to work with you at home.  I wish you the best of luck in your treatment and recovery.  Thank you for allowing [place] to be a part of your care!   Increase activity slowly   Complete by: As directed        Signed: Analis Distler N, DO 01/09/2021, 8:02 AM

## 2021-01-09 NOTE — Progress Notes (Signed)
Pt had lab ( met b) in ED yest, no potassium needed today. Pt to receive 1 liter of NS. He will be seen by Ander Purpura NP as he had a seizure yest with new brain met identified. Pt states he remembers thrashing around yest. Girlfriend called 911. Today he feels "groggy like " a little bit. Denies any headache. Feels his usual weakness. Pt also had a visit in clinic with the Dietician this morning. Discharge to home with his girlfriend.

## 2021-01-10 ENCOUNTER — Encounter
Admission: RE | Admit: 2021-01-10 | Discharge: 2021-01-10 | Disposition: A | Discharge status: Home / Self Care | Payer: Medicare HMO | Source: Ambulatory Visit | Attending: Surgery | Admitting: Surgery

## 2021-01-10 ENCOUNTER — Other Ambulatory Visit: Payer: Medicare HMO

## 2021-01-10 HISTORY — DX: Depression, unspecified: F32.A

## 2021-01-10 NOTE — Patient Instructions (Signed)
Your procedure is scheduled on: 01/12/21 Report to [place]. To find out your arrival time please call [phone] between 1PM - 3PM on 01/11/21.  Remember: Instructions that are not followed completely may result in serious medical risk, up to and including death, or upon the discretion of your surgeon and anesthesiologist your surgery may need to be rescheduled.     _X__ 1. Do not eat food after midnight the night before your procedure.                 No gum chewing or hard candies. You may drink clear liquids up to 2 hours                 before you are scheduled to arrive for your surgery- DO not drink clear                 liquids within 2 hours of the start of your surgery.                 Clear Liquids include:  water, apple juice without pulp, clear carbohydrate                 drink such as Clearfast or Gatorade, Black Coffee or Tea (Do not add                 anything to coffee or tea). Diabetics water only  __X__2.  On the morning of surgery brush your teeth with toothpaste and water, you                 may rinse your mouth with mouthwash if you wish.  Do not swallow any              toothpaste of mouthwash.     _X__ 3.  No Alcohol for 24 hours before or after surgery.   _X__ 4.  Do Not Smoke or use e-cigarettes For 24 Hours Prior to Your Surgery.                 Do not use any chewable tobacco products for at least 6 hours prior to                 surgery.  ____  5.  Bring all medications with you on the day of surgery if instructed.   __X__  6.  Notify your doctor if there is any change in your medical condition      (cold, fever, infections).     Do not wear jewelry, make-up, hairpins, clips or nail polish. Do not wear lotions, powders, or perfumes.  Do not shave 48 hours prior to surgery. Men may shave face and neck. Do not bring valuables to the hospital.    [facility] is not responsible for any belongings or  valuables.  Contacts, dentures/partials or body piercings may not be worn into surgery. Bring a case for your contacts, glasses or hearing aids, a denture cup will be supplied. Leave your suitcase in the car. After surgery it may be brought to your room. For patients admitted to the hospital, discharge time is determined by your treatment team.   Patients discharged the day of surgery will not be allowed to drive home.   Please read over the following fact sheets that you were given:   SAGE wipes  __X__ Take these medicines the morning of surgery with A SIP OF WATER:  1. dexamethasone (DECADRON) 4 MG tablet  2. metoprolol succinate (TOPROL-XL) 25 MG 24 hr tablet  3. levETIRAcetam (KEPPRA) 500 MG tablet  4.  5.  6.  ____ Fleet Enema (as directed)   __X__ Use CHG Soap/SAGE wipes as directed  ____ Use inhalers on the day of surgery  __X__ Stop metformin/Janumet/Farxiga 2 days prior to surgery    ____ Take 1/2 of usual insulin dose the night before surgery. No insulin the morning          of surgery.   ____ Stop Blood Thinners Coumadin/Plavix/Xarelto/Pleta/Pradaxa/Eliquis/Effient/Aspirin  on   Or contact your Surgeon, Cardiologist or Medical Doctor regarding  ability to stop your blood thinners  __X__ Stop Anti-inflammatories 7 days before surgery such as Advil, Ibuprofen, Motrin,  BC or Goodies Powder, Naprosyn, Naproxen, Aleve, Aspirin    __X__ Stop all herbal supplements, fish oil or vitamin E until after surgery.    ____ Bring C-Pap to the hospital.

## 2021-01-11 ENCOUNTER — Other Ambulatory Visit: Payer: Self-pay

## 2021-01-11 ENCOUNTER — Inpatient Hospital Stay
Admission: EM | Admit: 2021-01-11 | Discharge: 2021-01-17 | DRG: 100 | Disposition: A | Discharge status: Home / Self Care | Payer: Medicare HMO | Attending: Internal Medicine | Admitting: Internal Medicine

## 2021-01-11 ENCOUNTER — Emergency Department: Payer: Medicare HMO

## 2021-01-11 ENCOUNTER — Encounter: Payer: Self-pay | Admitting: Anesthesiology

## 2021-01-11 ENCOUNTER — Inpatient Hospital Stay: Admission: RE | Admit: 2021-01-11 | Payer: Medicare HMO | Source: Ambulatory Visit

## 2021-01-11 ENCOUNTER — Encounter: Payer: Self-pay | Admitting: Emergency Medicine

## 2021-01-11 DIAGNOSIS — E440 Moderate protein-calorie malnutrition: Secondary | ICD-10-CM

## 2021-01-11 DIAGNOSIS — C787 Secondary malignant neoplasm of liver and intrahepatic bile duct: Secondary | ICD-10-CM | POA: Diagnosis present

## 2021-01-11 DIAGNOSIS — E43 Unspecified severe protein-calorie malnutrition: Secondary | ICD-10-CM | POA: Diagnosis present

## 2021-01-11 DIAGNOSIS — Z79899 Other long term (current) drug therapy: Secondary | ICD-10-CM

## 2021-01-11 DIAGNOSIS — R531 Weakness: Secondary | ICD-10-CM

## 2021-01-11 DIAGNOSIS — D72829 Elevated white blood cell count, unspecified: Secondary | ICD-10-CM | POA: Diagnosis not present

## 2021-01-11 DIAGNOSIS — G936 Cerebral edema: Secondary | ICD-10-CM | POA: Diagnosis present

## 2021-01-11 DIAGNOSIS — I5022 Chronic systolic (congestive) heart failure: Secondary | ICD-10-CM | POA: Diagnosis not present

## 2021-01-11 DIAGNOSIS — F32A Depression, unspecified: Secondary | ICD-10-CM | POA: Diagnosis present

## 2021-01-11 DIAGNOSIS — Z880 Allergy status to penicillin: Secondary | ICD-10-CM

## 2021-01-11 DIAGNOSIS — G40909 Epilepsy, unspecified, not intractable, without status epilepticus: Principal | ICD-10-CM | POA: Diagnosis present

## 2021-01-11 DIAGNOSIS — E785 Hyperlipidemia, unspecified: Secondary | ICD-10-CM | POA: Diagnosis not present

## 2021-01-11 DIAGNOSIS — Z9221 Personal history of antineoplastic chemotherapy: Secondary | ICD-10-CM

## 2021-01-11 DIAGNOSIS — E860 Dehydration: Secondary | ICD-10-CM | POA: Diagnosis not present

## 2021-01-11 DIAGNOSIS — I110 Hypertensive heart disease with heart failure: Secondary | ICD-10-CM | POA: Diagnosis present

## 2021-01-11 DIAGNOSIS — C3431 Malignant neoplasm of lower lobe, right bronchus or lung: Secondary | ICD-10-CM | POA: Diagnosis present

## 2021-01-11 DIAGNOSIS — C7989 Secondary malignant neoplasm of other specified sites: Secondary | ICD-10-CM | POA: Diagnosis present

## 2021-01-11 DIAGNOSIS — E46 Unspecified protein-calorie malnutrition: Secondary | ICD-10-CM | POA: Diagnosis not present

## 2021-01-11 DIAGNOSIS — Z89612 Acquired absence of left leg above knee: Secondary | ICD-10-CM

## 2021-01-11 DIAGNOSIS — R569 Unspecified convulsions: Secondary | ICD-10-CM | POA: Diagnosis not present

## 2021-01-11 DIAGNOSIS — J439 Emphysema, unspecified: Secondary | ICD-10-CM | POA: Diagnosis not present

## 2021-01-11 DIAGNOSIS — R4182 Altered mental status, unspecified: Secondary | ICD-10-CM | POA: Diagnosis present

## 2021-01-11 DIAGNOSIS — Z6820 Body mass index (BMI) 20.0-20.9, adult: Secondary | ICD-10-CM

## 2021-01-11 DIAGNOSIS — Z9114 Patient's other noncompliance with medication regimen: Secondary | ICD-10-CM

## 2021-01-11 DIAGNOSIS — I10 Essential (primary) hypertension: Secondary | ICD-10-CM | POA: Diagnosis present

## 2021-01-11 DIAGNOSIS — Z20822 Contact with and (suspected) exposure to covid-19: Secondary | ICD-10-CM | POA: Diagnosis present

## 2021-01-11 DIAGNOSIS — R000 Tachycardia, unspecified: Secondary | ICD-10-CM | POA: Diagnosis not present

## 2021-01-11 DIAGNOSIS — I429 Cardiomyopathy, unspecified: Secondary | ICD-10-CM | POA: Diagnosis present

## 2021-01-11 DIAGNOSIS — Z8673 Personal history of transient ischemic attack (TIA), and cerebral infarction without residual deficits: Secondary | ICD-10-CM

## 2021-01-11 DIAGNOSIS — Z431 Encounter for attention to gastrostomy: Secondary | ICD-10-CM | POA: Diagnosis not present

## 2021-01-11 DIAGNOSIS — Z7984 Long term (current) use of oral hypoglycemic drugs: Secondary | ICD-10-CM

## 2021-01-11 DIAGNOSIS — Z923 Personal history of irradiation: Secondary | ICD-10-CM | POA: Diagnosis not present

## 2021-01-11 DIAGNOSIS — E876 Hypokalemia: Secondary | ICD-10-CM | POA: Diagnosis present

## 2021-01-11 DIAGNOSIS — C3490 Malignant neoplasm of unspecified part of unspecified bronchus or lung: Secondary | ICD-10-CM | POA: Diagnosis not present

## 2021-01-11 DIAGNOSIS — C7931 Secondary malignant neoplasm of brain: Secondary | ICD-10-CM | POA: Diagnosis present

## 2021-01-11 DIAGNOSIS — E119 Type 2 diabetes mellitus without complications: Secondary | ICD-10-CM | POA: Diagnosis present

## 2021-01-11 DIAGNOSIS — R8271 Bacteriuria: Secondary | ICD-10-CM | POA: Diagnosis present

## 2021-01-11 DIAGNOSIS — Z87891 Personal history of nicotine dependence: Secondary | ICD-10-CM

## 2021-01-11 DIAGNOSIS — Z833 Family history of diabetes mellitus: Secondary | ICD-10-CM

## 2021-01-11 HISTORY — DX: Unspecified convulsions: R56.9

## 2021-01-11 LAB — URINALYSIS, ROUTINE W REFLEX MICROSCOPIC
Bilirubin Urine: NEGATIVE
Glucose, UA: NEGATIVE mg/dL
Hgb urine dipstick: NEGATIVE
Ketones, ur: 5 mg/dL — AB
Leukocytes,Ua: NEGATIVE
Nitrite: NEGATIVE
Protein, ur: NEGATIVE mg/dL
Specific Gravity, Urine: 1.002 — ABNORMAL LOW (ref 1.005–1.030)
pH: 6 (ref 5.0–8.0)

## 2021-01-11 LAB — RESP PANEL BY RT-PCR (FLU A&B, COVID) ARPGX2
Influenza A by PCR: NEGATIVE
Influenza B by PCR: NEGATIVE
SARS Coronavirus 2 by RT PCR: NEGATIVE

## 2021-01-11 LAB — CBC WITH DIFFERENTIAL/PLATELET
Abs Immature Granulocytes: 0.09 10*3/uL — ABNORMAL HIGH (ref 0.00–0.07)
Basophils Absolute: 0 10*3/uL (ref 0.0–0.1)
Basophils Relative: 0 %
Eosinophils Absolute: 0.5 10*3/uL (ref 0.0–0.5)
Eosinophils Relative: 3 %
HCT: 37.2 % — ABNORMAL LOW (ref 39.0–52.0)
Hemoglobin: 12.2 g/dL — ABNORMAL LOW (ref 13.0–17.0)
Immature Granulocytes: 1 %
Lymphocytes Relative: 5 %
Lymphs Abs: 0.7 10*3/uL (ref 0.7–4.0)
MCH: 29.8 pg (ref 26.0–34.0)
MCHC: 32.8 g/dL (ref 30.0–36.0)
MCV: 90.7 fL (ref 80.0–100.0)
Monocytes Absolute: 1 10*3/uL (ref 0.1–1.0)
Monocytes Relative: 7 %
Neutro Abs: 13.2 10*3/uL — ABNORMAL HIGH (ref 1.7–7.7)
Neutrophils Relative %: 84 %
Platelets: 285 10*3/uL (ref 150–400)
RBC: 4.1 MIL/uL — ABNORMAL LOW (ref 4.22–5.81)
RDW: 14.8 % (ref 11.5–15.5)
WBC: 15.6 10*3/uL — ABNORMAL HIGH (ref 4.0–10.5)
nRBC: 0 % (ref 0.0–0.2)

## 2021-01-11 LAB — COMPREHENSIVE METABOLIC PANEL
ALT: 23 U/L (ref 0–44)
AST: 23 U/L (ref 15–41)
Albumin: 2.6 g/dL — ABNORMAL LOW (ref 3.5–5.0)
Alkaline Phosphatase: 147 U/L — ABNORMAL HIGH (ref 38–126)
Anion gap: 14 (ref 5–15)
BUN: 8 mg/dL (ref 6–20)
CO2: 26 mmol/L (ref 22–32)
Calcium: 8.3 mg/dL — ABNORMAL LOW (ref 8.9–10.3)
Chloride: 96 mmol/L — ABNORMAL LOW (ref 98–111)
Creatinine, Ser: 0.45 mg/dL — ABNORMAL LOW (ref 0.61–1.24)
GFR, Estimated: >60 mL/min (ref >60)
Glucose, Bld: 117 mg/dL — ABNORMAL HIGH (ref 70–99)
Potassium: 2.7 mmol/L — CL (ref 3.5–5.1)
Sodium: 136 mmol/L (ref 135–145)
Total Bilirubin: 1.1 mg/dL (ref 0.3–1.2)
Total Protein: 6.4 g/dL — ABNORMAL LOW (ref 6.5–8.1)

## 2021-01-11 LAB — GLUCOSE, CAPILLARY
Glucose-Capillary: 89 mg/dL (ref 70–99)
Glucose-Capillary: 98 mg/dL (ref 70–99)

## 2021-01-11 LAB — MAGNESIUM: Magnesium: 1.9 mg/dL (ref 1.7–2.4)

## 2021-01-11 LAB — TROPONIN I (HIGH SENSITIVITY): Troponin I (High Sensitivity): 4 ng/L (ref <18)

## 2021-01-11 LAB — PROTIME-INR
INR: 1.2 (ref 0.8–1.2)
Prothrombin Time: 14.9 seconds (ref 11.4–15.2)

## 2021-01-11 LAB — BASIC METABOLIC PANEL
Anion gap: 11 (ref 5–15)
BUN: 8 mg/dL (ref 6–20)
CO2: 25 mmol/L (ref 22–32)
Calcium: 8.5 mg/dL — ABNORMAL LOW (ref 8.9–10.3)
Chloride: 101 mmol/L (ref 98–111)
Creatinine, Ser: 0.5 mg/dL — ABNORMAL LOW (ref 0.61–1.24)
GFR, Estimated: >60 mL/min (ref >60)
Glucose, Bld: 108 mg/dL — ABNORMAL HIGH (ref 70–99)
Potassium: 3.7 mmol/L (ref 3.5–5.1)
Sodium: 137 mmol/L (ref 135–145)

## 2021-01-11 LAB — LACTIC ACID, PLASMA: Lactic Acid, Venous: 1.5 mmol/L (ref 0.5–1.9)

## 2021-01-11 LAB — BRAIN NATRIURETIC PEPTIDE: B Natriuretic Peptide: 48.4 pg/mL (ref 0.0–100.0)

## 2021-01-11 LAB — PROCALCITONIN: Procalcitonin: <0.1 ng/mL

## 2021-01-11 LAB — APTT: aPTT: 28 seconds (ref 24–36)

## 2021-01-11 MED ORDER — POTASSIUM CHLORIDE 10 MEQ/100ML IV SOLN
10.0000 meq | INTRAVENOUS | Status: AC
  Administered 2021-01-11 (×2): 10 meq via INTRAVENOUS
  Filled 2021-01-11 (×2): qty 100

## 2021-01-11 MED ORDER — SODIUM CHLORIDE 0.9 % IV SOLN
2.0000 g | Freq: Once | INTRAVENOUS | Status: AC
  Administered 2021-01-11: 2 g via INTRAVENOUS
  Filled 2021-01-11: qty 2

## 2021-01-11 MED ORDER — SODIUM CHLORIDE 0.9 % IV BOLUS (SEPSIS)
500.0000 mL | Freq: Once | INTRAVENOUS | Status: AC
  Administered 2021-01-11: 500 mL via INTRAVENOUS

## 2021-01-11 MED ORDER — POTASSIUM CHLORIDE CRYS ER 20 MEQ PO TBCR
80.0000 meq | EXTENDED_RELEASE_TABLET | Freq: Once | ORAL | Status: AC
  Administered 2021-01-11: 80 meq via ORAL
  Filled 2021-01-11: qty 4

## 2021-01-11 MED ORDER — LEVETIRACETAM 500 MG PO TABS: 500.0000 mg | ORAL_TABLET | Freq: Two times a day (BID) | ORAL | 1 refills | Status: DC

## 2021-01-11 MED ORDER — LACTATED RINGERS IV BOLUS
500.0000 mL | Freq: Once | INTRAVENOUS | Status: AC
  Administered 2021-01-11: 500 mL via INTRAVENOUS

## 2021-01-11 MED ORDER — INSULIN ASPART 100 UNIT/ML ~~LOC~~ SOLN
0.0000 [IU] | Freq: Every day | SUBCUTANEOUS | Status: DC
  Administered 2021-01-15: 2 [IU] via SUBCUTANEOUS
  Filled 2021-01-11: qty 1

## 2021-01-11 MED ORDER — CHLORHEXIDINE GLUCONATE CLOTH 2 % EX PADS
6.0000 | MEDICATED_PAD | Freq: Every day | CUTANEOUS | Status: DC
  Administered 2021-01-13 – 2021-01-16 (×4): 6 via TOPICAL

## 2021-01-11 MED ORDER — HYDRALAZINE HCL 20 MG/ML IJ SOLN: 5.0000 mg | INTRAMUSCULAR | Status: DC | PRN

## 2021-01-11 MED ORDER — LEVETIRACETAM IN NACL 1000 MG/100ML IV SOLN
1000.0000 mg | Freq: Once | INTRAVENOUS | Status: AC
  Administered 2021-01-11: 1000 mg via INTRAVENOUS
  Filled 2021-01-11: qty 100

## 2021-01-11 MED ORDER — LEVETIRACETAM IN NACL 500 MG/100ML IV SOLN
500.0000 mg | Freq: Two times a day (BID) | INTRAVENOUS | Status: DC
  Administered 2021-01-11 – 2021-01-15 (×9): 500 mg via INTRAVENOUS
  Filled 2021-01-11 (×14): qty 100

## 2021-01-11 MED ORDER — DEXAMETHASONE 4 MG PO TABS
4.0000 mg | ORAL_TABLET | Freq: Four times a day (QID) | ORAL | Status: DC
  Administered 2021-01-11 – 2021-01-13 (×8): 4 mg via ORAL
  Filled 2021-01-11 (×13): qty 1

## 2021-01-11 MED ORDER — DEXAMETHASONE 4 MG PO TABS: 4.0000 mg | ORAL_TABLET | Freq: Four times a day (QID) | ORAL | Status: DC

## 2021-01-11 MED ORDER — METOPROLOL SUCCINATE ER 25 MG PO TB24
12.5000 mg | ORAL_TABLET | Freq: Every day | ORAL | Status: DC
  Administered 2021-01-12 – 2021-01-15 (×3): 12.5 mg via ORAL
  Administered 2021-01-16: 25 mg via ORAL
  Administered 2021-01-17: 12.5 mg via ORAL
  Filled 2021-01-11 (×7): qty 1

## 2021-01-11 MED ORDER — HEPARIN SODIUM (PORCINE) 5000 UNIT/ML IJ SOLN
5000.0000 [IU] | Freq: Three times a day (TID) | INTRAMUSCULAR | Status: DC
  Administered 2021-01-11 – 2021-01-17 (×16): 5000 [IU] via SUBCUTANEOUS
  Filled 2021-01-11 (×17): qty 1

## 2021-01-11 MED ORDER — VITAMIN B-12 1000 MCG PO TABS
1000.0000 ug | ORAL_TABLET | Freq: Every day | ORAL | Status: DC
  Administered 2021-01-12 – 2021-01-17 (×6): 1000 ug via ORAL
  Filled 2021-01-11 (×6): qty 1

## 2021-01-11 MED ORDER — ACETAMINOPHEN 650 MG RE SUPP: 650.0000 mg | Freq: Four times a day (QID) | RECTAL | Status: DC | PRN

## 2021-01-11 MED ORDER — LORAZEPAM 2 MG/ML IJ SOLN: 2.0000 mg | INTRAMUSCULAR | Status: DC | PRN

## 2021-01-11 MED ORDER — INSULIN ASPART 100 UNIT/ML ~~LOC~~ SOLN
0.0000 [IU] | Freq: Three times a day (TID) | SUBCUTANEOUS | Status: DC
  Administered 2021-01-12: 1 [IU] via SUBCUTANEOUS
  Administered 2021-01-14 (×2): 2 [IU] via SUBCUTANEOUS
  Filled 2021-01-11 (×3): qty 1

## 2021-01-11 MED ORDER — ONDANSETRON HCL 4 MG/2ML IJ SOLN: 4.0000 mg | Freq: Three times a day (TID) | INTRAMUSCULAR | Status: DC | PRN

## 2021-01-11 MED ORDER — ALBUMIN HUMAN 25 % IV SOLN
25.0000 g | Freq: Once | INTRAVENOUS | Status: AC
  Administered 2021-01-11: 25 g via INTRAVENOUS
  Filled 2021-01-11: qty 100

## 2021-01-11 MED ORDER — SODIUM CHLORIDE 0.9 % IV BOLUS (SEPSIS): 1000.0000 mL | Freq: Once | INTRAVENOUS | Status: DC

## 2021-01-11 MED ORDER — DRONABINOL 2.5 MG PO CAPS
5.0000 mg | ORAL_CAPSULE | Freq: Two times a day (BID) | ORAL | Status: DC
  Administered 2021-01-12 – 2021-01-17 (×9): 5 mg via ORAL
  Filled 2021-01-11 (×9): qty 2

## 2021-01-11 MED ORDER — DEXAMETHASONE 4 MG PO TABS
4.0000 mg | ORAL_TABLET | Freq: Four times a day (QID) | ORAL | Status: DC
  Filled 2021-01-11 (×2): qty 1

## 2021-01-11 NOTE — ED Notes (Signed)
Pt transported to room 138-1A by [facility]

## 2021-01-11 NOTE — H&P (Addendum)
History and Physical    DEZMEN ALCOCK ZOX:[id number] DOB: [birth year] DOA: 01/11/2021  Referring MD/NP/PA:   PCP: Juluis Pitch, MD   Patient coming from:  The patient is coming from home.  At baseline, pt is independent for most of ADL.        Chief Complaint: seizure  HPI: KEVIN SPACE is a 60 y.o. 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 Pt was discharged on Keppra 500 mg bid and decadron 4 mg tid. Pt states that he was unable to fill his Keppra prescription after recent discharge. He states that he feels jittery all over since last night.  States he had similar symptoms when he had a seizure and was admitted to [facility] on 01/06/2021.  Patient does not have unilateral numbness or tingling in his extremities, no facial droop or slurred speech.  Patient has mild dry cough, but denies chest pain, shortness of breath.  No fever or chills.  No nausea, vomiting, diarrhea, abdominal pain, symptoms of UTI.  He is followed by [name] with oncology.  He had his second cycle of Keytruda on 01/05/2021.  He has status post whole brain radiation.  Last chemotherapy was in October 2021. Pt is cheduled for G-tube placement tomorrow due to poor oral intake, weight loss and protein calorie malnutrition.  ED Course: pt was found to have WBC 15.6, negative urinalysis, negative COVID PCR, potassium 2.7, renal function okay, temperature 97.4, blood pressure soft, heart rate 120, 100, 22, oxygen saturation 95% on room air.  Chest x-ray is negative for new infiltration.  Patient is placed on MedSurg bed for observation   Review of Systems:   General: no fevers, chills, no body weight gain, has poor appetite, has fatigue HEENT: no  blurry vision, hearing changes or sore throat Respiratory: no dyspnea, has dry coughing, no wheezing CV: no chest pain, no palpitations GI: no nausea, vomiting, abdominal pain, diarrhea, constipation GU: no dysuria, burning on urination, increased urinary frequency, hematuria  Ext: no leg edema Neuro: no unilateral weakness, numbness, or tingling, no vision change or hearing loss Skin: no rash, no skin tear. MSK: No muscle spasm, no deformity, no limitation of range of movement in spin Heme: No easy bruising.  Travel history: No recent long distant travel.  Allergy:  Allergies  Allergen Reactions  . Penicillins Other (See Comments)    "everything felt distant"    Past Medical History:  Diagnosis Date  . Depression   . Diabetes mellitus without complication ([place])   . Hyperlipidemia   . Hypertension   . Lesion of brain   . Lung cancer ([place])   . Mass of lung   . Seizures ([place])     Past Surgical History:  Procedure Laterality Date  . 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 COLONOSCOPY    . CYSTOSCOPY W/ RETROGRADES Bilateral 07/14/2020   Procedure: CYSTOSCOPY WITH RETROGRADE PYELOGRAM;  Surgeon: Hollice Espy, MD;  Location: [facility] ORS;  Service: Urology;  Laterality: Bilateral;  . PORTA CATH INSERTION N/A 08/03/2020   Procedure: PORTA CATH INSERTION;  Surgeon: Katha Cabal, MD;  Location: [place] [facility];  Service: Cardiovascular;  Laterality: N/A;  . PORTA CATH INSERTION N/A 10/03/2020  Procedure: PORTA CATH INSERTION;  Surgeon: Algernon Huxley, MD;  Location: [place] [facility];  Service: Cardiovascular;  Laterality: N/A;  . VIDEO BRONCHOSCOPY WITH ENDOBRONCHIAL NAVIGATION N/A 07/01/2020   Procedure: VIDEO BRONCHOSCOPY WITH ENDOBRONCHIAL NAVIGATION;  Surgeon: [name], MD;  Location: [facility] ORS;  Service: Thoracic;  Laterality: N/A;  . VIDEO BRONCHOSCOPY WITH ENDOBRONCHIAL ULTRASOUND N/A 07/01/2020   Procedure: VIDEO BRONCHOSCOPY WITH ENDOBRONCHIAL ULTRASOUND;  Surgeon:  [name], MD;  Location: [facility] ORS;  Service: Thoracic;  Laterality: N/A;    Social History:  reports that he quit smoking about 6 months ago. His smoking use included cigarettes. He has a 64.00 pack-year smoking history. He has never used smokeless tobacco. He reports previous drug use. Drug: Marijuana. He reports that he does not drink alcohol.  Family History:  Family History  Problem Relation Age of Onset  . Diabetes Sister   . Diabetes Brother      Prior to Admission medications   Medication Sig Start Date End Date Taking? Authorizing Provider  levETIRAcetam (KEPPRA) 500 MG tablet Take 1 tablet (500 mg total) by mouth 2 (two) times daily. 01/11/21  Yes Ward, Kristen N, DO  dexamethasone (DECADRON) 4 MG tablet Take 1 tablet (4 mg total) by mouth 3 (three) times daily with meals. 01/08/21 03/06/21  Rehman, Areeg N, DO  dronabinol (MARINOL) 5 MG capsule Take 1 capsule (5 mg total) by mouth 2 (two) times daily before a meal. 12/05/20   [name], MD  levETIRAcetam (KEPPRA) 500 MG tablet Take 1 tablet (500 mg total) by mouth 2 (two) times daily. 01/08/21 2021/03/06  Rehman, Areeg N, DO  metFORMIN (GLUCOPHAGE) 500 MG tablet Take 1,000 mg by mouth 2 (two) times daily with a meal.    [provider]  metoprolol succinate (TOPROL-XL) 25 MG 24 hr tablet Take 0.5 tablets (12.5 mg total) by mouth daily. 11/15/20   Kate Sable, MD  potassium chloride SA (KLOR-CON) 20 MEQ tablet Take 1 tablet (20 mEq total) by mouth 2 (two) times daily. 11/18/20   [name], MD  vancomycin (VANCOCIN) 50 mg/mL oral solution Take 2.5 mLs (125 mg total) by mouth 4 (four) times daily. 11/10/20   Pahwani, Michell Heinrich, MD  vitamin B-12 (CYANOCOBALAMIN) 1000 MCG tablet Take 1,000 mcg by mouth daily.    [provider]    Physical Exam: Vitals:   01/11/21 0945 01/11/21 1000 01/11/21 1044 01/11/21 1556  BP:   116/82 93/65  Pulse:   95 81  Resp: 20 (!) 26 18 16   Temp:   98.2 F (36.8 C) 97.7 F  (36.5 C)  TempSrc:   Oral Oral  SpO2:   100% 97%  Weight:      Height:       General: Not in acute distress HEENT:       Eyes: PERRL, EOMI, no scleral icterus.       ENT: No discharge from the ears and nose, no pharynx injection, no tonsillar enlargement.        Neck: No JVD, no bruit, no mass felt. Heme: No neck lymph node enlargement. Cardiac: S1/S2, RRR, No murmurs, No gallops or rubs. Respiratory: No rales, wheezing, rhonchi or rubs. GI: Soft, nondistended, nontender, no rebound pain, no organomegaly, BS present. GU: No hematuria Ext: No pitting leg edema bilaterally. S/p of L AKA Musculoskeletal: No joint deformities, No joint redness or warmth, no limitation of ROM in spin. Skin: No rashes.  Neuro: Alert, oriented X3, cranial nerves II-XII  grossly intact, moves all extremities normally.  Psych: Patient is not psychotic, no suicidal or hemocidal ideation.  Labs on Admission: I have personally reviewed following labs and imaging studies  CBC: Recent Labs  Lab 01/05/21 0850 01/06/21 1428 01/06/21 1434 01/07/21 0524 01/11/21 0659  WBC 9.0 14.4*  --  7.7 15.6*  NEUTROABS 6.4 10.7*  --   --  13.2*  HGB 12.3* 13.0 14.3 11.2* 12.2*  HCT 36.5* 40.3 42.0 33.5* 37.2*  MCV 88.8 91.4  --  89.6 90.7  PLT 340 363  --  259 892   Basic Metabolic Panel: Recent Labs  Lab 01/05/21 0857 01/06/21 1428 01/06/21 1434 01/07/21 0524 01/08/21 0144 01/11/21 0659 01/11/21 1549  NA  --  135 136 139 139 136 137  K  --  2.5* 2.4* 3.2* 3.5 2.7* 3.7  CL  --  97* 96* 101 104 96* 101  CO2  --  16*  --  26 26 26 25   GLUCOSE  --  223* 219* 138* 112* 117* 108*  BUN  --  <5* <3* <5* <5* 8 8  CREATININE  --  0.84 0.50* 0.53* 0.54* 0.45* 0.50*  CALCIUM  --  8.7*  --  8.4* 8.2* 8.3* 8.5*  MG 1.5* 1.5*  --   --  2.0 1.9  --   PHOS 3.9  --   --   --   --   --   --    GFR: Estimated Creatinine Clearance: 103.4 mL/min (A) (by C-G formula based on SCr of 0.5 mg/dL (L)). Liver Function  Tests: Recent Labs  Lab 01/05/21 0850 01/06/21 1428 01/07/21 0524 01/11/21 0659  AST 29 36 24 23  ALT 21 23 19 23   ALKPHOS 166* 189* 157* 147*  BILITOT 0.8 0.8 0.8 1.1  PROT 6.7 6.2* 5.8* 6.4*  ALBUMIN 2.7* 2.5* 2.2* 2.6*   No results for input(s): LIPASE, AMYLASE in the last 168 hours. No results for input(s): AMMONIA in the last 168 hours. Coagulation Profile: Recent Labs  Lab 01/06/21 1428 01/07/21 0524 01/11/21 0659  INR 1.2 1.2 1.2   Cardiac Enzymes: No results for input(s): CKTOTAL, CKMB, CKMBINDEX, TROPONINI in the last 168 hours. BNP (last 3 results) No results for input(s): PROBNP in the last 8760 hours. HbA1C: No results for input(s): HGBA1C in the last 72 hours. CBG: Recent Labs  Lab 01/08/21 0647 01/08/21 1227 01/08/21 1618 01/11/21 1125 01/11/21 1553  GLUCAP 111* 108* 128* 89 98   Lipid Profile: No results for input(s): CHOL, HDL, LDLCALC, TRIG, CHOLHDL, LDLDIRECT in the last 72 hours. Thyroid Function Tests: No results for input(s): TSH, T4TOTAL, FREET4, T3FREE, THYROIDAB in the last 72 hours. Anemia Panel: No results for input(s): VITAMINB12, FOLATE, FERRITIN, TIBC, IRON, RETICCTPCT in the last 72 hours. Urine analysis:    Component Value Date/Time   COLORURINE STRAW (A) 01/11/2021 0732   APPEARANCEUR CLEAR (A) 01/11/2021 0732   APPEARANCEUR Clear 07/13/2020 0846   LABSPEC 1.002 (L) 01/11/2021 0732   PHURINE 6.0 01/11/2021 0732   GLUCOSEU NEGATIVE 01/11/2021 0732   HGBUR NEGATIVE 01/11/2021 0732   BILIRUBINUR NEGATIVE 01/11/2021 0732   BILIRUBINUR Negative 07/13/2020 0846   KETONESUR 5 (A) 01/11/2021 0732   PROTEINUR NEGATIVE 01/11/2021 0732   NITRITE NEGATIVE 01/11/2021 0732   LEUKOCYTESUR NEGATIVE 01/11/2021 0732   Sepsis Labs: @LABRCNTIP (procalcitonin:4,lacticidven:4) ) Recent Results (from the past 240 hour(s))  SARS CORONAVIRUS 2 (TAT 6-24 HRS) Nasopharyngeal Nasopharyngeal Swab     Status: None   Collection Time:  01/06/21  3:18  PM   Specimen: Nasopharyngeal Swab  Result Value Ref Range Status   SARS Coronavirus 2 NEGATIVE NEGATIVE Final    Comment: (NOTE) SARS-CoV-2 target nucleic acids are NOT DETECTED.  The SARS-CoV-2 RNA is generally detectable in upper and lower respiratory specimens during the acute phase of infection. Negative results do not preclude SARS-CoV-2 infection, do not rule out co-infections with other pathogens, and should not be used as the sole basis for treatment or other patient management decisions. Negative results must be combined with clinical observations, patient history, and epidemiological information. The expected result is Negative.  Fact Sheet for Patients: SugarRoll.be  Fact Sheet for Healthcare Providers: https://www.woods-mathews.com/  This test is not yet approved or cleared by the [place] FDA and  has been authorized for detection and/or diagnosis of SARS-CoV-2 by FDA under an Emergency Use Authorization (EUA). This EUA will remain  in effect (meaning this test can be used) for the duration of the COVID-19 declaration under Se ction 564(b)(1) of the Act, 21 U.S.C. section 360bbb-3(b)(1), unless the authorization is terminated or revoked sooner.  Performed at [facility], [place] [address] [place], [place] [address]   Resp Panel by RT-PCR (Flu A&B, Covid) Nasopharyngeal Swab     Status: None   Collection Time: 01/11/21  7:32 AM   Specimen: Nasopharyngeal Swab; Nasopharyngeal(NP) swabs in vial transport medium  Result Value Ref Range Status   SARS Coronavirus 2 by RT PCR NEGATIVE NEGATIVE Final    Comment: (NOTE) SARS-CoV-2 target nucleic acids are NOT DETECTED.  The SARS-CoV-2 RNA is generally detectable in upper respiratory specimens during the acute phase of infection. The lowest concentration of SARS-CoV-2 viral copies this assay can detect is 138 copies/mL. A negative result does not preclude  SARS-Cov-2 infection and should not be used as the sole basis for treatment or other patient management decisions. A negative result may occur with  improper specimen collection/handling, submission of specimen other than nasopharyngeal swab, presence of viral mutation(s) within the areas targeted by this assay, and inadequate number of viral copies(<138 copies/mL). A negative result must be combined with clinical observations, patient history, and epidemiological information. The expected result is Negative.  Fact Sheet for Patients:  EntrepreneurPulse.com.au  Fact Sheet for Healthcare Providers:  IncredibleEmployment.be  This test is no t yet approved or cleared by the [place] FDA and  has been authorized for detection and/or diagnosis of SARS-CoV-2 by FDA under an Emergency Use Authorization (EUA). This EUA will remain  in effect (meaning this test can be used) for the duration of the COVID-19 declaration under Section 564(b)(1) of the Act, 21 U.S.C.section 360bbb-3(b)(1), unless the authorization is terminated  or revoked sooner.       Influenza A by PCR NEGATIVE NEGATIVE Final   Influenza B by PCR NEGATIVE NEGATIVE Final    Comment: (NOTE) The Xpert Xpress SARS-CoV-2/FLU/RSV plus assay is intended as an aid in the diagnosis of influenza from Nasopharyngeal swab specimens and should not be used as a sole basis for treatment. Nasal washings and aspirates are unacceptable for Xpert Xpress SARS-CoV-2/FLU/RSV testing.  Fact Sheet for Patients: EntrepreneurPulse.com.au  Fact Sheet for Healthcare Providers: IncredibleEmployment.be  This test is not yet approved or cleared by the [place] FDA and has been authorized for detection and/or diagnosis of SARS-CoV-2 by FDA under an Emergency Use Authorization (EUA). This EUA will remain in effect (meaning this test can be used) for the duration of  the COVID-19 declaration under  Section 564(b)(1) of the Act, 21 U.S.C. section 360bbb-3(b)(1), unless the authorization is terminated or revoked.  Performed at [facility], [address] [place], [place] [address]      Radiological Exams on Admission: DG Chest 2 View  Result Date: 01/11/2021 CLINICAL DATA:  Tachycardia.  History of lung carcinoma EXAM: CHEST - 2 VIEW COMPARISON:  Chest radiograph August 18, 2020; chest CT November 08, 2020 FINDINGS: Port-A-Cath tip is in the superior vena cava. No pneumothorax. There is underlying emphysematous change with apparent bullous disease in the upper lobes. There is no edema or airspace opacity. The previously noted right lower lobe mass seen on most recent CT is not appreciable by radiography. Heart size is normal. Pulmonary vascularity reflects underlying emphysematous change. No adenopathy. No bone lesions. IMPRESSION: Port-A-Cath tip in superior vena cava. No pneumothorax. Underlying emphysematous change. No edema or airspace opacity. Right lower lobe mass noted on previous CT not appreciable by radiography. Heart size within normal limits. No adenopathy appreciable by radiography. Emphysema (ICD10-J43.9). Electronically Signed   By: Lowella Grip III M.D.   On: 01/11/2021 08:27     EKG: I have personally reviewed.  Sinus rhythm, QTC 501, low voltage, nonspecific T wave change   Assessment/Plan Principal Problem:   Seizures (HCC) Active Problems:   Hyperlipidemia   Type 2 diabetes mellitus without complication, without long-term current use of insulin (HCC)   Brain metastases (HCC)   Malignant neoplasm of lower lobe of right lung (HCC)   Chronic systolic CHF (congestive heart failure) (HCC)   Hypokalemia   Leucocytosis   Protein-calorie malnutrition, moderate (HCC)   Seizure (HCC)   HTN (hypertension)   Seizures ([place]): Patient was recently hospitalized to [facility] 4 mg every 6 hours 01/7-1/9 due to seizure which  is 2/2 to 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 Pt is supposed to take Keppra 500 mg bid, but he did not start this med yet. He presents with similar symptoms with a previous seizure.  -admit to MedSurg bed for observation -Patient is loaded with 1000 mg of Keppra in ED -will continue Keppra 500 mg twice daily by renal -Seizure precaution -When necessary Ativan for seizure  Protein-calorie malnutrition, moderate: pt is scheduled for G-tube replacement by Dr.Pabon of general surgery tomorrow. -I sent message to PA, Otho Ket, hoping they can do the procedure while patient is in hospital -NPO after MN  Hyperlipidemia -pt was taking Zocor, but not taking it now  Type 2 diabetes mellitus without complication, without long-term current use of insulin ([place]): Recent A1c 5.7, well controlled.  Patient taking Actos -SSI  Malignant neoplasm of lower lobe of right lung with bain metastases ([place]): - 4 mg decadron q6h -f/u [name] of oncology  Chronic systolic CHF (congestive heart failure) [facility]): Patient does not have leg edema or JVD.  CHF seems to be compensated.  2D echo obtained/27/21 showed a EF of 30-35%.  Patient is not taking diuretics.  Patient received 1 L LR and 500 cc normal saline 80 -Check BNP -Carefully given IV fluid  Hypokalemia: K= 2.7 on admission. - Repleted - Check Mg level -->  1.9 - Give 1 g of magnesium sulfate  Leucocytosis: WBC 15.6. No source of infection identified, possibly due to Decadron use. UA negative. CXR negative for new infiltration.  Procalcitonin level<0.10.  Patient received 1 dose of cefepime by ED. will not continue antibiotics -Follow-up of blood culture and urine culture -f/u by CBC  HTN: -Metoprolol  DVT ppx: SQ Heparin    Code Status: Full code Family Communication: not done, no family member is at bed side.          Disposition Plan:  Anticipate discharge back to previous environment Consults called:  PA, Otho Ket of  general surgery Admission status: Med-surg bed for obs   Status is: Observation  The patient remains OBS appropriate and will d/c before 2 midnights.  Dispo: The patient is from: Home              Anticipated d/c is to: Home              Anticipated d/c date is: 1 day              Patient currently is not medically stable to d/c.          Date of Service 01/11/2021    Ivor Costa [facility]   If 7PM-7AM, please contact night-coverage [email or web address] 01/11/2021, 6:29 PM

## 2021-01-11 NOTE — ED Triage Notes (Addendum)
Pt to triage via w/c with no distress noted; pt recently d/c from [facility] for new onset seizures; st new keppra rx was never called in to pharmacy; pt with hx lung CA, metastasis to liver and brain, FTT & awaiting g-tube placement; pt c/o weakness

## 2021-01-11 NOTE — ED Provider Notes (Signed)
ICP of this patient approximately 0 700.  Please see above for full details) initial evaluation and assessment.  In brief patient presents with a history of metastatic lung cancer currently undergoing chemotherapy with known brain mets and recent admission for seizure discharged on 1/9 presents feeling jittery and was noted to be tachycardic on arrival.  Patient reportedly was unable to fill his Keppra prescription after recent discharge.  Plan is to check some basic labs including urine and give patient some IV fluids as well as load him with Keppra.  If labs are reassuring plan is for discharge with outpatient follow-up.  Repeat prescription for Keppra sent.  CBC remarkable for WBC count of 15.6 with unremarkable hemoglobin.  This compares to 7.7 obtained on the eighth.  CMP remarkable for K of 2.7 but no other significant derangements.  Patient's lactic acid, magnesium, INR, and troponin are all within normal limits.  Chest x-ray shows no clearance of pneumonia and urine does not appear infected.  Given tachycardia and elevated white blood cell count from when he was discharged on the ninth with just reasonable to obtain for possible bacteremia.  No clear source of infection on exam.  Blood cultures obtained patient given IV fluids and cefepime.  Will admit to hospitalist service.  Marland Kitchen1-3 Lead EKG Interpretation Performed by: Lucrezia Starch, MD Authorized by: Lucrezia Starch, MD     Interpretation: normal     ECG rate assessment: tachycardic     Rhythm: sinus tachycardia     Ectopy: none     Conduction: normal     Medications  lactated ringers bolus 500 mL (has no administration in time range)  LORazepam (ATIVAN) injection 2 mg (has no administration in time range)  ondansetron (ZOFRAN) injection 4 mg (has no administration in time range)  acetaminophen (TYLENOL) suppository 650 mg (has no administration in time range)  hydrALAZINE (APRESOLINE) injection 5 mg (has no administration in time  range)  levETIRAcetam (KEPPRA) IVPB 500 mg/100 mL premix (has no administration in time range)  levETIRAcetam (KEPPRA) IVPB 1000 mg/100 mL premix (0 mg Intravenous Stopped 01/11/21 0749)  sodium chloride 0.9 % bolus 500 mL (0 mLs Intravenous Stopped 01/11/21 0843)  potassium chloride SA (KLOR-CON) CR tablet 80 mEq (80 mEq Oral Given 01/11/21 0827)  ceFEPIme (MAXIPIME) 2 g in sodium chloride 0.9 % 100 mL IVPB (2 g Intravenous New Bag/Given 01/11/21 0826)      Lucrezia Starch, MD 01/11/21 [phone]

## 2021-01-11 NOTE — Progress Notes (Signed)
Received report from ED nurse, Chauncey Reading RN. Pt transferring from ED to room 138 on 1A.

## 2021-01-11 NOTE — Plan of Care (Signed)
  Problem: Education: Goal: Knowledge of General Education information will improve Description: Including pain rating scale, medication(s)/side effects and non-pharmacologic comfort measures Outcome: Progressing   Problem: Health Behavior/Discharge Planning: Goal: Ability to manage health-related needs will improve Outcome: Progressing   Problem: Clinical Measurements: Goal: Ability to maintain clinical measurements within normal limits will improve Outcome: Progressing Goal: Will remain free from infection Outcome: Progressing Goal: Diagnostic test results will improve Outcome: Progressing Goal: Respiratory complications will improve Outcome: Progressing Goal: Cardiovascular complication will be avoided Outcome: Progressing   Problem: Activity: Goal: Risk for activity intolerance will decrease Outcome: Progressing   Problem: Nutrition: Goal: Adequate nutrition will be maintained Outcome: Progressing   Problem: Elimination: Goal: Will not experience complications related to bowel motility Outcome: Progressing Goal: Will not experience complications related to urinary retention Outcome: Progressing   Problem: Pain Managment: Goal: General experience of comfort will improve Outcome: Progressing   Problem: Safety: Goal: Ability to remain free from injury will improve Outcome: Progressing   Problem: Skin Integrity: Goal: Risk for impaired skin integrity will decrease Outcome: Progressing   Problem: Education: Goal: Ability to describe self-care measures that may prevent or decrease complications (Diabetes Survival Skills Education) will improve Outcome: Progressing Goal: Individualized Educational Video(s) Outcome: Progressing

## 2021-01-11 NOTE — ED Provider Notes (Signed)
[facility] Emergency Department Provider Note   ____________________________________________   Event Date/Time   First MD Initiated Contact with Patient 01/11/21 [phone]     (approximate)  I have reviewed the triage vital signs and the nursing notes.   HISTORY  Chief Complaint Weakness    HPI Tyler Gallagher is a 60 y.o. 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  States he felt very "jittery" all over.  States he had similar symptoms when he had a seizure and was admitted to [facility]  on 01/06/2021.  It appears at that time he had episode of shaking in his upper extremities that then became generalized and had right-sided weakness and left gaze preference.  CT of his head at that time showed vasogenic edema and progression of metastasis.  He was discharged on Decadron and Keppra.  States he has been on Decadron but states his pharmacy did not have a prescription for Keppra.  He is not on any other seizure medications.  He states that he has been weak all over and this has been ongoing for a month.  He is scheduled to have a G-tube placed tomorrow due to poor p.o. intake, weight loss.  He denies any fevers, new headache, neck pain or neck stiffness, chest pain or shortness of breath, nausea or vomiting, diarrhea, bloody stools or melena, focal numbness or weakness.  He has had a dry cough.  It appears patient was diagnosed with lung cancer in June 2021 at [facility].  Patient underwent craniotomy with resection in July 2021 at [facility] was discharged on a brief course of Keppra.  He did not have any seizures until 01/06/2021.  He is followed by [name] with oncology.  He had his second cycle of Keytruda on 01/05/2021.  He has status post whole  brain radiation.  Last chemotherapy was in October 2021.    Past Medical History:  Diagnosis Date  . Depression   . Diabetes mellitus without complication ([place])   . Hyperlipidemia   . Hypertension   . Lesion of brain   . Lung cancer ([place])   . Mass of lung   . Seizures [facility])     Patient Active Problem List   Diagnosis Date Noted  . Seizure ([place]) 01/06/2021  . Dizziness 11/08/2020  . Vertigo 11/08/2020  . Chronic systolic CHF (congestive heart failure) ([place]) 11/08/2020  . Hypomagnesemia 11/08/2020  . Hypotension 11/08/2020  . Hypokalemia 11/08/2020  . Nausea vomiting and diarrhea 11/08/2020  . Anxiety 11/08/2020  . Palliative care encounter   . Chemotherapy induced neutropenia ([place]) 09/29/2020  . Goals of care, counseling/discussion 07/08/2020  . Malignant neoplasm of lower lobe of right lung ([place]) 07/08/2020  . Brain metastases ([place]) 06/22/2020  . Vasogenic brain edema ([place]) 06/21/2020  . Hyperlipidemia 01/02/2016  . Hypertriglyceridemia 01/02/2016  . Tobacco abuse 01/02/2016  . Type 2 diabetes mellitus without complication, without long-term current use of insulin ([place]) 01/02/2016  . Obesity 01/02/2016  . S/P AKA (above knee amputation) unilateral ([place]) 01/02/2016  . Amaurosis fugax 01/01/2016  . TIA (transient ischemic attack) 01/01/2016    Past Surgical History:  Procedure Laterality Date  . 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 COLONOSCOPY    . CYSTOSCOPY W/ RETROGRADES Bilateral 07/14/2020   Procedure: CYSTOSCOPY WITH RETROGRADE  PYELOGRAM;  Surgeon: Hollice Espy, MD;  Location: [facility] ORS;  Service: Urology;  Laterality: Bilateral;  . PORTA CATH INSERTION N/A 08/03/2020   Procedure: PORTA CATH INSERTION;  Surgeon: Katha Cabal, MD;  Location: [place] [facility];  Service: Cardiovascular;  Laterality: N/A;  . PORTA CATH INSERTION N/A 10/03/2020   Procedure: PORTA CATH INSERTION;  Surgeon: Algernon Huxley, MD;  Location: [place] [facility];  Service: Cardiovascular;   Laterality: N/A;  . VIDEO BRONCHOSCOPY WITH ENDOBRONCHIAL NAVIGATION N/A 07/01/2020   Procedure: VIDEO BRONCHOSCOPY WITH ENDOBRONCHIAL NAVIGATION;  Surgeon: [name], MD;  Location: [facility] ORS;  Service: Thoracic;  Laterality: N/A;  . VIDEO BRONCHOSCOPY WITH ENDOBRONCHIAL ULTRASOUND N/A 07/01/2020   Procedure: VIDEO BRONCHOSCOPY WITH ENDOBRONCHIAL ULTRASOUND;  Surgeon: [name], MD;  Location: [facility] ORS;  Service: Thoracic;  Laterality: N/A;    Prior to Admission medications   Medication Sig Start Date End Date Taking? Authorizing Provider  levETIRAcetam (KEPPRA) 500 MG tablet Take 1 tablet (500 mg total) by mouth 2 (two) times daily. 01/11/21  Yes Mallerie Blok N, DO  dexamethasone (DECADRON) 4 MG tablet Take 1 tablet (4 mg total) by mouth 3 (three) times daily with meals. 01/08/21 2021-02-16  Rehman, Areeg N, DO  dronabinol (MARINOL) 5 MG capsule Take 1 capsule (5 mg total) by mouth 2 (two) times daily before a meal. 12/05/20   [name], MD  levETIRAcetam (KEPPRA) 500 MG tablet Take 1 tablet (500 mg total) by mouth 2 (two) times daily. 01/08/21 02-16-2021  Rehman, Areeg N, DO  metFORMIN (GLUCOPHAGE) 500 MG tablet Take 1,000 mg by mouth 2 (two) times daily with a meal.    [provider]  metoprolol succinate (TOPROL-XL) 25 MG 24 hr tablet Take 0.5 tablets (12.5 mg total) by mouth daily. 11/15/20   Kate Sable, MD  potassium chloride SA (KLOR-CON) 20 MEQ tablet Take 1 tablet (20 mEq total) by mouth 2 (two) times daily. 11/18/20   [name], MD  vancomycin (VANCOCIN) 50 mg/mL oral solution Take 2.5 mLs (125 mg total) by mouth 4 (four) times daily. 11/10/20   Pahwani, Michell Heinrich, MD  vitamin B-12 (CYANOCOBALAMIN) 1000 MCG tablet Take 1,000 mcg by mouth daily.    [provider]    Allergies Penicillins  Family History  Problem Relation Age of Onset  . Diabetes Sister   . Diabetes Brother     Social History Social History   Tobacco Use  . Smoking status:  Former Smoker    Packs/day: 2.00    Years: 32.00    Pack years: 64.00    Types: Cigarettes    Quit date: 06/21/2020    Years since quitting: 0.5  . Smokeless tobacco: Never Used  Vaping Use  . Vaping Use: Never used  Substance Use Topics  . Alcohol use: No    Alcohol/week: 0.0 standard drinks  . Drug use: Not Currently    Types: Marijuana    Review of Systems  Constitutional: No fever/chills Eyes: No visual changes. ENT: No sore throat. Cardiovascular: Denies chest pain. Respiratory: Denies shortness of breath. Gastrointestinal: No abdominal pain.  No nausea, no vomiting.  No diarrhea.  No constipation. Genitourinary: Negative for dysuria. Musculoskeletal: Negative for back pain. Skin: Negative for rash. Neurological: Negative for headaches, focal weakness or numbness.   ____________________________________________   PHYSICAL EXAM:  VITAL SIGNS: ED Triage Vitals [01/11/21 0614]  Enc Vitals Group     BP 111/84     Pulse Rate (!) 120  Resp 18     Temp 98 F (36.7 C)     Temp Source Oral     SpO2 98 %     Weight 160 lb (72.6 kg)     Height 6\' 3"  (1.905 m)     Head Circumference      Peak Flow      Pain Score 0     Pain Loc      Pain Edu?      Excl. in [place]?     Constitutional: Alert and oriented x4.  Chronically ill-appearing.  In no acute distress. Eyes: Conjunctivae are normal. PERRL. EOMI. no conjunctival pallor. Head: Atraumatic. Nose: No congestion/rhinnorhea. Mouth/Throat: Slightly dry membranes are moist.  Oropharynx non-erythematous. Neck: No stridor.   Cardiovascular: Regular and tachycardic. Grossly normal heart sounds.  Good peripheral circulation. Respiratory: Normal respiratory effort.  No retractions. Lungs CTAB. Gastrointestinal: Soft and nontender. No distention. No abdominal bruits. No CVA tenderness. Musculoskeletal: No lower extremity tenderness nor edema.  No joint effusions.  Status post left AKA. Neurologic:  Normal speech and  language. No gross focal neurologic deficits are appreciated.  Normal sensation diffusely, moves all extremities equally, no drift, cranial nerves II through XII intact.  Wheelchair-bound at baseline. Skin:  Skin is warm, dry and intact. No rash noted. Psychiatric: Mood and affect are normal. Speech and behavior are normal.  ____________________________________________   LABS (all labs ordered are listed, but only abnormal results are displayed)  Labs Reviewed  CBC WITH DIFFERENTIAL/PLATELET  COMPREHENSIVE METABOLIC PANEL  URINALYSIS, ROUTINE W REFLEX MICROSCOPIC  MAGNESIUM   ____________________________________________  EKG   Date: 01/11/2021 6:23  Rate: 109  Rhythm: sinus tachycardia   QRS Axis: normal  Intervals: Prolonged QT interval  ST/T Wave abnormalities: normal  Conduction Disutrbances: none  Narrative Interpretation: Sinus tachycardia, prolonged QT interval     ____________________________________________  RADIOLOGY  ED MD interpretation:  None   Official radiology report(s): No results found.  ____________________________________________   PROCEDURES  Procedure(s) performed: None  Procedures  Critical Care performed: No  ____________________________________________   INITIAL IMPRESSION / ASSESSMENT AND PLAN / ED COURSE  As part of my medical decision making, I reviewed the following data within the [facility] notes reviewed and incorporated, EKG interpreted no significant changes from prior EKG, Old EKG reviewed, Old chart reviewed, Patient signed out to Dr. Tamala Julian and Notes from prior ED visits     Patient here with generalized weakness that has been ongoing for months and concerns for shaking all over that he was concerned would lead to seizure-like activity.  No focal neurologic deficits today.  No infectious symptoms other than dry cough.  Was initially tachycardic and does appear dry on exam.  Will give gentle IV  hydration given history of cardiomyopathy with a EF of 30%.  Will check rectal temperature to ensure no infectious etiology causing tachycardia, weakness, shaking.  We will check for anemia, electrolyte derangement that could be leading to his weakness although I suspect it is due to decreased oral intake from his stage IV cancer.  He is scheduled for G-tube placement tomorrow.  We will check urine to evaluate for infection, dehydration.  EKG shows prolonged QT interval but no ischemia.  No arrhythmia.  Signed out to Dr. Tamala Julian to follow-up on patient work-up.  Patient comfortable with this plan.  Will load with IV Keppra here in the emergency department.  I have refilled his Keppra and sent it to the pharmacy of his  choice.      ____________________________________________   FINAL CLINICAL IMPRESSION(S) / ED DIAGNOSES  Final diagnoses:  Generalized weakness     ED Discharge Orders         Ordered    levETIRAcetam (KEPPRA) 500 MG tablet  2 times daily        01/11/21 0646           Note:  This document was prepared using Dragon voice recognition software and may include unintentional dictation errors.    Akaash Vandewater, Delice Bison, DO 01/11/21 [id number]

## 2021-01-11 NOTE — ED Notes (Signed)
Pt given urinal and blanket

## 2021-01-11 NOTE — Consult Note (Signed)
PHARMACY -  BRIEF ANTIBIOTIC NOTE   Pharmacy has received consult(s) for Cefepime from an ED provider.  The patient's profile has been reviewed for ht/wt/allergies/indication/available labs.    One time order(s) placed for Cefepime 2g IV x 1  Reviewed PCN allergy not anaphylaxis  Further antibiotics/pharmacy consults should be ordered by admitting physician if indicated.                       Thank you,  Lu Duffel, PharmD, BCPS Clinical Pharmacist 01/11/2021 8:03 AM

## 2021-01-11 NOTE — ED Notes (Signed)
Called lab and they will add on labs to blood already in the lab.

## 2021-01-11 NOTE — Progress Notes (Signed)
D/w [name] admitted w seizure and encephalopathy. Oncology planning to increase steroid dose. We will postpone G tube for now until his encephalopathy and neurological issues stabilize.

## 2021-01-12 ENCOUNTER — Encounter: Admission: RE | Payer: Self-pay | Source: Home / Self Care

## 2021-01-12 ENCOUNTER — Encounter: Payer: Self-pay | Admitting: Nurse Practitioner

## 2021-01-12 ENCOUNTER — Ambulatory Visit: Admission: RE | Admit: 2021-01-12 | Payer: Medicare HMO | Source: Home / Self Care | Admitting: Surgery

## 2021-01-12 DIAGNOSIS — G40909 Epilepsy, unspecified, not intractable, without status epilepticus: Principal | ICD-10-CM

## 2021-01-12 DIAGNOSIS — Z9114 Patient's other noncompliance with medication regimen: Secondary | ICD-10-CM

## 2021-01-12 DIAGNOSIS — E860 Dehydration: Secondary | ICD-10-CM

## 2021-01-12 DIAGNOSIS — E46 Unspecified protein-calorie malnutrition: Secondary | ICD-10-CM

## 2021-01-12 DIAGNOSIS — C787 Secondary malignant neoplasm of liver and intrahepatic bile duct: Secondary | ICD-10-CM | POA: Diagnosis not present

## 2021-01-12 DIAGNOSIS — R569 Unspecified convulsions: Secondary | ICD-10-CM | POA: Diagnosis not present

## 2021-01-12 DIAGNOSIS — C7931 Secondary malignant neoplasm of brain: Secondary | ICD-10-CM | POA: Diagnosis not present

## 2021-01-12 DIAGNOSIS — C3490 Malignant neoplasm of unspecified part of unspecified bronchus or lung: Secondary | ICD-10-CM

## 2021-01-12 DIAGNOSIS — R531 Weakness: Secondary | ICD-10-CM | POA: Diagnosis not present

## 2021-01-12 DIAGNOSIS — E43 Unspecified severe protein-calorie malnutrition: Secondary | ICD-10-CM | POA: Insufficient documentation

## 2021-01-12 DIAGNOSIS — R4182 Altered mental status, unspecified: Secondary | ICD-10-CM | POA: Diagnosis not present

## 2021-01-12 DIAGNOSIS — I5022 Chronic systolic (congestive) heart failure: Secondary | ICD-10-CM | POA: Diagnosis not present

## 2021-01-12 LAB — BASIC METABOLIC PANEL
Anion gap: 8 (ref 5–15)
BUN: 8 mg/dL (ref 6–20)
CO2: 25 mmol/L (ref 22–32)
Calcium: 8.4 mg/dL — ABNORMAL LOW (ref 8.9–10.3)
Chloride: 102 mmol/L (ref 98–111)
Creatinine, Ser: 0.43 mg/dL — ABNORMAL LOW (ref 0.61–1.24)
GFR, Estimated: >60 mL/min (ref >60)
Glucose, Bld: 120 mg/dL — ABNORMAL HIGH (ref 70–99)
Potassium: 3.9 mmol/L (ref 3.5–5.1)
Sodium: 135 mmol/L (ref 135–145)

## 2021-01-12 LAB — CBC
HCT: 34.2 % — ABNORMAL LOW (ref 39.0–52.0)
Hemoglobin: 11 g/dL — ABNORMAL LOW (ref 13.0–17.0)
MCH: 29.4 pg (ref 26.0–34.0)
MCHC: 32.2 g/dL (ref 30.0–36.0)
MCV: 91.4 fL (ref 80.0–100.0)
Platelets: 260 10*3/uL (ref 150–400)
RBC: 3.74 MIL/uL — ABNORMAL LOW (ref 4.22–5.81)
RDW: 14.9 % (ref 11.5–15.5)
WBC: 10 10*3/uL (ref 4.0–10.5)
nRBC: 0 % (ref 0.0–0.2)

## 2021-01-12 LAB — GLUCOSE, CAPILLARY
Glucose-Capillary: 107 mg/dL — ABNORMAL HIGH (ref 70–99)
Glucose-Capillary: 131 mg/dL — ABNORMAL HIGH (ref 70–99)
Glucose-Capillary: 103 mg/dL — ABNORMAL HIGH (ref 70–99)
Glucose-Capillary: 174 mg/dL — ABNORMAL HIGH (ref 70–99)

## 2021-01-12 SURGERY — INSERTION, GASTROSTOMY TUBE, ROBOT-ASSISTED
Anesthesia: General

## 2021-01-12 NOTE — Progress Notes (Signed)
PROGRESS NOTE    Tyler Gallagher  HQI:[id number] DOB: 1961/12/21 DOA: 01/11/2021 PCP: Juluis Pitch, MD    Brief Narrative:  60 y.o. 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 Pt was discharged on Keppra 500 mg bid and decadron 4 mg tid. Pt states that he was unable to fill his Keppra prescription after recent discharge. He states that he feels jittery all over since last night. States he had similar symptoms when he had a seizure and was admitted to [facility] 01/06/2021.  Patient does not have unilateral numbness or tingling in his extremities, no facial droop or slurred speech.  Patient has mild dry cough, but denies chest pain, shortness of breath.  No fever or chills.  No nausea, vomiting, diarrhea, abdominal pain, symptoms of UTI.  He is followed by Dr.Raowith oncology. He had his second cycle of Keytruda on 01/05/2021. He has status post whole brain radiation. Last chemotherapy was in October 2021. Pt is cheduled for G-tube placement tomorrow due to poor oral intake, weight loss and protein calorie malnutrition.  ED Course: pt was found to have WBC 15.6, negative urinalysis, negative COVID PCR, potassium 2.7, renal function okay, temperature 97.4, blood pressure soft, heart rate 120, 100, 22, oxygen saturation 95% on room air.  Chest x-ray is negative for new infiltration  Assessment & Plan:   Principal Problem:   Seizures ([place]) Active Problems:   Hyperlipidemia   Type 2 diabetes mellitus without complication, without long-term current use of insulin (HCC)   Brain metastases (HCC)   Malignant neoplasm of lower lobe of right lung (HCC)   Chronic systolic CHF (congestive heart failure) (HCC)    Hypokalemia   Leucocytosis   Protein-calorie malnutrition, moderate (HCC)   Seizure (HCC)   HTN (hypertension)   Seizures ([place]):  -Patient was recently hospitalized to [facility] 1/7-1/9 due to seizure which is 2/2 to 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  -Pt is supposed to take Keppra 500 mg bid, but he did not start this med secondary to barriers obtaining meds, per pt -Presents with recurrent seizure -Now on scheduled keppra and decadron 4mg  q6hrs -Thus far seizure free -MRI brain reviewed with Neurology over phone. Pt noted to have an increase in size and heterogeneity of parasagittal L parietal lesion with new extensive surrounding edema. See below, per Neurology at this time, benefits to G tube placement may outweigh neurologic risk -Continue PRN ativan for breakthrough seizures  Protein-calorie malnutrition, moderate:  -Discussed with Surgery. Plan for G-tube placement 1/14 for supplemental nutrition -Discussed with Neurology who reviewed MRI with me over phone. Per Neurology, benefits to G tube placement may outweigh neurologic risk at this time -This AM, pt noted to be alert, oriented, neurologically intact and eating breakfast -Dietitian following  Hyperlipidemia -pt was taking Zocor, but not taking it currently  Type 2 diabetes mellitus without complication, without long-term current use of insulin ([place]):  -Recent A1c 5.7, well controlled.  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 of oncology  Chronic systolic CHF (congestive heart failure) [facility]):  -Patient does not have leg edema or  JVD.   -CHF seems to be compensated.  2D echo obtained/27/21 showed a EF of 30-35%.   -Patient is not taking diuretics.   -Appears to be euvolemic at this time  Hypokalemia: K= 2.7 on admission. - Repleted - Check Mg level -->   1.9 - Give 1 g of magnesium sulfate -Repeat lytes in AM  Leucocytosis:  -Presenting WBC 15.6. No source of infection identified, possibly due to Decadron use. UA negative. CXR negative for new infiltration.  Procalcitonin level<0.10.  Patient received 1 dose of cefepime by ED. will not continue antibiotics -Follow-up of blood culture and urine culture -recheck cbc in AM  HTN: -Cont with Metoprolol as tolerated  DVT prophylaxis: Heparin subq Code Status: Full Family Communication: Pt in room, family not at bedside  Status is: Observation  The patient remains OBS appropriate and will d/c before 2 midnights.  Dispo: The patient is from: Home              Anticipated d/c is to: Home              Anticipated d/c date is: 2 days              Patient currently is not medically stable to d/c.   Consultants:   Oncology  General Surgery  Discussed case with Neurology  Procedures:     Antimicrobials: Anti-infectives (From admission, onward)   Start     Dose/Rate Route Frequency Ordered Stop   01/11/21 0815  ceFEPIme (MAXIPIME) 2 g in sodium chloride 0.9 % 100 mL IVPB        2 g 200 mL/hr over 30 Minutes Intravenous  Once 01/11/21 0802 01/11/21 0914       Subjective: Reports feeling better today  Objective: Vitals:   01/12/21 0052 01/12/21 0540 01/12/21 0730 01/12/21 1210  BP: 102/78 111/86 126/82 116/82  Pulse: 80 90 88 94  Resp: 18 18 17 18   Temp: 97.7 F (36.5 C) (!) 97.4 F (36.3 C) 98.2 F (36.8 C) 98.1 F (36.7 C)  TempSrc: Oral Oral    SpO2: 97% 98% 99% 100%  Weight:      Height:        Intake/Output Summary (Last 24 hours) at 01/12/2021 1532 Last data filed at 01/12/2021 1438 Gross per 24 hour  Intake 554.22 ml  Output 2400 ml  Net -1845.78 ml   Filed Weights   01/11/21 0614  Weight: 72.6 kg    Examination:  General exam: Appears calm and comfortable  Respiratory system: Clear to auscultation. Respiratory effort normal. Cardiovascular  system: S1 & S2 heard, Regular Gastrointestinal system: Abdomen is nondistended, soft and nontender. No organomegaly or masses felt. Normal bowel sounds heard. Central nervous system: Alert and oriented. No focal neurological deficits. Extremities: Symmetric 5 x 5 power. Skin: No rashes, lesions  Psychiatry: Judgement and insight appear normal. Mood & affect appropriate.   Data Reviewed: I have personally reviewed following labs and imaging studies  CBC: Recent Labs  Lab 01/06/21 1428 01/06/21 1434 01/07/21 0524 01/11/21 0659 01/12/21 0145  WBC 14.4*  --  7.7 15.6* 10.0  NEUTROABS 10.7*  --   --  13.2*  --   HGB 13.0 14.3 11.2* 12.2* 11.0*  HCT 40.3 42.0 33.5* 37.2* 34.2*  MCV 91.4  --  89.6 90.7 91.4  PLT 363  --  259 285 433   Basic Metabolic Panel: Recent Labs  Lab 01/06/21 1428 01/06/21 1434 01/07/21 0524 01/08/21 0144 01/11/21 0659 01/11/21 1549  01/12/21 0145  NA 135   < > 139 139 136 137 135  K 2.5*   < > 3.2* 3.5 2.7* 3.7 3.9  CL 97*   < > 101 104 96* 101 102  CO2 16*  --  26 26 26 25 25   GLUCOSE 223*   < > 138* 112* 117* 108* 120*  BUN <5*   < > <5* <5* 8 8 8   CREATININE 0.84   < > 0.53* 0.54* 0.45* 0.50* 0.43*  CALCIUM 8.7*  --  8.4* 8.2* 8.3* 8.5* 8.4*  MG 1.5*  --   --  2.0 1.9  --   --    < > = values in this interval not displayed.   GFR: Estimated Creatinine Clearance: 103.4 mL/min (A) (by C-G formula based on SCr of 0.43 mg/dL (L)). Liver Function Tests: Recent Labs  Lab 01/06/21 1428 01/07/21 0524 01/11/21 0659  AST 36 24 23  ALT 23 19 23   ALKPHOS 189* 157* 147*  BILITOT 0.8 0.8 1.1  PROT 6.2* 5.8* 6.4*  ALBUMIN 2.5* 2.2* 2.6*   No results for input(s): LIPASE, AMYLASE in the last 168 hours. No results for input(s): AMMONIA in the last 168 hours. Coagulation Profile: Recent Labs  Lab 01/06/21 1428 01/07/21 0524 01/11/21 0659  INR 1.2 1.2 1.2   Cardiac Enzymes: No results for input(s): CKTOTAL, CKMB, CKMBINDEX, TROPONINI in the  last 168 hours. BNP (last 3 results) No results for input(s): PROBNP in the last 8760 hours. HbA1C: No results for input(s): HGBA1C in the last 72 hours. CBG: Recent Labs  Lab 01/08/21 1618 01/11/21 1125 01/11/21 1553 01/12/21 0731 01/12/21 1211  GLUCAP 128* 89 98 107* 131*   Lipid Profile: No results for input(s): CHOL, HDL, LDLCALC, TRIG, CHOLHDL, LDLDIRECT in the last 72 hours. Thyroid Function Tests: No results for input(s): TSH, T4TOTAL, FREET4, T3FREE, THYROIDAB in the last 72 hours. Anemia Panel: No results for input(s): VITAMINB12, FOLATE, FERRITIN, TIBC, IRON, RETICCTPCT in the last 72 hours. Sepsis Labs: Recent Labs  Lab 01/11/21 0659  PROCALCITON <0.10  LATICACIDVEN 1.5    Recent Results (from the past 240 hour(s))  SARS CORONAVIRUS 2 (TAT 6-24 HRS) Nasopharyngeal Nasopharyngeal Swab     Status: None   Collection Time: 01/06/21  3:18 PM   Specimen: Nasopharyngeal Swab  Result Value Ref Range Status   SARS Coronavirus 2 NEGATIVE NEGATIVE Final    Comment: (NOTE) SARS-CoV-2 target nucleic acids are NOT DETECTED.  The SARS-CoV-2 RNA is generally detectable in upper and lower respiratory specimens during the acute phase of infection. Negative results do not preclude SARS-CoV-2 infection, do not rule out co-infections with other pathogens, and should not be used as the sole basis for treatment or other patient management decisions. Negative results must be combined with clinical observations, patient history, and epidemiological information. The expected result is Negative.  Fact Sheet for Patients: SugarRoll.be  Fact Sheet for Healthcare Providers: https://www.woods-mathews.com/  This test is not yet approved or cleared by the [place] FDA and  has been authorized for detection and/or diagnosis of SARS-CoV-2 by FDA under an Emergency Use Authorization (EUA). This EUA will remain  in effect (meaning this test  can be used) for the duration of the COVID-19 declaration under Se ction 564(b)(1) of the Act, 21 U.S.C. section 360bbb-3(b)(1), unless the authorization is terminated or revoked sooner.  Performed at [facility], [place] [address], [place], [place] [address]   Resp Panel by RT-PCR (Flu A&B, Covid)  Nasopharyngeal Swab     Status: None   Collection Time: 01/11/21  7:32 AM   Specimen: Nasopharyngeal Swab; Nasopharyngeal(NP) swabs in vial transport medium  Result Value Ref Range Status   SARS Coronavirus 2 by RT PCR NEGATIVE NEGATIVE Final    Comment: (NOTE) SARS-CoV-2 target nucleic acids are NOT DETECTED.  The SARS-CoV-2 RNA is generally detectable in upper respiratory specimens during the acute phase of infection. The lowest concentration of SARS-CoV-2 viral copies this assay can detect is 138 copies/mL. A negative result does not preclude SARS-Cov-2 infection and should not be used as the sole basis for treatment or other patient management decisions. A negative result may occur with  improper specimen collection/handling, submission of specimen other than nasopharyngeal swab, presence of viral mutation(s) within the areas targeted by this assay, and inadequate number of viral copies(<138 copies/mL). A negative result must be combined with clinical observations, patient history, and epidemiological information. The expected result is Negative.  Fact Sheet for Patients:  EntrepreneurPulse.com.au  Fact Sheet for Healthcare Providers:  IncredibleEmployment.be  This test is no t yet approved or cleared by the [place] FDA and  has been authorized for detection and/or diagnosis of SARS-CoV-2 by FDA under an Emergency Use Authorization (EUA). This EUA will remain  in effect (meaning this test can be used) for the duration of the COVID-19 declaration under Section 564(b)(1) of the Act, 21 U.S.C.section 360bbb-3(b)(1), unless the  authorization is terminated  or revoked sooner.       Influenza A by PCR NEGATIVE NEGATIVE Final   Influenza B by PCR NEGATIVE NEGATIVE Final    Comment: (NOTE) The Xpert Xpress SARS-CoV-2/FLU/RSV plus assay is intended as an aid in the diagnosis of influenza from Nasopharyngeal swab specimens and should not be used as a sole basis for treatment. Nasal washings and aspirates are unacceptable for Xpert Xpress SARS-CoV-2/FLU/RSV testing.  Fact Sheet for Patients: EntrepreneurPulse.com.au  Fact Sheet for Healthcare Providers: IncredibleEmployment.be  This test is not yet approved or cleared by the [place] FDA and has been authorized for detection and/or diagnosis of SARS-CoV-2 by FDA under an Emergency Use Authorization (EUA). This EUA will remain in effect (meaning this test can be used) for the duration of the COVID-19 declaration under Section 564(b)(1) of the Act, 21 U.S.C. section 360bbb-3(b)(1), unless the authorization is terminated or revoked.  Performed at [facility], [address] [place], [place] [address]   Blood culture (routine x 2)     Status: None (Preliminary result)   Collection Time: 01/11/21  7:57 AM   Specimen: BLOOD  Result Value Ref Range Status   Specimen Description BLOOD BLOOD RIGHT ARM  Final   Special Requests   Final    BOTTLES DRAWN AEROBIC AND ANAEROBIC Blood Culture adequate volume   Culture   Final    NO GROWTH < 24 HOURS Performed at [facility], [address], [place], [place] [address]    Report Status PENDING  Incomplete  Blood culture (routine x 2)     Status: None (Preliminary result)   Collection Time: 01/11/21  7:58 AM   Specimen: BLOOD  Result Value Ref Range Status   Specimen Description BLOOD BLOOD LEFT ARM  Final   Special Requests   Final    BOTTLES DRAWN AEROBIC AND ANAEROBIC Blood Culture adequate volume   Culture   Final    NO GROWTH < 24 HOURS Performed  at [facility], [address], [place], [place] [address]  Report Status PENDING  Incomplete     Radiology Studies: DG Chest 2 View  Result Date: 01/11/2021 CLINICAL DATA:  Tachycardia.  History of lung carcinoma EXAM: CHEST - 2 VIEW COMPARISON:  Chest radiograph August 18, 2020; chest CT November 08, 2020 FINDINGS: Port-A-Cath tip is in the superior vena cava. No pneumothorax. There is underlying emphysematous change with apparent bullous disease in the upper lobes. There is no edema or airspace opacity. The previously noted right lower lobe mass seen on most recent CT is not appreciable by radiography. Heart size is normal. Pulmonary vascularity reflects underlying emphysematous change. No adenopathy. No bone lesions. IMPRESSION: Port-A-Cath tip in superior vena cava. No pneumothorax. Underlying emphysematous change. No edema or airspace opacity. Right lower lobe mass noted on previous CT not appreciable by radiography. Heart size within normal limits. No adenopathy appreciable by radiography. Emphysema (ICD10-J43.9). Electronically Signed   By: Lowella Grip III M.D.   On: 01/11/2021 08:27    Scheduled Meds: . Chlorhexidine Gluconate Cloth  6 each Topical Daily  . dexamethasone  4 mg Oral Q6H  . dronabinol  5 mg Oral BID AC  . heparin  5,000 Units Subcutaneous Q8H  . insulin aspart  0-5 Units Subcutaneous QHS  . insulin aspart  0-9 Units Subcutaneous TID WC  . metoprolol succinate  12.5 mg Oral Daily  . vitamin B-12  1,000 mcg Oral Daily   Continuous Infusions: . levETIRAcetam 500 mg (01/12/21 0606)     LOS: 0 days   Marylu Lund, MD [facility] Pager On Amion  If 7PM-7AM, please contact night-coverage 01/12/2021, 3:32 PM

## 2021-01-12 NOTE — Progress Notes (Signed)
MRI images personally reviewed. The degree of edema from the focal lesions does not seem to pose a countervailing risk regarding anesthesia. Although a patient who has had seizure, brain metastases and his other comorbidities including CHF would be at increased risk for perioperative complications, the benefits of G-tube placement for improvement in nutrition most likely outweighs the risks. Discussed with Dr. Wyline Copas, who reviewed the clinical and imaging findings with me.   Electronically signed: Dr. Kerney Elbe

## 2021-01-12 NOTE — Consult Note (Signed)
Patient ID: Tyler Gallagher, male   DOB: 16-May-1961, 60 y.o.   MRN: [id number]  HPI Tyler Gallagher is a 61 y.o. male asked to see in consultation by [name] and Dr. Blaine Hamper for further discussion of robotic gastrostomy tube.  He was already on the schedule for gastrostomy tube early Tuesday but had a recent hospitalization requiring admission due to encephalopathy and altered mental status.  I had discussed with [name] in detail and we wanted to hold off on surgical intervention until he will be is stable from neurological standpoint of view.  He does have a history of lung cancer with metastatic disease to the brain and the liver.  He has significant malnutrition with significant weight loss for over 100 pounds over the last 7 months or so.  He has received chemotherapy as well as to brain radiation therapy and brain metastectomy.  He was admitted for seizure disorder.  Since he was hospitalized placed on anticonvulsives on hydration he has had significant improvement over the last 48 hours.  Currently he has no complaints.  I have seen him and examined him and he is awake and alert and he is able to protect his airway.  Now that he had some p.o. intake today he feels stronger.  Always ask again for consideration of robotic gastrostomy tube.  I have discussed with Dr.Chiu about the situation in detail.  Neurology has assessed his imaging studies and they considered that general anesthetic poses no major significant additional risk.  We all understand that he is high risk for developing any surgical complication given his metastatic disease and he is fully aware of the situation and is still wishes to proceed with G-tube placement I have personally reviewed his recent MRI from last week showing some brain edema related to metastatic disease. Chest x-ray without any acute intra-abdominal abnormalities. CBC show hemoglobin of 11 normal platelet count, BMP is normal, INR is normal   HPI  Past Medical History:   Diagnosis Date  . Depression   . Diabetes mellitus without complication ([place])   . Hyperlipidemia   . Hypertension   . Lesion of brain   . Lung cancer ([place])   . Mass of lung   . Seizures ([place])     Past Surgical History:  Procedure Laterality Date  . 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 COLONOSCOPY    . CYSTOSCOPY W/ RETROGRADES Bilateral 07/14/2020   Procedure: CYSTOSCOPY WITH RETROGRADE PYELOGRAM;  Surgeon: Hollice Espy, MD;  Location: [facility] ORS;  Service: Urology;  Laterality: Bilateral;  . PORTA CATH INSERTION N/A 08/03/2020   Procedure: PORTA CATH INSERTION;  Surgeon: Katha Cabal, MD;  Location: [place] [facility];  Service: Cardiovascular;  Laterality: N/A;  . PORTA CATH INSERTION N/A 10/03/2020   Procedure: PORTA CATH INSERTION;  Surgeon: Algernon Huxley, MD;  Location: [place] [facility];  Service: Cardiovascular;  Laterality: N/A;  . VIDEO BRONCHOSCOPY WITH ENDOBRONCHIAL NAVIGATION N/A 07/01/2020   Procedure: VIDEO BRONCHOSCOPY WITH ENDOBRONCHIAL NAVIGATION;  Surgeon: [name], MD;  Location: [facility] ORS;  Service: Thoracic;  Laterality: N/A;  . VIDEO BRONCHOSCOPY WITH ENDOBRONCHIAL ULTRASOUND N/A 07/01/2020   Procedure: VIDEO BRONCHOSCOPY WITH ENDOBRONCHIAL ULTRASOUND;  Surgeon: [name], MD;  Location: [facility] ORS;  Service: Thoracic;  Laterality: N/A;    Family History  Problem Relation Age of Onset  . Diabetes Sister   . Diabetes Brother     Social History Social History   Tobacco Use  . Smoking  status: Former Smoker    Packs/day: 2.00    Years: 32.00    Pack years: 64.00    Types: Cigarettes    Quit date: 06/21/2020    Years since quitting: 0.5  . Smokeless tobacco: Never Used  Vaping Use  . Vaping Use: Never used  Substance Use Topics  . Alcohol use: No    Alcohol/week: 0.0 standard drinks  . Drug use: Not Currently    Types: Marijuana    Allergies  Allergen Reactions  . Penicillins Other (See Comments)    "everything felt distant"     Current Facility-Administered Medications  Medication Dose Route Frequency Provider Last Rate Last Admin  . acetaminophen (TYLENOL) suppository 650 mg  650 mg Rectal Q6H PRN Ivor Costa, MD      . Chlorhexidine Gluconate Cloth 2 % PADS 6 each  6 each Topical Daily Ivor Costa, MD      . dexamethasone (DECADRON) tablet 4 mg  4 mg Oral Q6H Ivor Costa, MD   4 mg at 01/12/21 1245  . dronabinol (MARINOL) capsule 5 mg  5 mg Oral BID AC Ivor Costa, MD   5 mg at 01/12/21 1010  . heparin injection 5,000 Units  5,000 Units Subcutaneous Q8H Ivor Costa, MD   5,000 Units at 01/12/21 1445  . hydrALAZINE (APRESOLINE) injection 5 mg  5 mg Intravenous Q2H PRN Ivor Costa, MD      . insulin aspart (novoLOG) injection 0-5 Units  0-5 Units Subcutaneous QHS Ivor Costa, MD      . insulin aspart (novoLOG) injection 0-9 Units  0-9 Units Subcutaneous TID WC Ivor Costa, MD   1 Units at 01/12/21 1246  . levETIRAcetam (KEPPRA) IVPB 500 mg/100 mL premix  500 mg Intravenous Q12H Ivor Costa, MD 400 mL/hr at 01/12/21 0606 500 mg at 01/12/21 0606  . LORazepam (ATIVAN) injection 2 mg  2 mg Intravenous Q2H PRN Ivor Costa, MD      . metoprolol succinate (TOPROL-XL) 24 hr tablet 12.5 mg  12.5 mg Oral Daily Ivor Costa, MD   12.5 mg at 01/12/21 1011  . ondansetron (ZOFRAN) injection 4 mg  4 mg Intravenous Q8H PRN Ivor Costa, MD      . vitamin B-12 (CYANOCOBALAMIN) tablet 1,000 mcg  1,000 mcg Oral Daily Ivor Costa, MD   1,000 mcg at 01/12/21 1011   Facility-Administered Medications Ordered in Other Encounters  Medication Dose Route Frequency Provider Last Rate Last Admin  . heparin lock flush 100 unit/mL  500 Units Intravenous Once Charlaine Dalton R, MD      . sodium chloride flush (NS) 0.9 % injection 10 mL  10 mL Intravenous Once Cammie Sickle, MD         Review of Systems Full ROS  was asked and was negative except for the information on the HPI  Physical Exam Blood pressure 116/82, pulse 94, temperature 98.1  F (36.7 C), resp. rate 18, height 6\' 3"  (1.905 m), weight 72.6 kg, SpO2 100 %. CONSTITUTIONAL: NAD competent EYES: Pupils are equal, round,  Sclera are non-icteric. EARS, NOSE, MOUTH AND THROAT:  he is wearing a mask hearing is intact to voice. LYMPH NODES:  Lymph nodes in the neck are normal. RESPIRATORY:  Lungs are clear. There is normal respiratory effort, with equal breath sounds bilaterally, and without pathologic use of accessory muscles. CARDIOVASCULAR: Heart is regular without murmurs, gallops, or rubs. GI: The abdomen is soft, nontender, and nondistended. There are no palpable masses. There is no hepatosplenomegaly.  There are normal bowel sounds in all quadrants. GU: Rectal deferred.   MUSCULOSKELETAL: Normal muscle strength and tone. No cyanosis or edema.   SKIN: Turgor is good and there are no pathologic skin lesions or ulcers. NEUROLOGIC: Motor and sensation is grossly normal. Cranial nerves are grossly intact.  He is awake and alert and oriented without focal symptoms PSYCH:  Oriented to person, place and time. Affect is normal.  Data Reviewed  I have personally reviewed the patient's imaging, laboratory findings and medical records.    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  Discussed with the patient in detail about my thought process.  Unfortunately he keeps losing weight and getting dehydrated.  I do not disagree for the need for gastrostomy tube.  Neurology has evaluated him and considers that general anesthetic will not be contraindicated during this set up. He wishes to proceed with robotic G-tube.  Procedure discussed with the patient in detail.  Risks, benefits and possible indications including but not limited to: Bleeding, infection injury to adjacent organs.  He understands and wished to proceed.  I have taken informed consent and have answered  all the questions.  I have also had a discussion with [name] from oncology and Dr. Wyline Copas from hospitalist service. Plan for robotic g tube in am  Caroleen Hamman, MD FACS General Surgeon 01/12/2021, 3:42 PM

## 2021-01-12 NOTE — Care Management Obs Status (Signed)
[place] NOTIFICATION   Patient Details  Name: FRANKE MENTER MRN: [id number] Date of Birth: [birth year]   Medicare Observation Status Notification Given:  Yes    Candie Chroman, LCSW 01/12/2021, 4:06 PM

## 2021-01-12 NOTE — Progress Notes (Signed)
Initial Nutrition Assessment  DOCUMENTATION CODES:   Severe malnutrition in context of chronic illness  INTERVENTION:  Once G-tube placed and ready to use recommend slow initiation of bolus regimen as patient is at risk for refeeding syndrome: -Day 1: Provide Osmolite 1.5 Cal 1/2 carton (~120 mL) 4 times daily per tube -Day 2: Advance to Osmolite 1.5 Cal 1 carton (237 mL) 4 times daily per tube -Day 3: Advance to Osmolite 1.5 Cal 1 carton (237 mL) 6 times daily per tube -Day 4: Advance to goal regimen of Osmolite 1.5 Cal 1 carton (237 mL) 8 times daily per tube. Provides 2840 kcal, 119 grams of protein, 1448 mL H2O daily.  Flush tube with 30 mL before and after each bolus feeding. Provides a total of 1928 mL H2O daily including water in tube feeding. Patient also drinking liquids by mouth.  Monitor magnesium, potassium, and phosphorus daily until patient is at goal TF regimen and electrolytes are stable, MD to replete as needed, as pt is at risk for refeeding syndrome given severe malnutrition, poor PO intake.  NUTRITION DIAGNOSIS:   Severe Malnutrition related to chronic illness (stage IV lung cancer with brain mets, CHF) as evidenced by severe fat depletion,severe muscle depletion,energy intake < or equal to 75% for > or equal to 1 month.  GOAL:   Patient will meet greater than or equal to 90% of their needs  MONITOR:   PO intake,Labs,Weight trends,TF tolerance,I & O's  REASON FOR ASSESSMENT:   Malnutrition Screening Tool,Consult Assessment of nutrition requirement/status  ASSESSMENT:   60 year old male with PMHx of HTN, DM, HLD, CHF, depression, seizures, hx left AKA stage IV lung cancer s/p chemotherapy, resection of brain mass 7/021, whole brain radiation currenty on Keytruda recently admitted to [facility] 1/7-1/10 with seizure-like activity related to new brain metastases now admitted with seizures.   Met with patient and his significant other at bedside. Patient reports  he has had a poor appetite and has not been able to eat well for at least 3 months now. He reports he eats no food during the day typically. He will occasionally eat some bites of chicken noodle soup. He does not tolerate "milky" supplements very well. He can drink the Ensure Clear blueberry pomegranate and drinks up to 5 of those per day. Even if patient drinks 5 of these that is only 900 kcal (38% minimum estimated kcal needs) and 40 grams of protein (33% minimum estimated protein needs). Plan was for G-tube placement today by surgery but this has been postponed for now. Patient was placed on regular diet. He was able to eat a small amount of breakfast this morning (<25%). Patient and significant other are wondering how long G-tube is going to be postponed. If it will be a while patient is interested in placement of small-bore feeding tube (DHT) for initiation of tube feeds in the meantime.   Patient reports his UBW was 280 lbs and he has lost a significant amount of weight over time. He is now 155-160 lbs per his report. RD unable to check weight today as patient on low bed and scale was not accurate. Per review of chart patient was documented to be 260 lbs on 06/21/2020. He has lost approximately 100 lbs (38.5% body weight) over the past 7 months, which is significant for time frame. Per chart patient was possibly 175 lbs on 1/7, so unsure of exact weight trend.  Discussed with patient's outpatient oncology RD from cancer center. Patient set  up to receive tube feeds and tube feed supplies from [facility] once G-tube is in place. Plan was originally for goal TF regimen of Isosource 1.5 Cal 7 cans per day (2625 kcal, 119 grams of protein, 1337 mL H2O) but this product is not on formulary here.  Medications reviewed and include: Decadron 4 mg Q6hrs, Marinol 5 mg BID, Novolog 0-9 units TID, Novolog 0-5 units QHS, vitamin B12 1000 micrograms daily, Keppra.  Labs reviewed: CBG 98-131, Creatinine 0.43.  Discussed  with Attending, Surgeon, Oncologist, and RN in secure chat. Neurology has cleared patient for G-tube placement. Plan is for placement tomorrow.  NUTRITION - FOCUSED PHYSICAL EXAM:  Flowsheet Row Most Recent Value  Orbital Region Severe depletion  Upper Arm Region Severe depletion  Thoracic and Lumbar Region Moderate depletion  Buccal Region Severe depletion  Temple Region Moderate depletion  Clavicle Bone Region Severe depletion  Clavicle and Acromion Bone Region Severe depletion  Scapular Bone Region Moderate depletion  Dorsal Hand Severe depletion  Patellar Region Severe depletion  [assessed right lower extremity,  pt s/p left AKA]  Anterior Thigh Region Severe depletion  [assessed right lower extremity,  pt s/p left AKA]  Posterior Calf Region Severe depletion  [assessed right lower extremity,  pt s/p left AKA]  Edema (RD Assessment) None  Hair Reviewed  Eyes Reviewed  Mouth Reviewed  Skin Reviewed  Nails Reviewed     Diet Order:   Diet Order            Diet NPO time specified  Diet effective midnight           Diet regular Room service appropriate? Yes; Fluid consistency: Thin  Diet effective now                EDUCATION NEEDS:   No education needs have been identified at this time  Skin:  Skin Assessment: Reviewed RN Assessment  Last BM:  01/12/2020 per chart  Height:   Ht Readings from Last 1 Encounters:  01/11/21 '6\' 3"'  (1.905 m)   Weight:   Wt Readings from Last 1 Encounters:  01/11/21 72.6 kg   BMI:  Body mass index is 20 kg/m.  Estimated Nutritional Needs:   Kcal:  5883-2549  Protein:  120-140 grams  Fluid:  >/= 2 L/day  Jacklynn Barnacle, MS, RD, LDN Pager number available on Amion

## 2021-01-13 ENCOUNTER — Encounter
Admission: EM | Disposition: A | Discharge status: Home / Self Care | Payer: Self-pay | Source: Home / Self Care | Attending: Internal Medicine

## 2021-01-13 ENCOUNTER — Observation Stay: Payer: Medicare HMO | Admitting: Anesthesiology

## 2021-01-13 ENCOUNTER — Other Ambulatory Visit: Payer: Self-pay | Admitting: Nurse Practitioner

## 2021-01-13 ENCOUNTER — Encounter: Payer: Self-pay | Admitting: Internal Medicine

## 2021-01-13 DIAGNOSIS — C3490 Malignant neoplasm of unspecified part of unspecified bronchus or lung: Secondary | ICD-10-CM | POA: Diagnosis not present

## 2021-01-13 DIAGNOSIS — E785 Hyperlipidemia, unspecified: Secondary | ICD-10-CM | POA: Diagnosis present

## 2021-01-13 DIAGNOSIS — E860 Dehydration: Secondary | ICD-10-CM | POA: Diagnosis not present

## 2021-01-13 DIAGNOSIS — Z89612 Acquired absence of left leg above knee: Secondary | ICD-10-CM | POA: Diagnosis not present

## 2021-01-13 DIAGNOSIS — Z79899 Other long term (current) drug therapy: Secondary | ICD-10-CM | POA: Diagnosis not present

## 2021-01-13 DIAGNOSIS — Z9221 Personal history of antineoplastic chemotherapy: Secondary | ICD-10-CM | POA: Diagnosis not present

## 2021-01-13 DIAGNOSIS — F32A Depression, unspecified: Secondary | ICD-10-CM | POA: Diagnosis present

## 2021-01-13 DIAGNOSIS — R531 Weakness: Secondary | ICD-10-CM | POA: Diagnosis not present

## 2021-01-13 DIAGNOSIS — C3431 Malignant neoplasm of lower lobe, right bronchus or lung: Secondary | ICD-10-CM | POA: Diagnosis present

## 2021-01-13 DIAGNOSIS — C787 Secondary malignant neoplasm of liver and intrahepatic bile duct: Secondary | ICD-10-CM | POA: Diagnosis present

## 2021-01-13 DIAGNOSIS — E46 Unspecified protein-calorie malnutrition: Secondary | ICD-10-CM | POA: Diagnosis not present

## 2021-01-13 DIAGNOSIS — Z431 Encounter for attention to gastrostomy: Secondary | ICD-10-CM | POA: Diagnosis not present

## 2021-01-13 DIAGNOSIS — Z7984 Long term (current) use of oral hypoglycemic drugs: Secondary | ICD-10-CM | POA: Diagnosis not present

## 2021-01-13 DIAGNOSIS — D72829 Elevated white blood cell count, unspecified: Secondary | ICD-10-CM | POA: Diagnosis present

## 2021-01-13 DIAGNOSIS — Z880 Allergy status to penicillin: Secondary | ICD-10-CM | POA: Diagnosis not present

## 2021-01-13 DIAGNOSIS — Z20822 Contact with and (suspected) exposure to covid-19: Secondary | ICD-10-CM | POA: Diagnosis present

## 2021-01-13 DIAGNOSIS — C7931 Secondary malignant neoplasm of brain: Secondary | ICD-10-CM | POA: Diagnosis present

## 2021-01-13 DIAGNOSIS — I5022 Chronic systolic (congestive) heart failure: Secondary | ICD-10-CM | POA: Diagnosis present

## 2021-01-13 DIAGNOSIS — E119 Type 2 diabetes mellitus without complications: Secondary | ICD-10-CM | POA: Diagnosis present

## 2021-01-13 DIAGNOSIS — Z9114 Patient's other noncompliance with medication regimen: Secondary | ICD-10-CM | POA: Diagnosis not present

## 2021-01-13 DIAGNOSIS — R569 Unspecified convulsions: Secondary | ICD-10-CM | POA: Diagnosis present

## 2021-01-13 DIAGNOSIS — R4182 Altered mental status, unspecified: Secondary | ICD-10-CM | POA: Diagnosis not present

## 2021-01-13 DIAGNOSIS — E876 Hypokalemia: Secondary | ICD-10-CM | POA: Diagnosis present

## 2021-01-13 DIAGNOSIS — Z8673 Personal history of transient ischemic attack (TIA), and cerebral infarction without residual deficits: Secondary | ICD-10-CM | POA: Diagnosis not present

## 2021-01-13 DIAGNOSIS — I429 Cardiomyopathy, unspecified: Secondary | ICD-10-CM | POA: Diagnosis present

## 2021-01-13 DIAGNOSIS — C7989 Secondary malignant neoplasm of other specified sites: Secondary | ICD-10-CM | POA: Diagnosis present

## 2021-01-13 DIAGNOSIS — I110 Hypertensive heart disease with heart failure: Secondary | ICD-10-CM | POA: Diagnosis present

## 2021-01-13 DIAGNOSIS — G40909 Epilepsy, unspecified, not intractable, without status epilepticus: Secondary | ICD-10-CM | POA: Diagnosis present

## 2021-01-13 DIAGNOSIS — Z923 Personal history of irradiation: Secondary | ICD-10-CM | POA: Diagnosis not present

## 2021-01-13 DIAGNOSIS — E43 Unspecified severe protein-calorie malnutrition: Secondary | ICD-10-CM | POA: Diagnosis present

## 2021-01-13 DIAGNOSIS — G936 Cerebral edema: Secondary | ICD-10-CM | POA: Diagnosis present

## 2021-01-13 LAB — GLUCOSE, CAPILLARY
Glucose-Capillary: 110 mg/dL — ABNORMAL HIGH (ref 70–99)
Glucose-Capillary: 129 mg/dL — ABNORMAL HIGH (ref 70–99)
Glucose-Capillary: 104 mg/dL — ABNORMAL HIGH (ref 70–99)
Glucose-Capillary: 160 mg/dL — ABNORMAL HIGH (ref 70–99)
Glucose-Capillary: 117 mg/dL — ABNORMAL HIGH (ref 70–99)

## 2021-01-13 SURGERY — INSERTION, GASTROSTOMY TUBE, ROBOT-ASSISTED
Anesthesia: General

## 2021-01-13 MED ORDER — BUPIVACAINE-EPINEPHRINE 0.25% -1:200000 IJ SOLN
INTRAMUSCULAR | Status: DC | PRN
  Administered 2021-01-13: 30 mL

## 2021-01-13 MED ORDER — ORAL CARE MOUTH RINSE
15.0000 mL | Freq: Once | OROMUCOSAL | Status: AC
  Administered 2021-01-13: 15 mL via OROMUCOSAL

## 2021-01-13 MED ORDER — FENTANYL CITRATE (PF) 100 MCG/2ML IJ SOLN
INTRAMUSCULAR | Status: AC
  Filled 2021-01-13: qty 2

## 2021-01-13 MED ORDER — FENTANYL CITRATE (PF) 100 MCG/2ML IJ SOLN
25.0000 ug | INTRAMUSCULAR | Status: DC | PRN
  Administered 2021-01-13: 25 ug via INTRAVENOUS

## 2021-01-13 MED ORDER — DEXAMETHASONE SODIUM PHOSPHATE 10 MG/ML IJ SOLN
INTRAMUSCULAR | Status: DC | PRN
  Administered 2021-01-13: 10 mg via INTRAVENOUS

## 2021-01-13 MED ORDER — SUGAMMADEX SODIUM 200 MG/2ML IV SOLN
INTRAVENOUS | Status: DC | PRN
  Administered 2021-01-13: 290.4 mg via INTRAVENOUS

## 2021-01-13 MED ORDER — CHLORHEXIDINE GLUCONATE CLOTH 2 % EX PADS: 6.0000 | MEDICATED_PAD | Freq: Once | CUTANEOUS | Status: AC

## 2021-01-13 MED ORDER — OXYCODONE HCL 5 MG PO TABS: 5.0000 mg | ORAL_TABLET | ORAL | Status: DC | PRN

## 2021-01-13 MED ORDER — ONDANSETRON HCL 4 MG/2ML IJ SOLN
INTRAMUSCULAR | Status: DC | PRN
  Administered 2021-01-13: 4 mg via INTRAVENOUS

## 2021-01-13 MED ORDER — OSMOLITE 1.5 CAL PO LIQD
237.0000 mL | Freq: Four times a day (QID) | ORAL | Status: DC
  Administered 2021-01-15 (×3): 237 mL

## 2021-01-13 MED ORDER — SODIUM CHLORIDE 0.9 % IV SOLN: INTRAVENOUS | Status: DC

## 2021-01-13 MED ORDER — ROCURONIUM BROMIDE 100 MG/10ML IV SOLN
INTRAVENOUS | Status: DC | PRN
  Administered 2021-01-13 (×2): 50 mg via INTRAVENOUS

## 2021-01-13 MED ORDER — OSMOLITE 1.5 CAL PO LIQD
237.0000 mL | Freq: Every day | ORAL | Status: AC
  Administered 2021-01-16 (×4): 237 mL

## 2021-01-13 MED ORDER — ACETAMINOPHEN 10 MG/ML IV SOLN
INTRAVENOUS | Status: DC | PRN
  Administered 2021-01-13: 1000 mg via INTRAVENOUS

## 2021-01-13 MED ORDER — DEXMEDETOMIDINE (PRECEDEX) IN NS 20 MCG/5ML (4 MCG/ML) IV SYRINGE
PREFILLED_SYRINGE | INTRAVENOUS | Status: DC | PRN
  Administered 2021-01-13: 20 ug via INTRAVENOUS

## 2021-01-13 MED ORDER — CHLORHEXIDINE GLUCONATE CLOTH 2 % EX PADS
6.0000 | MEDICATED_PAD | Freq: Once | CUTANEOUS | Status: AC
  Administered 2021-01-13: 6 via TOPICAL

## 2021-01-13 MED ORDER — FAMOTIDINE 20 MG PO TABS
20.0000 mg | ORAL_TABLET | Freq: Once | ORAL | Status: AC
  Administered 2021-01-13: 20 mg via ORAL
  Filled 2021-01-13: qty 1

## 2021-01-13 MED ORDER — GLYCOPYRROLATE 0.2 MG/ML IJ SOLN
INTRAMUSCULAR | Status: DC | PRN
  Administered 2021-01-13: .2 mg via INTRAVENOUS

## 2021-01-13 MED ORDER — CHLORHEXIDINE GLUCONATE 0.12 % MT SOLN: 15.0000 mL | Freq: Once | OROMUCOSAL | Status: AC

## 2021-01-13 MED ORDER — ACETAMINOPHEN 500 MG PO TABS: 1000.0000 mg | ORAL_TABLET | ORAL | Status: DC

## 2021-01-13 MED ORDER — OSMOLITE 1.2 CAL PO LIQD: 1000.0000 mL | ORAL | Status: DC

## 2021-01-13 MED ORDER — PROPOFOL 10 MG/ML IV BOLUS
INTRAVENOUS | Status: DC | PRN
  Administered 2021-01-13: 200 mg via INTRAVENOUS

## 2021-01-13 MED ORDER — ACETAMINOPHEN 500 MG PO TABS
1000.0000 mg | ORAL_TABLET | Freq: Four times a day (QID) | ORAL | Status: DC
  Administered 2021-01-13 (×2): 1000 mg via ORAL
  Filled 2021-01-13 (×3): qty 2

## 2021-01-13 MED ORDER — MIDAZOLAM HCL 2 MG/2ML IJ SOLN
INTRAMUSCULAR | Status: DC | PRN
  Administered 2021-01-13: 2 mg via INTRAVENOUS

## 2021-01-13 MED ORDER — OXYCODONE HCL 5 MG PO TABS: 5.0000 mg | ORAL_TABLET | Freq: Once | ORAL | Status: DC | PRN

## 2021-01-13 MED ORDER — SODIUM CHLORIDE 0.9 % IV SOLN
INTRAVENOUS | Status: AC
  Filled 2021-01-13: qty 2

## 2021-01-13 MED ORDER — GABAPENTIN 300 MG PO CAPS: 300.0000 mg | ORAL_CAPSULE | ORAL | Status: DC

## 2021-01-13 MED ORDER — BUPIVACAINE LIPOSOME 1.3 % IJ SUSP
INTRAMUSCULAR | Status: DC | PRN
  Administered 2021-01-13: 20 mL

## 2021-01-13 MED ORDER — SODIUM CHLORIDE 0.9 % IV SOLN
2.0000 g | INTRAVENOUS | Status: AC
  Administered 2021-01-13: 2 g via INTRAVENOUS
  Filled 2021-01-13: qty 2

## 2021-01-13 MED ORDER — SODIUM CHLORIDE 0.9 % IV SOLN: INTRAVENOUS | Status: DC | PRN

## 2021-01-13 MED ORDER — FENTANYL CITRATE (PF) 100 MCG/2ML IJ SOLN
INTRAMUSCULAR | Status: DC | PRN
  Administered 2021-01-13 (×2): 50 ug via INTRAVENOUS

## 2021-01-13 MED ORDER — MORPHINE SULFATE (PF) 4 MG/ML IV SOLN: 4.0000 mg | INTRAVENOUS | Status: DC | PRN

## 2021-01-13 MED ORDER — LIDOCAINE HCL (CARDIAC) PF 100 MG/5ML IV SOSY
PREFILLED_SYRINGE | INTRAVENOUS | Status: DC | PRN
  Administered 2021-01-13: 100 mg via INTRAVENOUS

## 2021-01-13 MED ORDER — CELECOXIB 200 MG PO CAPS: 200.0000 mg | ORAL_CAPSULE | ORAL | Status: DC

## 2021-01-13 MED ORDER — CIPROFLOXACIN IN D5W 400 MG/200ML IV SOLN: 400.0000 mg | INTRAVENOUS | Status: DC

## 2021-01-13 MED ORDER — PHENYLEPHRINE HCL (PRESSORS) 10 MG/ML IV SOLN
INTRAVENOUS | Status: DC | PRN
  Administered 2021-01-13 (×7): 200 ug via INTRAVENOUS

## 2021-01-13 MED ORDER — OSMOLITE 1.5 CAL PO LIQD
120.0000 mL | Freq: Four times a day (QID) | ORAL | Status: AC
  Administered 2021-01-14 (×4): 120 mL

## 2021-01-13 MED ORDER — SODIUM CHLORIDE 0.9 % IV SOLN: 2.0000 g | INTRAVENOUS | Status: DC

## 2021-01-13 MED ORDER — OXYCODONE HCL 5 MG/5ML PO SOLN: 5.0000 mg | Freq: Once | ORAL | Status: DC | PRN

## 2021-01-13 SURGICAL SUPPLY — 61 items
CANISTER SUCT 1200ML W/VALVE (MISCELLANEOUS) ×2 IMPLANT
CANNULA REDUC XI 12-8 STAPL (CANNULA) ×1
CANNULA REDUCER 12-8 DVNC XI (CANNULA) ×1 IMPLANT
CHLORAPREP W/TINT 26 (MISCELLANEOUS) ×2 IMPLANT
COVER TIP SHEARS 8 DVNC (MISCELLANEOUS) ×1 IMPLANT
COVER TIP SHEARS 8MM DA VINCI (MISCELLANEOUS) ×1
COVER WAND RF STERILE (DRAPES) ×4 IMPLANT
DEFOGGER SCOPE WARMER CLEARIFY (MISCELLANEOUS) ×2 IMPLANT
DERMABOND ADVANCED (GAUZE/BANDAGES/DRESSINGS) ×1
DERMABOND ADVANCED .7 DNX12 (GAUZE/BANDAGES/DRESSINGS) ×1 IMPLANT
DRAPE ARM DVNC X/XI (DISPOSABLE) ×3 IMPLANT
DRAPE COLUMN DVNC XI (DISPOSABLE) ×1 IMPLANT
DRAPE DA VINCI XI ARM (DISPOSABLE) ×3
DRAPE DA VINCI XI COLUMN (DISPOSABLE) ×1
ELECT CAUTERY BLADE 6.4 (BLADE) IMPLANT
ELECT REM PT RETURN 9FT ADLT (ELECTROSURGICAL) ×2
ELECTRODE REM PT RTRN 9FT ADLT (ELECTROSURGICAL) ×1 IMPLANT
GLOVE SURG ENC MOIS LTX SZ7 (GLOVE) ×4 IMPLANT
GOWN STRL REUS W/ TWL LRG LVL3 (GOWN DISPOSABLE) ×3 IMPLANT
GOWN STRL REUS W/TWL LRG LVL3 (GOWN DISPOSABLE) ×3
IRRIGATION STRYKERFLOW (MISCELLANEOUS) IMPLANT
IRRIGATOR STRYKERFLOW (MISCELLANEOUS)
KIT PINK PAD W/HEAD ARE REST (MISCELLANEOUS) ×2
KIT PINK PAD W/HEAD ARM REST (MISCELLANEOUS) ×1 IMPLANT
LABEL OR SOLS (LABEL) ×2 IMPLANT
MANIFOLD NEPTUNE II (INSTRUMENTS) ×2 IMPLANT
NEEDLE HYPO 22GX1.5 SAFETY (NEEDLE) ×2 IMPLANT
NEEDLE INSUFFLATION 14GA 120MM (NEEDLE) ×2 IMPLANT
OBTURATOR OPTICAL STANDARD 8MM (TROCAR) ×1
OBTURATOR OPTICAL STND 8 DVNC (TROCAR) ×1
OBTURATOR OPTICALSTD 8 DVNC (TROCAR) ×1 IMPLANT
PACK LAP CHOLECYSTECTOMY (MISCELLANEOUS) ×2 IMPLANT
PENCIL ELECTRO HAND CTR (MISCELLANEOUS) ×2 IMPLANT
SEAL CANN UNIV 5-8 DVNC XI (MISCELLANEOUS) ×3 IMPLANT
SEAL XI 5MM-8MM UNIVERSAL (MISCELLANEOUS) ×3
SEALER VESSEL DA VINCI XI (MISCELLANEOUS)
SEALER VESSEL EXT DVNC XI (MISCELLANEOUS) IMPLANT
SET TUBE SMOKE EVAC HIGH FLOW (TUBING) ×2 IMPLANT
SOLUTION ELECTROLUBE (MISCELLANEOUS) ×2 IMPLANT
SPONGE DRAIN TRACH 4X4 STRL 2S (GAUZE/BANDAGES/DRESSINGS) ×2 IMPLANT
SPONGE LAP 18X18 RF (DISPOSABLE) ×2 IMPLANT
SPONGE LAP 4X18 RFD (DISPOSABLE) ×2 IMPLANT
STAPLER CANNULA SEAL DVNC XI (STAPLE) ×1 IMPLANT
STAPLER CANNULA SEAL XI (STAPLE) ×1
SUT ETHILON 3-0 FS-10 30 BLK (SUTURE) ×2
SUT MNCRL 4-0 (SUTURE) ×1
SUT MNCRL 4-0 27XMFL (SUTURE) ×1
SUT V-LOC 90 ABS 3-0 VLT  V-20 (SUTURE) ×1
SUT V-LOC 90 ABS 3-0 VLT V-20 (SUTURE) ×1 IMPLANT
SUT VICRYL 0 AB UR-6 (SUTURE) ×4 IMPLANT
SUT VLOC 180 2-0 9IN GS21 (SUTURE) ×2 IMPLANT
SUT VLOC 90 S/L VL9 GS22 (SUTURE) IMPLANT
SUTURE EHLN 3-0 FS-10 30 BLK (SUTURE) ×1 IMPLANT
SUTURE MNCRL 4-0 27XMF (SUTURE) ×1 IMPLANT
SYR 20ML LL LF (SYRINGE) ×2 IMPLANT
SYR 30ML LL (SYRINGE) ×2 IMPLANT
TROCAR BALLN GELPORT 12X130M (ENDOMECHANICALS) ×2 IMPLANT
TUBE GASTRO 14F 5C (TUBING) IMPLANT
TUBE JEJUNO 16X14 (TUBING) ×2 IMPLANT
TUBE JEJUNO 18X14 (TUBING) ×5 IMPLANT
TUBE JEJUNO 22X14 (TUBING) IMPLANT

## 2021-01-13 NOTE — Transfer of Care (Signed)
Immediate Anesthesia Transfer of Care Note  Patient: Tyler Gallagher  Procedure(s) Performed: Procedure(s): XI ROBOT ASSISTED GASTROSTOMY TUBE PLACEMENT (N/A)  Patient Location: PACU  Anesthesia Type:General  Level of Consciousness: sedated  Airway & Oxygen Therapy: Patient Spontanous Breathing and Patient connected to face mask oxygen  Post-op Assessment: Report given to RN and Post -op Vital signs reviewed and stable  Post vital signs: Reviewed and stable  Last Vitals:  Vitals:   01/13/21 0903 01/13/21 1115  BP: 112/90 111/77  Pulse: 84   Resp: 14 18  Temp: 36.4 C (!) 36.3 C  SpO2: 098% 119%    Complications: No apparent anesthesia complications

## 2021-01-13 NOTE — Anesthesia Procedure Notes (Signed)
Procedure Name: Intubation Date/Time: 01/13/2021 9:39 AM Performed by: Doreen Salvage, CRNA Pre-anesthesia Checklist: Patient identified, Patient being monitored, Timeout performed, Emergency Drugs available and Suction available Patient Re-evaluated:Patient Re-evaluated prior to induction Oxygen Delivery Method: Circle system utilized Preoxygenation: Pre-oxygenation with 100% oxygen Induction Type: IV induction Ventilation: Mask ventilation without difficulty Laryngoscope Size: Mac, McGraph and 4 Grade View: Grade I Tube type: Oral Tube size: 7.5 mm Number of attempts: 1 Airway Equipment and Method: Stylet Placement Confirmation: ETT inserted through vocal cords under direct vision,  positive ETCO2 and breath sounds checked- equal and bilateral Secured at: 21 cm Tube secured with: Tape Dental Injury: Teeth and Oropharynx as per pre-operative assessment

## 2021-01-13 NOTE — Anesthesia Postprocedure Evaluation (Signed)
Anesthesia Post Note  Patient: Tyler Gallagher  Procedure(s) Performed: XI ROBOT ASSISTED GASTROSTOMY TUBE PLACEMENT (N/A )  Patient location during evaluation: PACU Anesthesia Type: General Level of consciousness: awake and alert Pain management: pain level controlled Vital Signs Assessment: post-procedure vital signs reviewed and stable Respiratory status: spontaneous breathing, nonlabored ventilation, respiratory function stable and patient connected to nasal cannula oxygen Cardiovascular status: blood pressure returned to baseline and stable Postop Assessment: no apparent nausea or vomiting Anesthetic complications: no   No complications documented.   Last Vitals:  Vitals:   01/13/21 1204 01/13/21 1217  BP: 91/70 (!) 84/65  Pulse: 73 74  Resp: 15 16  Temp: (!) 36.2 C 37 C  SpO2: 91% 94%    Last Pain:  Vitals:   01/13/21 1204  TempSrc:   PainSc: 3                  Precious Haws Shanard Treto

## 2021-01-13 NOTE — Discharge Instructions (Signed)
Tube Feeding Schedule for Tyler Gallagher  Tube Feeding Formula: Osmolite 1.5 Cal  Total cans recommended daily to meet nutritional needs: 8 cartons per day  8 Feedings per day: 8 cartons total per day  Suggested Schedule: Osmolite 1.5 Cal  6:00 am 237 ml or 8 ounces (1 carton)   Free water flush of 75ml before and after feeding    8:00 am 237 ml or 8 ounces (1 carton)    Free water flush of 5ml before and after feeding   10:00 am 237 ml or 8 ounces (1 carton)   Free water flush of 70ml before and after feeding   12:00 pm 237 ml or 8 ounces (1 carton)   Free water flush of 30 mL before and after feeding   2:00 pm 237 ml or 8 ounces (1 carton)  Free water flush of 30 mL before and after feeding   4:00 pm 237 ml or 8 ounces (1 carton)  Free water flush of 30 mL before and after feeding   6:00 pm 237 ml or 8 ounces (1 carton)  Free water flush of 30 mL before and after feeding   8:00 pm 237 ml or 8 ounces (1 carton) Free water flush of 30 mL before and after feeding    Do not lie flat during feeding; Remain upright (in chair or standing) for at least 45 minutes after each bolus feeding.     Flush with 30 mL of water before and after medication administration. Water flushes should be at room temperature.    Please wash hands before and after each feeding is administered.  Opened cartons of unused feeding can be covered and placed in refrigerator for use at next feeding    **If not tolerating Osmolite 1.5 Cal, recommend speaking with RD/MD regarding changing formula to something you can tolerate better

## 2021-01-13 NOTE — Op Note (Signed)
Robotic assisted laparoscopic gastrostomy tube   Pre-operative Diagnosis: malnutrition   Post-operative Diagnosis: same   Procedure:  Robotic assisted laparoscopic G tube 18 FR   Surgeon: Caroleen Hamman, MD FACS   Anesthesia: Gen. 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 The benefits, complications, treatment options, and expected outcomes were discussed with the Family. The risks of bleeding, infection, recurrence of symptoms, failure to resolve symptoms, bowel injury, any of which could require further surgery were reviewed .   The  family concurred with the proposed plan, giving informed consent.  The patient was taken to Operating Room, identified  and the procedure verified. A Time Out was held and the above information confirmed.   Prior to the induction of general anesthesia, antibiotic prophylaxis was administered. VTE prophylaxis was in place. General endotracheal anesthesia was then administered and tolerated well. After the induction, the abdomen was prepped with Chloraprep and draped in the sterile fashion. The patient was positioned in the supine position.   Veres needle was placed on Palmer's point confirming adequate placement with drop test and appropriate pressures. Pneumoperitoneum was then created with CO2 and tolerated well without any adverse changes in the patient's vital signs.  Two 8-mm ports were placed under direct vision.  Inspection revealed metastasis on the liver, I also felt metastatic nodule measuring 3 cms on left subcostal area. I grasped the body  stomach and tented toward the abdominal wall, small left subcostal incision created to placed Gastrostomy tube under direct visualization.     The patient was positioned  in  reverse Trendelenburg, robot was brought to the surgical field and docked in the standard fashion.  We made sure all the instrumentation was kept indirect view at all times and that there were no collision between the arms. I scrubbed out and went to the console. Using the scissors I performed a gastrotomy and confirmed that I was intraluminally. A partial pursestring suture was placed using 3 0 V-Loc suture around the G-tube in an inner fashion. I placed the G-tube through the gastrotomy in direct fashion.  I was able to get my scrub to flush saline via the G-tube confirming the proper position of the tube intraluminally.  The balloon was inflated and pulled back.  We then were able to tied the first pursestring in the standard fashion and completing our circumferential outer pursestring with the 2 V-Loc sutures Inspection of the  upper quadrant was performed. No bleeding, bile duct injury or leak, or bowel injury was noted.  All the needles were removed under direct visualization. Robotic instruments and robotic arms were undocked in the standard fashion.  I scrubbed back in. Liposomal Marcaine was used to infiltrate the abdominal wall at all incision sites   Pneumoperitoneum was released.  The periumbilical port site was closed with interrumpted 0 Vicryl sutures. 4-0 subcuticular Monocryl was used to close the skin. Dermabond was  applied.  The patient was then extubated and brought to the recovery room in stable condition. Sponge, lap, and needle counts were correct at closure and at the conclusion of the case.               Caroleen Hamman, MD, FACS

## 2021-01-13 NOTE — Anesthesia Preprocedure Evaluation (Signed)
Anesthesia Evaluation  Patient identified by MRN, date of birth, ID band Patient awake    Reviewed: Allergy & Precautions, NPO status , Patient's Chart, lab work & pertinent test results  History of Anesthesia Complications Negative for: history of anesthetic complications  Airway Mallampati: II  TM Distance: >3 FB Neck ROM: Full    Dental  (+) Edentulous Upper, Edentulous Lower, Dental Advisory Given   Pulmonary shortness of breath, neg sleep apnea, neg COPD, Current Smoker and Patient abstained from smoking., former smoker,  Recent 2-3 PPD smoker. Lung lesions with brain mets   breath sounds clear to auscultation + decreased breath sounds ( )      Cardiovascular Exercise Tolerance: Poor METShypertension, + Peripheral Vascular Disease and +CHF  (-) CAD and (-) Past MI (-) dysrhythmias  Rhythm:Regular Rate:Normal - Systolic murmurs    Neuro/Psych Seizures -,  TIAnegative psych ROS   GI/Hepatic neg GERD  ,(+)     (-) substance abuse  ,   Endo/Other  diabetes  Renal/GU negative Renal ROS     Musculoskeletal   Abdominal   Peds  Hematology   Anesthesia Other Findings Past Medical History: No date: Diabetes mellitus without complication (HCC) No date: Hyperlipidemia No date: Hypertension No date: Lesion of brain No date: Mass of lung  Reproductive/Obstetrics                             Anesthesia Physical  Anesthesia Plan  ASA: III  Anesthesia Plan: General   Post-op Pain Management:    Induction: Intravenous  PONV Risk Score and Plan: 1 and Ondansetron and Dexamethasone  Airway Management Planned: Oral ETT  Additional Equipment: None  Intra-op Plan:   Post-operative Plan: Extubation in OR  Informed Consent: I have reviewed the patients History and Physical, chart, labs and discussed the procedure including the risks, benefits and alternatives for the proposed anesthesia  with the patient or authorized representative who has indicated his/her understanding and acceptance.     Dental advisory given  Plan Discussed with: CRNA and Surgeon  Anesthesia Plan Comments: (Patient consented for risks of anesthesia including but not limited to:  - adverse reactions to medications - damage to eyes, teeth, lips or other oral mucosa - nerve damage due to positioning  - sore throat or hoarseness - Damage to heart, brain, nerves, lungs, other parts of body or loss of life  Patient voiced understanding.)        Anesthesia Quick Evaluation

## 2021-01-13 NOTE — Progress Notes (Addendum)
Patient getting feeding tube today. CSW informed by RD that staff at [facility] has already set patient up with [facility] who will be providing tube feed supplies at home. Prescriptions for tube feeds will need to be sent to [facility].  Asked MD if [facility] is indicated for new feeding tube, or if family/patient can manage tube feeds at home. Per MD, need more information on home set up. Patient also reported to MD he has issues obtaining medications. Will leave handoff for weekend TOC to follow up as patient is currently in procedure.   Oleh Genin, [place]

## 2021-01-13 NOTE — Plan of Care (Signed)
Patient scheduled for G-tube placement this morning. He has no complaints of pain or discomfort, and has pleasant disposition. His appetite remains poor during shift as he does not have interest in eating anything before NPO at midnight. Pt in bed; in lowest position with call bell and personal items on bedside tray within easy reach. Breathing even and unlabored.  Problem: Activity: Goal: Risk for activity intolerance will decrease Outcome: Not Progressing   Problem: Nutrition: Goal: Adequate nutrition will be maintained Outcome: Not Progressing   Problem: Coping: Goal: Level of anxiety will decrease Outcome: Progressing   Problem: Pain Managment: Goal: General experience of comfort will improve Outcome: Progressing   Problem: Safety: Goal: Ability to remain free from injury will improve Outcome: Progressing

## 2021-01-13 NOTE — Progress Notes (Signed)
PROGRESS NOTE    Tyler Gallagher  GLO:[id number] DOB: [birth year] DOA: 01/11/2021 PCP: Juluis Pitch, MD    Brief Narrative:  60 y.o. 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 Pt was discharged on Keppra 500 mg bid and decadron 4 mg tid. Pt states that he was unable to fill his Keppra prescription after recent discharge. He states that he feels jittery all over since last night. States he had similar symptoms when he had a seizure and was admitted to [facility] 01/06/2021.  Patient does not have unilateral numbness or tingling in his extremities, no facial droop or slurred speech.  Patient has mild dry cough, but denies chest pain, shortness of breath.  No fever or chills.  No nausea, vomiting, diarrhea, abdominal pain, symptoms of UTI.  He is followed by Dr.Raowith oncology. He had his second cycle of Keytruda on 01/05/2021. He has status post whole brain radiation. Last chemotherapy was in October 2021. Pt is cheduled for G-tube placement tomorrow due to poor oral intake, weight loss and protein calorie malnutrition.  ED Course: pt was found to have WBC 15.6, negative urinalysis, negative COVID PCR, potassium 2.7, renal function okay, temperature 97.4, blood pressure soft, heart rate 120, 100, 22, oxygen saturation 95% on room air.  Chest x-ray is negative for new infiltration  Assessment & Plan:   Principal Problem:   Seizures ([place]) Active Problems:   Hyperlipidemia   Type 2 diabetes mellitus without complication, without long-term current use of insulin (HCC)   Brain metastases (HCC)   Malignant neoplasm of lower lobe of right lung (HCC)   Chronic systolic CHF (congestive heart failure) (HCC)    Hypokalemia   Leucocytosis   Protein-calorie malnutrition, moderate (HCC)   Seizure (HCC)   HTN (hypertension)   Protein-calorie malnutrition, severe   Recurrent Seizures ([place]):  -Patient was recently hospitalized to [facility] 1/7-1/9 due to seizure which is 2/2 to 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  -Pt is supposed to take Keppra 500 mg bid, but he did not start this med secondary to barriers obtaining meds, per pt -Presents with recurrent seizure -Now on scheduled keppra and decadron 4mg  q6hrs -Remains seizure free -MRI brain reviewed with Neurology over phone. Pt noted to have an increase in size and heterogeneity of parasagittal L parietal lesion with new extensive surrounding edema. Case was discussed with Neurology -Continue PRN ativan for breakthrough seizures  Protein-calorie malnutrition, moderate:  -Discussed with Surgery. Plan for G-tube placement 1/14 for supplemental nutrition -Discussed with Neurology who reviewed MRI with me over phone. Per Neurology, benefits to G tube placement may outweigh neurologic risk at this time -This AM, pt noted to be alert, oriented, neurologically intact and eating breakfast -Dietitian is following  Hyperlipidemia -pt was taking Zocor, but is not taking it currently  Type 2 diabetes mellitus without complication, without long-term current use of insulin ([place]):  -Recent A1c 5.7, well controlled.  Patient taking Actos -continue with SSI coverage as needed  Malignant neoplasm of lower lobe of right lung with bain metastases, also likely new liver and L subcostal soft tissue mets ([place]): - Continued with 4 mg decadron q6h -discussed with Surgery. Pt was found to have new large hepatic mets over L lobe  of liver with metastatic disease involving L subcostal soft tissue -Pt made aware of findings -f/u [name] of oncology  Chronic systolic CHF (congestive heart failure) [facility]):  -Patient does not have leg edema or JVD.   -CHF  seems to be compensated.  2D echo obtained/27/21 showed a EF of 30-35%.   -Patient is not taking diuretics.   -Appears to be euvolemic currently  Hypokalemia: K= 2.7 on admission. - Was replaced - continue to follow electrolytes and correct as needed  Leucocytosis:  -Presenting WBC 15.6. No source of infection identified, possibly due to Decadron use. UA negative. CXR negative for new infiltration.  Procalcitonin level<0.10.  Patient received 1 dose of cefepime by ED. will not continue antibiotics -Follow-up of blood culture and urine culture -recheck cbc in AM  HTN: -Cont with Metoprolol as tolerated  DVT prophylaxis: Heparin subq Code Status: Full Family Communication: Pt in room, family not at bedside  Status is: Observation  The patient remains OBS appropriate and will d/c before 2 midnights.  Dispo: The patient is from: Home              Anticipated d/c is to: Home              Anticipated d/c date is: 2 days              Patient currently is not medically stable to d/c.   Consultants:   Oncology  General Surgery  Discussed case with Neurology  Procedures:     Antimicrobials: Anti-infectives (From admission, onward)   Start     Dose/Rate Route Frequency Ordered Stop   01/14/21 0600  cefoTEtan (CEFOTAN) 2 g in sodium chloride 0.9 % 100 mL IVPB        2 g 200 mL/hr over 30 Minutes Intravenous On call to O.R. 01/13/21 1415 01/15/21 0559   01/13/21 0930  ciprofloxacin (CIPRO) IVPB 400 mg  Status:  Discontinued        400 mg 200 mL/hr over 60 Minutes Intravenous On call to O.R. 01/13/21 4696 01/13/21 0827   01/13/21 0915  cefoTEtan (CEFOTAN) 2 g in sodium chloride 0.9 % 100 mL IVPB        2 g 200 mL/hr over 30 Minutes Intravenous On call to O.R. 01/13/21 0827 01/13/21 1016   01/13/21 0909  sodium chloride 0.9 % with cefoTEtan (CEFOTAN) ADS Med       Note to Pharmacy: [facility]   : cabinet override      01/13/21 0909 01/13/21 0950   01/11/21 0815   ceFEPIme (MAXIPIME) 2 g in sodium chloride 0.9 % 100 mL IVPB        2 g 200 mL/hr over 30 Minutes Intravenous  Once 01/11/21 0802 01/11/21 0914      Subjective: Feeling tired after procedure this AM  Objective: Vitals:   01/13/21 1145 01/13/21 1204 01/13/21 1217 01/13/21 1411  BP: 96/72 91/70 (!) 84/65 112/78  Pulse: 78 73 74 72  Resp: 12 15 16 16   Temp:  (!) 97.1 F (36.2 C) 98.6 F (37 C) 98.3 F (36.8 C)  TempSrc:      SpO2: 97% 91% 94% 98%  Weight:      Height:        Intake/Output Summary (Last 24 hours) at 01/13/2021 1419 Last data filed at 01/13/2021 1110 Gross per 24 hour  Intake 1340 ml  Output 2175 ml  Net -835 ml   Filed Weights   01/11/21 0614  Weight:  72.6 kg    Examination: General exam: Awake, laying in bed, in nad Respiratory system: Normal respiratory effort, no wheezing Cardiovascular system: regular rate, s1, s2 Gastrointestinal system: Soft, nondistended, positive BS Central nervous system: CN2-12 grossly intact, strength intact Extremities: Perfused, no clubbing Skin: Normal skin turgor, no notable skin lesions seen Psychiatry: Mood normal // no visual hallucinations   Data Reviewed: I have personally reviewed following labs and imaging studies  CBC: Recent Labs  Lab 01/06/21 1428 01/06/21 1434 01/07/21 0524 01/11/21 0659 01/12/21 0145  WBC 14.4*  --  7.7 15.6* 10.0  NEUTROABS 10.7*  --   --  13.2*  --   HGB 13.0 14.3 11.2* 12.2* 11.0*  HCT 40.3 42.0 33.5* 37.2* 34.2*  MCV 91.4  --  89.6 90.7 91.4  PLT 363  --  259 285 188   Basic Metabolic Panel: Recent Labs  Lab 01/06/21 1428 01/06/21 1434 01/07/21 0524 01/08/21 0144 01/11/21 0659 01/11/21 1549 01/12/21 0145  NA 135   < > 139 139 136 137 135  K 2.5*   < > 3.2* 3.5 2.7* 3.7 3.9  CL 97*   < > 101 104 96* 101 102  CO2 16*  --  26 26 26 25 25   GLUCOSE 223*   < > 138* 112* 117* 108* 120*  BUN <5*   < > <5* <5* 8 8 8   CREATININE 0.84   < > 0.53* 0.54* 0.45* 0.50* 0.43*   CALCIUM 8.7*  --  8.4* 8.2* 8.3* 8.5* 8.4*  MG 1.5*  --   --  2.0 1.9  --   --    < > = values in this interval not displayed.   GFR: Estimated Creatinine Clearance: 103.4 mL/min (A) (by C-G formula based on SCr of 0.43 mg/dL (L)). Liver Function Tests: Recent Labs  Lab 01/06/21 1428 01/07/21 0524 01/11/21 0659  AST 36 24 23  ALT 23 19 23   ALKPHOS 189* 157* 147*  BILITOT 0.8 0.8 1.1  PROT 6.2* 5.8* 6.4*  ALBUMIN 2.5* 2.2* 2.6*   No results for input(s): LIPASE, AMYLASE in the last 168 hours. No results for input(s): AMMONIA in the last 168 hours. Coagulation Profile: Recent Labs  Lab 01/06/21 1428 01/07/21 0524 01/11/21 0659  INR 1.2 1.2 1.2   Cardiac Enzymes: No results for input(s): CKTOTAL, CKMB, CKMBINDEX, TROPONINI in the last 168 hours. BNP (last 3 results) No results for input(s): PROBNP in the last 8760 hours. HbA1C: No results for input(s): HGBA1C in the last 72 hours. CBG: Recent Labs  Lab 01/12/21 1654 01/12/21 2032 01/13/21 0822 01/13/21 1130 01/13/21 1406  GLUCAP 103* 174* 110* 129* 104*   Lipid Profile: No results for input(s): CHOL, HDL, LDLCALC, TRIG, CHOLHDL, LDLDIRECT in the last 72 hours. Thyroid Function Tests: No results for input(s): TSH, T4TOTAL, FREET4, T3FREE, THYROIDAB in the last 72 hours. Anemia Panel: No results for input(s): VITAMINB12, FOLATE, FERRITIN, TIBC, IRON, RETICCTPCT in the last 72 hours. Sepsis Labs: Recent Labs  Lab 01/11/21 0659  PROCALCITON <0.10  LATICACIDVEN 1.5    Recent Results (from the past 240 hour(s))  SARS CORONAVIRUS 2 (TAT 6-24 HRS) Nasopharyngeal Nasopharyngeal Swab     Status: None   Collection Time: 01/06/21  3:18 PM   Specimen: Nasopharyngeal Swab  Result Value Ref Range Status   SARS Coronavirus 2 NEGATIVE NEGATIVE Final    Comment: (NOTE) SARS-CoV-2 target nucleic acids are NOT DETECTED.  The SARS-CoV-2 RNA is generally detectable in upper and lower respiratory  specimens during the  acute phase of infection. Negative results do not preclude SARS-CoV-2 infection, do not rule out co-infections with other pathogens, and should not be used as the sole basis for treatment or other patient management decisions. Negative results must be combined with clinical observations, patient history, and epidemiological information. The expected result is Negative.  Fact Sheet for Patients: SugarRoll.be  Fact Sheet for Healthcare Providers: https://www.woods-mathews.com/  This test is not yet approved or cleared by the [place] FDA and  has been authorized for detection and/or diagnosis of SARS-CoV-2 by FDA under an Emergency Use Authorization (EUA). This EUA will remain  in effect (meaning this test can be used) for the duration of the COVID-19 declaration under Se ction 564(b)(1) of the Act, 21 U.S.C. section 360bbb-3(b)(1), unless the authorization is terminated or revoked sooner.  Performed at [facility], [place] [address] [place], [place] [address]   Urine culture     Status: Abnormal (Preliminary result)   Collection Time: 01/11/21  7:32 AM   Specimen: In/Out Cath Urine  Result Value Ref Range Status   Specimen Description   Final    IN/OUT CATH URINE Performed at [facility], [address] [place], [place] [address]    Special Requests   Final    NONE Performed at [facility], [address] [place], [place] [address]    Culture >=100,000 COLONIES/mL STAPHYLOCOCCUS EPIDERMIDIS (A)  Final   Report Status PENDING  Incomplete  Resp Panel by RT-PCR (Flu A&B, Covid) Nasopharyngeal Swab     Status: None   Collection Time: 01/11/21  7:32 AM   Specimen: Nasopharyngeal Swab; Nasopharyngeal(NP) swabs in vial transport medium  Result Value Ref Range Status   SARS Coronavirus 2 by RT PCR NEGATIVE NEGATIVE Final    Comment: (NOTE) SARS-CoV-2 target nucleic acids are NOT DETECTED.  The SARS-CoV-2  RNA is generally detectable in upper respiratory specimens during the acute phase of infection. The lowest concentration of SARS-CoV-2 viral copies this assay can detect is 138 copies/mL. A negative result does not preclude SARS-Cov-2 infection and should not be used as the sole basis for treatment or other patient management decisions. A negative result may occur with  improper specimen collection/handling, submission of specimen other than nasopharyngeal swab, presence of viral mutation(s) within the areas targeted by this assay, and inadequate number of viral copies(<138 copies/mL). A negative result must be combined with clinical observations, patient history, and epidemiological information. The expected result is Negative.  Fact Sheet for Patients:  EntrepreneurPulse.com.au  Fact Sheet for Healthcare Providers:  IncredibleEmployment.be  This test is no t yet approved or cleared by the [place] FDA and  has been authorized for detection and/or diagnosis of SARS-CoV-2 by FDA under an Emergency Use Authorization (EUA). This EUA will remain  in effect (meaning this test can be used) for the duration of the COVID-19 declaration under Section 564(b)(1) of the Act, 21 U.S.C.section 360bbb-3(b)(1), unless the authorization is terminated  or revoked sooner.       Influenza A by PCR NEGATIVE NEGATIVE Final   Influenza B by PCR NEGATIVE NEGATIVE Final    Comment: (NOTE) The Xpert Xpress SARS-CoV-2/FLU/RSV plus assay is intended as an aid in the diagnosis of influenza from Nasopharyngeal swab specimens and should not be used as a sole basis for treatment. Nasal washings and aspirates are unacceptable for Xpert Xpress SARS-CoV-2/FLU/RSV testing.  Fact Sheet for Patients: EntrepreneurPulse.com.au  Fact Sheet for Healthcare Providers: IncredibleEmployment.be  This  test is not yet approved or cleared by the  [place] and has been authorized for detection and/or diagnosis of SARS-CoV-2 by FDA under an Emergency Use Authorization (EUA). This EUA will remain in effect (meaning this test can be used) for the duration of the COVID-19 declaration under Section 564(b)(1) of the Act, 21 U.S.C. section 360bbb-3(b)(1), unless the authorization is terminated or revoked.  Performed at [facility], [address] [place], [place] [address]   Blood culture (routine x 2)     Status: None (Preliminary result)   Collection Time: 01/11/21  7:57 AM   Specimen: BLOOD  Result Value Ref Range Status   Specimen Description BLOOD BLOOD RIGHT ARM  Final   Special Requests   Final    BOTTLES DRAWN AEROBIC AND ANAEROBIC Blood Culture adequate volume   Culture   Final    NO GROWTH 2 DAYS Performed at [facility], [address], [place], [place] [address]    Report Status PENDING  Incomplete  Blood culture (routine x 2)     Status: None (Preliminary result)   Collection Time: 01/11/21  7:58 AM   Specimen: BLOOD  Result Value Ref Range Status   Specimen Description BLOOD BLOOD LEFT ARM  Final   Special Requests   Final    BOTTLES DRAWN AEROBIC AND ANAEROBIC Blood Culture adequate volume   Culture   Final    NO GROWTH 2 DAYS Performed at [facility], [address], [place], [place] [address]    Report Status PENDING  Incomplete     Radiology Studies: No results found.  Scheduled Meds: . acetaminophen  1,000 mg Oral Q6H  . [START ON 01/14/2021] acetaminophen  1,000 mg Oral On Call to OR  . [START ON 01/14/2021] celecoxib  200 mg Oral On Call to OR  . chlorhexidine  15 mL Mouth/Throat Once   Or  . mouth rinse  15 mL Mouth Rinse Once  . Chlorhexidine Gluconate Cloth  6 each Topical Daily  . Chlorhexidine Gluconate Cloth  6 each Topical Once   And  . Chlorhexidine Gluconate Cloth  6 each Topical Once  . dexamethasone  4 mg Oral Q6H  . dronabinol  5 mg Oral BID  AC  . famotidine  20 mg Oral Once  . fentaNYL      . [START ON 01/14/2021] gabapentin  300 mg Oral On Call to OR  . heparin  5,000 Units Subcutaneous Q8H  . insulin aspart  0-5 Units Subcutaneous QHS  . insulin aspart  0-9 Units Subcutaneous TID WC  . metoprolol succinate  12.5 mg Oral Daily  . vitamin B-12  1,000 mcg Oral Daily   Continuous Infusions: . sodium chloride    . [START ON 01/14/2021] cefoTEtan (CEFOTAN) IV    . levETIRAcetam 500 mg (01/13/21 6270)     LOS: 0 days   Marylu Lund, MD [facility] Pager On Amion  If 7PM-7AM, please contact night-coverage 01/13/2021, 2:19 PM

## 2021-01-14 DIAGNOSIS — I5022 Chronic systolic (congestive) heart failure: Secondary | ICD-10-CM | POA: Diagnosis not present

## 2021-01-14 DIAGNOSIS — Z48815 Encounter for surgical aftercare following surgery on the digestive system: Secondary | ICD-10-CM

## 2021-01-14 DIAGNOSIS — R569 Unspecified convulsions: Secondary | ICD-10-CM | POA: Diagnosis not present

## 2021-01-14 LAB — COMPREHENSIVE METABOLIC PANEL
ALT: 29 U/L (ref 0–44)
AST: 21 U/L (ref 15–41)
Albumin: 2.6 g/dL — ABNORMAL LOW (ref 3.5–5.0)
Alkaline Phosphatase: 136 U/L — ABNORMAL HIGH (ref 38–126)
Anion gap: 10 (ref 5–15)
BUN: 8 mg/dL (ref 6–20)
CO2: 28 mmol/L (ref 22–32)
Calcium: 8.2 mg/dL — ABNORMAL LOW (ref 8.9–10.3)
Chloride: 103 mmol/L (ref 98–111)
Creatinine, Ser: 0.38 mg/dL — ABNORMAL LOW (ref 0.61–1.24)
GFR, Estimated: >60 mL/min (ref >60)
Glucose, Bld: 104 mg/dL — ABNORMAL HIGH (ref 70–99)
Potassium: 3.4 mmol/L — ABNORMAL LOW (ref 3.5–5.1)
Sodium: 141 mmol/L (ref 135–145)
Total Bilirubin: 0.7 mg/dL (ref 0.3–1.2)
Total Protein: 5.7 g/dL — ABNORMAL LOW (ref 6.5–8.1)

## 2021-01-14 LAB — GLUCOSE, CAPILLARY
Glucose-Capillary: 102 mg/dL — ABNORMAL HIGH (ref 70–99)
Glucose-Capillary: 156 mg/dL — ABNORMAL HIGH (ref 70–99)
Glucose-Capillary: 151 mg/dL — ABNORMAL HIGH (ref 70–99)
Glucose-Capillary: 149 mg/dL — ABNORMAL HIGH (ref 70–99)

## 2021-01-14 LAB — URINE DRUG SCREEN, QUALITATIVE (ARMC ONLY)
Amphetamines, Ur Screen: NOT DETECTED
Barbiturates, Ur Screen: NOT DETECTED
Benzodiazepine, Ur Scrn: POSITIVE — AB
Cannabinoid 50 Ng, Ur ~~LOC~~: POSITIVE — AB
Cocaine Metabolite,Ur ~~LOC~~: NOT DETECTED
MDMA (Ecstasy)Ur Screen: NOT DETECTED
Methadone Scn, Ur: NOT DETECTED
Opiate, Ur Screen: NOT DETECTED
Phencyclidine (PCP) Ur S: NOT DETECTED
Tricyclic, Ur Screen: NOT DETECTED

## 2021-01-14 LAB — URINE CULTURE: Culture: >=100000 — AB

## 2021-01-14 LAB — MAGNESIUM: Magnesium: 2 mg/dL (ref 1.7–2.4)

## 2021-01-14 MED ORDER — ACETAMINOPHEN 500 MG PO TABS
1000.0000 mg | ORAL_TABLET | Freq: Four times a day (QID) | ORAL | Status: DC
  Administered 2021-01-14 – 2021-01-16 (×10): 1000 mg
  Filled 2021-01-14 (×11): qty 2

## 2021-01-14 MED ORDER — DEXAMETHASONE 4 MG PO TABS
4.0000 mg | ORAL_TABLET | Freq: Four times a day (QID) | ORAL | Status: DC
  Administered 2021-01-14 – 2021-01-17 (×10): 4 mg
  Filled 2021-01-14 (×16): qty 1

## 2021-01-14 MED ORDER — OXYCODONE HCL 5 MG PO TABS: 5.0000 mg | ORAL_TABLET | ORAL | Status: DC | PRN

## 2021-01-14 NOTE — Progress Notes (Signed)
PROGRESS NOTE    Tyler Gallagher  BZJ:[id number] DOB: [birth year] DOA: 01/11/2021 PCP: Juluis Pitch, MD    Brief Narrative:  60 y.o. 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 Pt was discharged on Keppra 500 mg bid and decadron 4 mg tid. Pt states that he was unable to fill his Keppra prescription after recent discharge. He states that he feels jittery all over since last night. States he had similar symptoms when he had a seizure and was admitted to [facility] 01/06/2021.  Patient does not have unilateral numbness or tingling in his extremities, no facial droop or slurred speech.  Patient has mild dry cough, but denies chest pain, shortness of breath.  No fever or chills.  No nausea, vomiting, diarrhea, abdominal pain, symptoms of UTI.  He is followed by Dr.Raowith oncology. He had his second cycle of Keytruda on 01/05/2021. He has status post whole brain radiation. Last chemotherapy was in October 2021. Pt is cheduled for G-tube placement tomorrow due to poor oral intake, weight loss and protein calorie malnutrition.  ED Course: pt was found to have WBC 15.6, negative urinalysis, negative COVID PCR, potassium 2.7, renal function okay, temperature 97.4, blood pressure soft, heart rate 120, 100, 22, oxygen saturation 95% on room air.  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  -Pt is supposed to take Keppra 500 mg bid, but he did not start this med secondary to barriers obtaining meds, per pt -Presents with recurrent seizure -Now on scheduled keppra and decadron 4mg  q6hrs -Remains seizure free -MRI brain reviewed with Neurology over phone. Pt noted to have an increase in size and heterogeneity of parasagittal L parietal lesion with new extensive surrounding edema. Case was discussed with Neurology -Cont without seizures  Protein-calorie malnutrition, moderate:  -Discussed with Surgery. Plan for G-tube placement 1/14 for supplemental nutrition -Discussed with Neurology who reviewed MRI with me over phone. Per Neurology, benefits to G tube placement may outweigh neurologic risk at this time -Continuing to tolerate PO, supplemental tube feeding per Nutrition  Hyperlipidemia -pt was taking Zocor, but is not taking it currently  Type 2 diabetes mellitus without complication, without long-term current use of insulin ([place]):  -Recent A1c 5.7, well controlled.  Patient taking Actos -continue with SSI coverage as needed  Malignant neoplasm of lower lobe of right lung with bain metastases, also likely new liver and L subcostal soft tissue mets ([place]): - Continued with 4 mg decadron q6h -discussed with Surgery. Pt was found to have new large hepatic mets over L lobe of liver with metastatic disease involving L subcostal soft tissue -  Pt made aware of findings -pt to f/u [name] of oncology  Chronic systolic CHF (congestive heart failure) [facility]):  -Patient does not have leg edema or JVD.   -CHF seems to be compensated.  2D echo obtained/27/21 showed a EF  of 30-35%.   -Patient is not taking diuretics.   -remains euvolemic  Hypokalemia: K= 2.7 on admission. - Was replaced - continue to follow electrolytes and correct as needed  Leucocytosis:  -Presenting WBC 15.6. No source of infection identified, possibly due to Decadron use. UA negative. CXR negative for new infiltration.  Procalcitonin level<0.10.  Patient received 1 dose of cefepime by ED. will not continue antibiotics -Follow-up of blood culture and urine culture -repeat cbc in AM  HTN: -Cont with Metoprolol as pt tolerates  DVT prophylaxis: Heparin subq Code Status: Full Family Communication: Pt in room, family not at bedside  Status is: Inpatient  The patient remains OBS appropriate and will d/c before 2 midnights.  Dispo: The patient is from: Home              Anticipated d/c is to: Home              Anticipated d/c date is: 2 days              Patient currently is not medically stable to d/c.   Consultants:   Oncology  General Surgery  Discussed case with Neurology  Procedures:     Antimicrobials: Anti-infectives (From admission, onward)   Start     Dose/Rate Route Frequency Ordered Stop   01/14/21 0600  cefoTEtan (CEFOTAN) 2 g in sodium chloride 0.9 % 100 mL IVPB  Status:  Discontinued        2 g 200 mL/hr over 30 Minutes Intravenous On call to O.R. 01/13/21 1415 01/13/21 1421   01/13/21 0930  ciprofloxacin (CIPRO) IVPB 400 mg  Status:  Discontinued        400 mg 200 mL/hr over 60 Minutes Intravenous On call to O.R. 01/13/21 8182 01/13/21 0827   01/13/21 0915  cefoTEtan (CEFOTAN) 2 g in sodium chloride 0.9 % 100 mL IVPB        2 g 200 mL/hr over 30 Minutes Intravenous On call to O.R. 01/13/21 0827 01/13/21 1016   01/13/21 0909  sodium chloride 0.9 % with cefoTEtan (CEFOTAN) ADS Med       Note to Pharmacy: [place] [place]   : cabinet override      01/13/21 0909 01/13/21 0950   01/11/21 0815  ceFEPIme (MAXIPIME) 2 g in sodium chloride 0.9 % 100 mL  IVPB        2 g 200 mL/hr over 30 Minutes Intravenous  Once 01/11/21 0802 01/11/21 0914      Subjective: Questioning about going home  Objective: Vitals:   01/14/21 0059 01/14/21 0752 01/14/21 0838 01/14/21 1212  BP: 113/76 105/74 111/82 104/76  Pulse: (!) 57 73 89 79  Resp: 16 14 17 16   Temp: 97.6 F (36.4 C)  97.9 F (36.6 C) 97.8 F (36.6 C)  TempSrc:      SpO2: 99% 98% 99% 98%  Weight:      Height:        Intake/Output Summary (Last 24 hours) at 01/14/2021 1445 Last data filed at 01/14/2021 1100 Gross per 24 hour  Intake 749.61 ml  Output 700 ml  Net 49.61 ml   Filed Weights   01/11/21 0614  Weight: 72.6 kg    Examination: General exam: Awake,  laying in bed, in nad Respiratory system: Normal respiratory effort, no wheezing Cardiovascular system: regular rate, s1, s2 Gastrointestinal system: Soft, nondistended, positive BS Central nervous system: CN2-12 grossly intact, strength intact Extremities: Perfused, no clubbing Skin: Normal skin turgor, no notable skin lesions seen Psychiatry: Mood normal // no visual hallucinations   Data Reviewed: I have personally reviewed following labs and imaging studies  CBC: Recent Labs  Lab 01/11/21 0659 01/12/21 0145  WBC 15.6* 10.0  NEUTROABS 13.2*  --   HGB 12.2* 11.0*  HCT 37.2* 34.2*  MCV 90.7 91.4  PLT 285 662   Basic Metabolic Panel: Recent Labs  Lab 01/08/21 0144 01/11/21 0659 01/11/21 1549 01/12/21 0145 01/14/21 0644  NA 139 136 137 135 141  K 3.5 2.7* 3.7 3.9 3.4*  CL 104 96* 101 102 103  CO2 26 26 25 25 28   GLUCOSE 112* 117* 108* 120* 104*  BUN <5* 8 8 8 8   CREATININE 0.54* 0.45* 0.50* 0.43* 0.38*  CALCIUM 8.2* 8.3* 8.5* 8.4* 8.2*  MG 2.0 1.9  --   --  2.0   GFR: Estimated Creatinine Clearance: 103.4 mL/min (A) (by C-G formula based on SCr of 0.38 mg/dL (L)). Liver Function Tests: Recent Labs  Lab 01/11/21 0659 01/14/21 0644  AST 23 21  ALT 23 29  ALKPHOS 147* 136*  BILITOT 1.1 0.7   PROT 6.4* 5.7*  ALBUMIN 2.6* 2.6*   No results for input(s): LIPASE, AMYLASE in the last 168 hours. No results for input(s): AMMONIA in the last 168 hours. Coagulation Profile: Recent Labs  Lab 01/11/21 0659  INR 1.2   Cardiac Enzymes: No results for input(s): CKTOTAL, CKMB, CKMBINDEX, TROPONINI in the last 168 hours. BNP (last 3 results) No results for input(s): PROBNP in the last 8760 hours. HbA1C: No results for input(s): HGBA1C in the last 72 hours. CBG: Recent Labs  Lab 01/13/21 1406 01/13/21 1647 01/13/21 2124 01/14/21 0838 01/14/21 1216  GLUCAP 104* 160* 117* 102* 156*   Lipid Profile: No results for input(s): CHOL, HDL, LDLCALC, TRIG, CHOLHDL, LDLDIRECT in the last 72 hours. Thyroid Function Tests: No results for input(s): TSH, T4TOTAL, FREET4, T3FREE, THYROIDAB in the last 72 hours. Anemia Panel: No results for input(s): VITAMINB12, FOLATE, FERRITIN, TIBC, IRON, RETICCTPCT in the last 72 hours. Sepsis Labs: Recent Labs  Lab 01/11/21 0659  PROCALCITON <0.10  LATICACIDVEN 1.5    Recent Results (from the past 240 hour(s))  SARS CORONAVIRUS 2 (TAT 6-24 HRS) Nasopharyngeal Nasopharyngeal Swab     Status: None   Collection Time: 01/06/21  3:18 PM   Specimen: Nasopharyngeal Swab  Result Value Ref Range Status   SARS Coronavirus 2 NEGATIVE NEGATIVE Final    Comment: (NOTE) SARS-CoV-2 target nucleic acids are NOT DETECTED.  The SARS-CoV-2 RNA is generally detectable in upper and lower respiratory specimens during the acute phase of infection. Negative results do not preclude SARS-CoV-2 infection, do not rule out co-infections with other pathogens, and should not be used as the sole basis for treatment or other patient management decisions. Negative results must be combined with clinical observations, patient history, and epidemiological information. The expected result is Negative.  Fact Sheet for  Patients: SugarRoll.be  Fact Sheet for Healthcare Providers: https://www.woods-mathews.com/  This test is not yet approved or cleared by the [place] FDA and  has been authorized for detection and/or diagnosis of SARS-CoV-2 by FDA under an Emergency Use Authorization (EUA). This EUA will remain  in effect (meaning this test can be used)  for the duration of the COVID-19 declaration under Se ction 564(b)(1) of the Act, 21 U.S.C. section 360bbb-3(b)(1), unless the authorization is terminated or revoked sooner.  Performed at [facility], [place] [address], [place], [place] [address]   Urine culture     Status: Abnormal   Collection Time: 01/11/21  7:32 AM   Specimen: In/Out Cath Urine  Result Value Ref Range Status   Specimen Description   Final    IN/OUT CATH URINE Performed at [facility], [address] [place], [place] [address]    Special Requests   Final    NONE Performed at [facility], [address] [place], [place] [address]    Culture >=100,000 COLONIES/mL STAPHYLOCOCCUS EPIDERMIDIS (A)  Final   Report Status 01/14/2021 FINAL  Final   Organism ID, Bacteria STAPHYLOCOCCUS EPIDERMIDIS (A)  Final      Susceptibility   Staphylococcus epidermidis - MIC*    CIPROFLOXACIN >=8 RESISTANT Resistant     GENTAMICIN 8 INTERMEDIATE Intermediate     NITROFURANTOIN <=16 SENSITIVE Sensitive     OXACILLIN >=4 RESISTANT Resistant     TETRACYCLINE 2 SENSITIVE Sensitive     VANCOMYCIN 1 SENSITIVE Sensitive     TRIMETH/SULFA 80 RESISTANT Resistant     CLINDAMYCIN >=8 RESISTANT Resistant     RIFAMPIN <=0.5 SENSITIVE Sensitive     Inducible Clindamycin NEGATIVE Sensitive     * >=100,000 COLONIES/mL STAPHYLOCOCCUS EPIDERMIDIS  Resp Panel by RT-PCR (Flu A&B, Covid) Nasopharyngeal Swab     Status: None   Collection Time: 01/11/21  7:32 AM   Specimen: Nasopharyngeal Swab; Nasopharyngeal(NP) swabs in vial transport  medium  Result Value Ref Range Status   SARS Coronavirus 2 by RT PCR NEGATIVE NEGATIVE Final    Comment: (NOTE) SARS-CoV-2 target nucleic acids are NOT DETECTED.  The SARS-CoV-2 RNA is generally detectable in upper respiratory specimens during the acute phase of infection. The lowest concentration of SARS-CoV-2 viral copies this assay can detect is 138 copies/mL. A negative result does not preclude SARS-Cov-2 infection and should not be used as the sole basis for treatment or other patient management decisions. A negative result may occur with  improper specimen collection/handling, submission of specimen other than nasopharyngeal swab, presence of viral mutation(s) within the areas targeted by this assay, and inadequate number of viral copies(<138 copies/mL). A negative result must be combined with clinical observations, patient history, and epidemiological information. The expected result is Negative.  Fact Sheet for Patients:  EntrepreneurPulse.com.au  Fact Sheet for Healthcare Providers:  IncredibleEmployment.be  This test is no t yet approved or cleared by the [place] FDA and  has been authorized for detection and/or diagnosis of SARS-CoV-2 by FDA under an Emergency Use Authorization (EUA). This EUA will remain  in effect (meaning this test can be used) for the duration of the COVID-19 declaration under Section 564(b)(1) of the Act, 21 U.S.C.section 360bbb-3(b)(1), unless the authorization is terminated  or revoked sooner.       Influenza A by PCR NEGATIVE NEGATIVE Final   Influenza B by PCR NEGATIVE NEGATIVE Final    Comment: (NOTE) The Xpert Xpress SARS-CoV-2/FLU/RSV plus assay is intended as an aid in the diagnosis of influenza from Nasopharyngeal swab specimens and should not be used as a sole basis for treatment. Nasal washings and aspirates are unacceptable for Xpert Xpress SARS-CoV-2/FLU/RSV testing.  Fact Sheet for  Patients: EntrepreneurPulse.com.au  Fact Sheet for Healthcare Providers: IncredibleEmployment.be  This test is not yet approved or  cleared by the [place] and has been authorized for detection and/or diagnosis of SARS-CoV-2 by FDA under an Emergency Use Authorization (EUA). This EUA will remain in effect (meaning this test can be used) for the duration of the COVID-19 declaration under Section 564(b)(1) of the Act, 21 U.S.C. section 360bbb-3(b)(1), unless the authorization is terminated or revoked.  Performed at [facility], [address] [place], [place] [address]   Blood culture (routine x 2)     Status: None (Preliminary result)   Collection Time: 01/11/21  7:57 AM   Specimen: BLOOD  Result Value Ref Range Status   Specimen Description BLOOD BLOOD RIGHT ARM  Final   Special Requests   Final    BOTTLES DRAWN AEROBIC AND ANAEROBIC Blood Culture adequate volume   Culture   Final    NO GROWTH 3 DAYS Performed at [facility], [address] [place], [place] [address]    Report Status PENDING  Incomplete  Blood culture (routine x 2)     Status: None (Preliminary result)   Collection Time: 01/11/21  7:58 AM   Specimen: BLOOD  Result Value Ref Range Status   Specimen Description BLOOD BLOOD LEFT ARM  Final   Special Requests   Final    BOTTLES DRAWN AEROBIC AND ANAEROBIC Blood Culture adequate volume   Culture   Final    NO GROWTH 3 DAYS Performed at [facility], [address] [place], [place] [address]    Report Status PENDING  Incomplete     Radiology Studies: No results found.  Scheduled Meds: . acetaminophen  1,000 mg Per Tube Q6H  . Chlorhexidine Gluconate Cloth  6 each Topical Daily  . dexamethasone  4 mg Per Tube Q6H  . dronabinol  5 mg Oral BID AC  . feeding supplement (OSMOLITE 1.5 CAL)  120 mL Per Tube QID  . [START ON 01/15/2021] feeding supplement (OSMOLITE 1.5 CAL)  237 mL Per  Tube QID  . [START ON 01/16/2021] feeding supplement (OSMOLITE 1.5 CAL)  237 mL Per Tube 6 X Daily  . heparin  5,000 Units Subcutaneous Q8H  . insulin aspart  0-5 Units Subcutaneous QHS  . insulin aspart  0-9 Units Subcutaneous TID WC  . metoprolol succinate  12.5 mg Oral Daily  . vitamin B-12  1,000 mcg Oral Daily   Continuous Infusions: . sodium chloride 10 mL/hr at 01/13/21 1654  . levETIRAcetam 500 mg (01/14/21 7253)     LOS: 1 day   Marylu Lund, MD [facility] Pager On Amion  If 7PM-7AM, please contact night-coverage 01/14/2021, 2:45 PM

## 2021-01-14 NOTE — Progress Notes (Signed)
Patient is POD1 from robot-assisted laparoscopic G-tube.  No issues overnight.  Reports that the nurse flushed the tube and a little fluid dripped on him, but he wasn't sure if it was actually leaking from the tube.  Today's Vitals   01/14/21 0059 01/14/21 0752 01/14/21 0838 01/14/21 1212  BP: 113/76 105/74 111/82 104/76  Pulse: (!) 57 73 89 79  Resp: 16 14 17 16   Temp: 97.6 F (36.4 C)  97.9 F (36.6 C) 97.8 F (36.6 C)  TempSrc:      SpO2: 99% 98% 99% 98%  Weight:      Height:      PainSc:  0-No pain     Body mass index is 20 kg/m. I inspected the G-tube site.  It was clean, dry, and intact.  I was able to slide the bolster down about 1/4 cm to make it snug but not tight.  A/P: doing well after G-tube placement. The tube may be used without restrictions.  Routine G-tube care.  General surgery will sign off.

## 2021-01-14 NOTE — Progress Notes (Signed)
PROGRESS NOTE    Tyler Gallagher  ZOX:[id number] DOB: 1961-12-28 DOA: 01/11/2021 PCP: Juluis Pitch, MD    Brief Narrative:  60 y.o. 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 Pt was discharged on Keppra 500 mg bid and decadron 4 mg tid. Pt states that he was unable to fill his Keppra prescription after recent discharge. He states that he feels jittery all over since last night. States he had similar symptoms when he had a seizure and was admitted to [facility] 01/06/2021.  Patient does not have unilateral numbness or tingling in his extremities, no facial droop or slurred speech.  Patient has mild dry cough, but denies chest pain, shortness of breath.  No fever or chills.  No nausea, vomiting, diarrhea, abdominal pain, symptoms of UTI.  He is followed by Dr.Raowith oncology. He had his second cycle of Keytruda on 01/05/2021. He has status post whole brain radiation. Last chemotherapy was in October 2021. Pt is cheduled for G-tube placement tomorrow due to poor oral intake, weight loss and protein calorie malnutrition.  ED Course: pt was found to have WBC 15.6, negative urinalysis, negative COVID PCR, potassium 2.7, renal function okay, temperature 97.4, blood pressure soft, heart rate 120, 100, 22, oxygen saturation 95% on room air.  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  -Pt is supposed to take Keppra 500 mg bid, but he did not start this med secondary to barriers obtaining meds, per pt -Presents with recurrent seizure -Now on scheduled keppra and decadron 4mg  q6hrs -Remains seizure free -MRI brain reviewed with Neurology over phone. Pt noted to have an increase in size and heterogeneity of parasagittal L parietal lesion with new extensive surrounding edema. Case was discussed with Neurology -Remains without seizures  Protein-calorie malnutrition, moderate:  -Discussed with Surgery. Plan for G-tube placement 1/14 for supplemental nutrition -Discussed with Neurology who reviewed MRI with me over phone. Per Neurology, benefits to G tube placement may outweigh neurologic risk at this time -Continuing to tolerate PO, supplemental tube feeding per Nutrition  Hyperlipidemia -pt was taking Zocor, but is not taking it currently  Type 2 diabetes mellitus without complication, without long-term current use of insulin ([place]):  -Recent A1c 5.7, well controlled.  Patient taking Actos -continue with SSI coverage as needed  Malignant neoplasm of lower lobe of right lung with bain metastases, also likely new liver and L subcostal soft tissue mets ([place]): - Continued with 4 mg decadron q6h -discussed with Surgery. Pt was found to have new large hepatic mets over L lobe of liver with metastatic disease involving L subcostal soft tissue -  Pt made aware of findings -pt to f/u [name] of oncology  Chronic systolic CHF (congestive heart failure) [facility]):  -Patient does not have leg edema or JVD.   -CHF seems to be compensated.  2D echo obtained/27/21 showed a  EF of 30-35%.   -Patient is not taking diuretics.   -Appears to be euvolemic currently  Hypokalemia: K= 2.7 on admission. - Was replaced - continue to follow electrolytes and correct as needed  Leucocytosis:  -Presenting WBC 15.6. No source of infection identified, possibly due to Decadron use. UA negative. CXR negative for new infiltration.  Procalcitonin level<0.10.  Patient received 1 dose of cefepime by ED. will not continue antibiotics -Follow-up of blood culture and urine culture -recheck cbc in AM  HTN: -Cont with Metoprolol as tolerated  DVT prophylaxis: Heparin subq Code Status: Full Family Communication: Pt in room, family not at bedside  Status is: Inpatient  The patient remains OBS appropriate and will d/c before 2 midnights.  Dispo: The patient is from: Home              Anticipated d/c is to: Home              Anticipated d/c date is: 2 days              Patient currently is not medically stable to d/c.   Consultants:   Oncology  General Surgery  Discussed case with Neurology  Procedures:     Antimicrobials: Anti-infectives (From admission, onward)   Start     Dose/Rate Route Frequency Ordered Stop   01/14/21 0600  cefoTEtan (CEFOTAN) 2 g in sodium chloride 0.9 % 100 mL IVPB  Status:  Discontinued        2 g 200 mL/hr over 30 Minutes Intravenous On call to O.R. 01/13/21 1415 01/13/21 1421   01/13/21 0930  ciprofloxacin (CIPRO) IVPB 400 mg  Status:  Discontinued        400 mg 200 mL/hr over 60 Minutes Intravenous On call to O.R. 01/13/21 2458 01/13/21 0827   01/13/21 0915  cefoTEtan (CEFOTAN) 2 g in sodium chloride 0.9 % 100 mL IVPB        2 g 200 mL/hr over 30 Minutes Intravenous On call to O.R. 01/13/21 0827 01/13/21 1016   01/13/21 0909  sodium chloride 0.9 % with cefoTEtan (CEFOTAN) ADS Med       Note to Pharmacy: [facility] [place]   : cabinet override      01/13/21 0909 01/13/21 0950   01/11/21 0815  ceFEPIme (MAXIPIME) 2 g in sodium  chloride 0.9 % 100 mL IVPB        2 g 200 mL/hr over 30 Minutes Intravenous  Once 01/11/21 0802 01/11/21 0914      Subjective: Asking about going home  Objective: Vitals:   01/14/21 0059 01/14/21 0752 01/14/21 0838 01/14/21 1212  BP: 113/76 105/74 111/82 104/76  Pulse: (!) 57 73 89 79  Resp: 16 14 17 16   Temp: 97.6 F (36.4 C)  97.9 F (36.6 C) 97.8 F (36.6 C)  TempSrc:      SpO2: 99% 98% 99% 98%  Weight:      Height:        Intake/Output Summary (Last 24 hours) at 01/14/2021 1426 Last data filed at 01/14/2021 1100 Gross per 24 hour  Intake 749.61 ml  Output 700 ml  Net 49.61 ml   Filed Weights   01/11/21 0614  Weight: 72.6 kg    Examination: General  exam: Conversant, in no acute distress Respiratory system: normal chest rise, clear, no audible wheezing Cardiovascular system: regular rhythm, s1-s2 Gastrointestinal system: Nondistended, nontender, pos BS, PEG in place Central nervous system: No seizures, no tremors Extremities: No cyanosis, no joint deformities Skin: No rashes, no pallor Psychiatry: Affect normal // no auditory hallucinations   Data Reviewed: I have personally reviewed following labs and imaging studies  CBC: Recent Labs  Lab 01/11/21 0659 01/12/21 0145  WBC 15.6* 10.0  NEUTROABS 13.2*  --   HGB 12.2* 11.0*  HCT 37.2* 34.2*  MCV 90.7 91.4  PLT 285 425   Basic Metabolic Panel: Recent Labs  Lab 01/08/21 0144 01/11/21 0659 01/11/21 1549 01/12/21 0145 01/14/21 0644  NA 139 136 137 135 141  K 3.5 2.7* 3.7 3.9 3.4*  CL 104 96* 101 102 103  CO2 26 26 25 25 28   GLUCOSE 112* 117* 108* 120* 104*  BUN <5* 8 8 8 8   CREATININE 0.54* 0.45* 0.50* 0.43* 0.38*  CALCIUM 8.2* 8.3* 8.5* 8.4* 8.2*  MG 2.0 1.9  --   --  2.0   GFR: Estimated Creatinine Clearance: 103.4 mL/min (A) (by C-G formula based on SCr of 0.38 mg/dL (L)). Liver Function Tests: Recent Labs  Lab 01/11/21 0659 01/14/21 0644  AST 23 21  ALT 23 29  ALKPHOS 147* 136*   BILITOT 1.1 0.7  PROT 6.4* 5.7*  ALBUMIN 2.6* 2.6*   No results for input(s): LIPASE, AMYLASE in the last 168 hours. No results for input(s): AMMONIA in the last 168 hours. Coagulation Profile: Recent Labs  Lab 01/11/21 0659  INR 1.2   Cardiac Enzymes: No results for input(s): CKTOTAL, CKMB, CKMBINDEX, TROPONINI in the last 168 hours. BNP (last 3 results) No results for input(s): PROBNP in the last 8760 hours. HbA1C: No results for input(s): HGBA1C in the last 72 hours. CBG: Recent Labs  Lab 01/13/21 1406 01/13/21 1647 01/13/21 2124 01/14/21 0838 01/14/21 1216  GLUCAP 104* 160* 117* 102* 156*   Lipid Profile: No results for input(s): CHOL, HDL, LDLCALC, TRIG, CHOLHDL, LDLDIRECT in the last 72 hours. Thyroid Function Tests: No results for input(s): TSH, T4TOTAL, FREET4, T3FREE, THYROIDAB in the last 72 hours. Anemia Panel: No results for input(s): VITAMINB12, FOLATE, FERRITIN, TIBC, IRON, RETICCTPCT in the last 72 hours. Sepsis Labs: Recent Labs  Lab 01/11/21 0659  PROCALCITON <0.10  LATICACIDVEN 1.5    Recent Results (from the past 240 hour(s))  SARS CORONAVIRUS 2 (TAT 6-24 HRS) Nasopharyngeal Nasopharyngeal Swab     Status: None   Collection Time: 01/06/21  3:18 PM   Specimen: Nasopharyngeal Swab  Result Value Ref Range Status   SARS Coronavirus 2 NEGATIVE NEGATIVE Final    Comment: (NOTE) SARS-CoV-2 target nucleic acids are NOT DETECTED.  The SARS-CoV-2 RNA is generally detectable in upper and lower respiratory specimens during the acute phase of infection. Negative results do not preclude SARS-CoV-2 infection, do not rule out co-infections with other pathogens, and should not be used as the sole basis for treatment or other patient management decisions. Negative results must be combined with clinical observations, patient history, and epidemiological information. The expected result is Negative.  Fact Sheet for  Patients: SugarRoll.be  Fact Sheet for Healthcare Providers: https://www.woods-mathews.com/  This test is not yet approved or cleared by the [place] FDA and  has been authorized for detection and/or diagnosis of SARS-CoV-2 by FDA under an Emergency Use Authorization (EUA). This EUA will remain  in effect (meaning this test can  be used) for the duration of the COVID-19 declaration under Se ction 564(b)(1) of the Act, 21 U.S.C. section 360bbb-3(b)(1), unless the authorization is terminated or revoked sooner.  Performed at [facility], [place] [address] [place], [place] [address]   Urine culture     Status: Abnormal   Collection Time: 01/11/21  7:32 AM   Specimen: In/Out Cath Urine  Result Value Ref Range Status   Specimen Description   Final    IN/OUT CATH URINE Performed at [facility], [address] [place], [place] [address]    Special Requests   Final    NONE Performed at [facility], [address] [place],  [address]    Culture >=100,000 COLONIES/mL STAPHYLOCOCCUS EPIDERMIDIS (A)  Final   Report Status 01/14/2021 FINAL  Final   Organism ID, Bacteria STAPHYLOCOCCUS EPIDERMIDIS (A)  Final      Susceptibility   Staphylococcus epidermidis - MIC*    CIPROFLOXACIN >=8 RESISTANT Resistant     GENTAMICIN 8 INTERMEDIATE Intermediate     NITROFURANTOIN <=16 SENSITIVE Sensitive     OXACILLIN >=4 RESISTANT Resistant     TETRACYCLINE 2 SENSITIVE Sensitive     VANCOMYCIN 1 SENSITIVE Sensitive     TRIMETH/SULFA 80 RESISTANT Resistant     CLINDAMYCIN >=8 RESISTANT Resistant     RIFAMPIN <=0.5 SENSITIVE Sensitive     Inducible Clindamycin NEGATIVE Sensitive     * >=100,000 COLONIES/mL STAPHYLOCOCCUS EPIDERMIDIS  Resp Panel by RT-PCR (Flu A&B, Covid) Nasopharyngeal Swab     Status: None   Collection Time: 01/11/21  7:32 AM   Specimen: Nasopharyngeal Swab; Nasopharyngeal(NP) swabs in vial transport  medium  Result Value Ref Range Status   SARS Coronavirus 2 by RT PCR NEGATIVE NEGATIVE Final    Comment: (NOTE) SARS-CoV-2 target nucleic acids are NOT DETECTED.  The SARS-CoV-2 RNA is generally detectable in upper respiratory specimens during the acute phase of infection. The lowest concentration of SARS-CoV-2 viral copies this assay can detect is 138 copies/mL. A negative result does not preclude SARS-Cov-2 infection and should not be used as the sole basis for treatment or other patient management decisions. A negative result may occur with  improper specimen collection/handling, submission of specimen other than nasopharyngeal swab, presence of viral mutation(s) within the areas targeted by this assay, and inadequate number of viral copies(<138 copies/mL). A negative result must be combined with clinical observations, patient history, and epidemiological information. The expected result is Negative.  Fact Sheet for Patients:  EntrepreneurPulse.com.au  Fact Sheet for Healthcare Providers:  IncredibleEmployment.be  This test is no t yet approved or cleared by the [place] FDA and  has been authorized for detection and/or diagnosis of SARS-CoV-2 by FDA under an Emergency Use Authorization (EUA). This EUA will remain  in effect (meaning this test can be used) for the duration of the COVID-19 declaration under Section 564(b)(1) of the Act, 21 U.S.C.section 360bbb-3(b)(1), unless the authorization is terminated  or revoked sooner.       Influenza A by PCR NEGATIVE NEGATIVE Final   Influenza B by PCR NEGATIVE NEGATIVE Final    Comment: (NOTE) The Xpert Xpress SARS-CoV-2/FLU/RSV plus assay is intended as an aid in the diagnosis of influenza from Nasopharyngeal swab specimens and should not be used as a sole basis for treatment. Nasal washings and aspirates are unacceptable for Xpert Xpress SARS-CoV-2/FLU/RSV testing.  Fact Sheet for  Patients: EntrepreneurPulse.com.au  Fact Sheet for Healthcare Providers: IncredibleEmployment.be  This test is not yet  approved or cleared by the [place] and has been authorized for detection and/or diagnosis of SARS-CoV-2 by FDA under an Emergency Use Authorization (EUA). This EUA will remain in effect (meaning this test can be used) for the duration of the COVID-19 declaration under Section 564(b)(1) of the Act, 21 U.S.C. section 360bbb-3(b)(1), unless the authorization is terminated or revoked.  Performed at [facility], [address] [place], [place] [address]   Blood culture (routine x 2)     Status: None (Preliminary result)   Collection Time: 01/11/21  7:57 AM   Specimen: BLOOD  Result Value Ref Range Status   Specimen Description BLOOD BLOOD RIGHT ARM  Final   Special Requests   Final    BOTTLES DRAWN AEROBIC AND ANAEROBIC Blood Culture adequate volume   Culture   Final    NO GROWTH 3 DAYS Performed at [facility], [address] [place], [place] [address]    Report Status PENDING  Incomplete  Blood culture (routine x 2)     Status: None (Preliminary result)   Collection Time: 01/11/21  7:58 AM   Specimen: BLOOD  Result Value Ref Range Status   Specimen Description BLOOD BLOOD LEFT ARM  Final   Special Requests   Final    BOTTLES DRAWN AEROBIC AND ANAEROBIC Blood Culture adequate volume   Culture   Final    NO GROWTH 3 DAYS Performed at [facility], [address], [place], [place] [address]    Report Status PENDING  Incomplete     Radiology Studies: No results found.  Scheduled Meds: . acetaminophen  1,000 mg Per Tube Q6H  . Chlorhexidine Gluconate Cloth  6 each Topical Daily  . dexamethasone  4 mg Per Tube Q6H  . dronabinol  5 mg Oral BID AC  . feeding supplement (OSMOLITE 1.5 CAL)  120 mL Per Tube QID  . [START ON 01/15/2021] feeding supplement (OSMOLITE 1.5 CAL)  237 mL Per  Tube QID  . [START ON 01/16/2021] feeding supplement (OSMOLITE 1.5 CAL)  237 mL Per Tube 6 X Daily  . heparin  5,000 Units Subcutaneous Q8H  . insulin aspart  0-5 Units Subcutaneous QHS  . insulin aspart  0-9 Units Subcutaneous TID WC  . metoprolol succinate  12.5 mg Oral Daily  . vitamin B-12  1,000 mcg Oral Daily   Continuous Infusions: . sodium chloride 10 mL/hr at 01/13/21 1654  . levETIRAcetam 500 mg (01/14/21 5638)     LOS: 1 day   Marylu Lund, MD [facility] Pager On Amion  If 7PM-7AM, please contact night-coverage 01/14/2021, 2:26 PM

## 2021-01-15 DIAGNOSIS — I5022 Chronic systolic (congestive) heart failure: Secondary | ICD-10-CM | POA: Diagnosis not present

## 2021-01-15 DIAGNOSIS — R569 Unspecified convulsions: Secondary | ICD-10-CM | POA: Diagnosis not present

## 2021-01-15 DIAGNOSIS — R531 Weakness: Secondary | ICD-10-CM | POA: Diagnosis not present

## 2021-01-15 LAB — COMPREHENSIVE METABOLIC PANEL
ALT: 31 U/L (ref 0–44)
AST: 21 U/L (ref 15–41)
Albumin: 2.6 g/dL — ABNORMAL LOW (ref 3.5–5.0)
Alkaline Phosphatase: 154 U/L — ABNORMAL HIGH (ref 38–126)
Anion gap: 8 (ref 5–15)
BUN: 8 mg/dL (ref 6–20)
CO2: 31 mmol/L (ref 22–32)
Calcium: 8.9 mg/dL (ref 8.9–10.3)
Chloride: 101 mmol/L (ref 98–111)
Creatinine, Ser: 0.45 mg/dL — ABNORMAL LOW (ref 0.61–1.24)
GFR, Estimated: >60 mL/min (ref >60)
Glucose, Bld: 95 mg/dL (ref 70–99)
Potassium: 3.3 mmol/L — ABNORMAL LOW (ref 3.5–5.1)
Sodium: 140 mmol/L (ref 135–145)
Total Bilirubin: 0.5 mg/dL (ref 0.3–1.2)
Total Protein: 5.8 g/dL — ABNORMAL LOW (ref 6.5–8.1)

## 2021-01-15 LAB — MAGNESIUM: Magnesium: 2 mg/dL (ref 1.7–2.4)

## 2021-01-15 LAB — GLUCOSE, CAPILLARY: Glucose-Capillary: 208 mg/dL — ABNORMAL HIGH (ref 70–99)

## 2021-01-15 MED ORDER — CALCIUM CARBONATE ANTACID 500 MG PO CHEW
1.0000 | CHEWABLE_TABLET | Freq: Three times a day (TID) | ORAL | Status: DC | PRN
  Administered 2021-01-15 (×2): 200 mg via ORAL
  Filled 2021-01-15 (×2): qty 1

## 2021-01-15 MED ORDER — POTASSIUM CHLORIDE CRYS ER 20 MEQ PO TBCR
40.0000 meq | EXTENDED_RELEASE_TABLET | Freq: Once | ORAL | Status: AC
  Administered 2021-01-15: 40 meq via ORAL
  Filled 2021-01-15: qty 2

## 2021-01-15 NOTE — Progress Notes (Signed)
PROGRESS NOTE    Tyler Gallagher  GMW:[id number] DOB: [birth year] DOA: 01/11/2021 PCP: Juluis Pitch, MD    Brief Narrative:  60 y.o. 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 Pt was discharged on Keppra 500 mg bid and decadron 4 mg tid. Pt states that he was unable to fill his Keppra prescription after recent discharge. He states that he feels jittery all over since last night. States he had similar symptoms when he had a seizure and was admitted to [facility] 01/06/2021.  Patient does not have unilateral numbness or tingling in his extremities, no facial droop or slurred speech.  Patient has mild dry cough, but denies chest pain, shortness of breath.  No fever or chills.  No nausea, vomiting, diarrhea, abdominal pain, symptoms of UTI.  He is followed by Dr.Raowith oncology. He had his second cycle of Keytruda on 01/05/2021. He has status post whole brain radiation. Last chemotherapy was in October 2021. Pt is cheduled for G-tube placement tomorrow due to poor oral intake, weight loss and protein calorie malnutrition.  ED Course: pt was found to have WBC 15.6, negative urinalysis, negative COVID PCR, potassium 2.7, renal function okay, temperature 97.4, blood pressure soft, heart rate 120, 100, 22, oxygen saturation 95% on room air.  Chest x-ray is negative for new infiltration  Assessment & Plan:   Principal Problem:   Seizures ([place]) Active Problems:   Hyperlipidemia   Type 2 diabetes mellitus without complication, without long-term current use of insulin (HCC)   Brain metastases (HCC)   Malignant neoplasm of lower lobe of right lung (HCC)   Chronic systolic CHF (congestive heart failure) (HCC)    Hypokalemia   Leucocytosis   Protein-calorie malnutrition, moderate (HCC)   Seizure (HCC)   HTN (hypertension)   Protein-calorie malnutrition, severe   Recurrent Seizures ([place]):  -Patient was recently hospitalized to [facility] 1/7-1/9 due to seizure which is 2/2 to 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  -Pt is supposed to take Keppra 500 mg bid, but he did not start this med secondary to barriers obtaining meds, per pt -Presents with recurrent seizure -Now on scheduled keppra and decadron 4mg  q6hrs -Remains seizure free -MRI brain reviewed with Neurology over phone. Pt noted to have an increase in size and heterogeneity of parasagittal L parietal lesion with new extensive surrounding edema. Case was discussed with Neurology -Remains without seizures   Protein-calorie malnutrition, moderate:  -Discussed with Surgery. Plan for G-tube placement 1/14 for supplemental nutrition -Discussed with Neurology who reviewed MRI with me over phone. Per Neurology, benefits to G tube placement may outweigh neurologic risk at this time -Continuing to tolerate PO, supplemental tube feeding per Nutrition -Tolerating tube feeding. Per dietitian, will likely need total 3-4 days for electrolytes to stabilize  Hyperlipidemia -pt was taking Zocor, but is not taking it currently  Type 2 diabetes mellitus without complication, without long-term current use of insulin ([place]):  -Recent A1c 5.7, well controlled.  Patient taking Actos -continue with SSI coverage as needed  Malignant neoplasm of lower lobe of right lung with bain metastases, also likely new liver and L subcostal soft tissue mets ([place]): - Continued with 4 mg decadron q6h -discussed with Surgery. Pt was found to have new  large hepatic mets over L lobe of liver with metastatic disease involving L subcostal soft tissue -Pt made aware of findings -pt to f/u [name] of oncology  Chronic systolic CHF (congestive heart failure) [facility]):   -Patient does not have leg edema or JVD.   -CHF seems to be compensated.  2D echo obtained/27/21 showed a EF of 30-35%.   -Patient is not taking diuretics.   -currently appears euvolemic  Hypokalemia: K= 2.7 on admission. - Was replaced - continue to follow electrolytes and correct as needed  Leucocytosis:  -Presenting WBC 15.6. No source of infection identified, possibly due to Decadron use. UA negative. CXR negative for new infiltration.  Procalcitonin level<0.10.  Patient received 1 dose of cefepime by ED. will not continue antibiotics -Follow-up of blood culture and urine culture -repeat cbc in AM  HTN: -Cont with Metoprolol as pt tolerates  DVT prophylaxis: Heparin subq Code Status: Full Family Communication: Pt in room, family not at bedside  Status is: Inpatient  The patient remains OBS appropriate and will d/c before 2 midnights.  Dispo: The patient is from: Home              Anticipated d/c is to: Home              Anticipated d/c date is: 2 days              Patient currently is not medically stable to d/c.   Consultants:   Oncology  General Surgery  Discussed case with Neurology  Procedures:     Antimicrobials: Anti-infectives (From admission, onward)   Start     Dose/Rate Route Frequency Ordered Stop   01/14/21 0600  cefoTEtan (CEFOTAN) 2 g in sodium chloride 0.9 % 100 mL IVPB  Status:  Discontinued        2 g 200 mL/hr over 30 Minutes Intravenous On call to O.R. 01/13/21 1415 01/13/21 1421   01/13/21 0930  ciprofloxacin (CIPRO) IVPB 400 mg  Status:  Discontinued        400 mg 200 mL/hr over 60 Minutes Intravenous On call to O.R. 01/13/21 6045 01/13/21 0827   01/13/21 0915  cefoTEtan (CEFOTAN) 2 g in sodium chloride 0.9 % 100 mL IVPB        2 g 200 mL/hr over 30 Minutes Intravenous On call to O.R. 01/13/21 0827 01/13/21 1016   01/13/21 0909  sodium chloride 0.9 % with cefoTEtan (CEFOTAN) ADS Med       Note to Pharmacy: [facility]   :  cabinet override      01/13/21 0909 01/13/21 0950   01/11/21 0815  ceFEPIme (MAXIPIME) 2 g in sodium chloride 0.9 % 100 mL IVPB        2 g 200 mL/hr over 30 Minutes Intravenous  Once 01/11/21 0802 01/11/21 0914      Subjective: Tolerating tube feeding. Denies abd pain  Objective: Vitals:   01/14/21 1628 01/14/21 2134 01/15/21 0501 01/15/21 0848  BP: 102/76 103/64 113/78 101/77  Pulse: 66 71 67 70  Resp: 16 18 18 16   Temp: (!) 97.5 F (36.4 C) (!) 97.5 F (36.4 C) 97.8 F (36.6 C) 97.9 F (36.6 C)  TempSrc:      SpO2: 90% 96% 95% 97%  Weight:      Height:        Intake/Output Summary (Last 24 hours) at 01/15/2021 1309 Last data filed at 01/15/2021 1123 Gross per 24 hour  Intake 920 ml  Output 2550 ml  Net -1630 ml   Filed Weights   01/11/21 0614  Weight: 72.6 kg    Examination: General exam: Awake, laying in bed, in nad Respiratory system: Normal respiratory effort, no wheezing Cardiovascular system: regular rate, s1, s2 Gastrointestinal system: Soft, nondistended, positive BS Central nervous system: CN2-12 grossly intact, strength intact Extremities: Perfused, no clubbing Skin: Normal skin turgor, no notable skin lesions seen Psychiatry: Mood normal // no visual hallucinations   Data Reviewed: I have personally reviewed following labs and imaging studies  CBC: Recent Labs  Lab 01/11/21 0659 01/12/21 0145  WBC 15.6* 10.0  NEUTROABS 13.2*  --   HGB 12.2* 11.0*  HCT 37.2* 34.2*  MCV 90.7 91.4  PLT 285 161   Basic Metabolic Panel: Recent Labs  Lab 01/11/21 0659 01/11/21 1549 01/12/21 0145 01/14/21 0644 01/15/21 0649  NA 136 137 135 141 140  K 2.7* 3.7 3.9 3.4* 3.3*  CL 96* 101 102 103 101  CO2 26 25 25 28 31   GLUCOSE 117* 108* 120* 104* 95  BUN 8 8 8 8 8   CREATININE 0.45* 0.50* 0.43* 0.38* 0.45*  CALCIUM 8.3* 8.5* 8.4* 8.2* 8.9  MG 1.9  --   --  2.0 2.0   GFR: Estimated Creatinine Clearance: 103.4 mL/min (A) (by C-G formula based on SCr of  0.45 mg/dL (L)). Liver Function Tests: Recent Labs  Lab 01/11/21 0659 01/14/21 0644 01/15/21 0649  AST 23 21 21   ALT 23 29 31   ALKPHOS 147* 136* 154*  BILITOT 1.1 0.7 0.5  PROT 6.4* 5.7* 5.8*  ALBUMIN 2.6* 2.6* 2.6*   No results for input(s): LIPASE, AMYLASE in the last 168 hours. No results for input(s): AMMONIA in the last 168 hours. Coagulation Profile: Recent Labs  Lab 01/11/21 0659  INR 1.2   Cardiac Enzymes: No results for input(s): CKTOTAL, CKMB, CKMBINDEX, TROPONINI in the last 168 hours. BNP (last 3 results) No results for input(s): PROBNP in the last 8760 hours. HbA1C: No results for input(s): HGBA1C in the last 72 hours. CBG: Recent Labs  Lab 01/13/21 2124 01/14/21 0838 01/14/21 1216 01/14/21 1704 01/14/21 2152  GLUCAP 117* 102* 156* 151* 149*   Lipid Profile: No results for input(s): CHOL, HDL, LDLCALC, TRIG, CHOLHDL, LDLDIRECT in the last 72 hours. Thyroid Function Tests: No results for input(s): TSH, T4TOTAL, FREET4, T3FREE, THYROIDAB in the last 72 hours. Anemia Panel: No results for input(s): VITAMINB12, FOLATE, FERRITIN, TIBC, IRON, RETICCTPCT in the last 72 hours. Sepsis Labs: Recent Labs  Lab 01/11/21 0659  PROCALCITON <0.10  LATICACIDVEN 1.5    Recent Results (from the past 240 hour(s))  SARS CORONAVIRUS 2 (TAT 6-24 HRS) Nasopharyngeal Nasopharyngeal Swab     Status: None   Collection Time: 01/06/21  3:18 PM   Specimen: Nasopharyngeal Swab  Result Value Ref Range Status   SARS Coronavirus 2 NEGATIVE NEGATIVE Final    Comment: (NOTE) SARS-CoV-2 target nucleic acids are NOT DETECTED.  The SARS-CoV-2 RNA is generally detectable in upper and lower respiratory specimens during the acute phase of infection. Negative results do not preclude SARS-CoV-2 infection, do not rule out co-infections with other pathogens, and should not be used as the sole basis for treatment or other patient management decisions. Negative results must be combined  with clinical observations, patient history, and epidemiological information. The expected result is Negative.  Fact Sheet for Patients: SugarRoll.be  Fact Sheet for Healthcare Providers: https://www.woods-mathews.com/  This test is not yet approved or cleared by the [place] FDA and  has been authorized for detection and/or diagnosis of SARS-CoV-2 by FDA under an Emergency Use Authorization (EUA). This EUA will remain  in effect (meaning this test can be used) for the duration of the COVID-19 declaration under Se ction 564(b)(1) of the Act, 21 U.S.C. section 360bbb-3(b)(1), unless the authorization is terminated or revoked sooner.  Performed at [facility], [place] [address], [place], [place] [address]   Urine culture     Status: Abnormal   Collection Time: 01/11/21  7:32 AM   Specimen: In/Out Cath Urine  Result Value Ref Range Status   Specimen Description   Final    IN/OUT CATH URINE Performed at [facility], [address] [place], [place] [address]    Special Requests   Final    NONE Performed at [facility], [address] [place], [place] [address]    Culture >=100,000 COLONIES/mL STAPHYLOCOCCUS EPIDERMIDIS (A)  Final   Report Status 01/14/2021 FINAL  Final   Organism ID, Bacteria STAPHYLOCOCCUS EPIDERMIDIS (A)  Final      Susceptibility   Staphylococcus epidermidis - MIC*    CIPROFLOXACIN >=8 RESISTANT Resistant     GENTAMICIN 8 INTERMEDIATE Intermediate     NITROFURANTOIN <=16 SENSITIVE Sensitive     OXACILLIN >=4 RESISTANT Resistant     TETRACYCLINE 2 SENSITIVE Sensitive     VANCOMYCIN 1 SENSITIVE Sensitive     TRIMETH/SULFA 80 RESISTANT Resistant     CLINDAMYCIN >=8 RESISTANT Resistant     RIFAMPIN <=0.5 SENSITIVE Sensitive     Inducible Clindamycin NEGATIVE Sensitive     * >=100,000 COLONIES/mL STAPHYLOCOCCUS EPIDERMIDIS  Resp Panel by RT-PCR (Flu A&B, Covid) Nasopharyngeal Swab      Status: None   Collection Time: 01/11/21  7:32 AM   Specimen: Nasopharyngeal Swab; Nasopharyngeal(NP) swabs in vial transport medium  Result Value Ref Range Status   SARS Coronavirus 2 by RT PCR NEGATIVE NEGATIVE Final    Comment: (NOTE) SARS-CoV-2 target nucleic acids are NOT DETECTED.  The SARS-CoV-2 RNA is generally detectable in upper respiratory specimens during the acute phase of infection. The lowest concentration of SARS-CoV-2 viral copies this assay can detect is 138 copies/mL. A negative result does not preclude SARS-Cov-2 infection and should not be used as the sole basis for treatment or other patient management decisions. A negative result may occur with  improper specimen collection/handling, submission of specimen other than nasopharyngeal swab, presence of viral mutation(s) within the areas targeted by this assay, and inadequate number of viral copies(<138 copies/mL). A negative result must be combined with clinical observations, patient history, and epidemiological information. The expected result is Negative.  Fact Sheet for Patients:  EntrepreneurPulse.com.au  Fact Sheet for Healthcare Providers:  IncredibleEmployment.be  This test is no t yet approved or cleared by the [place] FDA and  has been authorized for detection and/or diagnosis of SARS-CoV-2 by FDA under an Emergency Use Authorization (EUA). This EUA will remain  in effect (meaning this test can be used) for the duration of the COVID-19 declaration under Section 564(b)(1) of the Act, 21 U.S.C.section 360bbb-3(b)(1), unless the authorization is terminated  or revoked sooner.       Influenza A by PCR NEGATIVE NEGATIVE Final   Influenza B by PCR NEGATIVE NEGATIVE Final    Comment: (NOTE) The Xpert Xpress SARS-CoV-2/FLU/RSV plus assay is intended as an aid in the diagnosis of influenza from Nasopharyngeal swab specimens and should not be used as a sole basis  for treatment. Nasal washings  and aspirates are unacceptable for Xpert Xpress SARS-CoV-2/FLU/RSV testing.  Fact Sheet for Patients: EntrepreneurPulse.com.au  Fact Sheet for Healthcare Providers: IncredibleEmployment.be  This test is not yet approved or cleared by the [place] FDA and has been authorized for detection and/or diagnosis of SARS-CoV-2 by FDA under an Emergency Use Authorization (EUA). This EUA will remain in effect (meaning this test can be used) for the duration of the COVID-19 declaration under Section 564(b)(1) of the Act, 21 U.S.C. section 360bbb-3(b)(1), unless the authorization is terminated or revoked.  Performed at [facility], [address] [place], [place] [address]   Blood culture (routine x 2)     Status: None (Preliminary result)   Collection Time: 01/11/21  7:57 AM   Specimen: BLOOD  Result Value Ref Range Status   Specimen Description BLOOD BLOOD RIGHT ARM  Final   Special Requests   Final    BOTTLES DRAWN AEROBIC AND ANAEROBIC Blood Culture adequate volume   Culture   Final    NO GROWTH 4 DAYS Performed at [facility], [address] [place], [place] [address]    Report Status PENDING  Incomplete  Blood culture (routine x 2)     Status: None (Preliminary result)   Collection Time: 01/11/21  7:58 AM   Specimen: BLOOD  Result Value Ref Range Status   Specimen Description BLOOD BLOOD LEFT ARM  Final   Special Requests   Final    BOTTLES DRAWN AEROBIC AND ANAEROBIC Blood Culture adequate volume   Culture   Final    NO GROWTH 4 DAYS Performed at [facility], [address], [place], [place] [address]    Report Status PENDING  Incomplete     Radiology Studies: No results found.  Scheduled Meds: . acetaminophen  1,000 mg Per Tube Q6H  . Chlorhexidine Gluconate Cloth  6 each Topical Daily  . dexamethasone  4 mg Per Tube Q6H  . dronabinol  5 mg Oral BID AC  . feeding  supplement (OSMOLITE 1.5 CAL)  237 mL Per Tube QID  . [START ON 01/16/2021] feeding supplement (OSMOLITE 1.5 CAL)  237 mL Per Tube 6 X Daily  . heparin  5,000 Units Subcutaneous Q8H  . insulin aspart  0-5 Units Subcutaneous QHS  . insulin aspart  0-9 Units Subcutaneous TID WC  . metoprolol succinate  12.5 mg Oral Daily  . vitamin B-12  1,000 mcg Oral Daily   Continuous Infusions: . sodium chloride 10 mL/hr at 01/13/21 1654  . levETIRAcetam 500 mg (01/15/21 0240)     LOS: 2 days   Marylu Lund, MD [facility] Pager On Amion  If 7PM-7AM, please contact night-coverage 01/15/2021, 1:09 PM

## 2021-01-15 NOTE — Plan of Care (Signed)
  Problem: Education: Goal: Knowledge of General Education information will improve Description: Including pain rating scale, medication(s)/side effects and non-pharmacologic comfort measures Outcome: Progressing   Problem: Health Behavior/Discharge Planning: Goal: Ability to manage health-related needs will improve Outcome: Progressing   Problem: Clinical Measurements: Goal: Ability to maintain clinical measurements within normal limits will improve Outcome: Progressing Goal: Will remain free from infection Outcome: Progressing Goal: Diagnostic test results will improve Outcome: Progressing Goal: Respiratory complications will improve Outcome: Progressing Goal: Cardiovascular complication will be avoided Outcome: Progressing   Problem: Activity: Goal: Risk for activity intolerance will decrease Outcome: Progressing   Problem: Nutrition: Goal: Adequate nutrition will be maintained Outcome: Progressing   Problem: Coping: Goal: Level of anxiety will decrease Outcome: Progressing   Problem: Elimination: Goal: Will not experience complications related to bowel motility Outcome: Progressing Goal: Will not experience complications related to urinary retention Outcome: Progressing   Problem: Pain Managment: Goal: General experience of comfort will improve Outcome: Progressing   Problem: Safety: Goal: Ability to remain free from injury will improve Outcome: Progressing   Problem: Skin Integrity: Goal: Risk for impaired skin integrity will decrease Outcome: Progressing   Problem: Education: Goal: Ability to describe self-care measures that may prevent or decrease complications (Diabetes Survival Skills Education) will improve Outcome: Progressing Goal: Individualized Educational Video(s) Outcome: Progressing   Problem: Coping: Goal: Ability to adjust to condition or change in health will improve Outcome: Progressing   Problem: Fluid Volume: Goal: Ability to  maintain a balanced intake and output will improve Outcome: Progressing   Problem: Health Behavior/Discharge Planning: Goal: Ability to identify and utilize available resources and services will improve Outcome: Progressing Goal: Ability to manage health-related needs will improve Outcome: Progressing   Problem: Metabolic: Goal: Ability to maintain appropriate glucose levels will improve Outcome: Progressing   Problem: Nutritional: Goal: Maintenance of adequate nutrition will improve Outcome: Progressing Goal: Progress toward achieving an optimal weight will improve Outcome: Progressing   Problem: Skin Integrity: Goal: Risk for impaired skin integrity will decrease Outcome: Progressing   Problem: Tissue Perfusion: Goal: Adequacy of tissue perfusion will improve Outcome: Progressing   Problem: Education: Goal: Knowledge of disease or condition will improve Outcome: Progressing Goal: Knowledge of secondary prevention will improve Outcome: Progressing Goal: Knowledge of patient specific risk factors addressed and post discharge goals established will improve Outcome: Progressing Goal: Individualized Educational Video(s) Outcome: Progressing   Problem: Self-Care: Goal: Ability to participate in self-care as condition permits will improve Outcome: Progressing Goal: Verbalization of feelings and concerns over difficulty with self-care will improve Outcome: Progressing Goal: Ability to communicate needs accurately will improve Outcome: Progressing   Problem: Nutrition: Goal: Risk of aspiration will decrease Outcome: Progressing

## 2021-01-16 ENCOUNTER — Inpatient Hospital Stay: Payer: Medicare HMO

## 2021-01-16 ENCOUNTER — Ambulatory Visit: Payer: Medicare HMO | Attending: Radiation Oncology | Admitting: Radiation Oncology

## 2021-01-16 DIAGNOSIS — I5022 Chronic systolic (congestive) heart failure: Secondary | ICD-10-CM | POA: Diagnosis not present

## 2021-01-16 DIAGNOSIS — R531 Weakness: Secondary | ICD-10-CM | POA: Diagnosis not present

## 2021-01-16 DIAGNOSIS — R569 Unspecified convulsions: Secondary | ICD-10-CM | POA: Diagnosis not present

## 2021-01-16 LAB — GLUCOSE, CAPILLARY
Glucose-Capillary: 122 mg/dL — ABNORMAL HIGH (ref 70–99)
Glucose-Capillary: 119 mg/dL — ABNORMAL HIGH (ref 70–99)
Glucose-Capillary: 187 mg/dL — ABNORMAL HIGH (ref 70–99)
Glucose-Capillary: 45 mg/dL — ABNORMAL LOW (ref 70–99)
Glucose-Capillary: 134 mg/dL — ABNORMAL HIGH (ref 70–99)
Glucose-Capillary: 116 mg/dL — ABNORMAL HIGH (ref 70–99)
Glucose-Capillary: 111 mg/dL — ABNORMAL HIGH (ref 70–99)
Glucose-Capillary: 159 mg/dL — ABNORMAL HIGH (ref 70–99)

## 2021-01-16 LAB — COMPREHENSIVE METABOLIC PANEL
ALT: 35 U/L (ref 0–44)
AST: 21 U/L (ref 15–41)
Albumin: 2.6 g/dL — ABNORMAL LOW (ref 3.5–5.0)
Alkaline Phosphatase: 172 U/L — ABNORMAL HIGH (ref 38–126)
Anion gap: 8 (ref 5–15)
BUN: 8 mg/dL (ref 6–20)
CO2: 30 mmol/L (ref 22–32)
Calcium: 8.6 mg/dL — ABNORMAL LOW (ref 8.9–10.3)
Chloride: 101 mmol/L (ref 98–111)
Creatinine, Ser: 0.41 mg/dL — ABNORMAL LOW (ref 0.61–1.24)
GFR, Estimated: >60 mL/min (ref >60)
Glucose, Bld: 113 mg/dL — ABNORMAL HIGH (ref 70–99)
Potassium: 3.9 mmol/L (ref 3.5–5.1)
Sodium: 139 mmol/L (ref 135–145)
Total Bilirubin: 0.6 mg/dL (ref 0.3–1.2)
Total Protein: 6.1 g/dL — ABNORMAL LOW (ref 6.5–8.1)

## 2021-01-16 LAB — CULTURE, BLOOD (ROUTINE X 2)
Culture: NO GROWTH
Culture: NO GROWTH
Special Requests: ADEQUATE
Special Requests: ADEQUATE

## 2021-01-16 LAB — PHOSPHORUS: Phosphorus: 1.7 mg/dL — ABNORMAL LOW (ref 2.5–4.6)

## 2021-01-16 LAB — MAGNESIUM: Magnesium: 1.9 mg/dL (ref 1.7–2.4)

## 2021-01-16 MED ORDER — LEVETIRACETAM 500 MG PO TABS
500.0000 mg | ORAL_TABLET | Freq: Two times a day (BID) | ORAL | Status: DC
  Administered 2021-01-16 – 2021-01-17 (×2): 500 mg via ORAL
  Filled 2021-01-16 (×3): qty 1

## 2021-01-16 MED ORDER — K PHOS MONO-SOD PHOS DI & MONO 155-852-130 MG PO TABS
500.0000 mg | ORAL_TABLET | Freq: Once | ORAL | Status: AC
  Administered 2021-01-16: 500 mg via ORAL
  Filled 2021-01-16: qty 2

## 2021-01-16 MED ORDER — POLYETHYLENE GLYCOL 3350 17 G PO PACK
17.0000 g | PACK | Freq: Every day | ORAL | Status: DC
  Administered 2021-01-16: 17 g via ORAL
  Filled 2021-01-16 (×2): qty 1

## 2021-01-16 MED ORDER — OSMOLITE 1.5 CAL PO LIQD
237.0000 mL | ORAL | Status: DC
  Administered 2021-01-17 (×2): 237 mL

## 2021-01-16 NOTE — Progress Notes (Signed)
PROGRESS NOTE    Tyler Gallagher  ZOX:[id number] DOB: August 09, 1961 DOA: 01/11/2021 PCP: Juluis Pitch, MD    Brief Narrative:  60 y.o. 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 Pt was discharged on Keppra 500 mg bid and decadron 4 mg tid. Pt states that he was unable to fill his Keppra prescription after recent discharge. He states that he feels jittery all over since last night. States he had similar symptoms when he had a seizure and was admitted to [facility] 01/06/2021.  Patient does not have unilateral numbness or tingling in his extremities, no facial droop or slurred speech.  Patient has mild dry cough, but denies chest pain, shortness of breath.  No fever or chills.  No nausea, vomiting, diarrhea, abdominal pain, symptoms of UTI.  He is followed by Dr.Raowith oncology. He had his second cycle of Keytruda on 01/05/2021. He has status post whole brain radiation. Last chemotherapy was in October 2021. Pt is cheduled for G-tube placement tomorrow due to poor oral intake, weight loss and protein calorie malnutrition.  ED Course: pt was found to have WBC 15.6, negative urinalysis, negative COVID PCR, potassium 2.7, renal function okay, temperature 97.4, blood pressure soft, heart rate 120, 100, 22, oxygen saturation 95% on room air.  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  -Pt is supposed to take Keppra 500 mg bid, but he did not start this med secondary to barriers obtaining meds, per pt -Presents with recurrent seizure -Now on scheduled keppra and decadron 4mg  q6hrs -Remains seizure free -MRI brain reviewed with Neurology over phone. Pt noted to have an increase in size and heterogeneity of parasagittal L parietal lesion with new extensive surrounding edema. Case was discussed with Neurology -Remains without seizures at this time   Protein-calorie malnutrition, moderate:  -Discussed with Surgery. Plan for G-tube placement 1/14 for supplemental nutrition -Discussed with Neurology who reviewed MRI with me over phone. Per Neurology, benefits to G tube placement may outweigh neurologic risk at this time -Continuing to tolerate PO, supplemental tube feeding per Nutrition -Tolerating tube feeding. Gradually increasing tube feed to goal per dietitian, anticipate reaching goal by tomorrow  Hyperlipidemia -pt was taking Zocor, but is not taking it currently  Type 2 diabetes mellitus without complication, without long-term current use of insulin ([place]):  -Recent A1c 5.7, well controlled.  Patient taking Actos -continue with SSI coverage as needed  Malignant neoplasm of lower lobe of right lung with bain metastases, also likely new liver and L subcostal soft tissue mets ([place]): - Continued with 4 mg decadron q6h -discussed with Surgery. Pt was  found to have new large hepatic mets over L lobe of liver with metastatic disease involving L subcostal soft tissue -Pt made aware of findings -pt to f/u [name] of oncology -Pt seen by Rad Onc. Rec to f/u in 2  months or sooner as needed  Chronic systolic CHF (congestive heart failure) ([place]):  -Patient does not have leg edema or JVD.   -CHF seems to be compensated.  2D echo obtained/27/21 showed a EF of 30-35%.   -Patient is not taking diuretics.   -currently appears euvolemic  Hypokalemia: K= 2.7 on admission. - Was replaced - continue to follow electrolytes and correct as needed  Leucocytosis:  -Presenting WBC 15.6. No source of infection identified, possibly due to Decadron use. UA negative. CXR negative for new infiltration.  Procalcitonin level<0.10.  Patient received 1 dose of cefepime by ED. will not continue antibiotics -Staph epi noted in urine, however pt is asymptomatic. Therefore asymptomatic bacturia for which abx will cont to be held  HTN: -Cont with Metoprolol as pt tolerates  DVT prophylaxis: Heparin subq Code Status: Full Family Communication: Pt in room, family not at bedside  Status is: Inpatient  The patient remains OBS appropriate and will d/c before 2 midnights.  Dispo: The patient is from: Home              Anticipated d/c is to: Home              Anticipated d/c date is: 2 days              Patient currently is not medically stable to d/c.   Consultants:   Oncology  General Surgery  Discussed case with Neurology  Procedures:     Antimicrobials: Anti-infectives (From admission, onward)   Start     Dose/Rate Route Frequency Ordered Stop   01/14/21 0600  cefoTEtan (CEFOTAN) 2 g in sodium chloride 0.9 % 100 mL IVPB  Status:  Discontinued        2 g 200 mL/hr over 30 Minutes Intravenous On call to O.R. 01/13/21 1415 01/13/21 1421   01/13/21 0930  ciprofloxacin (CIPRO) IVPB 400 mg  Status:  Discontinued        400 mg 200 mL/hr over 60 Minutes Intravenous On call to O.R. 01/13/21 8676 01/13/21 0827   01/13/21 0915  cefoTEtan (CEFOTAN) 2 g in sodium chloride 0.9 % 100 mL IVPB        2 g 200 mL/hr over 30 Minutes Intravenous On call to O.R. 01/13/21  0827 01/13/21 1016   01/13/21 0909  sodium chloride 0.9 % with cefoTEtan (CEFOTAN) ADS Med       Note to Pharmacy: [facility]   : cabinet override      01/13/21 0909 01/13/21 0950   01/11/21 0815  ceFEPIme (MAXIPIME) 2 g in sodium chloride 0.9 % 100 mL IVPB        2 g 200 mL/hr over 30 Minutes Intravenous  Once 01/11/21 0802 01/11/21 0914      Subjective: Without complaints. Eager to go home soon  Objective: Vitals:   01/15/21 0501 01/15/21 0848 01/15/21 1613 01/16/21 0740  BP: 113/78 101/77 113/81 114/78  Pulse: 67 70 69 85  Resp: 18 16 16 16   Temp: 97.8 F (36.6 C) 97.9 F (36.6 C) 98.2 F (36.8 C) (!) 97.5 F (36.4 C)  TempSrc:    Oral  SpO2: 95% 97% 97% 97%  Weight:      Height:  Intake/Output Summary (Last 24 hours) at 01/16/2021 1521 Last data filed at 01/16/2021 1318 Gross per 24 hour  Intake 0 ml  Output 1550 ml  Net -1550 ml   Filed Weights   01/11/21 0626  Weight: 72.6 kg    Examination: General exam: Conversant, in no acute distress Respiratory system: normal chest rise, clear, no audible wheezing Cardiovascular system: regular rhythm, s1-s2 Gastrointestinal system: Nondistended, nontender, pos BS, feeding tube in place Central nervous system: No seizures, no tremors Extremities: No cyanosis, no joint deformities Skin: No rashes, no pallor Psychiatry: Affect normal // no auditory hallucinations   Data Reviewed: I have personally reviewed following labs and imaging studies  CBC: Recent Labs  Lab 01/11/21 0659 01/12/21 0145  WBC 15.6* 10.0  NEUTROABS 13.2*  --   HGB 12.2* 11.0*  HCT 37.2* 34.2*  MCV 90.7 91.4  PLT 285 948   Basic Metabolic Panel: Recent Labs  Lab 01/11/21 0659 01/11/21 1549 01/12/21 0145 01/14/21 0644 01/15/21 0649 01/16/21 0453  NA 136 137 135 141 140 139  K 2.7* 3.7 3.9 3.4* 3.3* 3.9  CL 96* 101 102 103 101 101  CO2 26 25 25 28 31 30   GLUCOSE 117* 108* 120* 104* 95 113*  BUN 8 8 8 8 8 8   CREATININE  0.45* 0.50* 0.43* 0.38* 0.45* 0.41*  CALCIUM 8.3* 8.5* 8.4* 8.2* 8.9 8.6*  MG 1.9  --   --  2.0 2.0 1.9  PHOS  --   --   --   --   --  1.7*   GFR: Estimated Creatinine Clearance: 103.4 mL/min (A) (by C-G formula based on SCr of 0.41 mg/dL (L)). Liver Function Tests: Recent Labs  Lab 01/11/21 0659 01/14/21 0644 01/15/21 0649 01/16/21 0453  AST 23 21 21 21   ALT 23 29 31  35  ALKPHOS 147* 136* 154* 172*  BILITOT 1.1 0.7 0.5 0.6  PROT 6.4* 5.7* 5.8* 6.1*  ALBUMIN 2.6* 2.6* 2.6* 2.6*   No results for input(s): LIPASE, AMYLASE in the last 168 hours. No results for input(s): AMMONIA in the last 168 hours. Coagulation Profile: Recent Labs  Lab 01/11/21 0659  INR 1.2   Cardiac Enzymes: No results for input(s): CKTOTAL, CKMB, CKMBINDEX, TROPONINI in the last 168 hours. BNP (last 3 results) No results for input(s): PROBNP in the last 8760 hours. HbA1C: No results for input(s): HGBA1C in the last 72 hours. CBG: Recent Labs  Lab 01/15/21 1712 01/15/21 2148 01/16/21 0741 01/16/21 0832 01/16/21 1206  GLUCAP 187* 208* 45* 134* 116*   Lipid Profile: No results for input(s): CHOL, HDL, LDLCALC, TRIG, CHOLHDL, LDLDIRECT in the last 72 hours. Thyroid Function Tests: No results for input(s): TSH, T4TOTAL, FREET4, T3FREE, THYROIDAB in the last 72 hours. Anemia Panel: No results for input(s): VITAMINB12, FOLATE, FERRITIN, TIBC, IRON, RETICCTPCT in the last 72 hours. Sepsis Labs: Recent Labs  Lab 01/11/21 0659  PROCALCITON <0.10  LATICACIDVEN 1.5    Recent Results (from the past 240 hour(s))  Urine culture     Status: Abnormal   Collection Time: 01/11/21  7:32 AM   Specimen: In/Out Cath Urine  Result Value Ref Range Status   Specimen Description   Final    IN/OUT CATH URINE Performed at [facility], [address], [place], [place] [address]    Special Requests   Final    NONE Performed at [facility], [address], [place], [place] [address]     Culture >=100,000 COLONIES/mL STAPHYLOCOCCUS EPIDERMIDIS (A)  Final   Report Status 01/14/2021 FINAL  Final   Organism ID, Bacteria STAPHYLOCOCCUS EPIDERMIDIS (A)  Final      Susceptibility   Staphylococcus epidermidis - MIC*    CIPROFLOXACIN >=8 RESISTANT Resistant     GENTAMICIN 8 INTERMEDIATE Intermediate     NITROFURANTOIN <=16 SENSITIVE Sensitive     OXACILLIN >=4 RESISTANT Resistant     TETRACYCLINE 2 SENSITIVE Sensitive     VANCOMYCIN 1 SENSITIVE Sensitive     TRIMETH/SULFA 80 RESISTANT Resistant     CLINDAMYCIN >=8 RESISTANT Resistant     RIFAMPIN <=0.5 SENSITIVE Sensitive     Inducible Clindamycin NEGATIVE Sensitive     * >=100,000 COLONIES/mL STAPHYLOCOCCUS EPIDERMIDIS  Resp Panel by RT-PCR (Flu A&B, Covid) Nasopharyngeal Swab     Status: None   Collection Time: 01/11/21  7:32 AM   Specimen: Nasopharyngeal Swab; Nasopharyngeal(NP) swabs in vial transport medium  Result Value Ref Range Status   SARS Coronavirus 2 by RT PCR NEGATIVE NEGATIVE Final    Comment: (NOTE) SARS-CoV-2 target nucleic acids are NOT DETECTED.  The SARS-CoV-2 RNA is generally detectable in upper respiratory specimens during the acute phase of infection. The lowest concentration of SARS-CoV-2 viral copies this assay can detect is 138 copies/mL. A negative result does not preclude SARS-Cov-2 infection and should not be used as the sole basis for treatment or other patient management decisions. A negative result may occur with  improper specimen collection/handling, submission of specimen other than nasopharyngeal swab, presence of viral mutation(s) within the areas targeted by this assay, and inadequate number of viral copies(<138 copies/mL). A negative result must be combined with clinical observations, patient history, and epidemiological information. The expected result is Negative.  Fact Sheet for Patients:  EntrepreneurPulse.com.au  Fact Sheet for Healthcare Providers:   IncredibleEmployment.be  This test is no t yet approved or cleared by the [place] FDA and  has been authorized for detection and/or diagnosis of SARS-CoV-2 by FDA under an Emergency Use Authorization (EUA). This EUA will remain  in effect (meaning this test can be used) for the duration of the COVID-19 declaration under Section 564(b)(1) of the Act, 21 U.S.C.section 360bbb-3(b)(1), unless the authorization is terminated  or revoked sooner.       Influenza A by PCR NEGATIVE NEGATIVE Final   Influenza B by PCR NEGATIVE NEGATIVE Final    Comment: (NOTE) The Xpert Xpress SARS-CoV-2/FLU/RSV plus assay is intended as an aid in the diagnosis of influenza from Nasopharyngeal swab specimens and should not be used as a sole basis for treatment. Nasal washings and aspirates are unacceptable for Xpert Xpress SARS-CoV-2/FLU/RSV testing.  Fact Sheet for Patients: EntrepreneurPulse.com.au  Fact Sheet for Healthcare Providers: IncredibleEmployment.be  This test is not yet approved or cleared by the [place] FDA and has been authorized for detection and/or diagnosis of SARS-CoV-2 by FDA under an Emergency Use Authorization (EUA). This EUA will remain in effect (meaning this test can be used) for the duration of the COVID-19 declaration under Section 564(b)(1) of the Act, 21 U.S.C. section 360bbb-3(b)(1), unless the authorization is terminated or revoked.  Performed at [facility], [address] [place], [place] [address]   Blood culture (routine x 2)     Status: None   Collection Time: 01/11/21  7:57 AM   Specimen: BLOOD  Result Value Ref Range Status   Specimen Description BLOOD BLOOD RIGHT ARM  Final   Special Requests   Final    BOTTLES DRAWN AEROBIC AND ANAEROBIC Blood  Culture adequate volume   Culture   Final    NO GROWTH 5 DAYS Performed at [facility], [address] [place], [place]  [address]    Report Status 01/16/2021 FINAL  Final  Blood culture (routine x 2)     Status: None   Collection Time: 01/11/21  7:58 AM   Specimen: BLOOD  Result Value Ref Range Status   Specimen Description BLOOD BLOOD LEFT ARM  Final   Special Requests   Final    BOTTLES DRAWN AEROBIC AND ANAEROBIC Blood Culture adequate volume   Culture   Final    NO GROWTH 5 DAYS Performed at [facility], [address], [place], [place] [address]    Report Status 01/16/2021 FINAL  Final     Radiology Studies: No results found.  Scheduled Meds: . acetaminophen  1,000 mg Per Tube Q6H  . Chlorhexidine Gluconate Cloth  6 each Topical Daily  . dexamethasone  4 mg Per Tube Q6H  . dronabinol  5 mg Oral BID AC  . feeding supplement (OSMOLITE 1.5 CAL)  237 mL Per Tube 6 X Daily  . [START ON 01/17/2021] feeding supplement (OSMOLITE 1.5 CAL)  237 mL Per Tube Q3H  . heparin  5,000 Units Subcutaneous Q8H  . insulin aspart  0-5 Units Subcutaneous QHS  . insulin aspart  0-9 Units Subcutaneous TID WC  . metoprolol succinate  12.5 mg Oral Daily  . polyethylene glycol  17 g Oral Daily  . vitamin B-12  1,000 mcg Oral Daily   Continuous Infusions: . sodium chloride 10 mL/hr at 01/15/21 1626  . levETIRAcetam 500 mg (01/15/21 1859)     LOS: 3 days   Marylu Lund, MD [facility] Pager On Amion  If 7PM-7AM, please contact night-coverage 01/16/2021, 3:21 PM

## 2021-01-16 NOTE — Progress Notes (Signed)
Radiation Oncology Follow up Note  Name: Tyler Gallagher   Date:   01/11/2021 MRN:  [id number] DOB: 1961/05/28    This 60 y.o. male presents during hospital admission for possible progressive metastatic disease to the brain and patient with known stage IV adenocarcinoma the right lung  REFERRING PROVIDER: No ref. provider found  HPI: Patient is a 60 year old male who is completed resection of a solitary brain metastasis in the left parasagittal region of the brain status post whole brain radiation therapy followed by boost.  Patient also received concurrent chemotherapy with radiation for adenocarcinoma of the right lung..  Patient also has known liver metastasis.  Recently hospitalized for seizure.  Patient has been on Keytruda.  He is currently hospitalized for leukocytosis with a WBC of 15.6 with no source of infection identified has been on Decadron.  Having some slight headaches.  Patient also has known congestive heart failure which is compensated.  Patient had MRI of his brain on the seventh showing increase in size and heterogeneity of parasagittal left parietal lesion with extensive surrounding edema.  This likely represents progression of metastatic disease however given history of radiation disproportion edema and appearance of enhancement treatment effect is a consideration.  Patient is scheduled for potential discharge tomorrow.  COMPLICATIONS OF TREATMENT: none  FOLLOW UP COMPLIANCE: keeps appointments   PHYSICAL EXAM:  BP 114/78 (BP Location: Left Arm)   Pulse 85   Temp (!) 97.5 F (36.4 C) (Oral)   Resp 16   Ht 6\' 3"  (1.905 m)   Wt 160 lb (72.6 kg)   SpO2 97%   BMI 20.00 kg/m No current focal do not neurologic deficit are noted.  No change in crude visual fields are noted Well-developed well-nourished patient in NAD. HEENT reveals PERLA, EOMI, discs not visualized.  Oral cavity is clear. No oral mucosal lesions are identified. Neck is clear without evidence of cervical or  supraclavicular adenopathy. Lungs are clear to A&P. Cardiac examination is essentially unremarkable with regular rate and rhythm without murmur rub or thrill. Abdomen is benign with no organomegaly or masses noted. Motor sensory and DTR levels are equal and symmetric in the upper and lower extremities. Cranial nerves II through XII are grossly intact. Proprioception is intact. No peripheral adenopathy or edema is identified. No motor or sensory levels are noted. Crude visual fields are within normal range.  RADIOLOGY RESULTS: MRI scan CT scans of chest abdomen pelvis all reviewed compatible with above-stated findings  PLAN: At this time this area of presumed progression of disease received high-dose radiation therapy in form of whole brain radiation therapy plus boost.  I am definitively not sure this is progression of disease.  2 decrease the chance of empirically treating with further radiation and would rather await and repeat his MRI scan in 2 months.  Tyler Gallagher is touchup this area with a small boost type field should this area show progression.  Patient is also multiple medical problems as listed above including stage IV lung cancer with liver lung and brain metastasis makes his prognosis extremely guarded.  I have set up a follow-up in 2 months.  We will be happy to reevaluate the patient she sooner should further neurologic decline occur.  I would like to take this opportunity to thank you for allowing me to participate in the care of your patient.Tyler Filbert, MD

## 2021-01-16 NOTE — TOC Initial Note (Addendum)
Transition of Care [facility]) - Initial/Assessment Note    Patient Details  Name: Tyler Gallagher MRN: [id number] Date of Birth: 08/06/1961  Transition of Care [facility]) CM/SW Contact:    Magnus Ivan, LCSW Phone Number: 01/16/2021, 9:54 AM  Clinical Narrative:               CSW working remotely. Attempted call to patient's room x 2 for Readmission Screening. Unable to reach patient. Called patient's cell # listed, spoke to significant other Rosalyn. Karie Kirks is aware patient will DC home on tube feeds and declines home health. She said she is a CNA and will be able to assist at home. Karie Kirks reported patient lives with her, his son, and his son's girlfriend. Karie Kirks provides transportation. PCP is Dr. Lovie Macadamia. [facility] and Karie Kirks denied issues with patient obtaining his medications. Patient goes to the [facility] who set up his tube feed supplies already through [place]. Previous TOC note said hospital bed, HH, and 3 in 1 were ordered, Rosalyn reported they refused this DME, have no room for a hospital bed, but would like a 3 in 1 to be ordered this hospitalization. CSW made referral to [facility] and asked MD for order.    Expected Discharge Plan: Home/Self Care Barriers to Discharge: Continued Medical Work up   Patient Goals and CMS Choice Patient states their goals for this hospitalization and ongoing recovery are:: to return home CMS Medicare.gov Compare Post Acute Care list provided to:: Patient Represenative (must comment) Choice offered to / list presented to : Spouse  Expected Discharge Plan and Services Expected Discharge Plan: Home/Self Care       Living arrangements for the past 2 months: Single Family Home                                      Prior Living Arrangements/Services Living arrangements for the past 2 months: Single Family Home Lives with:: Adult Children,Significant Other Patient language and need for interpreter  reviewed:: Yes Do you feel safe going back to the place where you live?: Yes      Need for Family Participation in Patient Care: Yes (Comment) Care giver support system in place?: Yes (comment)   Criminal Activity/Legal Involvement Pertinent to Current Situation/Hospitalization: No - Comment as needed  Activities of Daily Living Home Assistive Devices/Equipment: Wheelchair ADL Screening (condition at time of admission) Patient's cognitive ability adequate to safely complete daily activities?: Yes Is the patient deaf or have difficulty hearing?: No Does the patient have difficulty seeing, even when wearing glasses/contacts?: No Does the patient have difficulty concentrating, remembering, or making decisions?: No Patient able to express need for assistance with ADLs?: Yes Does the patient have difficulty dressing or bathing?: No Independently performs ADLs?: No Dressing (OT): Needs assistance Is this a change from baseline?: Pre-admission baseline Grooming: Needs assistance Is this a change from baseline?: Pre-admission baseline Feeding: Independent Bathing: Needs assistance Is this a change from baseline?: Pre-admission baseline Toileting: Needs assistance Is this a change from baseline?: Pre-admission baseline In/Out Bed: Needs assistance Is this a change from baseline?: Pre-admission baseline Walks in Home: Needs assistance Is this a change from baseline?: Pre-admission baseline Does the patient have difficulty walking or climbing stairs?: Yes Weakness of Legs: Both Weakness of Arms/Hands: Right  Permission Sought/Granted Permission sought to share information with : Facility Sport and exercise psychologist Permission granted to share information with :  Yes, Verbal Permission Granted              Emotional Assessment         Alcohol / Substance Use: Not Applicable Psych Involvement: No (comment)  Admission diagnosis:  Hypokalemia [E87.6] Seizure ([place]) [R56.9] Generalized  weakness [R53.1] Leukocytosis, unspecified type [D72.829] Patient Active Problem List   Diagnosis Date Noted  . Protein-calorie malnutrition, severe 01/12/2021  . Seizure ([place]) 01/11/2021  . HTN (hypertension) 01/11/2021  . Seizures ([place])   . Leucocytosis   . Protein-calorie malnutrition, moderate ([place])   . Generalized weakness   . Dizziness 11/08/2020  . Vertigo 11/08/2020  . Chronic systolic CHF (congestive heart failure) ([place]) 11/08/2020  . Hypomagnesemia 11/08/2020  . Hypotension 11/08/2020  . Hypokalemia 11/08/2020  . Nausea vomiting and diarrhea 11/08/2020  . Anxiety 11/08/2020  . Palliative care encounter   . Chemotherapy induced neutropenia ([place]) 09/29/2020  . Goals of care, counseling/discussion 07/08/2020  . Malignant neoplasm of lower lobe of right lung ([place]) 07/08/2020  . Brain metastases ([place]) 06/22/2020  . Vasogenic brain edema ([place]) 06/21/2020  . Hyperlipidemia 01/02/2016  . Hypertriglyceridemia 01/02/2016  . Tobacco abuse 01/02/2016  . Type 2 diabetes mellitus without complication, without long-term current use of insulin ([place]) 01/02/2016  . Obesity 01/02/2016  . S/P AKA (above knee amputation) unilateral ([place]) 01/02/2016  . Amaurosis fugax 01/01/2016  . TIA (transient ischemic attack) 01/01/2016   PCP:  Juluis Pitch, MD Pharmacy[facility]   [id number] [place] [address] [address] Phone: [phone] Fax: [phone]     Social Determinants of Health (SDOH) Interventions    Readmission Risk Interventions Readmission Risk Prevention Plan 01/16/2021  Transportation Screening Complete  PCP or Specialist Appt within 3-5 Days Complete  HRI or [place] Complete  Social Work Consult for [place] Planning/Counseling Complete  Palliative Care Screening Not Applicable  Medication Review Press photographer) Complete  Some recent data might be hidden

## 2021-01-16 NOTE — Progress Notes (Signed)
Nutrition Follow-up  RD working remotely.  DOCUMENTATION CODES:   Severe malnutrition in context of chronic illness  INTERVENTION:  Today provide Osmolite 1.5 Cal 1 carton (237 mL) 6 times daily per tube.  Tomorrow advance to goal regimen of Osmolite 1.5 Cal 1 carton (237 mL) 8 times daily per tube. Provides 2840 kcal, 119 grams of protein, 1448 mL H2O daily.  Provide free water flush of 30 mL before and after each bolus feeding. Provides a total of 1928 mL H2O daily including water in tube feeding. Patient also drinking liquids by mouth.   NUTRITION DIAGNOSIS:   Severe Malnutrition related to chronic illness (stage IV lung cancer with brain mets, CHF) as evidenced by severe fat depletion,severe muscle depletion,energy intake < or equal to 75% for > or equal to 1 month.  Ongoing.  GOAL:   Patient will meet greater than or equal to 90% of their needs  Progressing with advancement of tube feeds.  MONITOR:   PO intake,Labs,Weight trends,TF tolerance,I & O's  REASON FOR ASSESSMENT:   Malnutrition Screening Tool,Consult Assessment of nutrition requirement/status  ASSESSMENT:   60 year old male with PMHx of HTN, DM, HLD, CHF, depression, seizures, hx left AKA stage IV lung cancer s/p chemotherapy, resection of brain mass 7/021, whole brain radiation currenty on Keytruda recently admitted to [facility] 1/7-1/10 with seizure-like activity related to new brain metastases now admitted with seizures.  1/14 s/p placement of G-tube by surgeon  Attempted to call patient's room phone but he was unable to answer. Called front desk to speak with RN but RN unavailable at that time. Attempted to call patient's mobile number listed in chart but patient unable to answer and voice mailbox has not been set up. Per MD note from yesterday patient is tolerating tube feeding. Patient with hypophosphatemia today but is scheduled for repletion by MD. Per chart patient able to take some PO by mouth.  Unable to clarify exactly how much as unable to reach patient at this time.  Medications reviewed and include: Decadron 4 mg Q6hrs per tube, Marinol 5 mg BID before meals, Novolog 0-9 units TID, Novolog 0-5 units QHS, K Phos Neutral tab 500 mg once PO, Miralax, vitamin B12 1000 micrograms daily, Keppra.  Labs reviewed: CBG 45-208, Creatinine 0.41, Phosphorus 1.7.  Enteral Access: 18 Fr. G-tube placed 1/14  Discussed patient with [employer] rep Linsey over the phone today. Since regimen is >2500 kcal may need a letter of medical necessity. Plan is for Linsey and this RD to follow-up with patient's outpatient RD when she is back in the office tomorrow to determine plan for home TF regimen.  Diet Order:   Diet Order            Diet regular Room service appropriate? Yes; Fluid consistency: Thin  Diet effective now                EDUCATION NEEDS:   No education needs have been identified at this time  Skin:  Skin Assessment: Reviewed RN Assessment  Last BM:  01/12/2020 per chart  Height:   Ht Readings from Last 1 Encounters:  01/11/21 6\' 3"  (1.905 m)   Weight:   Wt Readings from Last 1 Encounters:  01/11/21 72.6 kg   BMI:  Body mass index is 20 kg/m.  Estimated Nutritional Needs:   Kcal:  0240-9735  Protein:  120-140 grams  Fluid:  >/= 2 L/day  Jacklynn Barnacle, MS, RD, LDN Pager number available on Amion

## 2021-01-17 DIAGNOSIS — I5022 Chronic systolic (congestive) heart failure: Secondary | ICD-10-CM | POA: Diagnosis not present

## 2021-01-17 DIAGNOSIS — R569 Unspecified convulsions: Secondary | ICD-10-CM | POA: Diagnosis not present

## 2021-01-17 LAB — COMPREHENSIVE METABOLIC PANEL
ALT: 44 U/L (ref 0–44)
AST: 33 U/L (ref 15–41)
Albumin: 2.7 g/dL — ABNORMAL LOW (ref 3.5–5.0)
Alkaline Phosphatase: 218 U/L — ABNORMAL HIGH (ref 38–126)
Anion gap: 9 (ref 5–15)
BUN: 13 mg/dL (ref 6–20)
CO2: 27 mmol/L (ref 22–32)
Calcium: 8.7 mg/dL — ABNORMAL LOW (ref 8.9–10.3)
Chloride: 102 mmol/L (ref 98–111)
Creatinine, Ser: <0.3 mg/dL — ABNORMAL LOW (ref 0.61–1.24)
Glucose, Bld: 123 mg/dL — ABNORMAL HIGH (ref 70–99)
Potassium: 4.2 mmol/L (ref 3.5–5.1)
Sodium: 138 mmol/L (ref 135–145)
Total Bilirubin: 0.7 mg/dL (ref 0.3–1.2)
Total Protein: 6.2 g/dL — ABNORMAL LOW (ref 6.5–8.1)

## 2021-01-17 LAB — MAGNESIUM: Magnesium: 2.1 mg/dL (ref 1.7–2.4)

## 2021-01-17 LAB — GLUCOSE, CAPILLARY: Glucose-Capillary: 220 mg/dL — ABNORMAL HIGH (ref 70–99)

## 2021-01-17 LAB — PHOSPHORUS: Phosphorus: 2.5 mg/dL (ref 2.5–4.6)

## 2021-01-17 MED ORDER — OSMOLITE 1.5 CAL PO LIQD: 237.0000 mL | ORAL | 0 refills | Status: AC

## 2021-01-17 MED ORDER — DEXAMETHASONE 4 MG PO TABS: 4.0000 mg | ORAL_TABLET | Freq: Four times a day (QID) | ORAL | 0 refills | Status: AC

## 2021-01-17 NOTE — Discharge Summary (Signed)
Physician Discharge Summary  Tyler Gallagher LFY:[id number] DOB: April 02, 1961 DOA: 01/11/2021  PCP: Juluis Pitch, MD  Admit date: 01/11/2021 Discharge date: 01/17/2021  Admitted From: Home Disposition:  Home  Recommendations for Outpatient Follow-up:  1. Follow up with PCP in 1-2 weeks 2. Follow up with Oncology as scheduled 3. Follow up with Radiation Oncology as scheduled  Discharge Condition:Stable CODE STATUS:Full Diet recommendation: Diabetic with tube feeding  Per dietitian: goal regimen of Osmolite 1.5 Cal 1 carton (237 mL) 8 times daily per tube. Provides 2840 kcal, 119 grams of protein, 1448 mL H2O daily  Brief/Interim Summary: 60 y.o.malewith medical history significant oflung cancer metastasized to the brain and liver, HTN, HLD, DM, sCFH with EF 30-35%, s/p of L AKA,who is presents with seizure.  Patient was recently hospitalized to [facility] from 01/7-1/9 due to seizurewhich is2/2 to metastatic lung adenocarcinomato brain.Pt was discharged on Keppra500 mg bidand decadron 4 mg tid. Pt states that he wasunable to fill his Keppra prescription after recent discharge.He states that he feelsjittery all oversince last night. States he had similar symptoms when he had a seizure and was admitted to [facility] 01/06/2021.  Patient does not have unilateral numbness or tinglinginhis extremities,no facial droop or slurred speech. Patient has mild dry cough, but denies chest pain, shortness of breath. No fever or chills. No nausea,vomiting, diarrhea, abdominal pain, symptoms of UTI.  He is followed by Dr.Raowith oncology. He had his second cycle of Keytruda on 01/05/2021. He has status post whole brain radiation. Last chemotherapy was in October 2021. Pt ischeduled for G-tube placement tomorrowdue topoor oral intake, weight loss and protein calorie malnutrition.  ED Course:pt was found to have WBC 15.6, negative urinalysis, negative COVID PCR,  potassium 2.7, renal function okay, temperature 97.4, blood pressure soft, heart rate 120, 100, 22, oxygen saturation 95% on room air. Chest x-ray is negative for new infiltration  Discharge Diagnoses:  Principal Problem:   Seizures ([place]) Active Problems:   Hyperlipidemia   Type 2 diabetes mellitus without complication, without long-term current use of insulin (HCC)   Brain metastases (HCC)   Malignant neoplasm of lower lobe of right lung (HCC)   Chronic systolic CHF (congestive heart failure) (HCC)   Hypokalemia   Leucocytosis   Protein-calorie malnutrition, moderate (HCC)   Seizure (HCC)   HTN (hypertension)   Protein-calorie malnutrition, severe  Recurrent Seizures ([place]): -Patient was recently hospitalized to [facility] due to seizurewhich is2/2 to metastatic lung adenocarcinomato brain. -Pt is supposed to takeKeppra 500 mg bid, but he did not start this med secondary to barriers obtaining meds, per pt -Presents with recurrent seizure -Now on scheduled keppra and decadron 4mg  q6hrs -Remains seizure free -MRI brain reviewed with Neurology over phone. Pt noted to have an increase in size and heterogeneity of parasagittal L parietal lesion with new extensive surrounding edema. Case was discussed with Neurology -Remains without seizures at this time   Protein-calorie malnutrition, moderate:  -Discussed with Surgery. Plan for G-tube placement 1/14 for supplemental nutrition -Discussed with Neurology who reviewed MRI with me over phone. Per Neurology, benefits to G tube placement may outweigh neurologic risk at this time -Continuing to tolerate PO, supplemental tube feeding per Nutrition -Tolerating tube feeding, now at goal  Hyperlipidemia -pt was takingZocor, but is not taking it currently  Type 2 diabetes mellitus without complication, without long-term current use of insulin ([place]): -Recent A1c 5.7, well controlled. Patient taking Actos -continued with  SSI coverage as needed while in hospital  Malignant neoplasm of lower lobe of right lung with bain metastases, also likely new liver and L subcostal soft tissue mets ([place]): - Continued with 4 mg decadron q6h -discussed with Surgery. Pt was found to have new large hepatic mets over L lobe of liver with metastatic disease involving L subcostal soft tissue -Pt made aware of findings -pt to f/u [name] of oncology -Pt seen by Rad Onc. Rec to f/u in 2 months or sooner as needed  Chronic systolic CHF (congestive heart failure)(HCC): -Patient does not have leg edema or JVD.  -CHF seems to be compensated. 2D echo obtained/27/21 showed a EF of 30-35%.  -Patient is not taking diuretics.  -currently appears euvolemic  Hypokalemia: K=2.7on admission. - Was replaced  Leucocytosis: -Presenting WBC 15.6. No source of infection identified, possibly due to Decadron use.UA negative. CXR negative for newinfiltration. Procalcitonin level<0.10. Patient received 1 dose of cefepime by ED.will not continue antibiotics -Staph epi noted in urine, however pt is asymptomatic. Therefore asymptomatic bacturia for which abx will cont to be held  HTN: -Cont with Metoprolol as pt tolerates  Discharge Instructions   Allergies as of 01/17/2021      Reactions   Penicillins Other (See Comments)   "everything felt distant"      Medication List    STOP taking these medications   potassium chloride SA 20 MEQ tablet Commonly known as: KLOR-CON   vancomycin 50 mg/mL  oral solution Commonly known as: VANCOCIN     TAKE these medications   dexamethasone 4 MG tablet Commonly known as: DECADRON Place 1 tablet (4 mg total) into feeding tube every 6 (six) hours. What changed:   how to take this  when to take this   dronabinol 5 MG capsule Commonly known as: MARINOL Take 1 capsule (5 mg total) by mouth 2 (two) times daily before a meal.   levETIRAcetam 500 MG tablet Commonly known as:  KEPPRA Take 1 tablet (500 mg total) by mouth 2 (two) times daily. What changed: Another medication with the same name was added. Make sure you understand how and when to take each.   levETIRAcetam 500 MG tablet Commonly known as: Keppra Take 1 tablet (500 mg total) by mouth 2 (two) times daily. What changed: You were already taking a medication with the same name, and this prescription was added. Make sure you understand how and when to take each.   metFORMIN 500 MG tablet Commonly known as: GLUCOPHAGE Take 1,000 mg by mouth 2 (two) times daily with a meal.   metoprolol succinate 25 MG 24 hr tablet Commonly known as: TOPROL-XL Take 0.5 tablets (12.5 mg total) by mouth daily.   vitamin B-12 1000 MCG tablet Commonly known as: CYANOCOBALAMIN Take 1,000 mcg by mouth daily.            Durable Medical Equipment  (From admission, onward)         Start     Ordered   01/16/21 1012  For home use only DME 3 n 1  Once        01/16/21 1011          Follow-up Information    Juluis Pitch, MD. Schedule an appointment as soon as possible for a visit in 2 week(s).   Specialty: Family Medicine Contact information: [place] [address] [phone]        Kate Sable, MD .   Specialties: Cardiology, Radiology Contact information: [address] [place] [address] [phone]  [name], MD Follow up.   Specialty: Oncology Why: as scheduled Contact information: [place] [address] [phone]        Noreene Filbert, MD Follow up in 2 month(s).   Specialty: Radiation Oncology Contact information: [place] [address] [phone]              Allergies  Allergen Reactions  . Penicillins Other (See Comments)    "everything felt distant"    Consultations:  Oncology  Neurology  General Surgery  Radiation Oncology  Procedures/Studies: DG  Chest 2 View  Result Date: 01/11/2021 CLINICAL DATA:  Tachycardia.  History of lung carcinoma EXAM: CHEST - 2 VIEW COMPARISON:  Chest radiograph August 18, 2020; chest CT November 08, 2020 FINDINGS: Port-A-Cath tip is in the superior vena cava. No pneumothorax. There is underlying emphysematous change with apparent bullous disease in the upper lobes. There is no edema or airspace opacity. The previously noted right lower lobe mass seen on most recent CT is not appreciable by radiography. Heart size is normal. Pulmonary vascularity reflects underlying emphysematous change. No adenopathy. No bone lesions. IMPRESSION: Port-A-Cath tip in superior vena cava. No pneumothorax. Underlying emphysematous change. No edema or airspace opacity. Right lower lobe mass noted on previous CT not appreciable by radiography. Heart size within normal limits. No adenopathy appreciable by radiography. Emphysema (ICD10-J43.9). Electronically Signed   By: Lowella Grip III M.D.   On: 01/11/2021 08:27   MR BRAIN W WO CONTRAST  Result Date: 01/06/2021 CLINICAL DATA:  Right-sided weakness, history of metastatic lung cancer with history of resection and whole brain radiation EXAM: MRI HEAD WITHOUT AND WITH CONTRAST TECHNIQUE: Multiplanar, multiecho pulse sequences of the brain and surrounding structures were obtained without and with intravenous contrast. CONTRAST:  6mL GADAVIST GADOBUTROL 1 MMOL/ML IV SOLN COMPARISON:  11/08/2020 FINDINGS: Brain: In the parasagittal left parietal lobe, there is a heterogeneously enhancing, partially necrotic lesion measuring 1.6 x 1.2 cm at the location of prior 0.6 cm lesion. There is extensive surrounding edema. Slightly increased enhancement in the parasagittal left frontal lobe at the inferior margin of the resection cavity. The punctate foci of enhancement in the left frontoparietal region on the prior study are not seen although there is greater motion artifact on this study. Encephalomalacia at  the site of prior right frontal infarct. There is no acute infarction. No hydrocephalus. No significant mass effect. Vascular: Major vessel flow voids at the skull base are preserved. Skull and upper cervical spine: Left vertex craniotomy. Normal marrow signal is preserved. Sinuses/Orbits: Paranasal sinuses are aerated. Orbits are unremarkable. Other: Sella is unremarkable.  Mastoid air cells are clear. IMPRESSION: Increase in size and heterogeneity of parasagittal left parietal lesion with new extensive surrounding edema. Likely reflects progression of metastatic disease. However, given history of radiation, disproportionate edema, and appearance of enhancement, treatment effect is a consideration. Punctate foci of enhancement in the left frontoparietal region are no longer seen. Electronically Signed   By: Macy Mis M.D.   On: 01/06/2021 17:32   CT HEAD CODE STROKE WO CONTRAST  Result Date: 01/06/2021 CLINICAL DATA:  Code stroke.  Neuro deficit, acute stroke suspected. EXAM: CT HEAD WITHOUT CONTRAST TECHNIQUE: Contiguous axial images were obtained from the base of the skull through the vertex without intravenous contrast. COMPARISON:  MRI November 08, 2020.  CT head June 21, 2020. FINDINGS: Brain: There is new exuberant vasogenic edema in the high left parietal  lobe with suspected mass in this region (see series 3, image 24). Local sulcal effacement without midline shift. Small area of encephalomalacia in the high right parafalcine region, compatible with sequela of prior metastatic lesion resection. Remote right frontal infarct with encephalomalacia. No evidence of acute large vascular territory infarct. No acute hemorrhage. No hydrocephalus. Additional patchy white matter hypoattenuation, most likely related to chronic microvascular ischemic disease Vascular: Calcific atherosclerosis. Skull: No acute fracture. Sinuses/Orbits: Mild paranasal sinus mucosal thickening with polyp versus retention cyst in the  right maxillary sinus. Other: Small left mastoid effusion. IMPRESSION: New vasogenic edema in the high left parietal lobe with suspected mass in this region, concerning for progression of the patient's known metastatic disease. Local sulcal effacement without midline shift. Recommend MRI with contrast to further evaluate. Findings discussed with Dr. Milas Gain via telephone at 2:46 p.m. Electronically Signed   By: Margaretha Sheffield MD   On: 01/06/2021 14:49     Subjective: Eager to go home  Discharge Exam: Vitals:   01/17/21 0049 01/17/21 0841  BP: 100/69 (!) 84/57  Pulse: 73 76  Resp: 16 16  Temp: 98.2 F (36.8 C) 97.7 F (36.5 C)  SpO2: 95% 94%   Vitals:   01/16/21 1556 01/17/21 0049 01/17/21 0500 01/17/21 0841  BP: 103/79 100/69  (!) 84/57  Pulse: 90 73  76  Resp: 16 16  16   Temp: (!) 97.4 F (36.3 C) 98.2 F (36.8 C)  97.7 F (36.5 C)  TempSrc: Oral     SpO2: 93% 95%  94%  Weight:   68.6 kg   Height:       General: Pt is alert, awake, not in acute distress Cardiovascular: RRR, S1/S2 +, no rubs, no gallops Respiratory: CTA bilaterally, no wheezing, no rhonchi Abdominal: Soft, NT, ND, bowel sounds + Extremities: no edema, no cyanosis  The results of significant diagnostics from this hospitalization (including imaging, microbiology, ancillary and laboratory) are listed below for reference.     Microbiology: Recent Results (from the past 240 hour(s))  Urine culture     Status: Abnormal   Collection Time: 01/11/21  7:32 AM   Specimen: In/Out Cath Urine  Result Value Ref Range Status   Specimen Description   Final    IN/OUT CATH URINE Performed at [facility], [address] [place], [place] [address]    Special Requests   Final    NONE Performed at [facility], [address] [place], [place] [address]    Culture >=100,000 COLONIES/mL STAPHYLOCOCCUS EPIDERMIDIS (A)  Final   Report Status 01/14/2021 FINAL  Final   Organism ID, Bacteria  STAPHYLOCOCCUS EPIDERMIDIS (A)  Final      Susceptibility   Staphylococcus epidermidis - MIC*    CIPROFLOXACIN >=8 RESISTANT Resistant     GENTAMICIN 8 INTERMEDIATE Intermediate     NITROFURANTOIN <=16 SENSITIVE Sensitive     OXACILLIN >=4 RESISTANT Resistant     TETRACYCLINE 2 SENSITIVE Sensitive     VANCOMYCIN 1 SENSITIVE Sensitive     TRIMETH/SULFA 80 RESISTANT Resistant     CLINDAMYCIN >=8 RESISTANT Resistant     RIFAMPIN <=0.5 SENSITIVE Sensitive     Inducible Clindamycin NEGATIVE Sensitive     * >=100,000 COLONIES/mL STAPHYLOCOCCUS EPIDERMIDIS  Resp Panel by RT-PCR (Flu A&B, Covid) Nasopharyngeal Swab     Status: None   Collection Time: 01/11/21  7:32 AM   Specimen: Nasopharyngeal Swab; Nasopharyngeal(NP) swabs in vial transport medium  Result Value Ref Range Status   SARS  Coronavirus 2 by RT PCR NEGATIVE NEGATIVE Final    Comment: (NOTE) SARS-CoV-2 target nucleic acids are NOT DETECTED.  The SARS-CoV-2 RNA is generally detectable in upper respiratory specimens during the acute phase of infection. The lowest concentration of SARS-CoV-2 viral copies this assay can detect is 138 copies/mL. A negative result does not preclude SARS-Cov-2 infection and should not be used as the sole basis for treatment or other patient management decisions. A negative result may occur with  improper specimen collection/handling, submission of specimen other than nasopharyngeal swab, presence of viral mutation(s) within the areas targeted by this assay, and inadequate number of viral copies(<138 copies/mL). A negative result must be combined with clinical observations, patient history, and epidemiological information. The expected result is Negative.  Fact Sheet for Patients:  EntrepreneurPulse.com.au  Fact Sheet for Healthcare Providers:  IncredibleEmployment.be  This test is no t yet approved or cleared by the [place] FDA and  has been authorized  for detection and/or diagnosis of SARS-CoV-2 by FDA under an Emergency Use Authorization (EUA). This EUA will remain  in effect (meaning this test can be used) for the duration of the COVID-19 declaration under Section 564(b)(1) of the Act, 21 U.S.C.section 360bbb-3(b)(1), unless the authorization is terminated  or revoked sooner.       Influenza A by PCR NEGATIVE NEGATIVE Final   Influenza B by PCR NEGATIVE NEGATIVE Final    Comment: (NOTE) The Xpert Xpress SARS-CoV-2/FLU/RSV plus assay is intended as an aid in the diagnosis of influenza from Nasopharyngeal swab specimens and should not be used as a sole basis for treatment. Nasal washings and aspirates are unacceptable for Xpert Xpress SARS-CoV-2/FLU/RSV testing.  Fact Sheet for Patients: EntrepreneurPulse.com.au  Fact Sheet for Healthcare Providers: IncredibleEmployment.be  This test is not yet approved or cleared by the [place] FDA and has been authorized for detection and/or diagnosis of SARS-CoV-2 by FDA under an Emergency Use Authorization (EUA). This EUA will remain in effect (meaning this test can be used) for the duration of the COVID-19 declaration under Section 564(b)(1) of the Act, 21 U.S.C. section 360bbb-3(b)(1), unless the authorization is terminated or revoked.  Performed at [facility], [address] [place], [place] [address]   Blood culture (routine x 2)     Status: None   Collection Time: 01/11/21  7:57 AM   Specimen: BLOOD  Result Value Ref Range Status   Specimen Description BLOOD BLOOD RIGHT ARM  Final   Special Requests   Final    BOTTLES DRAWN AEROBIC AND ANAEROBIC Blood Culture adequate volume   Culture   Final    NO GROWTH 5 DAYS Performed at [facility], [address], [place], [place] [address]    Report Status 01/16/2021 FINAL  Final  Blood culture (routine x 2)     Status: None   Collection Time: 01/11/21  7:58 AM    Specimen: BLOOD  Result Value Ref Range Status   Specimen Description BLOOD BLOOD LEFT ARM  Final   Special Requests   Final    BOTTLES DRAWN AEROBIC AND ANAEROBIC Blood Culture adequate volume   Culture   Final    NO GROWTH 5 DAYS Performed at [facility], [address], [place], [place] [address]    Report Status 01/16/2021 FINAL  Final     Labs: BNP (last 3 results) Recent Labs    11/08/20 0432 01/11/21 0659  BNP 42.3 21.3   Basic Metabolic Panel: Recent Labs  Lab 01/11/21  8675 01/11/21 1549 01/12/21 0145 01/14/21 0644 01/15/21 0649 01/16/21 0453 01/17/21 0450  NA 136   < > 135 141 140 139 138  K 2.7*   < > 3.9 3.4* 3.3* 3.9 4.2  CL 96*   < > 102 103 101 101 102  CO2 26   < > 25 28 31 30 27   GLUCOSE 117*   < > 120* 104* 95 113* 123*  BUN 8   < > 8 8 8 8 13   CREATININE 0.45*   < > 0.43* 0.38* 0.45* 0.41* <0.30*  CALCIUM 8.3*   < > 8.4* 8.2* 8.9 8.6* 8.7*  MG 1.9  --   --  2.0 2.0 1.9 2.1  PHOS  --   --   --   --   --  1.7* 2.5   < > = values in this interval not displayed.   Liver Function Tests: Recent Labs  Lab 01/11/21 0659 01/14/21 0644 01/15/21 0649 01/16/21 0453 01/17/21 0450  AST 23 21 21 21  33  ALT 23 29 31  35 44  ALKPHOS 147* 136* 154* 172* 218*  BILITOT 1.1 0.7 0.5 0.6 0.7  PROT 6.4* 5.7* 5.8* 6.1* 6.2*  ALBUMIN 2.6* 2.6* 2.6* 2.6* 2.7*   No results for input(s): LIPASE, AMYLASE in the last 168 hours. No results for input(s): AMMONIA in the last 168 hours. CBC: Recent Labs  Lab 01/11/21 0659 01/12/21 0145  WBC 15.6* 10.0  NEUTROABS 13.2*  --   HGB 12.2* 11.0*  HCT 37.2* 34.2*  MCV 90.7 91.4  PLT 285 260   Cardiac Enzymes: No results for input(s): CKTOTAL, CKMB, CKMBINDEX, TROPONINI in the last 168 hours. BNP: Invalid input(s): POCBNP CBG: Recent Labs  Lab 01/16/21 0832 01/16/21 1206 01/16/21 1700 01/16/21 2125 01/17/21 0841  GLUCAP 134* 116* 111* 159* 220*   D-Dimer No results for input(s): DDIMER in the  last 72 hours. Hgb A1c No results for input(s): HGBA1C in the last 72 hours. Lipid Profile No results for input(s): CHOL, HDL, LDLCALC, TRIG, CHOLHDL, LDLDIRECT in the last 72 hours. Thyroid function studies No results for input(s): TSH, T4TOTAL, T3FREE, THYROIDAB in the last 72 hours.  Invalid input(s): FREET3 Anemia work up No results for input(s): VITAMINB12, FOLATE, FERRITIN, TIBC, IRON, RETICCTPCT in the last 72 hours. Urinalysis    Component Value Date/Time   COLORURINE STRAW (A) 01/11/2021 0732   APPEARANCEUR CLEAR (A) 01/11/2021 0732   APPEARANCEUR Clear 07/13/2020 0846   LABSPEC 1.002 (L) 01/11/2021 0732   PHURINE 6.0 01/11/2021 0732   GLUCOSEU NEGATIVE 01/11/2021 0732   HGBUR NEGATIVE 01/11/2021 0732   BILIRUBINUR NEGATIVE 01/11/2021 0732   BILIRUBINUR Negative 07/13/2020 0846   KETONESUR 5 (A) 01/11/2021 0732   PROTEINUR NEGATIVE 01/11/2021 0732   NITRITE NEGATIVE 01/11/2021 0732   LEUKOCYTESUR NEGATIVE 01/11/2021 0732   Sepsis Labs Invalid input(s): PROCALCITONIN,  WBC,  LACTICIDVEN Microbiology Recent Results (from the past 240 hour(s))  Urine culture     Status: Abnormal   Collection Time: 01/11/21  7:32 AM   Specimen: In/Out Cath Urine  Result Value Ref Range Status   Specimen Description   Final    IN/OUT CATH URINE Performed at [facility], [address], [place], [place] [address]    Special Requests   Final    NONE Performed at [facility], [address] [place], [place] [address]    Culture >=100,000 COLONIES/mL STAPHYLOCOCCUS EPIDERMIDIS (A)  Final   Report Status 01/14/2021 FINAL  Final  Organism ID, Bacteria STAPHYLOCOCCUS EPIDERMIDIS (A)  Final      Susceptibility   Staphylococcus epidermidis - MIC*    CIPROFLOXACIN >=8 RESISTANT Resistant     GENTAMICIN 8 INTERMEDIATE Intermediate     NITROFURANTOIN <=16 SENSITIVE Sensitive     OXACILLIN >=4 RESISTANT Resistant     TETRACYCLINE 2 SENSITIVE Sensitive      VANCOMYCIN 1 SENSITIVE Sensitive     TRIMETH/SULFA 80 RESISTANT Resistant     CLINDAMYCIN >=8 RESISTANT Resistant     RIFAMPIN <=0.5 SENSITIVE Sensitive     Inducible Clindamycin NEGATIVE Sensitive     * >=100,000 COLONIES/mL STAPHYLOCOCCUS EPIDERMIDIS  Resp Panel by RT-PCR (Flu A&B, Covid) Nasopharyngeal Swab     Status: None   Collection Time: 01/11/21  7:32 AM   Specimen: Nasopharyngeal Swab; Nasopharyngeal(NP) swabs in vial transport medium  Result Value Ref Range Status   SARS Coronavirus 2 by RT PCR NEGATIVE NEGATIVE Final    Comment: (NOTE) SARS-CoV-2 target nucleic acids are NOT DETECTED.  The SARS-CoV-2 RNA is generally detectable in upper respiratory specimens during the acute phase of infection. The lowest concentration of SARS-CoV-2 viral copies this assay can detect is 138 copies/mL. A negative result does not preclude SARS-Cov-2 infection and should not be used as the sole basis for treatment or other patient management decisions. A negative result may occur with  improper specimen collection/handling, submission of specimen other than nasopharyngeal swab, presence of viral mutation(s) within the areas targeted by this assay, and inadequate number of viral copies(<138 copies/mL). A negative result must be combined with clinical observations, patient history, and epidemiological information. The expected result is Negative.  Fact Sheet for Patients:  EntrepreneurPulse.com.au  Fact Sheet for Healthcare Providers:  IncredibleEmployment.be  This test is no t yet approved or cleared by the [place] FDA and  has been authorized for detection and/or diagnosis of SARS-CoV-2 by FDA under an Emergency Use Authorization (EUA). This EUA will remain  in effect (meaning this test can be used) for the duration of the COVID-19 declaration under Section 564(b)(1) of the Act, 21 U.S.C.section 360bbb-3(b)(1), unless the authorization is  terminated  or revoked sooner.       Influenza A by PCR NEGATIVE NEGATIVE Final   Influenza B by PCR NEGATIVE NEGATIVE Final    Comment: (NOTE) The Xpert Xpress SARS-CoV-2/FLU/RSV plus assay is intended as an aid in the diagnosis of influenza from Nasopharyngeal swab specimens and should not be used as a sole basis for treatment. Nasal washings and aspirates are unacceptable for Xpert Xpress SARS-CoV-2/FLU/RSV testing.  Fact Sheet for Patients: EntrepreneurPulse.com.au  Fact Sheet for Healthcare Providers: IncredibleEmployment.be  This test is not yet approved or cleared by the [place] FDA and has been authorized for detection and/or diagnosis of SARS-CoV-2 by FDA under an Emergency Use Authorization (EUA). This EUA will remain in effect (meaning this test can be used) for the duration of the COVID-19 declaration under Section 564(b)(1) of the Act, 21 U.S.C. section 360bbb-3(b)(1), unless the authorization is terminated or revoked.  Performed at [facility], [address] [place], [place] [address]   Blood culture (routine x 2)     Status: None   Collection Time: 01/11/21  7:57 AM   Specimen: BLOOD  Result Value Ref Range Status   Specimen Description BLOOD BLOOD RIGHT ARM  Final   Special Requests   Final    BOTTLES DRAWN AEROBIC AND ANAEROBIC Blood Culture adequate volume   Culture   Final  NO GROWTH 5 DAYS Performed at [facility], [address] [place], [place] [address]    Report Status 01/16/2021 FINAL  Final  Blood culture (routine x 2)     Status: None   Collection Time: 01/11/21  7:58 AM   Specimen: BLOOD  Result Value Ref Range Status   Specimen Description BLOOD BLOOD LEFT ARM  Final   Special Requests   Final    BOTTLES DRAWN AEROBIC AND ANAEROBIC Blood Culture adequate volume   Culture   Final    NO GROWTH 5 DAYS Performed at [facility], [address] [place],  [place] [address]    Report Status 01/16/2021 FINAL  Final   Time spent: 18min  SIGNED:   Marylu Lund, MD  [facility] 01/17/2021, 10:39 AM  If 7PM-7AM, please contact night-coverage

## 2021-01-17 NOTE — Progress Notes (Signed)
Nutrition Brief Note  Plan is for patient to discharge today. Discussed with outpatient RD at cancer center and as patient had poor PO intake plan to complete medical necessity letter as patient does need >2500 kcal. Medical necessity letter was signed by MD. RD faxed letter and operative note from G-tube placement to [place] representative Dayton Scrape per her request. Ace Gins will be at patient's house tomorrow AM to bring supplies and complete training/education. Mendel Ryder requested RD send home TF formula for rest of today for patient. RD dropped these off in patient's room. Patient reports he is tolerating bolus TF regimen well.  Jacklynn Barnacle, MS, RD, LDN Pager number available on Amion

## 2021-01-17 NOTE — Care Management Important Message (Signed)
Important Message  Patient Details  Name: Tyler Gallagher MRN: [id number] Date of Birth: 25-Apr-1961   Medicare Important Message Given:  Yes     Juliann Pulse A Steffie Waggoner 01/17/2021, 11:50 AM

## 2021-01-17 NOTE — TOC Transition Note (Signed)
Transition of Care [facility]) - CM/SW Discharge Note   Patient Details  Name: Tyler Gallagher MRN: [id number] Date of Birth: Feb 25, 1961  [facility] CM/SW Contact:  Magnus Ivan, LCSW Phone Number: 01/17/2021, 9:16 AM   Clinical Narrative:   Patient to discharge home today. Tube feeds arranged by [place] and RD at hospital. 3 in 1 ordered yesterday. Home Health declined. Spoke to patient who denied additional TOC needs prior to DC and reported family will pick him up at time of DC. Left VM for significant other Rosalyn regarding plan for DC today. TOC signing off.    Final next level of care: Home/Self Care Barriers to Discharge: Barriers Resolved   Patient Goals and CMS Choice Patient states their goals for this hospitalization and ongoing recovery are:: to return home CMS Medicare.gov Compare Post Acute Care list provided to:: Patient Choice offered to / list presented to : [facility]  Discharge Placement                Patient to be transferred to facility by: family Name of family member notified: Karie Kirks - significant other Patient and family notified of of transfer: 01/17/21  Discharge Plan and Services                DME Arranged: 3-N-1 DME Agency: [employer] Date DME Agency Contacted: 01/16/21                Social Determinants of Health (SDOH) Interventions     Readmission Risk Interventions Readmission Risk Prevention Plan 01/16/2021  Transportation Screening Complete  PCP or Specialist Appt within 3-5 Days Complete  HRI or [place] Complete  Social Work Consult for [place] Planning/Counseling Complete  Palliative Care Screening Not Applicable  Medication Review [employer]) Complete  Some recent data might be hidden

## 2021-01-18 DIAGNOSIS — C3431 Malignant neoplasm of lower lobe, right bronchus or lung: Secondary | ICD-10-CM | POA: Diagnosis not present

## 2021-01-18 DIAGNOSIS — R634 Abnormal weight loss: Secondary | ICD-10-CM | POA: Diagnosis not present

## 2021-01-19 ENCOUNTER — Other Ambulatory Visit: Payer: Self-pay | Admitting: *Deleted

## 2021-01-19 ENCOUNTER — Telehealth: Payer: Self-pay | Admitting: *Deleted

## 2021-01-19 DIAGNOSIS — C7931 Secondary malignant neoplasm of brain: Secondary | ICD-10-CM

## 2021-01-19 DIAGNOSIS — C3490 Malignant neoplasm of unspecified part of unspecified bronchus or lung: Secondary | ICD-10-CM

## 2021-01-19 NOTE — Telephone Encounter (Signed)
Can you f/u tomorrow and if no BM can add lactulose through peg

## 2021-01-19 NOTE — Telephone Encounter (Signed)
Received call from pt that he is having trouble moving his bowels. Pt states that he has not had a bowel movement since 1/14 and has not tried any OTC meds at this time. Pt states has been doing tube feedings 5-6 times daily with water intake. He denies any abdominal pain at this time.   Pt instructed to start senokot twice daily with miralax daily. Instructed for pt to call back tomorrow if has not produced a bowel movement at that time.

## 2021-01-20 ENCOUNTER — Emergency Department: Payer: Medicare HMO

## 2021-01-20 ENCOUNTER — Inpatient Hospital Stay
Admission: EM | Admit: 2021-01-20 | Discharge: 2021-01-24 | DRG: 393 | Disposition: A | Discharge status: Hospice | Payer: Medicare HMO | Attending: Family Medicine | Admitting: Family Medicine

## 2021-01-20 ENCOUNTER — Encounter: Payer: Self-pay | Admitting: Emergency Medicine

## 2021-01-20 ENCOUNTER — Other Ambulatory Visit: Payer: Self-pay

## 2021-01-20 DIAGNOSIS — G40909 Epilepsy, unspecified, not intractable, without status epilepticus: Secondary | ICD-10-CM | POA: Diagnosis present

## 2021-01-20 DIAGNOSIS — E785 Hyperlipidemia, unspecified: Secondary | ICD-10-CM | POA: Diagnosis present

## 2021-01-20 DIAGNOSIS — Z9221 Personal history of antineoplastic chemotherapy: Secondary | ICD-10-CM | POA: Diagnosis not present

## 2021-01-20 DIAGNOSIS — Z7401 Bed confinement status: Secondary | ICD-10-CM | POA: Diagnosis not present

## 2021-01-20 DIAGNOSIS — E43 Unspecified severe protein-calorie malnutrition: Secondary | ICD-10-CM | POA: Diagnosis present

## 2021-01-20 DIAGNOSIS — Z515 Encounter for palliative care: Secondary | ICD-10-CM

## 2021-01-20 DIAGNOSIS — Z923 Personal history of irradiation: Secondary | ICD-10-CM | POA: Diagnosis not present

## 2021-01-20 DIAGNOSIS — K5900 Constipation, unspecified: Secondary | ICD-10-CM | POA: Diagnosis present

## 2021-01-20 DIAGNOSIS — J449 Chronic obstructive pulmonary disease, unspecified: Secondary | ICD-10-CM | POA: Diagnosis not present

## 2021-01-20 DIAGNOSIS — A419 Sepsis, unspecified organism: Secondary | ICD-10-CM | POA: Diagnosis not present

## 2021-01-20 DIAGNOSIS — Z20822 Contact with and (suspected) exposure to covid-19: Secondary | ICD-10-CM | POA: Diagnosis present

## 2021-01-20 DIAGNOSIS — C7931 Secondary malignant neoplasm of brain: Secondary | ICD-10-CM | POA: Diagnosis present

## 2021-01-20 DIAGNOSIS — C7951 Secondary malignant neoplasm of bone: Secondary | ICD-10-CM | POA: Diagnosis present

## 2021-01-20 DIAGNOSIS — E871 Hypo-osmolality and hyponatremia: Secondary | ICD-10-CM | POA: Diagnosis present

## 2021-01-20 DIAGNOSIS — E119 Type 2 diabetes mellitus without complications: Secondary | ICD-10-CM | POA: Diagnosis present

## 2021-01-20 DIAGNOSIS — Z66 Do not resuscitate: Secondary | ICD-10-CM | POA: Diagnosis present

## 2021-01-20 DIAGNOSIS — C779 Secondary and unspecified malignant neoplasm of lymph node, unspecified: Secondary | ICD-10-CM | POA: Diagnosis present

## 2021-01-20 DIAGNOSIS — I5022 Chronic systolic (congestive) heart failure: Secondary | ICD-10-CM | POA: Diagnosis present

## 2021-01-20 DIAGNOSIS — C787 Secondary malignant neoplasm of liver and intrahepatic bile duct: Secondary | ICD-10-CM | POA: Diagnosis present

## 2021-01-20 DIAGNOSIS — C784 Secondary malignant neoplasm of small intestine: Secondary | ICD-10-CM | POA: Diagnosis present

## 2021-01-20 DIAGNOSIS — Z833 Family history of diabetes mellitus: Secondary | ICD-10-CM

## 2021-01-20 DIAGNOSIS — K9420 Gastrostomy complication, unspecified: Secondary | ICD-10-CM

## 2021-01-20 DIAGNOSIS — Z89612 Acquired absence of left leg above knee: Secondary | ICD-10-CM

## 2021-01-20 DIAGNOSIS — I110 Hypertensive heart disease with heart failure: Secondary | ICD-10-CM | POA: Diagnosis present

## 2021-01-20 DIAGNOSIS — Z7984 Long term (current) use of oral hypoglycemic drugs: Secondary | ICD-10-CM

## 2021-01-20 DIAGNOSIS — K6389 Other specified diseases of intestine: Secondary | ICD-10-CM | POA: Diagnosis not present

## 2021-01-20 DIAGNOSIS — R531 Weakness: Secondary | ICD-10-CM | POA: Diagnosis not present

## 2021-01-20 DIAGNOSIS — K9423 Gastrostomy malfunction: Secondary | ICD-10-CM | POA: Diagnosis present

## 2021-01-20 DIAGNOSIS — R000 Tachycardia, unspecified: Secondary | ICD-10-CM | POA: Diagnosis not present

## 2021-01-20 DIAGNOSIS — Z79899 Other long term (current) drug therapy: Secondary | ICD-10-CM

## 2021-01-20 DIAGNOSIS — Z4682 Encounter for fitting and adjustment of non-vascular catheter: Secondary | ICD-10-CM | POA: Diagnosis not present

## 2021-01-20 DIAGNOSIS — C799 Secondary malignant neoplasm of unspecified site: Secondary | ICD-10-CM | POA: Diagnosis present

## 2021-01-20 DIAGNOSIS — Z880 Allergy status to penicillin: Secondary | ICD-10-CM | POA: Diagnosis not present

## 2021-01-20 DIAGNOSIS — E872 Acidosis: Secondary | ICD-10-CM | POA: Diagnosis present

## 2021-01-20 DIAGNOSIS — Z6820 Body mass index (BMI) 20.0-20.9, adult: Secondary | ICD-10-CM

## 2021-01-20 DIAGNOSIS — N281 Cyst of kidney, acquired: Secondary | ICD-10-CM | POA: Diagnosis not present

## 2021-01-20 DIAGNOSIS — K7689 Other specified diseases of liver: Secondary | ICD-10-CM | POA: Diagnosis not present

## 2021-01-20 DIAGNOSIS — C3431 Malignant neoplasm of lower lobe, right bronchus or lung: Secondary | ICD-10-CM | POA: Diagnosis present

## 2021-01-20 DIAGNOSIS — F32A Depression, unspecified: Secondary | ICD-10-CM | POA: Diagnosis present

## 2021-01-20 DIAGNOSIS — R627 Adult failure to thrive: Secondary | ICD-10-CM | POA: Diagnosis present

## 2021-01-20 DIAGNOSIS — R5381 Other malaise: Secondary | ICD-10-CM | POA: Diagnosis not present

## 2021-01-20 DIAGNOSIS — Z87891 Personal history of nicotine dependence: Secondary | ICD-10-CM

## 2021-01-20 DIAGNOSIS — M2550 Pain in unspecified joint: Secondary | ICD-10-CM | POA: Diagnosis not present

## 2021-01-20 LAB — CBC
HCT: 39.5 % (ref 39.0–52.0)
Hemoglobin: 12.9 g/dL — ABNORMAL LOW (ref 13.0–17.0)
MCH: 30 pg (ref 26.0–34.0)
MCHC: 32.7 g/dL (ref 30.0–36.0)
MCV: 91.9 fL (ref 80.0–100.0)
Platelets: 253 10*3/uL (ref 150–400)
RBC: 4.3 MIL/uL (ref 4.22–5.81)
RDW: 16.3 % — ABNORMAL HIGH (ref 11.5–15.5)
WBC: 24.3 10*3/uL — ABNORMAL HIGH (ref 4.0–10.5)
nRBC: 0 % (ref 0.0–0.2)

## 2021-01-20 LAB — URINALYSIS, COMPLETE (UACMP) WITH MICROSCOPIC
Bacteria, UA: NONE SEEN
Bilirubin Urine: NEGATIVE
Glucose, UA: NEGATIVE mg/dL
Hgb urine dipstick: NEGATIVE
Ketones, ur: NEGATIVE mg/dL
Leukocytes,Ua: NEGATIVE
Nitrite: NEGATIVE
Protein, ur: NEGATIVE mg/dL
Specific Gravity, Urine: >1.046 — ABNORMAL HIGH (ref 1.005–1.030)
Squamous Epithelial / HPF: NONE SEEN (ref 0–5)
pH: 7 (ref 5.0–8.0)

## 2021-01-20 LAB — BASIC METABOLIC PANEL
Anion gap: 14 (ref 5–15)
BUN: 15 mg/dL (ref 6–20)
CO2: 25 mmol/L (ref 22–32)
Calcium: 9.2 mg/dL (ref 8.9–10.3)
Chloride: 94 mmol/L — ABNORMAL LOW (ref 98–111)
Creatinine, Ser: 0.42 mg/dL — ABNORMAL LOW (ref 0.61–1.24)
GFR, Estimated: >60 mL/min (ref >60)
Glucose, Bld: 169 mg/dL — ABNORMAL HIGH (ref 70–99)
Potassium: 4.1 mmol/L (ref 3.5–5.1)
Sodium: 133 mmol/L — ABNORMAL LOW (ref 135–145)

## 2021-01-20 LAB — LACTIC ACID, PLASMA
Lactic Acid, Venous: 3.2 mmol/L (ref 0.5–1.9)
Lactic Acid, Venous: 3.7 mmol/L (ref 0.5–1.9)

## 2021-01-20 MED ORDER — METRONIDAZOLE IN NACL 5-0.79 MG/ML-% IV SOLN
500.0000 mg | Freq: Once | INTRAVENOUS | Status: AC
  Administered 2021-01-20: 500 mg via INTRAVENOUS
  Filled 2021-01-20: qty 100

## 2021-01-20 MED ORDER — SODIUM CHLORIDE 0.9 % IV SOLN
2.0000 g | Freq: Once | INTRAVENOUS | Status: AC
  Administered 2021-01-20: 2 g via INTRAVENOUS
  Filled 2021-01-20: qty 2

## 2021-01-20 MED ORDER — IOHEXOL 9 MG/ML PO SOLN: 1000.0000 mL | Freq: Once | ORAL | Status: DC | PRN

## 2021-01-20 MED ORDER — LACTATED RINGERS IV BOLUS (SEPSIS)
2000.0000 mL | Freq: Once | INTRAVENOUS | Status: AC
  Administered 2021-01-20: 2000 mL via INTRAVENOUS

## 2021-01-20 MED ORDER — VANCOMYCIN HCL IN DEXTROSE 1-5 GM/200ML-% IV SOLN: 1000.0000 mg | Freq: Once | INTRAVENOUS | Status: DC

## 2021-01-20 MED ORDER — LACTATED RINGERS IV SOLN: INTRAVENOUS | Status: AC

## 2021-01-20 MED ORDER — IOHEXOL 300 MG/ML  SOLN
100.0000 mL | Freq: Once | INTRAMUSCULAR | Status: AC | PRN
  Administered 2021-01-20: 100 mL via INTRAVENOUS

## 2021-01-20 MED ORDER — LACTATED RINGERS IV BOLUS
1000.0000 mL | Freq: Once | INTRAVENOUS | Status: AC
  Administered 2021-01-20: 1000 mL via INTRAVENOUS

## 2021-01-20 MED ORDER — VANCOMYCIN HCL 1750 MG/350ML IV SOLN
1750.0000 mg | Freq: Once | INTRAVENOUS | Status: AC
  Administered 2021-01-20: 1750 mg via INTRAVENOUS
  Filled 2021-01-20: qty 350

## 2021-01-20 MED ORDER — MORPHINE SULFATE (PF) 4 MG/ML IV SOLN
4.0000 mg | Freq: Once | INTRAVENOUS | Status: AC
  Administered 2021-01-20: 4 mg via INTRAVENOUS
  Filled 2021-01-20: qty 1

## 2021-01-20 NOTE — Progress Notes (Signed)
CODE SEPSIS - PHARMACY COMMUNICATION  **Broad Spectrum Antibiotics should be administered within 1 hour of Sepsis diagnosis**  Time Code Sepsis Called/Page Received: 2035  Antibiotics Ordered: Cefepime, Flagyl and Vancomycin  Time of 1st antibiotic administration: 2111  Additional action taken by pharmacy:   If necessary, Name of Provider/Nurse Contacted:     Ena Dawley ,PharmD Clinical Pharmacist  01/20/2021  10:29 PM

## 2021-01-20 NOTE — Progress Notes (Signed)
PHARMACY -  BRIEF ANTIBIOTIC NOTE   Pharmacy has received consult(s) for Vanc, Cefepime from an ED provider.  The patient's profile has been reviewed for ht/wt/allergies/indication/available labs.    One time order(s) placed for Vancomycin 1750 mg IV X 1 , Cefepime 2 gm IV X 1.   Further antibiotics/pharmacy consults should be ordered by admitting physician if indicated.                       Thank you, Kyndel Egger D 01/20/2021  8:52 PM

## 2021-01-20 NOTE — ED Notes (Signed)
Pts dressing changed twice per pt request. Small amount of bleeding noted. Brown/black discharge noted from around PEG tube. Foul odor. Pt noted to be continuing to mess with dressing. Pt educated on leaving dressing in place.

## 2021-01-20 NOTE — Telephone Encounter (Signed)
Received call from pt's son that pt is currently at the hospital for weakness and feeling lightheaded.

## 2021-01-20 NOTE — H&P (Addendum)
History and Physical    Tyler Gallagher OEV:[id number] DOB: [birth year] DOA: 01/20/2021  PCP: Tyler Pitch, MD  Patient coming from:  home  I have personally briefly reviewed patient's old medical records in [place]  Chief Complaint:  Generalized weakness, as well as feeding leaking and constipation  HPI: Tyler Gallagher is a 60 y.o. 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 Recurrent sz due to lack of medication, failure to thrive at home with protein calorie malnutrition for which he has peg tube placed during that admission.  Patient was discharged in stable condition and returns to ed with complain of progressive generalized weakness , constipation as well as ? Of feeding tube dysfunction. Patient denies n/v/d/abdominal pain/ cough / chest pain or shortness of breath with his symptoms, and has had no recurrent seizures.  ED Course:  Afeb, bp 122/89, hr 109 sat 9% on ra  Wbc: 24, hgb 12.9' Na 133, K 4.1, cr0.42 WEX:HBZJI tachycardia , no acute st -twave changes Cxr: nad UA:neg Lactic 3.2  Ct abd: 1. Marked progression of metastatic disease. 2. Enlarging masses throughout the liver and spleen. 3. Metastatic adenopathy within the central mesentery and retroperitoneum. 4. Small bowel metastasis involving the jejunum within the left upper quadrant, without high-grade obstruction. 5. Bony metastatic disease as above, with large lytic lesion involving the proximal left femoral diaphysis only partially imaged on this exam. Given the degree of lucency and location, this is at risk for fracture. 6. Partially visualized right lower lobe mass consistent with primary malignancy. 7. Indwelling PEG tube. There is gas along the catheter tubing, without evidence of fluid  collection or abscess. Tx: cefepime, vanc,lactic ringers Review of Systems: As per HPI otherwise 10 point review of systems negative.   Past Medical History:  Diagnosis Date  . Depression   . Diabetes mellitus without complication ([place])   . Hyperlipidemia   . Hypertension   . Lesion of brain   . Lung cancer ([place])   . Mass of lung   . Seizures ([place])     Past Surgical History:  Procedure Laterality Date  . 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 COLONOSCOPY    . CYSTOSCOPY W/ RETROGRADES Bilateral 07/14/2020   Procedure: CYSTOSCOPY WITH RETROGRADE PYELOGRAM;  Surgeon: Hollice Espy, MD;  Location: [facility] ORS;  Service: Urology;  Laterality: Bilateral;  . PORTA CATH INSERTION N/A 08/03/2020   Procedure: PORTA CATH INSERTION;  Surgeon: Katha Cabal, MD;  Location: [place] [facility];  Service: Cardiovascular;  Laterality: N/A;  . PORTA CATH INSERTION N/A 10/03/2020   Procedure: PORTA CATH INSERTION;  Surgeon: Algernon Huxley, MD;  Location: [place];  Service: Cardiovascular;  Laterality: N/A;  . VIDEO BRONCHOSCOPY WITH ENDOBRONCHIAL NAVIGATION N/A 07/01/2020   Procedure: VIDEO BRONCHOSCOPY WITH ENDOBRONCHIAL NAVIGATION;  Surgeon: [name], MD;  Location: [facility] ORS;  Service: Thoracic;  Laterality: N/A;  . VIDEO BRONCHOSCOPY WITH ENDOBRONCHIAL ULTRASOUND N/A 07/01/2020   Procedure: VIDEO BRONCHOSCOPY WITH ENDOBRONCHIAL ULTRASOUND;  Surgeon: [name], MD;  Location: [facility] ORS;  Service: Thoracic;  Laterality: N/A;     reports that he quit smoking about 7 months ago. His smoking use included cigarettes. He has a 64.00 pack-year smoking history. He has never used smokeless tobacco. He reports previous drug use. Drug: Marijuana.  He reports that he does not drink alcohol.  Allergies  Allergen Reactions  . Penicillins Other (See Comments)    "everything felt distant"    Family History  Problem Relation Age of Onset  . Diabetes Sister   . Diabetes Brother     Prior to  Admission medications   Medication Sig Start Date End Date Taking? Authorizing Provider  dexamethasone (DECADRON) 4 MG tablet Place 1 tablet (4 mg total) into feeding tube every 6 (six) hours. 01/17/21 02/16/21  Tyler Hazel, MD  dronabinol (MARINOL) 5 MG capsule Take 1 capsule (5 mg total) by mouth 2 (two) times daily before a meal. 12/05/20   Tyler Guadeloupe, MD  levETIRAcetam (KEPPRA) 500 MG tablet Take 1 tablet (500 mg total) by mouth 2 (two) times daily. 01/08/21 February 26, 2021  Rehman, Areeg N, DO  levETIRAcetam (KEPPRA) 500 MG tablet Take 1 tablet (500 mg total) by mouth 2 (two) times daily. 01/11/21   Ward, Delice Bison, DO  metFORMIN (GLUCOPHAGE) 500 MG tablet Take 1,000 mg by mouth 2 (two) times daily with a meal. Patient not taking: No sig reported    [provider]  metoprolol succinate (TOPROL-XL) 25 MG 24 hr tablet Take 0.5 tablets (12.5 mg total) by mouth daily. 11/15/20   Gallagher Sable, MD  Nutritional Supplements (FEEDING SUPPLEMENT, OSMOLITE 1.5 CAL,) LIQD Place 237 mLs into feeding tube every 3 (three) hours. 01/17/21 02/16/21  Tyler Hazel, MD  vitamin B-12 (CYANOCOBALAMIN) 1000 MCG tablet Take 1,000 mcg by mouth daily.    [provider]    Physical Exam: Vitals:   01/20/21 2200 01/20/21 2215 01/20/21 2230 01/20/21 2245  BP: (!) 141/99 127/78 116/73 124/71  Pulse: 85 69 68 70  Resp:  18 14 15   Temp:      TempSrc:      SpO2: 97% 94% 94% 95%  Weight:      Height:        Constitutional:  Ill appearing NAD, calm, comfortable Vitals:   01/20/21 2200 01/20/21 2215 01/20/21 2230 01/20/21 2245  BP: (!) 141/99 127/78 116/73 124/71  Pulse: 85 69 68 70  Resp:  18 14 15   Temp:      TempSrc:      SpO2: 97% 94% 94% 95%  Weight:      Height:       Eyes: PERRL, lids and conjunctivae normal ENMT: Mucous membranes are moist. Posterior pharynx clear of any exudate or lesions.Normal dentition.  Neck: normal, supple, no masses, no thyromegaly Respiratory: clear to  auscultation bilaterally, no wheezing, no crackles. Normal respiratory effort. No accessory muscle use.  Cardiovascular: Regular rate and rhythm, no murmurs / rubs / gallops. No extremity edema. 2+ pedal pulses. No carotid bruits.  Abdomen: no tenderness, no masses palpated. Feeding tube intact, with noted dark drainage,  Bowel sounds positive.  Musculoskeletal: no clubbing / cyanosis.left aka extremities. Good ROM, no contractures. decrease muscle tone.  Skin: no rashes, lesions, ulcers. No induration Neurologic: CN 2-12 grossly intact. Sensation intact,. Strength 5/5 in all extremities.  Psychiatric: Normal judgment and insight. Alert and oriented x 3. Normal mood.    Labs on Admission: I have personally reviewed following labs and imaging studies  CBC: Recent Labs  Lab 01/20/21 1320  WBC 24.3*  HGB 12.9*  HCT 39.5  MCV 91.9  PLT 751   Basic Metabolic Panel: Recent Labs  Lab 01/14/21 0644 01/15/21 0649 01/16/21 0453 01/17/21 0450 01/20/21 1320  NA 141 140 139 138  133*  K 3.4* 3.3* 3.9 4.2 4.1  CL 103 101 101 102 94*  CO2 28 31 30 27 25   GLUCOSE 104* 95 113* 123* 169*  BUN 8 8 8 13 15   CREATININE 0.38* 0.45* 0.41* <0.30* 0.42*  CALCIUM 8.2* 8.9 8.6* 8.7* 9.2  MG 2.0 2.0 1.9 2.1  --   PHOS  --   --  1.7* 2.5  --    GFR: Estimated Creatinine Clearance: 103.4 mL/min (A) (by C-G formula based on SCr of 0.42 mg/dL (L)). Liver Function Tests: Recent Labs  Lab 01/14/21 0644 01/15/21 0649 01/16/21 0453 01/17/21 0450  AST 21 21 21  33  ALT 29 31 35 44  ALKPHOS 136* 154* 172* 218*  BILITOT 0.7 0.5 0.6 0.7  PROT 5.7* 5.8* 6.1* 6.2*  ALBUMIN 2.6* 2.6* 2.6* 2.7*   No results for input(s): LIPASE, AMYLASE in the last 168 hours. No results for input(s): AMMONIA in the last 168 hours. Coagulation Profile: No results for input(s): INR, PROTIME in the last 168 hours. Cardiac Enzymes: No results for input(s): CKTOTAL, CKMB, CKMBINDEX, TROPONINI in the last 168 hours. BNP  (last 3 results) No results for input(s): PROBNP in the last 8760 hours. HbA1C: No results for input(s): HGBA1C in the last 72 hours. CBG: Recent Labs  Lab 01/16/21 0832 01/16/21 1206 01/16/21 1700 01/16/21 2125 01/17/21 0841  GLUCAP 134* 116* 111* 159* 220*   Lipid Profile: No results for input(s): CHOL, HDL, LDLCALC, TRIG, CHOLHDL, LDLDIRECT in the last 72 hours. Thyroid Function Tests: No results for input(s): TSH, T4TOTAL, FREET4, T3FREE, THYROIDAB in the last 72 hours. Anemia Panel: No results for input(s): VITAMINB12, FOLATE, FERRITIN, TIBC, IRON, RETICCTPCT in the last 72 hours. Urine analysis:    Component Value Date/Time   COLORURINE YELLOW (A) 01/20/2021 1737   APPEARANCEUR CLEAR (A) 01/20/2021 1737   APPEARANCEUR Clear 07/13/2020 0846   LABSPEC >1.046 (H) 01/20/2021 1737   PHURINE 7.0 01/20/2021 1737   GLUCOSEU NEGATIVE 01/20/2021 1737   HGBUR NEGATIVE 01/20/2021 1737   BILIRUBINUR NEGATIVE 01/20/2021 1737   BILIRUBINUR Negative 07/13/2020 0846   KETONESUR NEGATIVE 01/20/2021 1737   PROTEINUR NEGATIVE 01/20/2021 1737   NITRITE NEGATIVE 01/20/2021 1737   LEUKOCYTESUR NEGATIVE 01/20/2021 1737    Radiological Exams on Admission: DG Chest 2 View  Result Date: 01/20/2021 CLINICAL DATA:  Weakness with known history of lung carcinoma EXAM: CHEST - 2 VIEW COMPARISON:  01/11/2021 FINDINGS: Cardiac shadow is within normal limits. Right-sided chest wall port is seen. Lungs are hyperinflated consistent with COPD. Previously seen right lower lobe lung mass is not well appreciated on today's exam due to its proximity to the cardiac border medially. IMPRESSION: COPD without acute abnormality. Electronically Signed   By: Inez Catalina M.D.   On: 01/20/2021 16:35   CT ABDOMEN PELVIS W CONTRAST  Result Date: 01/20/2021 CLINICAL DATA:  Generalized weakness, decreased bowel movements, leaking around feeding tube, history of metastatic small cell lung cancer EXAM: CT ABDOMEN AND  PELVIS WITH CONTRAST TECHNIQUE: Multidetector CT imaging of the abdomen and pelvis was performed using the standard protocol following bolus administration of intravenous contrast. CONTRAST:  150mL OMNIPAQUE IOHEXOL 300 MG/ML  SOLN COMPARISON:  11/08/2020 FINDINGS: Lower chest: Partial visualization of known right lower lobe malignancy, measuring approximately 1.6 x 1.4 cm image 1 of series 2. No airspace disease, effusion, or pneumothorax. Hepatobiliary: Since the previous exam, marked progression of known intrahepatic metastases. Multiple large confluent lesions are seen throughout the liver, largest in the left  lobe measuring 7.8 x 5.7 cm reference image 8 of series 2. This measures approximately 1.3 cm on previous exam. Multiple gallstones are identified without cholecystitis. No biliary dilation. Pancreas: Unremarkable. No pancreatic ductal dilatation or surrounding inflammatory changes. Spleen: Enlarging hypodensity ventral aspect of the spleen measuring 2.7 x 2.2 cm image 23 of series 2 consistent with progressive metastatic disease. Adrenals/Urinary Tract: Stable left renal cyst. The kidneys enhance normally and symmetrically. No urinary tract calculi or obstruction. Bladder and adrenals are unremarkable. Stomach/Bowel: No evidence of high-grade bowel obstruction or ileus. Percutaneous gastrostomy tube is seen within the gastric lumen. There is gas dissecting along the catheter tubing within the abdominal wall. No fluid collection. There is a segment of jejunum with significant wall thickening measuring up to 1.9 cm, image 42 series 2 within the left upper quadrant, suspicious for metastatic disease. Vascular/Lymphatic: Multiple enlarged lymph nodes are seen within the central mesentery, largest measuring 2.7 by 3.5 cm image 41 of series 2, consistent with metastatic disease. Stable atherosclerosis of the abdominal aorta without aneurysm or dissection. Reproductive: Prostate is unremarkable. Other: No free  fluid or free intraperitoneal gas. No abdominal wall hernia. Musculoskeletal: Metastatic lesion within the abdominal wall musculature of the right flank measuring 2.4 cm on image 38 of series 2. There is a lytic lesion within the left iliac bone abutting the anterior margin of the sacroiliac joint. Large lytic lesion partially visualized within the proximal left femoral diaphysis. These are consistent with metastatic disease. The left femoral lesion is at risk for fracture. IMPRESSION: 1. Marked progression of metastatic disease. 2. Enlarging masses throughout the liver and spleen. 3. Metastatic adenopathy within the central mesentery and retroperitoneum. 4. Small bowel metastasis involving the jejunum within the left upper quadrant, without high-grade obstruction. 5. Bony metastatic disease as above, with large lytic lesion involving the proximal left femoral diaphysis only partially imaged on this exam. Given the degree of lucency and location, this is at risk for fracture. 6. Partially visualized right lower lobe mass consistent with primary malignancy. 7. Indwelling PEG tube. There is gas along the catheter tubing, without evidence of fluid collection or abscess. Electronically Signed   By: Randa Ngo M.D.   On: 01/20/2021 20:29    EKG: Independently reviewed. See above   Assessment/Plan  Possible Occult infection vs steroid induce leukocytosis  -of note no fever, no focus of infection found  -will check procalcitonin , follow up on blood cultures -continue broad spectrum antibiotics and de-escalate base on results above   Lactic acidosis  -possibly related to occult infection but most likely due to malignancy /esp in setting of liver mets  -ivfs overnight and continue to trend    Lung cancer metastasized to the brain and liver -followed by oncology  -on Keytruda -status post whole brain radiation -Last chemotherapy was in October 2021  Failure to thrive -in setting of progressive  metastatic disease  -continue with tube feeds   ?Peg tube dysfunction  -of note peg tube in place ,leaking dark material, ct scan no obvious dysplacement  - tube study by ir in am   Constipation  -place on bowel regimen   Sz d/o -continue keppra, decadron  DmII -diet controlled -is/ fs   HTN -continue metoprolol   systolic heart failure  -EF 30-35% -no acute exacerbation    Mild hyponatremia -monitor labs    Social  -patient would like palliative care and hospice consult   DVT prophylaxis:  scd Code Status: DNR/DNI Family Communication: no  family by bedside Disposition Plan:  patient  expected to be admitted greater than 2 midnights Consults called: n/a Admission status:inpatient med tele   Clance Boll MD [facility]  If 7PM-7AM, please contact night-coverage [email or web address] Password [facility]  01/20/2021, 11:39 PM

## 2021-01-20 NOTE — ED Triage Notes (Signed)
Pt to ED via POV stating that he is here for generalized weakness. Pt seen and admitted recently for same. Pt states that he was discharged on 1/18. Pt states that he is not any better. Pt is still having a lot of weakness and feels like he is going to fall out. Pt states that he is also having issues with his feeding tube leaking. Pt is in NAD.

## 2021-01-20 NOTE — ED Provider Notes (Signed)
[facility] Emergency Department Provider Note ____________________________________________   Event Date/Time   First MD Initiated Contact with Patient 01/20/21 1609     (approximate)  I have reviewed the triage vital signs and the nursing notes.  HISTORY  Chief Complaint Weakness   HPI WILLE AUBUCHON is a 60 y.o. malewho presents to the ED for evaluation of weakness.   Chart review indicates patient just admitted here 1/12-1/18 for generalized recurrent seizures. 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  HTN, HLD, diabetes, EF 30-35%, s/p left AKA.  On Keppra and Decadron due to seizures from mets to his brain. Recent PEG tube placement on 1/14  Patient presents to the ED with acute on chronic generalized weakness.  Patient reports "just getting worse and worse" since his recent discharge.  Patient denies any pain.  Reports output from his PEG tube that appears feculent, but denies abdominal pain, tenderness or distention.  Denies emesis, shortness of breath, cough, dysuria, falls or skin lesions.  Past Medical History:  Diagnosis Date   Depression    Diabetes mellitus without complication ([place])    Hyperlipidemia    Hypertension    Lesion of brain    Lung cancer ([place])    Mass of lung    Seizures [facility])     Patient Active Problem List   Diagnosis Date Noted   Protein-calorie malnutrition, severe 01/12/2021   Seizure ([place]) 01/11/2021   HTN (hypertension) 01/11/2021   Seizures ([place])    Leucocytosis    Protein-calorie malnutrition, moderate (HCC)    Generalized weakness    Dizziness 11/08/2020   Vertigo 70/26/3785   Chronic systolic CHF (congestive heart failure) ([place]) 11/08/2020   Hypomagnesemia 11/08/2020   Hypotension 11/08/2020   Hypokalemia 11/08/2020   Nausea vomiting and diarrhea 11/08/2020   Anxiety 11/08/2020   Palliative care encounter    Chemotherapy induced neutropenia ([place]) 09/29/2020    Goals of care, counseling/discussion 07/08/2020   Malignant neoplasm of lower lobe of right lung ([place]) 07/08/2020   Brain metastases ([place]) 06/22/2020   Vasogenic brain edema ([place]) 06/21/2020   Hyperlipidemia 01/02/2016   Hypertriglyceridemia 01/02/2016   Tobacco abuse 01/02/2016   Type 2 diabetes mellitus without complication, without long-term current use of insulin ([place]) 01/02/2016   Obesity 01/02/2016   S/P AKA (above knee amputation) unilateral ([place]) 01/02/2016   Amaurosis fugax 01/01/2016   TIA (transient ischemic attack) 01/01/2016    Past Surgical History:  Procedure Laterality Date   amputation Left 2005   AKA   COLONOSCOPY     CYSTOSCOPY W/ RETROGRADES Bilateral 07/14/2020   Procedure: CYSTOSCOPY WITH RETROGRADE PYELOGRAM;  Surgeon: Hollice Espy, MD;  Location: [facility] ORS;  Service: Urology;  Laterality: Bilateral;   PORTA CATH INSERTION N/A 08/03/2020   Procedure: PORTA CATH INSERTION;  Surgeon: Katha Cabal, MD;  Location: [place] [facility];  Service: Cardiovascular;  Laterality: N/A;   PORTA CATH INSERTION N/A 10/03/2020   Procedure: PORTA CATH INSERTION;  Surgeon: Algernon Huxley, MD;  Location: [place] [facility];  Service: Cardiovascular;  Laterality: N/A;   VIDEO BRONCHOSCOPY WITH ENDOBRONCHIAL NAVIGATION N/A 07/01/2020   Procedure: VIDEO BRONCHOSCOPY WITH ENDOBRONCHIAL NAVIGATION;  Surgeon: [name], MD;  Location: [facility] ORS;  Service: Thoracic;  Laterality: N/A;   VIDEO BRONCHOSCOPY WITH ENDOBRONCHIAL ULTRASOUND N/A 07/01/2020   Procedure: VIDEO BRONCHOSCOPY WITH ENDOBRONCHIAL ULTRASOUND;  Surgeon: [name], MD;  Location: [facility] ORS;  Service: Thoracic;  Laterality: N/A;    Prior  to Admission medications   Medication Sig Start Date End Date Taking? Authorizing Provider  dexamethasone (DECADRON) 4 MG tablet Place 1 tablet (4 mg total) into feeding tube every 6 (six) hours. 01/17/21 02/16/21  Donne Hazel, MD  dronabinol (MARINOL) 5 MG  capsule Take 1 capsule (5 mg total) by mouth 2 (two) times daily before a meal. 12/05/20   [name], MD  levETIRAcetam (KEPPRA) 500 MG tablet Take 1 tablet (500 mg total) by mouth 2 (two) times daily. 01/08/21 02-18-21  Rehman, Areeg N, DO  levETIRAcetam (KEPPRA) 500 MG tablet Take 1 tablet (500 mg total) by mouth 2 (two) times daily. 01/11/21   Ward, Delice Bison, DO  metFORMIN (GLUCOPHAGE) 500 MG tablet Take 1,000 mg by mouth 2 (two) times daily with a meal. Patient not taking: No sig reported    [provider]  metoprolol succinate (TOPROL-XL) 25 MG 24 hr tablet Take 0.5 tablets (12.5 mg total) by mouth daily. 11/15/20   Kate Sable, MD  Nutritional Supplements (FEEDING SUPPLEMENT, OSMOLITE 1.5 CAL,) LIQD Place 237 mLs into feeding tube every 3 (three) hours. 01/17/21 02/16/21  Donne Hazel, MD  vitamin B-12 (CYANOCOBALAMIN) 1000 MCG tablet Take 1,000 mcg by mouth daily.    [provider]    Allergies Penicillins  Family History  Problem Relation Age of Onset   Diabetes Sister    Diabetes Brother     Social History Social History   Tobacco Use   Smoking status: Former Smoker    Packs/day: 2.00    Years: 32.00    Pack years: 64.00    Types: Cigarettes    Quit date: 06/21/2020    Years since quitting: 0.5   Smokeless tobacco: Never Used  Vaping Use   Vaping Use: Never used  Substance Use Topics   Alcohol use: No    Alcohol/week: 0.0 standard drinks   Drug use: Not Currently    Types: Marijuana    Review of Systems  Constitutional: No fever/chills.  Positive generalized weakness. Eyes: No visual changes. ENT: No sore throat. Cardiovascular: Denies chest pain. Respiratory: Denies shortness of breath. Gastrointestinal: No abdominal pain.  No nausea, no vomiting.  No diarrhea.  No constipation. Genitourinary: Negative for dysuria. Musculoskeletal: Negative for back pain. Skin: Negative for rash. Neurological: Negative for headaches, focal  weakness or numbness.  ____________________________________________   PHYSICAL EXAM:  VITAL SIGNS: Vitals:   01/20/21 2145 01/20/21 2200  BP: 129/89 (!) 141/99  Pulse: (!) 136 85  Resp: 15   Temp:    SpO2: 95% 97%     Constitutional: Alert and oriented. Well appearing and in no acute distress. Eyes: Conjunctivae are normal. PERRL. EOMI. Head: Atraumatic. Nose: No congestion/rhinnorhea. Mouth/Throat: Mucous membranes are dry.  Oropharynx non-erythematous. Neck: No stridor. No cervical spine tenderness to palpation. Cardiovascular: Normal rate, regular rhythm. Grossly normal heart sounds.  Good peripheral circulation. Respiratory: Normal respiratory effort.  No retractions. Lungs CTAB. Gastrointestinal: Soft , nondistended, nontender to palpation. No CVA tenderness. PEG tube in place to the left-sided abdomen with feculent appearing material coming from this.  Abdomen is surprisingly benign and soft. Musculoskeletal: No lower extremity tenderness nor edema.  No joint effusions. No signs of acute trauma. Left AKA Neurologic:  Normal speech and language. No gross focal neurologic deficits are appreciated.  Skin:  Skin is warm, dry and intact. No rash noted. Psychiatric: Mood and affect are normal. Speech and behavior are normal.  ____________________________________________   LABS (all labs ordered  are listed, but only abnormal results are displayed)  Labs Reviewed  BASIC METABOLIC PANEL - Abnormal; Notable for the following components:      Result Value   Sodium 133 (*)    Chloride 94 (*)    Glucose, Bld 169 (*)    Creatinine, Ser 0.42 (*)    All other components within normal limits  CBC - Abnormal; Notable for the following components:   WBC 24.3 (*)    Hemoglobin 12.9 (*)    RDW 16.3 (*)    All other components within normal limits  URINALYSIS, COMPLETE (UACMP) WITH MICROSCOPIC - Abnormal; Notable for the following components:   Color, Urine YELLOW (*)     APPearance CLEAR (*)    Specific Gravity, Urine >1.046 (*)    All other components within normal limits  LACTIC ACID, PLASMA - Abnormal; Notable for the following components:   Lactic Acid, Venous 3.2 (*)    All other components within normal limits  CULTURE, BLOOD (SINGLE)  POC SARS CORONAVIRUS 2 AG -  ED  CBG MONITORING, ED   ____________________________________________  12 Lead EKG  Sinus rhythm, rate of 103 bpm.  Normal axis intervals.  No evidence of acute ischemia.  Sinus tachycardia. ____________________________________________  RADIOLOGY  ED MD interpretation: 2 view CXR reviewed by me without evidence of acute intracranial pathology. CT abdomen/pelvis reviewed by me without evidence of SBO.  Diffuse metastases noted.  Official radiology report(s): DG Chest 2 View  Result Date: 01/20/2021 CLINICAL DATA:  Weakness with known history of lung carcinoma EXAM: CHEST - 2 VIEW COMPARISON:  01/11/2021 FINDINGS: Cardiac shadow is within normal limits. Right-sided chest wall port is seen. Lungs are hyperinflated consistent with COPD. Previously seen right lower lobe lung mass is not well appreciated on today's exam due to its proximity to the cardiac border medially. IMPRESSION: COPD without acute abnormality. Electronically Signed   By: Inez Catalina M.D.   On: 01/20/2021 16:35   CT ABDOMEN PELVIS W CONTRAST  Result Date: 01/20/2021 CLINICAL DATA:  Generalized weakness, decreased bowel movements, leaking around feeding tube, history of metastatic small cell lung cancer EXAM: CT ABDOMEN AND PELVIS WITH CONTRAST TECHNIQUE: Multidetector CT imaging of the abdomen and pelvis was performed using the standard protocol following bolus administration of intravenous contrast. CONTRAST:  167mL OMNIPAQUE IOHEXOL 300 MG/ML  SOLN COMPARISON:  11/08/2020 FINDINGS: Lower chest: Partial visualization of known right lower lobe malignancy, measuring approximately 1.6 x 1.4 cm image 1 of series 2. No  airspace disease, effusion, or pneumothorax. Hepatobiliary: Since the previous exam, marked progression of known intrahepatic metastases. Multiple large confluent lesions are seen throughout the liver, largest in the left lobe measuring 7.8 x 5.7 cm reference image 8 of series 2. This measures approximately 1.3 cm on previous exam. Multiple gallstones are identified without cholecystitis. No biliary dilation. Pancreas: Unremarkable. No pancreatic ductal dilatation or surrounding inflammatory changes. Spleen: Enlarging hypodensity ventral aspect of the spleen measuring 2.7 x 2.2 cm image 23 of series 2 consistent with progressive metastatic disease. Adrenals/Urinary Tract: Stable left renal cyst. The kidneys enhance normally and symmetrically. No urinary tract calculi or obstruction. Bladder and adrenals are unremarkable. Stomach/Bowel: No evidence of high-grade bowel obstruction or ileus. Percutaneous gastrostomy tube is seen within the gastric lumen. There is gas dissecting along the catheter tubing within the abdominal wall. No fluid collection. There is a segment of jejunum with significant wall thickening measuring up to 1.9 cm, image 42 series 2 within the left upper quadrant,  suspicious for metastatic disease. Vascular/Lymphatic: Multiple enlarged lymph nodes are seen within the central mesentery, largest measuring 2.7 by 3.5 cm image 41 of series 2, consistent with metastatic disease. Stable atherosclerosis of the abdominal aorta without aneurysm or dissection. Reproductive: Prostate is unremarkable. Other: No free fluid or free intraperitoneal gas. No abdominal wall hernia. Musculoskeletal: Metastatic lesion within the abdominal wall musculature of the right flank measuring 2.4 cm on image 38 of series 2. There is a lytic lesion within the left iliac bone abutting the anterior margin of the sacroiliac joint. Large lytic lesion partially visualized within the proximal left femoral diaphysis. These are  consistent with metastatic disease. The left femoral lesion is at risk for fracture. IMPRESSION: 1. Marked progression of metastatic disease. 2. Enlarging masses throughout the liver and spleen. 3. Metastatic adenopathy within the central mesentery and retroperitoneum. 4. Small bowel metastasis involving the jejunum within the left upper quadrant, without high-grade obstruction. 5. Bony metastatic disease as above, with large lytic lesion involving the proximal left femoral diaphysis only partially imaged on this exam. Given the degree of lucency and location, this is at risk for fracture. 6. Partially visualized right lower lobe mass consistent with primary malignancy. 7. Indwelling PEG tube. There is gas along the catheter tubing, without evidence of fluid collection or abscess. Electronically Signed   By: Randa Ngo M.D.   On: 01/20/2021 20:29    ____________________________________________   PROCEDURES and INTERVENTIONS  Procedure(s) performed (including Critical Care):  .1-3 Lead EKG Interpretation Performed by: Vladimir Crofts, MD Authorized by: Vladimir Crofts, MD     Interpretation: abnormal     ECG rate:  105   ECG rate assessment: tachycardic     Rhythm: sinus tachycardia     Ectopy: none     Conduction: normal   .Critical Care Performed by: Vladimir Crofts, MD Authorized by: Vladimir Crofts, MD   Critical care provider statement:    Critical care time (minutes):  45   Critical care was necessary to treat or prevent imminent or life-threatening deterioration of the following conditions:  Sepsis   Critical care was time spent personally by me on the following activities:  Discussions with consultants, evaluation of patient's response to treatment, examination of patient, ordering and performing treatments and interventions, ordering and review of laboratory studies, ordering and review of radiographic studies, pulse oximetry, re-evaluation of patient's condition, obtaining history from  patient or surrogate and review of old charts    Medications  iohexol (OMNIPAQUE) 9 MG/ML oral solution 1,000 mL (has no administration in time range)  lactated ringers infusion (has no administration in time range)  metroNIDAZOLE (FLAGYL) IVPB 500 mg (500 mg Intravenous New Bag/Given 01/20/21 2145)  vancomycin (VANCOREADY) IVPB 1750 mg/350 mL (has no administration in time range)  lactated ringers bolus 1,000 mL (0 mLs Intravenous Stopped 01/20/21 2103)  iohexol (OMNIPAQUE) 300 MG/ML solution 100 mL (100 mLs Intravenous Contrast Given 01/20/21 2006)  lactated ringers bolus 2,000 mL (2,000 mLs Intravenous New Bag/Given 01/20/21 2103)  ceFEPIme (MAXIPIME) 2 g in sodium chloride 0.9 % 100 mL IVPB (0 g Intravenous Stopped 01/20/21 2142)  morphine 4 MG/ML injection 4 mg (4 mg Intravenous Given 01/20/21 2205)    ____________________________________________   MDM / ED COURSE   60 year old male with metastatic cancer [place] to the ED with signs of sepsis requiring medical admission.  Hemodynamically stable with sinus tachycardia.  Exam without nidus of infection easily identified.  He does have what appears to be feculent material coming  from his PEG tube, but a benign abdomen beneath this.  Due to his lack of stool for the past few days with this, certainly concern for SBO, and CT rules this out.  Blood work with leukocytosis and lactic acidosis.  Sepsis protocols followed.  Uncertain etiology of his infection, but clear signs of sepsis.  We will admit to hospitalist medicine for further work-up and management.      ____________________________________________   FINAL CLINICAL IMPRESSION(S) / ED DIAGNOSES  Final diagnoses:  Sepsis without acute organ dysfunction, due to unspecified organism ([place])  Weakness     ED Discharge Orders    None       Amiri Tritch   Note:  This document was prepared using Dragon voice recognition software and may include unintentional dictation errors.    Vladimir Crofts, MD 01/20/21 2242

## 2021-01-20 NOTE — ED Notes (Signed)
Date and time results received: 01/20/21 6:06 PM  (use smartphrase ".now" to insert current time)  Test: Lactic Critical Value: 3.2  Name of Provider Notified: Charlsie Quest, MD  Orders Received? Or Actions Taken?: Orders will be entered by EDP

## 2021-01-20 NOTE — Progress Notes (Signed)
Notified provider of need to order repeat lactic acid. ° °

## 2021-01-21 DIAGNOSIS — Z66 Do not resuscitate: Secondary | ICD-10-CM | POA: Diagnosis present

## 2021-01-21 DIAGNOSIS — E119 Type 2 diabetes mellitus without complications: Secondary | ICD-10-CM | POA: Diagnosis present

## 2021-01-21 DIAGNOSIS — R5381 Other malaise: Secondary | ICD-10-CM | POA: Diagnosis not present

## 2021-01-21 DIAGNOSIS — Z4682 Encounter for fitting and adjustment of non-vascular catheter: Secondary | ICD-10-CM | POA: Diagnosis not present

## 2021-01-21 DIAGNOSIS — Z515 Encounter for palliative care: Secondary | ICD-10-CM | POA: Diagnosis not present

## 2021-01-21 DIAGNOSIS — C799 Secondary malignant neoplasm of unspecified site: Secondary | ICD-10-CM | POA: Diagnosis present

## 2021-01-21 DIAGNOSIS — C784 Secondary malignant neoplasm of small intestine: Secondary | ICD-10-CM | POA: Diagnosis present

## 2021-01-21 DIAGNOSIS — Z7401 Bed confinement status: Secondary | ICD-10-CM | POA: Diagnosis not present

## 2021-01-21 DIAGNOSIS — K6389 Other specified diseases of intestine: Secondary | ICD-10-CM | POA: Diagnosis not present

## 2021-01-21 DIAGNOSIS — Z20822 Contact with and (suspected) exposure to covid-19: Secondary | ICD-10-CM | POA: Diagnosis present

## 2021-01-21 DIAGNOSIS — Z880 Allergy status to penicillin: Secondary | ICD-10-CM | POA: Diagnosis not present

## 2021-01-21 DIAGNOSIS — C787 Secondary malignant neoplasm of liver and intrahepatic bile duct: Secondary | ICD-10-CM

## 2021-01-21 DIAGNOSIS — C7951 Secondary malignant neoplasm of bone: Secondary | ICD-10-CM | POA: Diagnosis present

## 2021-01-21 DIAGNOSIS — I110 Hypertensive heart disease with heart failure: Secondary | ICD-10-CM | POA: Diagnosis present

## 2021-01-21 DIAGNOSIS — K9420 Gastrostomy complication, unspecified: Secondary | ICD-10-CM

## 2021-01-21 DIAGNOSIS — C3431 Malignant neoplasm of lower lobe, right bronchus or lung: Secondary | ICD-10-CM

## 2021-01-21 DIAGNOSIS — C7931 Secondary malignant neoplasm of brain: Secondary | ICD-10-CM

## 2021-01-21 DIAGNOSIS — K5900 Constipation, unspecified: Secondary | ICD-10-CM | POA: Diagnosis present

## 2021-01-21 DIAGNOSIS — R627 Adult failure to thrive: Secondary | ICD-10-CM | POA: Diagnosis present

## 2021-01-21 DIAGNOSIS — G40909 Epilepsy, unspecified, not intractable, without status epilepticus: Secondary | ICD-10-CM | POA: Diagnosis present

## 2021-01-21 DIAGNOSIS — E871 Hypo-osmolality and hyponatremia: Secondary | ICD-10-CM | POA: Diagnosis present

## 2021-01-21 DIAGNOSIS — K9423 Gastrostomy malfunction: Secondary | ICD-10-CM | POA: Diagnosis present

## 2021-01-21 DIAGNOSIS — C779 Secondary and unspecified malignant neoplasm of lymph node, unspecified: Secondary | ICD-10-CM | POA: Diagnosis present

## 2021-01-21 DIAGNOSIS — M2550 Pain in unspecified joint: Secondary | ICD-10-CM | POA: Diagnosis not present

## 2021-01-21 DIAGNOSIS — Z6820 Body mass index (BMI) 20.0-20.9, adult: Secondary | ICD-10-CM | POA: Diagnosis not present

## 2021-01-21 DIAGNOSIS — E872 Acidosis: Secondary | ICD-10-CM | POA: Diagnosis present

## 2021-01-21 DIAGNOSIS — E785 Hyperlipidemia, unspecified: Secondary | ICD-10-CM | POA: Diagnosis present

## 2021-01-21 DIAGNOSIS — Z923 Personal history of irradiation: Secondary | ICD-10-CM | POA: Diagnosis not present

## 2021-01-21 DIAGNOSIS — I5022 Chronic systolic (congestive) heart failure: Secondary | ICD-10-CM | POA: Diagnosis present

## 2021-01-21 DIAGNOSIS — E43 Unspecified severe protein-calorie malnutrition: Secondary | ICD-10-CM | POA: Diagnosis present

## 2021-01-21 DIAGNOSIS — Z9221 Personal history of antineoplastic chemotherapy: Secondary | ICD-10-CM | POA: Diagnosis not present

## 2021-01-21 LAB — COMPREHENSIVE METABOLIC PANEL
ALT: 64 U/L — ABNORMAL HIGH (ref 0–44)
AST: 23 U/L (ref 15–41)
Albumin: 2.4 g/dL — ABNORMAL LOW (ref 3.5–5.0)
Alkaline Phosphatase: 215 U/L — ABNORMAL HIGH (ref 38–126)
Anion gap: 12 (ref 5–15)
BUN: 16 mg/dL (ref 6–20)
CO2: 28 mmol/L (ref 22–32)
Calcium: 8.5 mg/dL — ABNORMAL LOW (ref 8.9–10.3)
Chloride: 97 mmol/L — ABNORMAL LOW (ref 98–111)
Creatinine, Ser: 0.41 mg/dL — ABNORMAL LOW (ref 0.61–1.24)
GFR, Estimated: >60 mL/min (ref >60)
Glucose, Bld: 90 mg/dL (ref 70–99)
Potassium: 3.5 mmol/L (ref 3.5–5.1)
Sodium: 137 mmol/L (ref 135–145)
Total Bilirubin: 0.9 mg/dL (ref 0.3–1.2)
Total Protein: 5.4 g/dL — ABNORMAL LOW (ref 6.5–8.1)

## 2021-01-21 LAB — CBC
HCT: 35.5 % — ABNORMAL LOW (ref 39.0–52.0)
Hemoglobin: 11.5 g/dL — ABNORMAL LOW (ref 13.0–17.0)
MCH: 29.7 pg (ref 26.0–34.0)
MCHC: 32.4 g/dL (ref 30.0–36.0)
MCV: 91.7 fL (ref 80.0–100.0)
Platelets: 171 10*3/uL (ref 150–400)
RBC: 3.87 MIL/uL — ABNORMAL LOW (ref 4.22–5.81)
RDW: 16.3 % — ABNORMAL HIGH (ref 11.5–15.5)
WBC: 18.6 10*3/uL — ABNORMAL HIGH (ref 4.0–10.5)
nRBC: 0 % (ref 0.0–0.2)

## 2021-01-21 LAB — LACTIC ACID, PLASMA
Lactic Acid, Venous: 3.1 mmol/L (ref 0.5–1.9)
Lactic Acid, Venous: 2.7 mmol/L (ref 0.5–1.9)

## 2021-01-21 LAB — PROCALCITONIN: Procalcitonin: 0.1 ng/mL

## 2021-01-21 LAB — HEMOGLOBIN
Hemoglobin: 11.9 g/dL — ABNORMAL LOW (ref 13.0–17.0)
Hemoglobin: 12.3 g/dL — ABNORMAL LOW (ref 13.0–17.0)

## 2021-01-21 LAB — CBG MONITORING, ED: Glucose-Capillary: 78 mg/dL (ref 70–99)

## 2021-01-21 LAB — TSH: TSH: 2.395 u[IU]/mL (ref 0.350–4.500)

## 2021-01-21 MED ORDER — ACETAMINOPHEN 650 MG RE SUPP: 650.0000 mg | Freq: Four times a day (QID) | RECTAL | Status: DC | PRN

## 2021-01-21 MED ORDER — DRONABINOL 2.5 MG PO CAPS
5.0000 mg | ORAL_CAPSULE | Freq: Two times a day (BID) | ORAL | Status: DC
  Administered 2021-01-21 – 2021-01-24 (×6): 5 mg via ORAL
  Filled 2021-01-21 (×3): qty 2
  Filled 2021-01-21: qty 1
  Filled 2021-01-21: qty 2
  Filled 2021-01-21: qty 1
  Filled 2021-01-21 (×4): qty 2

## 2021-01-21 MED ORDER — SODIUM CHLORIDE 0.9 % IV SOLN: INTRAVENOUS | Status: AC

## 2021-01-21 MED ORDER — LEVETIRACETAM 500 MG PO TABS
500.0000 mg | ORAL_TABLET | Freq: Two times a day (BID) | ORAL | Status: DC
  Administered 2021-01-21 – 2021-01-22 (×5): 500 mg via ORAL
  Filled 2021-01-21 (×6): qty 1

## 2021-01-21 MED ORDER — DEXAMETHASONE 4 MG PO TABS
4.0000 mg | ORAL_TABLET | Freq: Four times a day (QID) | ORAL | Status: DC
  Administered 2021-01-21 – 2021-01-22 (×6): 4 mg
  Filled 2021-01-21 (×10): qty 1

## 2021-01-21 MED ORDER — ONDANSETRON HCL 4 MG/2ML IJ SOLN: 4.0000 mg | Freq: Four times a day (QID) | INTRAMUSCULAR | Status: DC | PRN

## 2021-01-21 MED ORDER — ZINC OXIDE 40 % EX OINT
TOPICAL_OINTMENT | Freq: Every day | CUTANEOUS | Status: DC
  Filled 2021-01-21 (×2): qty 113

## 2021-01-21 MED ORDER — ONDANSETRON HCL 4 MG PO TABS: 4.0000 mg | ORAL_TABLET | Freq: Four times a day (QID) | ORAL | Status: DC | PRN

## 2021-01-21 MED ORDER — VANCOMYCIN HCL 1250 MG/250ML IV SOLN
1250.0000 mg | Freq: Two times a day (BID) | INTRAVENOUS | Status: DC
  Administered 2021-01-21 – 2021-01-23 (×5): 1250 mg via INTRAVENOUS
  Filled 2021-01-21 (×7): qty 250

## 2021-01-21 MED ORDER — MORPHINE SULFATE (PF) 2 MG/ML IV SOLN
2.0000 mg | INTRAVENOUS | Status: DC | PRN
  Administered 2021-01-21 – 2021-01-23 (×8): 2 mg via INTRAVENOUS
  Filled 2021-01-21 (×8): qty 1

## 2021-01-21 MED ORDER — OSMOLITE 1.5 CAL PO LIQD: 237.0000 mL | ORAL | Status: DC

## 2021-01-21 MED ORDER — LEVETIRACETAM 500 MG PO TABS: 500.0000 mg | ORAL_TABLET | Freq: Two times a day (BID) | ORAL | Status: DC

## 2021-01-21 MED ORDER — ACETAMINOPHEN 325 MG PO TABS: 650.0000 mg | ORAL_TABLET | Freq: Four times a day (QID) | ORAL | Status: DC | PRN

## 2021-01-21 MED ORDER — SODIUM CHLORIDE 0.9 % IV SOLN
2.0000 g | Freq: Three times a day (TID) | INTRAVENOUS | Status: DC
  Administered 2021-01-21 – 2021-01-24 (×10): 2 g via INTRAVENOUS
  Filled 2021-01-21 (×12): qty 2

## 2021-01-21 MED ORDER — VITAMIN B-12 1000 MCG PO TABS
1000.0000 ug | ORAL_TABLET | Freq: Every day | ORAL | Status: DC
  Administered 2021-01-21 – 2021-01-22 (×2): 1000 ug via ORAL
  Filled 2021-01-21 (×4): qty 1

## 2021-01-21 MED ORDER — METOPROLOL SUCCINATE ER 25 MG PO TB24
12.5000 mg | ORAL_TABLET | Freq: Every day | ORAL | Status: DC
  Administered 2021-01-21 – 2021-01-22 (×2): 12.5 mg via ORAL
  Filled 2021-01-21: qty 0.5
  Filled 2021-01-21 (×2): qty 1
  Filled 2021-01-21 (×2): qty 0.5

## 2021-01-21 MED ORDER — POLYETHYLENE GLYCOL 3350 17 G PO PACK: 17.0000 g | PACK | Freq: Every day | ORAL | Status: DC | PRN

## 2021-01-21 NOTE — ED Notes (Signed)
Waiting on meds from pharmacy at this time.

## 2021-01-21 NOTE — ED Notes (Signed)
Changed pt bandage, it was saturated with brown liquid. Some blood noted.

## 2021-01-21 NOTE — ED Notes (Signed)
Called IR and paged the doctor on call to see when pt would go to IR. Stated procedure will more than likely not happen this weekend, more than likely will be Monday. Last time tube was flushed was yesterday, pt states it leaked and burned. Pt states he flushed tube yesterday before coming to ER.  IR MD on call stating to continue giving antibiotics for cellulitis around site and was informed that a CT was already performed on abd to check placement.

## 2021-01-21 NOTE — Consult Note (Signed)
[facility] CONSULT NOTE  Patient Care Team: Juluis Pitch, MD as PCP - General (Family Medicine) Kate Sable, MD as PCP - Cardiology (Cardiology) Telford Nab, RN as Oncology Nurse Navigator Vladimir Crofts, MD as Consulting Physician (Neurology)  CHIEF COMPLAINTS/PURPOSE OF CONSULTATION: Metastatic lung cancer  HISTORY OF PRESENTING ILLNESS:  Tyler Gallagher 60 y.o.  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  Complains of pain around the around the PEG tube site.   Patient complains of worsening shortness of breath.  No fever no chills poor appetite.  Weight loss.    On admission CT scan of the abdomen pelvis shows progressive malignancy in the liver/pelvis  Review of Systems  Constitutional: Positive for malaise/fatigue and weight loss. Negative for chills, diaphoresis and fever.  HENT: Negative for nosebleeds and sore throat.   Eyes: Negative for double vision.  Respiratory: Positive for cough and shortness of breath. Negative for hemoptysis, sputum production and wheezing.   Cardiovascular: Negative for chest pain, palpitations, orthopnea and leg swelling.  Gastrointestinal: Positive for abdominal pain and diarrhea. Negative for blood in stool, heartburn, melena, nausea and vomiting.  Genitourinary: Negative for dysuria, frequency and urgency.  Musculoskeletal: Positive for back pain and joint pain.  Skin: Negative.  Negative for itching and rash.  Neurological: Positive for weakness. Negative for dizziness, tingling, focal weakness and headaches.  Endo/Heme/Allergies: Does not bruise/bleed easily.  Psychiatric/Behavioral: Positive for depression. The patient is not nervous/anxious and does not have  insomnia.      MEDICAL HISTORY:  Past Medical History:  Diagnosis Date  . Depression   . Diabetes mellitus without complication ([place])   . Hyperlipidemia   . Hypertension   . Lesion of brain   . Lung cancer ([place])   . Mass of lung   . Seizures ([place])     SURGICAL HISTORY: Past Surgical History:  Procedure Laterality Date  . 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 COLONOSCOPY    . CYSTOSCOPY W/ RETROGRADES Bilateral 07/14/2020   Procedure: CYSTOSCOPY WITH RETROGRADE PYELOGRAM;  Surgeon: Hollice Espy, MD;  Location: [facility];  Service: Urology;  Laterality: Bilateral;  . PORTA CATH INSERTION N/A 08/03/2020   Procedure: PORTA CATH INSERTION;  Surgeon: Katha Cabal, MD;  Location: [facility];  Service: Cardiovascular;  Laterality: N/A;  . PORTA CATH INSERTION N/A 10/03/2020   Procedure: PORTA CATH INSERTION;  Surgeon: Algernon Huxley, MD;  Location: [facility];  Service: Cardiovascular;  Laterality: N/A;  . VIDEO BRONCHOSCOPY WITH ENDOBRONCHIAL NAVIGATION N/A 07/01/2020   Procedure: VIDEO BRONCHOSCOPY WITH ENDOBRONCHIAL NAVIGATION;  Surgeon: [name], MD;  Location: [facility];  Service: Thoracic;  Laterality: N/A;  . VIDEO BRONCHOSCOPY WITH ENDOBRONCHIAL ULTRASOUND N/A 07/01/2020   Procedure: VIDEO BRONCHOSCOPY WITH ENDOBRONCHIAL ULTRASOUND;  Surgeon: [name], MD;  Location: [facility];  Service: Thoracic;  Laterality: N/A;    SOCIAL HISTORY: Social History   Socioeconomic History  . Marital status: Divorced    Spouse name: Not on file  . Number of children: Not on file  . Years of education: Not on file  . Highest education level: Not on file  Occupational History  . Not on file  Tobacco Use  . Smoking status: Former Smoker    Packs/day: 2.00  Years: 32.00    Pack years: 64.00    Types: Cigarettes    Quit date: 06/21/2020    Years since quitting: 0.5  . Smokeless tobacco: Never Used  Vaping Use  . Vaping Use: Never used  Substance and Sexual  Activity  . Alcohol use: No    Alcohol/week: 0.0 standard drinks  . Drug use: Not Currently    Types: Marijuana  . Sexual activity: Not on file  Other Topics Concern  . Not on file  Social History Narrative   Lives at home with girlfriend   Social Determinants of Health   Financial Resource Strain: Not on file  Food Insecurity: Not on file  Transportation Needs: Not on file  Physical Activity: Not on file  Stress: Not on file  Social Connections: Not on file  Intimate Partner Violence: Not on file    FAMILY HISTORY: Family History  Problem Relation Age of Onset  . Diabetes Sister   . Diabetes Brother     ALLERGIES:  is allergic to penicillins.  MEDICATIONS:  Current Facility-Administered Medications  Medication Dose Route Frequency Provider Last Rate Last Admin  . acetaminophen (TYLENOL) tablet 650 mg  650 mg Oral Q6H PRN Clance Boll, MD       Or  . acetaminophen (TYLENOL) suppository 650 mg  650 mg Rectal Q6H PRN Myles Rosenthal A, MD      . ceFEPIme (MAXIPIME) 2 g in sodium chloride 0.9 % 100 mL IVPB  2 g Intravenous Q8H Hall, Scott A, RPH   Stopped at 01/21/21 2147  . dexamethasone (DECADRON) tablet 4 mg  4 mg Per Tube Q6H Clance Boll, MD   4 mg at 01/21/21 2056  . dronabinol (MARINOL) capsule 5 mg  5 mg Oral BID AC Myles Rosenthal A, MD   5 mg at 01/21/21 1747  . feeding supplement (OSMOLITE 1.5 CAL) liquid 237 mL  237 mL Per Tube Q3H Clance Boll, MD   Held at 01/21/21 0123  . iohexol (OMNIPAQUE) 9 MG/ML oral solution 1,000 mL  1,000 mL Oral Once PRN Vladimir Crofts, MD      . levETIRAcetam (KEPPRA) tablet 500 mg  500 mg Oral BID Myles Rosenthal A, MD   500 mg at 01/21/21 2148  . liver oil-zinc oxide (DESITIN) 40 % ointment   Topical Daily [place], Isami, DO   Given at 01/21/21 2055  . metoprolol succinate (TOPROL-XL) 24 hr tablet 12.5 mg  12.5 mg Oral Daily Myles Rosenthal A, MD   12.5 mg at 01/21/21 0931  . morphine 2 MG/ML injection 2 mg   2 mg Intravenous Q3H PRN Lang Snow, NP   2 mg at 01/21/21 1828  . ondansetron (ZOFRAN) tablet 4 mg  4 mg Oral Q6H PRN Clance Boll, MD       Or  . ondansetron [facility]) injection 4 mg  4 mg Intravenous Q6H PRN Myles Rosenthal A, MD      . polyethylene glycol (MIRALAX / GLYCOLAX) packet 17 g  17 g Oral Daily PRN Clance Boll, MD      . vancomycin (VANCOREADY) IVPB 1250 mg/250 mL  1,250 mg Intravenous Q12H Hart Robinsons A, RPH   Stopped at 01/21/21 1104  . vitamin B-12 (CYANOCOBALAMIN) tablet 1,000 mcg  1,000 mcg Oral Daily Clance Boll, MD   1,000 mcg at 01/21/21 3710   Current Outpatient Medications  Medication Sig Dispense Refill  . dexamethasone (DECADRON) 4 MG tablet Place 1 tablet (4  mg total) into feeding tube every 6 (six) hours. 120 tablet 0  . dronabinol (MARINOL) 5 MG capsule Take 1 capsule (5 mg total) by mouth 2 (two) times daily before a meal. 60 capsule 0  . levETIRAcetam (KEPPRA) 500 MG tablet Take 1 tablet (500 mg total) by mouth 2 (two) times daily. 60 tablet 0  . metoprolol succinate (TOPROL-XL) 25 MG 24 hr tablet Take 0.5 tablets (12.5 mg total) by mouth daily. 15 tablet 5  . vitamin B-12 (CYANOCOBALAMIN) 1000 MCG tablet Take 1,000 mcg by mouth daily.    . Nutritional Supplements (FEEDING SUPPLEMENT, OSMOLITE 1.5 CAL,) LIQD Place 237 mLs into feeding tube every 3 (three) hours. 10932 mL 0   Facility-Administered Medications Ordered in Other Encounters  Medication Dose Route Frequency Provider Last Rate Last Admin  . heparin lock flush 100 unit/mL  500 Units Intravenous Once Charlaine Dalton R, MD      . sodium chloride flush (NS) 0.9 % injection 10 mL  10 mL Intravenous Once Charlaine Dalton R, MD          .  PHYSICAL EXAMINATION:  Vitals:   01/21/21 2029 01/21/21 2030  BP: 129/84 129/84  Pulse: 87 71  Resp: (!) 21 19  Temp: 98 F (36.7 C)   SpO2: 98% 96%   Filed Weights   01/20/21 1313  Weight: 160 lb (72.6 kg)     Physical Exam Constitutional:      Comments: Ill-appearing Caucasian male patient.  No acute distress.  Alone.  HENT:     Head: Normocephalic and atraumatic.     Mouth/Throat:     Mouth: Oropharynx is clear and moist.     Pharynx: No oropharyngeal exudate.  Eyes:     Pupils: Pupils are equal, round, and reactive to light.  Cardiovascular:     Rate and Rhythm: Normal rate and regular rhythm.  Pulmonary:     Effort: No respiratory distress.     Breath sounds: No wheezing.     Comments: Decreased air entry bilaterally.  No wheeze or crackles. Abdominal:     General: Bowel sounds are normal. There is no distension.     Palpations: Abdomen is soft. There is no mass.     Tenderness: There is no abdominal tenderness. There is no guarding or rebound.     Comments: PEG tube in place.  Erythema around the site of insertion.  Leakage noted.   Musculoskeletal:        General: No tenderness or edema. Normal range of motion.     Cervical back: Normal range of motion and neck supple.  Skin:    General: Skin is warm.  Neurological:     Mental Status: He is alert and oriented to person, place, and time.  Psychiatric:        Mood and Affect: Affect normal.      LABORATORY DATA:  I have reviewed the data as listed Lab Results  Component Value Date   WBC 18.6 (H) 01/21/2021   HGB 12.3 (L) 01/21/2021   HCT 35.5 (L) 01/21/2021   MCV 91.7 01/21/2021   PLT 171 01/21/2021   Recent Labs    09/15/20 0825 09/22/20 0819 09/29/20 0813 10/05/20 1410 01/16/21 0453 01/17/21 0450 01/20/21 1320 01/21/21 0452  NA 134* 128* 132*   < > 139 138 133* 137  K 3.4* 3.2* 3.3*   < > 3.9 4.2 4.1 3.5  CL 96* 92* 97*   < > 101 102 94* 97*  CO2 29 27 24    < > 30 27 25 28   GLUCOSE 155* 113* 138*   < > 113* 123* 169* 90  BUN 13 17 11    < > 8 13 15 16   CREATININE 0.58* 0.55* 0.55*   < > 0.41* <0.30* 0.42* 0.41*  CALCIUM 9.3 8.8* 9.0   < > 8.6* 8.7* 9.2 8.5*  GFRNONAA >60 >60 >60   < > >60 NOT  CALCULATED >60 >60  GFRAA >60 >60 >60  --   --   --   --   --   PROT 7.4 6.9 7.2   < > 6.1* 6.2*  --  5.4*  ALBUMIN 3.8 3.8 3.7   < > 2.6* 2.7*  --  2.4*  AST 14* 17 23   < > 21 33  --  23  ALT 30 33 54*   < > 35 44  --  64*  ALKPHOS 64 58 68   < > 172* 218*  --  215*  BILITOT 0.5 0.8 1.2   < > 0.6 0.7  --  0.9   < > = values in this interval not displayed.    RADIOGRAPHIC STUDIES: I have personally reviewed the radiological images as listed and agreed with the findings in the report. DG Chest 2 View  Result Date: 01/20/2021 CLINICAL DATA:  Weakness with known history of lung carcinoma EXAM: CHEST - 2 VIEW COMPARISON:  01/11/2021 FINDINGS: Cardiac shadow is within normal limits. Right-sided chest wall port is seen. Lungs are hyperinflated consistent with COPD. Previously seen right lower lobe lung mass is not well appreciated on today's exam due to its proximity to the cardiac border medially. IMPRESSION: COPD without acute abnormality. Electronically Signed   By: Inez Catalina M.D.   On: 01/20/2021 16:35   DG Chest 2 View  Result Date: 01/11/2021 CLINICAL DATA:  Tachycardia.  History of lung carcinoma EXAM: CHEST - 2 VIEW COMPARISON:  Chest radiograph August 18, 2020; chest CT November 08, 2020 FINDINGS: Port-A-Cath tip is in the superior vena cava. No pneumothorax. There is underlying emphysematous change with apparent bullous disease in the upper lobes. There is no edema or airspace opacity. The previously noted right lower lobe mass seen on most recent CT is not appreciable by radiography. Heart size is normal. Pulmonary vascularity reflects underlying emphysematous change. No adenopathy. No bone lesions. IMPRESSION: Port-A-Cath tip in superior vena cava. No pneumothorax. Underlying emphysematous change. No edema or airspace opacity. Right lower lobe mass noted on previous CT not appreciable by radiography. Heart size within normal limits. No adenopathy appreciable by radiography. Emphysema  (ICD10-J43.9). Electronically Signed   By: Lowella Grip III M.D.   On: 01/11/2021 08:27   MR BRAIN W WO CONTRAST  Result Date: 01/06/2021 CLINICAL DATA:  Right-sided weakness, history of metastatic lung cancer with history of resection and whole brain radiation EXAM: MRI HEAD WITHOUT AND WITH CONTRAST TECHNIQUE: Multiplanar, multiecho pulse sequences of the brain and surrounding structures were obtained without and with intravenous contrast. CONTRAST:  17mL GADAVIST GADOBUTROL 1 MMOL/ML IV SOLN COMPARISON:  11/08/2020 FINDINGS: Brain: In the parasagittal left parietal lobe, there is a heterogeneously enhancing, partially necrotic lesion measuring 1.6 x 1.2 cm at the location of prior 0.6 cm lesion. There is extensive surrounding edema. Slightly increased enhancement in the parasagittal left frontal lobe at the inferior margin of the resection cavity. The punctate foci of enhancement in the left frontoparietal region on the prior study are not seen although  there is greater motion artifact on this study. 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 There is no acute infarction. No hydrocephalus. No significant mass effect. Vascular: Major vessel flow voids at the skull base are preserved. Skull and upper cervical spine: Left vertex craniotomy. Normal marrow signal is preserved. Sinuses/Orbits: Paranasal sinuses are aerated. Orbits are unremarkable. Other: Sella is unremarkable.  Mastoid air cells are clear. IMPRESSION: Increase in size and heterogeneity of parasagittal left parietal lesion with new extensive surrounding edema. Likely reflects progression of metastatic disease. However, given history of radiation, disproportionate edema, and appearance of enhancement, treatment effect is a consideration. Punctate foci of enhancement in the left frontoparietal region are no longer seen. Electronically Signed   By: Macy Mis M.D.   On: 01/06/2021 17:32   CT ABDOMEN PELVIS W  CONTRAST  Result Date: 01/20/2021 CLINICAL DATA:  Generalized weakness, decreased bowel movements, leaking around feeding tube, history of metastatic small cell lung cancer EXAM: CT ABDOMEN AND PELVIS WITH CONTRAST TECHNIQUE: Multidetector CT imaging of the abdomen and pelvis was performed using the standard protocol following bolus administration of intravenous contrast. CONTRAST:  181mL OMNIPAQUE IOHEXOL 300 MG/ML  SOLN COMPARISON:  11/08/2020 FINDINGS: Lower chest: Partial visualization of known right lower lobe malignancy, measuring approximately 1.6 x 1.4 cm image 1 of series 2. No airspace disease, effusion, or pneumothorax. Hepatobiliary: Since the previous exam, marked progression of known intrahepatic metastases. Multiple large confluent lesions are seen throughout the liver, largest in the left lobe measuring 7.8 x 5.7 cm reference image 8 of series 2. This measures approximately 1.3 cm on previous exam. Multiple gallstones are identified without cholecystitis. No biliary dilation. Pancreas: Unremarkable. No pancreatic ductal dilatation or surrounding inflammatory changes. Spleen: Enlarging hypodensity ventral aspect of the spleen measuring 2.7 x 2.2 cm image 23 of series 2 consistent with progressive metastatic disease. Adrenals/Urinary Tract: Stable left renal cyst. The kidneys enhance normally and symmetrically. No urinary tract calculi or obstruction. Bladder and adrenals are unremarkable. Stomach/Bowel: No evidence of high-grade bowel obstruction or ileus. Percutaneous gastrostomy tube is seen within the gastric lumen. There is gas dissecting along the catheter tubing within the abdominal wall. No fluid collection. There is a segment of jejunum with significant wall thickening measuring up to 1.9 cm, image 42 series 2 within the left upper quadrant, suspicious for metastatic disease. Vascular/Lymphatic: Multiple enlarged lymph nodes are seen within the central mesentery, largest measuring 2.7 by 3.5  cm image 41 of series 2, consistent with metastatic disease. Stable atherosclerosis of the abdominal aorta without aneurysm or dissection. Reproductive: Prostate is unremarkable. Other: No free fluid or free intraperitoneal gas. No abdominal wall hernia. Musculoskeletal: Metastatic lesion within the abdominal wall musculature of the right flank measuring 2.4 cm on image 38 of series 2. There is a lytic lesion within the left iliac bone abutting the anterior margin of the sacroiliac joint. Large lytic lesion partially visualized within the proximal left femoral diaphysis. These are consistent with metastatic disease. The left femoral lesion is at risk for fracture. IMPRESSION: 1. Marked progression of metastatic disease. 2. Enlarging masses throughout the liver and spleen. 3. Metastatic adenopathy within the central mesentery and retroperitoneum. 4. Small bowel metastasis involving the jejunum within the left upper quadrant, without high-grade obstruction. 5. Bony metastatic disease as above, with large lytic lesion involving the proximal left femoral diaphysis only partially imaged on this exam. Given the degree of lucency and location, this is at risk for fracture. 6. Partially visualized right  lower lobe mass consistent with primary malignancy. 7. Indwelling PEG tube. There is gas along the catheter tubing, without evidence of fluid collection or abscess. Electronically Signed   By: Randa Ngo M.D.   On: 01/20/2021 20:29   CT HEAD CODE STROKE WO CONTRAST  Result Date: 01/06/2021 CLINICAL DATA:  Code stroke.  Neuro deficit, acute stroke suspected. EXAM: CT HEAD WITHOUT CONTRAST TECHNIQUE: Contiguous axial images were obtained from the base of the skull through the vertex without intravenous contrast. COMPARISON:  MRI November 08, 2020.  CT head June 21, 2020. FINDINGS: Brain: There is new exuberant vasogenic edema in the high left parietal lobe with suspected mass in this region (see series 3, image 24).  Local sulcal effacement without midline shift. Small area of encephalomalacia in the high right parafalcine region, compatible with sequela of prior metastatic lesion resection. Remote right frontal infarct with encephalomalacia. No evidence of acute large vascular territory infarct. No acute hemorrhage. No hydrocephalus. Additional patchy white matter hypoattenuation, most likely related to chronic microvascular ischemic disease Vascular: Calcific atherosclerosis. Skull: No acute fracture. Sinuses/Orbits: Mild paranasal sinus mucosal thickening with polyp versus retention cyst in the right maxillary sinus. Other: Small left mastoid effusion. IMPRESSION: New vasogenic edema in the high left parietal lobe with suspected mass in this region, concerning for progression of the patient's known metastatic disease. Local sulcal effacement without midline shift. Recommend MRI with contrast to further evaluate. Findings discussed with Dr. Milas Gain via telephone at 2:46 p.m. Electronically Signed   By: Margaretha Sheffield MD   On: 01/06/2021 14:49    Malignant neoplasm of lower lobe of right lung [facility]) #60 year old male patient with a history of metastatic lung cancer to brain/liver most recently on Keytruda is currently admitted to hospital for worsening generalized weakness/leakage around the PEG tube site.  #Metastatic lung cancer-brain and liver.  Currently on Keytruda.  CT scan-abdomen pelvis on admission shows progressive multiple liver lesions/progressive disease in the pelvis/jejunum/lytic lesion involving proximal femur.  See discussion below  # PEG tube malfunction-leakage around the PEG tube site-question infection.  Biotics.  #Failure to thrive/secondary to progressive malignancy/declining performance status.  #DNR/DNI.  Recommendations:   #Unfortunately patient has progressive disease on immunotherapy.  Given his declining performance status/comorbidities-options are limited.  Patient understands that he  is "dying".  For now recommend continued medical care.  However it is reasonable to reassess patient's goals of care when patient's primary oncologist available on 1/24.   # I recommend evaluation with surgery with regards to leaking PEG tube.  Thank you Dr.Kumar for allowing me to participate in the care of your pleasant patient. Please do not hesitate to contact me with questions or concerns in the interim.  Discussed with Dr. Dwyane Dee.  # I reviewed the blood work- with the patient in detail; also reviewed the imaging independently [as summarized above]; and with the patient in detail.  All questions were answered. The patient knows to call the clinic with any problems, questions or concerns.    Tyler Sickle, MD 01/21/2021 10:48 PM

## 2021-01-21 NOTE — ED Notes (Signed)
Wound gauze changed at this time. Pt skin cleaned. Pt skin is red and irritated. Pt tolerates cleaning well.

## 2021-01-21 NOTE — ED Notes (Addendum)
Dressing on G tube changed at this time. Saturated with blood.

## 2021-01-21 NOTE — Consult Note (Signed)
Subjective:   CC: Leaking G-tube  HPI:  Tyler Gallagher is a 60 y.o. male who was consulted by Dwyane Dee for evaluation of above.  First noted a few days ago.  Symptoms include: Pain is sharp, around the recent gastrostomy tube insertion site.  Exacerbated by constant drainage from the area.  Alleviated by nothing specific.    Patient states tube has been able to be flushed recently, noticed brownish discharge from around the tube with gradual increase in amount.   Past Medical History:  has a past medical history of Depression, Diabetes mellitus without complication ([place]), Hyperlipidemia, Hypertension, Lesion of brain, Lung cancer ([place]), Mass of lung, and Seizures ([place]).  Past Surgical History:  has a past surgical history that includes amputation (Left, 2005); Colonoscopy; Video bronchoscopy with endobronchial ultrasound (N/A, 07/01/2020); Video bronchoscopy with endobronchial navigation (N/A, 07/01/2020); Cystoscopy w/ retrogrades (Bilateral, 07/14/2020); PORTA CATH INSERTION (N/A, 08/03/2020); and PORTA CATH INSERTION (N/A, 10/03/2020).  Family History: family history includes Diabetes in his brother and sister.  Social History:  reports that he quit smoking about 7 months ago. His smoking use included cigarettes. He has a 64.00 pack-year smoking history. He has never used smokeless tobacco. He reports previous drug use. Drug: Marijuana. He reports that he does not drink alcohol.  Current Medications:  Prior to Admission medications   Medication Sig Start Date End Date Taking? Authorizing Provider  dexamethasone (DECADRON) 4 MG tablet Place 1 tablet (4 mg total) into feeding tube every 6 (six) hours. 01/17/21 02/16/21 Yes Donne Hazel, MD  dronabinol (MARINOL) 5 MG capsule Take 1 capsule (5 mg total) by mouth 2 (two) times daily before a meal. 12/05/20  Yes [name], MD  levETIRAcetam (KEPPRA) 500 MG tablet Take 1 tablet (500 mg total) by mouth 2 (two) times daily. 01/08/21 February 16, 2021 Yes Rehman,  Areeg N, DO  metoprolol succinate (TOPROL-XL) 25 MG 24 hr tablet Take 0.5 tablets (12.5 mg total) by mouth daily. 11/15/20  Yes Agbor-Etang, Aaron Edelman, MD  vitamin B-12 (CYANOCOBALAMIN) 1000 MCG tablet Take 1,000 mcg by mouth daily.   Yes [provider]  Nutritional Supplements (FEEDING SUPPLEMENT, OSMOLITE 1.5 CAL,) LIQD Place 237 mLs into feeding tube every 3 (three) hours. 01/17/21 02/16/21  Donne Hazel, MD    Allergies:  Allergies  Allergen Reactions  . Penicillins Other (See Comments)    "everything felt distant"    ROS:  General: Denies weight loss, weight gain, fatigue, fevers, chills, and night sweats. Eyes: Denies blurry vision, double vision, eye pain, itchy eyes, and tearing. Ears: Denies hearing loss, earache, and ringing in ears. Nose: Denies sinus pain, congestion, infections, runny nose, and nosebleeds. Mouth/throat: Denies hoarseness, sore throat, bleeding gums, and difficulty swallowing. Heart: Denies chest pain, palpitations, racing heart, irregular heartbeat, leg pain or swelling, and decreased activity tolerance. Respiratory: Denies breathing difficulty, shortness of breath, wheezing, cough, and sputum. GI: Denies change in appetite, heartburn, nausea, vomiting, constipation, diarrhea, and blood in stool. GU: Denies difficulty urinating, pain with urinating, urgency, frequency, blood in urine. Musculoskeletal: Denies joint stiffness, pain, swelling, muscle weakness. Skin: Denies rash, itching, mass, tumors, sores, and boils Neurologic: Denies headache, fainting, dizziness, seizures, numbness, and tingling. Psychiatric: Denies depression, anxiety, difficulty sleeping, and memory loss. Endocrine: Denies heat or cold intolerance, and increased thirst or urination. Blood/lymph: Denies easy bruising, easy bruising, and swollen glands     Objective:     BP 106/60   Pulse 87   Temp 97.6 F (36.4 C) (Oral)  Resp (!) 23   Ht _0  (1.905 m)   Wt 72.6 kg    SpO2 97%   BMI 20.00 kg/m   Constitutional :  alert, cooperative, appears stated age and mild distress  Lymphatics/Throat:  no asymmetry, masses, or scars  Respiratory:  clear to auscultation bilaterally  Cardiovascular:  regular rate and rhythm  Gastrointestinal: Soft, no guarding, no distention.  Recent gastrostomy tube insertion site with active clear drainage with specks of brown material.  Copious amount of discharge with each time patient coughs.  Extensive skin excoriation secondary to the leaking gastric contents..  Musculoskeletal: Steady movement  Skin: Cool and moist  Psychiatric: Normal affect, non-agitated, not confused       LABS:  CMP Latest Ref Rng & Units 01/21/2021 01/20/2021 01/17/2021  Glucose 70 - 99 mg/dL 90 169(H) 123(H)  BUN 6 - 20 mg/dL _1 Creatinine 0.61 - 1.24 mg/dL 0.41(L) 0.42(L) <0.30(L)  Sodium 135 - 145 mmol/L 137 133(L) 138  Potassium 3.5 - 5.1 mmol/L 3.5 4.1 4.2  Chloride 98 - 111 mmol/L 97(L) 94(L) 102  CO2 22 - 32 mmol/L _2 Calcium 8.9 - 10.3 mg/dL 8.5(L) 9.2 8.7(L)  Total Protein 6.5 - 8.1 g/dL 5.4(L) - 6.2(L)  Total Bilirubin 0.3 - 1.2 mg/dL 0.9 - 0.7  Alkaline Phos 38 - 126 U/L 215(H) - 218(H)  AST 15 - 41 U/L 23 - 33  ALT 0 - 44 U/L 64(H) - 44   CBC Latest Ref Rng & Units 01/21/2021 01/21/2021 01/21/2021  WBC 4.0 - 10.5 K/uL - 18.6(H) -  Hemoglobin 13.0 - 17.0 g/dL 12.3(L) 11.5(L) 11.9(L)  Hematocrit 39.0 - 52.0 % - 35.5(L) -  Platelets 150 - 400 K/uL - 171 -    RADS: CLINICAL DATA:  Generalized weakness, decreased bowel movements, leaking around feeding tube, history of metastatic small cell lung cancer  EXAM: CT ABDOMEN AND PELVIS WITH CONTRAST  TECHNIQUE: Multidetector CT imaging of the abdomen and pelvis was performed using the standard protocol following bolus administration of intravenous contrast.  CONTRAST:  12m OMNIPAQUE IOHEXOL 300 MG/ML  SOLN  COMPARISON:  11/08/2020  FINDINGS: Lower chest:  Partial visualization of known right lower lobe malignancy, measuring approximately 1.6 x 1.4 cm image 1 of series 2. No airspace disease, effusion, or pneumothorax.  Hepatobiliary: Since the previous exam, marked progression of known intrahepatic metastases. Multiple large confluent lesions are seen throughout the liver, largest in the left lobe measuring 7.8 x 5.7 cm reference image 8 of series 2. This measures approximately 1.3 cm on previous exam.  Multiple gallstones are identified without cholecystitis. No biliary dilation.  Pancreas: Unremarkable. No pancreatic ductal dilatation or surrounding inflammatory changes.  Spleen: Enlarging hypodensity ventral aspect of the spleen measuring 2.7 x 2.2 cm image 23 of series 2 consistent with progressive metastatic disease.  Adrenals/Urinary Tract: Stable left renal cyst. The kidneys enhance normally and symmetrically. No urinary tract calculi or obstruction. Bladder and adrenals are unremarkable.  Stomach/Bowel: No evidence of high-grade bowel obstruction or ileus. Percutaneous gastrostomy tube is seen within the gastric lumen. There is gas dissecting along the catheter tubing within the abdominal wall. No fluid collection.  There is a segment of jejunum with significant wall thickening measuring up to 1.9 cm, image 42 series 2 within the left upper quadrant, suspicious for metastatic disease.  Vascular/Lymphatic: Multiple enlarged lymph nodes are seen within the central mesentery, largest measuring 2.7 by 3.5 cm image 41 of series 2, consistent  with metastatic disease.  Stable atherosclerosis of the abdominal aorta without aneurysm or dissection.  Reproductive: Prostate is unremarkable.  Other: No free fluid or free intraperitoneal gas. No abdominal wall hernia.  Musculoskeletal: Metastatic lesion within the abdominal wall musculature of the right flank measuring 2.4 cm on image 38 of series 2.  There is a  lytic lesion within the left iliac bone abutting the anterior margin of the sacroiliac joint. Large lytic lesion partially visualized within the proximal left femoral diaphysis. These are consistent with metastatic disease. The left femoral lesion is at risk for fracture.  IMPRESSION: 1. Marked progression of metastatic disease. 2. Enlarging masses throughout the liver and spleen. 3. Metastatic adenopathy within the central mesentery and retroperitoneum. 4. Small bowel metastasis involving the jejunum within the left upper quadrant, without high-grade obstruction. 5. Bony metastatic disease as above, with large lytic lesion involving the proximal left femoral diaphysis only partially imaged on this exam. Given the degree of lucency and location, this is at risk for fracture. 6. Partially visualized right lower lobe mass consistent with primary malignancy. 7. Indwelling PEG tube. There is gas along the catheter tubing, without evidence of fluid collection or abscess.   Electronically Signed   By: Randa Ngo M.D.   On: 01/20/2021 20:29  Assessment:     Leaking G-tube, last placed laparoscopically approximately a week ago. Metastatic lung cancer   Plan:     Patient has nonhealing gastrostomy tube site secondary to his metastatic lung cancer.  Gastrostomy tube flushes easily, but noted to be fairly loose when gently placed on traction.  Bolster was tightened down gently to see if the loosened tube cause partial displacement allowing gastric contents to leak around the tube.  This proved unsuccessful and he continued to have very large amounts of gastric contents leaking from around the tube insertion site causing him discomfort.  Due to the copious amounts of constant drainage, and very low likelihood that patient will be able to adequately heal around this area within a reasonable time secondary to his disease, I recommended that we upsized the tube to see if this can  temporarily slow down the discharge by occluding the current wound open, understanding that long-term this can create a bigger wound that can cause issues.  I explained to him that we typically try to revise these but due to his current medical condition, a redo gastrostomy tube insertion will likely result in a similar issue.  Patient verbalized understanding and is agreeable to anything that can slow down the output immediately.  Soft tip guidewire was placed through the tube until resistance was met approximately 6 inches deep to the skin level, and the current tube was removed with no resistance after desufflated of the cuff over the guidewire.  The 57 French tube was then replaced with an 17 French tube by sliding it over the guidewire with ease and no resistance.  Inflatable cuff was infused with water with no resistance, bolster gently placed down onto the skin after placing dry gauze around it.  No leakage was noted after the replacement, and the tube seem to occlude the existing wound well.  Will continue to monitor the area for any leakage for now.  If there is no leakage after observation, recommended obtaining a tube study to ensure the tube is still within the stomach.   If this new tube starts leaking, recommend 2nd opinion with interventional radiology to see if they have any additional recommendations at this point.  Local wound care in the meantime with zinc oxide ointment.

## 2021-01-21 NOTE — ED Notes (Signed)
MD at bedside attempting to replace G tube.

## 2021-01-21 NOTE — ED Notes (Signed)
Zinc ointment applied to abdomen around gtube. Gauze placed over top and paper tape in place. Patient denies further needs at this time. Condom cath remains in place.

## 2021-01-21 NOTE — ED Notes (Signed)
Vancomycin not in pyxis, called pharmacy, Nicki Reaper states will pass message along and tube up shortly.

## 2021-01-21 NOTE — ED Notes (Signed)
Messaged pharmacy to get Desitin sent to ER. Pt aware of ordered medication.

## 2021-01-21 NOTE — ED Notes (Signed)
Gauze around G tube changed again. Brown color noted. Dr. Lysle Pearl informed that G tube is still leaking. Stated he would be back by room to assess.

## 2021-01-21 NOTE — Assessment & Plan Note (Addendum)
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  #Metastatic lung cancer-brain and liver.  Currently on Keytruda.  CT scan-abdomen pelvis on admission shows progressive multiple liver lesions/progressive disease in the pelvis/jejunum/lytic lesion involving proximal femur.  See discussion below  # PEG tube malfunction-leakage around the PEG tube site-question infection.  Biotics.  #Failure to thrive/secondary to progressive malignancy/declining performance status.  #DNR/DNI.  Recommendations:   #Unfortunately patient has progressive disease on immunotherapy.  Given his declining performance status/comorbidities-options are limited.  Patient understands that he is "dying".  For now recommend continued medical care.  However it is reasonable to reassess patient's goals of care when patient's primary oncologist available on 1/24.   # I recommend evaluation with surgery with regards to leaking PEG tube.  Thank you Dr.Kumar for allowing me to participate in the care of your pleasant patient. Please do not hesitate to contact me with questions or concerns in the interim.  Discussed with Dr. Dwyane Dee.  # I reviewed the blood work- with the patient in detail; also reviewed the imaging independently [as summarized above]; and with the patient in detail.

## 2021-01-21 NOTE — ED Notes (Signed)
Gauze pads around Gtube changed at this time with small amount of dark red drainage.

## 2021-01-21 NOTE — Progress Notes (Addendum)
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  Patient was discharged in stable condition and returns to ED this time with progressive generalized weakness, leakage from PEG tube.   Patient is admitted for possible occult infection, leakage from PEG tube.  General surgery, gastroenterology and oncology has been consulted.  Patient is started on antibiotics.   Assessment & Plan:   Active Problems:   Failure to thrive in adult  Leukocytosis : Differentials include possible Occult infection vs steroid induce leukocytosis.  Patient denies any fever, No focus of infection found.  Procalcitonin 0.10, lactic acid 3.1, blood cultures pending. Continue broad spectrum antibiotics and de-escalate base on culture.  Lactic acidosis  possibly related to occult infection but most likely due to malignancy /esp in setting of liver mets  Patient received IV fluids overnight.  Lactic acid improved to 2.7  Lung cancer metastasized to the brain and liver Patient follows up with oncology outside. -on Keytruda -status post whole brain radiation -Last chemotherapy was in October 2021 Hematology consulted, patient has poor prognosis. Recommended palliative care consult to discuss goals of care.  Failure to thrive: -in setting of progressive metastatic disease. will continue with tube feeds once Peg tube replaced. Palliative consult to  discuss goals of care.   PEG tube dysfunction : -of note peg tube in place ,leaking dark material,  CT scan : no obvious dysplacement  IR consulted for tube studies. IR not available on weekends. General surgery consulted.  Constipation : Continue Colace and Senokot.  Seizure d/o: Continue keppra, decadron  DM II: Diet controlled Regular insulin sliding scale.  HTN Continue metoprolol  Systolic heart failure.  -LVEF 30-35% -no acute exacerbation     Social  -patient would like palliative care and hospice consult.   DVT prophylaxis: SCDs Code Status: DNR Family Communication: No family at bedside Disposition Plan:    Status is: Inpatient  Remains inpatient appropriate because:Inpatient level of care appropriate due to severity of illness   Dispo: The patient is from: Home              Anticipated d/c is to: SNF              Anticipated d/c date is: 3 days              Patient currently is not medically stable to d/c.   Difficult to place patient No   Consultants:   Gastroenterology and general surgery  Procedures: Antimicrobials:  Anti-infectives (From admission, onward)   Start     Dose/Rate Route Frequency Ordered Stop   01/21/21 0900  vancomycin (VANCOREADY) IVPB 1250 mg/250 mL        1,250 mg 166.7 mL/hr over 90 Minutes Intravenous Every 12 hours 01/21/21 0346     01/21/21 0400  ceFEPIme (MAXIPIME) 2 g in sodium chloride 0.9 % 100 mL IVPB        2 g 200 mL/hr over 30 Minutes Intravenous Every 8  hours 01/21/21 0023     01/20/21 2045  ceFEPIme (MAXIPIME) 2 g in sodium chloride 0.9 % 100 mL IVPB        2 g 200 mL/hr over 30 Minutes Intravenous  Once 01/20/21 2035 01/20/21 2142   01/20/21 2045  metroNIDAZOLE (FLAGYL) IVPB 500 mg        500 mg 100 mL/hr over 60 Minutes Intravenous  Once 01/20/21 2035 01/20/21 2248   01/20/21 2045  vancomycin (VANCOCIN) IVPB 1000 mg/200 mL premix  Status:  Discontinued        1,000 mg 200 mL/hr over 60  Minutes Intravenous  Once 01/20/21 2035 01/20/21 2040   01/20/21 2045  vancomycin (VANCOREADY) IVPB 1750 mg/350 mL        1,750 mg 175 mL/hr over 120 Minutes Intravenous  Once 01/20/21 2040 01/21/21 0100      Subjective: Patient was seen and examined at bedside.  Patient reports not feeling well.  Patient reported he is dying from his cancer.   Patient has significant soakage around the PEG tube.  Objective: Vitals:   01/21/21 1200 01/21/21 1230 01/21/21 1330 01/21/21 1430  BP: 117/82 116/76 (!) 118/92 128/81  Pulse: 79 88 81 76  Resp: (!) 24 15 20 19   Temp:      TempSrc:      SpO2: 100% 97% 99% 95%  Weight:      Height:        Intake/Output Summary (Last 24 hours) at 01/21/2021 1456 Last data filed at 01/21/2021 1346 Gross per 24 hour  Intake 439.3 ml  Output 1600 ml  Net -1160.7 ml   Filed Weights   01/20/21 1313  Weight: 72.6 kg    Examination:  General exam: Appears calm and comfortable , not in any acute distress. Respiratory system: Clear to auscultation. Respiratory effort normal. Cardiovascular system: S1 & S2 heard, RRR. No JVD, murmurs, rubs, gallops or clicks. No pedal edema. Gastrointestinal system: Abdomen is nondistended, soft and nontender. No organomegaly or masses felt. Normal bowel sounds heard.  PEG tube noted, soakage noted. Central nervous system: Alert and oriented. No focal neurological deficits. Extremities: No edema, no cyanosis, no clubbing. Skin: No rashes, lesions or ulcers Psychiatry: Judgement and insight appear normal. Mood & affect appropriate.     Data Reviewed: I have personally reviewed following labs and imaging studies  CBC: Recent Labs  Lab 01/20/21 1320 01/21/21 0158 01/21/21 0452 01/21/21 0858  WBC 24.3*  --  18.6*  --   HGB 12.9* 11.9* 11.5* 12.3*  HCT 39.5  --  35.5*  --   MCV 91.9  --  91.7  --   PLT 253  --  171  --    Basic Metabolic Panel: Recent Labs  Lab 01/15/21 0649 01/16/21 0453 01/17/21 0450  01/20/21 1320 01/21/21 0452  NA 140 139 138 133* 137  K 3.3* 3.9 4.2 4.1 3.5  CL 101 101 102 94* 97*  CO2 31 30 27 25 28   GLUCOSE 95 113* 123* 169* 90  BUN 8 8 13 15 16   CREATININE 0.45* 0.41* <0.30* 0.42* 0.41*  CALCIUM 8.9 8.6* 8.7* 9.2 8.5*  MG 2.0 1.9 2.1  --   --   PHOS  --  1.7* 2.5  --   --    GFR: Estimated Creatinine Clearance: 103.4 mL/min (A) (by C-G formula based on SCr of 0.41 mg/dL (L)). Liver Function Tests: Recent Labs  Lab 01/15/21 0649 01/16/21 0453 01/17/21 0450 01/21/21 0452  AST 21  21 33 23  ALT 31 35 44 64*  ALKPHOS 154* 172* 218* 215*  BILITOT 0.5 0.6 0.7 0.9  PROT 5.8* 6.1* 6.2* 5.4*  ALBUMIN 2.6* 2.6* 2.7* 2.4*   No results for input(s): LIPASE, AMYLASE in the last 168 hours. No results for input(s): AMMONIA in the last 168 hours. Coagulation Profile: No results for input(s): INR, PROTIME in the last 168 hours. Cardiac Enzymes: No results for input(s): CKTOTAL, CKMB, CKMBINDEX, TROPONINI in the last 168 hours. BNP (last 3 results) No results for input(s): PROBNP in the last 8760 hours. HbA1C: No results for input(s): HGBA1C in the last 72 hours. CBG: Recent Labs  Lab 01/16/21 1206 01/16/21 1700 01/16/21 2125 01/17/21 0841 01/21/21 0900  GLUCAP 116* 111* 159* 220* 78   Lipid Profile: No results for input(s): CHOL, HDL, LDLCALC, TRIG, CHOLHDL, LDLDIRECT in the last 72 hours. Thyroid Function Tests: Recent Labs    01/21/21 0030  TSH 2.395   Anemia Panel: No results for input(s): VITAMINB12, FOLATE, FERRITIN, TIBC, IRON, RETICCTPCT in the last 72 hours. Sepsis Labs: Recent Labs  Lab 01/20/21 1738 01/20/21 2318 01/21/21 0030 01/21/21 0345  PROCALCITON  --   --  0.10  --   LATICACIDVEN 3.2* 3.7* 3.1* 2.7*    Recent Results (from the past 240 hour(s))  Culture, blood (single)     Status: None (Preliminary result)   Collection Time: 01/20/21  9:04 PM   Specimen: BLOOD  Result Value Ref Range Status   Specimen Description  BLOOD BLOOD RIGHT FOREARM  Final   Special Requests   Final    BOTTLES DRAWN AEROBIC AND ANAEROBIC Blood Culture adequate volume   Culture   Final    NO GROWTH < 12 HOURS Performed at [facility], [address], [place], [place] [address]    Report Status PENDING  Incomplete    Radiology Studies: DG Chest 2 View  Result Date: 01/20/2021 CLINICAL DATA:  Weakness with known history of lung carcinoma EXAM: CHEST - 2 VIEW COMPARISON:  01/11/2021 FINDINGS: Cardiac shadow is within normal limits. Right-sided chest wall port is seen. Lungs are hyperinflated consistent with COPD. Previously seen right lower lobe lung mass is not well appreciated on today's exam due to its proximity to the cardiac border medially. IMPRESSION: COPD without acute abnormality. Electronically Signed   By: Inez Catalina M.D.   On: 01/20/2021 16:35   CT ABDOMEN PELVIS W CONTRAST  Result Date: 01/20/2021 CLINICAL DATA:  Generalized weakness, decreased bowel movements, leaking around feeding tube, history of metastatic small cell lung cancer EXAM: CT ABDOMEN AND PELVIS WITH CONTRAST TECHNIQUE: Multidetector CT imaging of the abdomen and pelvis was performed using the standard protocol following bolus administration of intravenous contrast. CONTRAST:  165mL OMNIPAQUE IOHEXOL 300 MG/ML  SOLN COMPARISON:  11/08/2020 FINDINGS: Lower chest: Partial visualization of known right lower lobe malignancy, measuring approximately 1.6 x 1.4 cm image 1 of series 2. No airspace disease, effusion, or pneumothorax. Hepatobiliary: Since the previous exam, marked progression of known intrahepatic metastases. Multiple large confluent lesions are seen throughout the liver, largest in the left lobe measuring 7.8 x 5.7 cm reference image 8 of series 2. This measures approximately 1.3 cm on previous exam. Multiple gallstones are identified without cholecystitis. No biliary dilation. Pancreas: Unremarkable. No pancreatic ductal dilatation or  surrounding inflammatory changes. Spleen: Enlarging hypodensity ventral aspect of the spleen measuring 2.7 x 2.2 cm image 23 of series 2 consistent with progressive metastatic disease. Adrenals/Urinary Tract: Stable left renal  cyst. The kidneys enhance normally and symmetrically. No urinary tract calculi or obstruction. Bladder and adrenals are unremarkable. Stomach/Bowel: No evidence of high-grade bowel obstruction or ileus. Percutaneous gastrostomy tube is seen within the gastric lumen. There is gas dissecting along the catheter tubing within the abdominal wall. No fluid collection. There is a segment of jejunum with significant wall thickening measuring up to 1.9 cm, image 42 series 2 within the left upper quadrant, suspicious for metastatic disease. Vascular/Lymphatic: Multiple enlarged lymph nodes are seen within the central mesentery, largest measuring 2.7 by 3.5 cm image 41 of series 2, consistent with metastatic disease. Stable atherosclerosis of the abdominal aorta without aneurysm or dissection. Reproductive: Prostate is unremarkable. Other: No free fluid or free intraperitoneal gas. No abdominal wall hernia. Musculoskeletal: Metastatic lesion within the abdominal wall musculature of the right flank measuring 2.4 cm on image 38 of series 2. There is a lytic lesion within the left iliac bone abutting the anterior margin of the sacroiliac joint. Large lytic lesion partially visualized within the proximal left femoral diaphysis. These are consistent with metastatic disease. The left femoral lesion is at risk for fracture. IMPRESSION: 1. Marked progression of metastatic disease. 2. Enlarging masses throughout the liver and spleen. 3. Metastatic adenopathy within the central mesentery and retroperitoneum. 4. Small bowel metastasis involving the jejunum within the left upper quadrant, without high-grade obstruction. 5. Bony metastatic disease as above, with large lytic lesion involving the proximal left femoral  diaphysis only partially imaged on this exam. Given the degree of lucency and location, this is at risk for fracture. 6. Partially visualized right lower lobe mass consistent with primary malignancy. 7. Indwelling PEG tube. There is gas along the catheter tubing, without evidence of fluid collection or abscess. Electronically Signed   By: Randa Ngo M.D.   On: 01/20/2021 20:29   Scheduled Meds: . dexamethasone  4 mg Per Tube Q6H  . dronabinol  5 mg Oral BID AC  . feeding supplement (OSMOLITE 1.5 CAL)  237 mL Per Tube Q3H  . levETIRAcetam  500 mg Oral BID  . metoprolol succinate  12.5 mg Oral Daily  . vitamin B-12  1,000 mcg Oral Daily   Continuous Infusions: . ceFEPime (MAXIPIME) IV Stopped (01/21/21 1151)  . lactated ringers 150 mL/hr at 01/20/21 2320  . vancomycin Stopped (01/21/21 1104)     LOS: 0 days    Time spent: 35 mins    Xianna Siverling, MD [facility]   If 7PM-7AM, please contact night-coverage

## 2021-01-21 NOTE — Progress Notes (Signed)
Pharmacy Antibiotic Note  Tyler Gallagher is a 60 y.o. male admitted on 01/20/2021 with sepsis.  Pharmacy has been consulted for Vancomycin and Cefepime dosing.  Plan: Cefepime 2gm IV q8hrs Vancomycin 1250 mg IV Q 12 hrs. Goal AUC 400-550. Expected AUC: 458.8 SCr used: 0.8, Css min predicted 11.2   Height: 6\' 3"  (190.5 cm) Weight: 72.6 kg (160 lb) IBW/kg (Calculated) : 84.5  Temp (24hrs), Avg:97.5 F (36.4 C), Min:97.5 F (36.4 C), Max:97.5 F (36.4 C)  Recent Labs  Lab 01/14/21 0644 01/15/21 0649 01/16/21 0453 01/17/21 0450 01/20/21 1320 01/20/21 1738 01/20/21 2318 01/21/21 0030  WBC  --   --   --   --  24.3*  --   --   --   CREATININE 0.38* 0.45* 0.41* <0.30* 0.42*  --   --   --   LATICACIDVEN  --   --   --   --   --  3.2* 3.7* 3.1*    Estimated Creatinine Clearance: 103.4 mL/min (A) (by C-G formula based on SCr of 0.42 mg/dL (L)).    Allergies  Allergen Reactions  . Penicillins Other (See Comments)    "everything felt distant"    Antimicrobials this admission:   >>    >>   Dose adjustments this admission:   Microbiology results:  BCx:   UCx:    Sputum:    MRSA PCR:   Thank you for allowing pharmacy to be a part of this patient's care.  Hart Robinsons A 01/21/2021 3:47 AM

## 2021-01-22 ENCOUNTER — Inpatient Hospital Stay: Payer: Medicare HMO

## 2021-01-22 LAB — PHOSPHORUS: Phosphorus: 4.1 mg/dL (ref 2.5–4.6)

## 2021-01-22 LAB — CBC
HCT: 31.1 % — ABNORMAL LOW (ref 39.0–52.0)
Hemoglobin: 10.2 g/dL — ABNORMAL LOW (ref 13.0–17.0)
MCH: 30.4 pg (ref 26.0–34.0)
MCHC: 32.8 g/dL (ref 30.0–36.0)
MCV: 92.8 fL (ref 80.0–100.0)
Platelets: 180 10*3/uL (ref 150–400)
RBC: 3.35 MIL/uL — ABNORMAL LOW (ref 4.22–5.81)
RDW: 16.9 % — ABNORMAL HIGH (ref 11.5–15.5)
WBC: 16.4 10*3/uL — ABNORMAL HIGH (ref 4.0–10.5)
nRBC: 0 % (ref 0.0–0.2)

## 2021-01-22 LAB — COMPREHENSIVE METABOLIC PANEL
ALT: 43 U/L (ref 0–44)
AST: 19 U/L (ref 15–41)
Albumin: 2.2 g/dL — ABNORMAL LOW (ref 3.5–5.0)
Alkaline Phosphatase: 169 U/L — ABNORMAL HIGH (ref 38–126)
Anion gap: 11 (ref 5–15)
BUN: 12 mg/dL (ref 6–20)
CO2: 29 mmol/L (ref 22–32)
Calcium: 8.2 mg/dL — ABNORMAL LOW (ref 8.9–10.3)
Chloride: 97 mmol/L — ABNORMAL LOW (ref 98–111)
Creatinine, Ser: 0.36 mg/dL — ABNORMAL LOW (ref 0.61–1.24)
GFR, Estimated: >60 mL/min (ref >60)
Glucose, Bld: 91 mg/dL (ref 70–99)
Potassium: 4 mmol/L (ref 3.5–5.1)
Sodium: 137 mmol/L (ref 135–145)
Total Bilirubin: 1.1 mg/dL (ref 0.3–1.2)
Total Protein: 5.1 g/dL — ABNORMAL LOW (ref 6.5–8.1)

## 2021-01-22 LAB — SARS CORONAVIRUS 2 (TAT 6-24 HRS): SARS Coronavirus 2: NEGATIVE

## 2021-01-22 LAB — MAGNESIUM: Magnesium: 1.9 mg/dL (ref 1.7–2.4)

## 2021-01-22 LAB — PROCALCITONIN: Procalcitonin: 0.14 ng/mL

## 2021-01-22 MED ORDER — DEXAMETHASONE 4 MG PO TABS
4.0000 mg | ORAL_TABLET | Freq: Four times a day (QID) | ORAL | Status: DC
  Administered 2021-01-22 (×2): 4 mg via ORAL
  Filled 2021-01-22 (×4): qty 1

## 2021-01-22 MED ORDER — DEXAMETHASONE 4 MG PO TABS: 4.0000 mg | ORAL_TABLET | Freq: Four times a day (QID) | ORAL | Status: DC

## 2021-01-22 MED ORDER — IOHEXOL 300 MG/ML  SOLN
50.0000 mL | Freq: Once | INTRAMUSCULAR | Status: AC | PRN
  Administered 2021-01-22: 50 mL

## 2021-01-22 MED ORDER — OSMOLITE 1.5 CAL PO LIQD
237.0000 mL | ORAL | Status: DC
  Administered 2021-01-22: 237 mL

## 2021-01-22 NOTE — Progress Notes (Signed)
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  Patient was discharged in stable condition and returns to ED this time with progressive generalized weakness, leakage from PEG tube.   Patient is admitted for possible occult infection, leakage from PEG tube.  General surgery, gastroenterology and oncology has been consulted.  Patient is started on antibiotics.   Assessment & Plan:   Active Problems:   Failure to thrive in adult  Leukocytosis : Differentials include possible Occult infection vs steroid induce leukocytosis.  Patient denies any fever, No focus of infection found.  Procalcitonin 0.10, lactic acid 3.1, blood cultures no growth so far. Continue broad spectrum antibiotics and de-escalate base on culture.  Lactic acidosis : possibly related to occult infection but most likely due to malignancy /esp in setting of liver mets  Patient received IV fluids overnight.  Lactic acid improved to 2.7  Lung cancer metastasized to the brain and liver Patient follows up with oncology clinic outside. -on Keytruda -status post whole brain radiation -Last chemotherapy was in October 2021.  Hematology consulted, patient has poor prognosis. Recommended palliative care consult to discuss goals of care.  Failure to thrive: -in setting of progressive metastatic disease. will continue with tube feeds once Peg tube  replaced. Palliative consult to discuss goals of care.   PEG tube dysfunction : -of note peg tube in place ,leaking brown  material,  CT scan : no obvious dysplacement  IR consulted for tube studies. IR not available on weekends. General surgery consulted. General surgery changed the PEG tube which remains in the stomach,  confirmed on x-ray. Tube feeds resumed but patient continues to leak.  Tube feeds discontinued.   Constipation : Continue Colace and Senokot.  Seizure d/o: Continue keppra, decadron. Seizure precautions.  DM II: Diet controlled: Regular insulin sliding scale.  HTN Continue metoprolol  Systolic heart failure.  -LVEF 30-35% -no acute exacerbation    Social  -patient would like palliative care and hospice consult.   DVT prophylaxis: SCDs Code Status: DNR Family Communication: No family at bedside Disposition Plan:    Status is: Inpatient  Remains inpatient appropriate because:Inpatient level of care appropriate due to severity of illness   Dispo: The patient is from: Home              Anticipated d/c is to: SNF              Anticipated d/c date is: 3 days              Patient currently is not medically stable to d/c.   Difficult to place patient No   Consultants:   Gastroenterology and general surgery  Procedures: Antimicrobials:  Anti-infectives (From admission, onward)   Start     Dose/Rate Route Frequency Ordered Stop   01/21/21 0900  vancomycin (VANCOREADY) IVPB 1250 mg/250 mL        1,250 mg 166.7 mL/hr over 90 Minutes Intravenous Every 12 hours 01/21/21 0346  01/21/21 0400  ceFEPIme (MAXIPIME) 2 g in sodium chloride 0.9 % 100 mL IVPB        2 g 200 mL/hr over 30 Minutes Intravenous Every 8 hours 01/21/21 0023     01/20/21 2045  ceFEPIme (MAXIPIME) 2 g in sodium chloride 0.9 % 100 mL IVPB        2 g 200 mL/hr over 30 Minutes Intravenous  Once 01/20/21 2035 01/20/21 2142   01/20/21 2045  metroNIDAZOLE (FLAGYL) IVPB  500 mg        500 mg 100 mL/hr over 60 Minutes Intravenous  Once 01/20/21 2035 01/20/21 2248   01/20/21 2045  vancomycin (VANCOCIN) IVPB 1000 mg/200 mL premix  Status:  Discontinued        1,000 mg 200 mL/hr over 60 Minutes Intravenous  Once 01/20/21 2035 01/20/21 2040   01/20/21 2045  vancomycin (VANCOREADY) IVPB 1750 mg/350 mL        1,750 mg 175 mL/hr over 120 Minutes Intravenous  Once 01/20/21 2040 01/21/21 0100      Subjective: Patient was seen and examined at bedside.  Patient reports not feeling well.  Patient reports he is dying from his cancer. Patient has significant soakage around the PEG tube.  Left BKA.  Objective: Vitals:   01/22/21 0330 01/22/21 0700 01/22/21 1004 01/22/21 1100  BP: 100/67 118/81 109/82 96/74  Pulse: 70 79 (!) 109 (!) 102  Resp: 14 16 16 14   Temp:      TempSrc:      SpO2: 90% 92% 98% 99%  Weight:      Height:        Intake/Output Summary (Last 24 hours) at 01/22/2021 1159 Last data filed at 01/22/2021 9357 Gross per 24 hour  Intake 889.3 ml  Output 1960 ml  Net -1070.7 ml   Filed Weights   01/20/21 1313  Weight: 72.6 kg    Examination:  General exam: Appears calm and comfortable , not in any acute distress. Respiratory system: Clear to auscultation. Respiratory effort normal. Cardiovascular system: S1 & S2 heard, RRR. No JVD, murmurs, rubs, gallops or clicks. No pedal edema. Gastrointestinal system: Abdomen is nondistended, soft and nontender. No organomegaly or masses felt. Normal bowel sounds heard.  PEG tube noted, soakage noted. Central nervous system: Alert and oriented. No focal neurological deficits. Extremities: Left BKA ,no edema, no cyanosis, no clubbing. Skin: No rashes, lesions or ulcers Psychiatry: Judgement and insight appear normal. Mood & affect appropriate.     Data Reviewed: I have personally reviewed following labs and imaging studies  CBC: Recent Labs  Lab 01/20/21 1320 01/21/21 0158 01/21/21 0452  01/21/21 0858 01/22/21 0530  WBC 24.3*  --  18.6*  --  16.4*  HGB 12.9* 11.9* 11.5* 12.3* 10.2*  HCT 39.5  --  35.5*  --  31.1*  MCV 91.9  --  91.7  --  92.8  PLT 253  --  171  --  017   Basic Metabolic Panel: Recent Labs  Lab 01/16/21 0453 01/17/21 0450 01/20/21 1320 01/21/21 0452 01/22/21 0530  NA 139 138 133* 137 137  K 3.9 4.2 4.1 3.5 4.0  CL 101 102 94* 97* 97*  CO2 30 27 25 28 29   GLUCOSE 113* 123* 169* 90 91  BUN 8 13 15 16 12   CREATININE 0.41* <0.30* 0.42* 0.41* 0.36*  CALCIUM 8.6* 8.7* 9.2 8.5* 8.2*  MG 1.9 2.1  --   --  1.9  PHOS 1.7* 2.5  --   --  4.1   GFR: Estimated Creatinine Clearance: 103.4 mL/min (A) (by C-G formula based on SCr of 0.36 mg/dL (L)). Liver Function Tests: Recent Labs  Lab 01/16/21 0453 01/17/21 0450 01/21/21 0452 01/22/21 0530  AST 21 33 23 19  ALT 35 44 64* 43  ALKPHOS 172* 218* 215* 169*  BILITOT 0.6 0.7 0.9 1.1  PROT 6.1* 6.2* 5.4* 5.1*  ALBUMIN 2.6* 2.7* 2.4* 2.2*   No results for input(s): LIPASE, AMYLASE in the last 168 hours. No results for input(s): AMMONIA in the last 168 hours. Coagulation Profile: No results for input(s): INR, PROTIME in the last 168 hours. Cardiac Enzymes: No results for input(s): CKTOTAL, CKMB, CKMBINDEX, TROPONINI in the last 168 hours. BNP (last 3 results) No results for input(s): PROBNP in the last 8760 hours. HbA1C: No results for input(s): HGBA1C in the last 72 hours. CBG: Recent Labs  Lab 01/16/21 1206 01/16/21 1700 01/16/21 2125 01/17/21 0841 01/21/21 0900  GLUCAP 116* 111* 159* 220* 78   Lipid Profile: No results for input(s): CHOL, HDL, LDLCALC, TRIG, CHOLHDL, LDLDIRECT in the last 72 hours. Thyroid Function Tests: Recent Labs    01/21/21 0030  TSH 2.395   Anemia Panel: No results for input(s): VITAMINB12, FOLATE, FERRITIN, TIBC, IRON, RETICCTPCT in the last 72 hours. Sepsis Labs: Recent Labs  Lab 01/20/21 1738 01/20/21 2318 01/21/21 0030 01/21/21 0345 01/22/21 0530   PROCALCITON  --   --  0.10  --  0.14  LATICACIDVEN 3.2* 3.7* 3.1* 2.7*  --     Recent Results (from the past 240 hour(s))  Culture, blood (single)     Status: None (Preliminary result)   Collection Time: 01/20/21  9:04 PM   Specimen: BLOOD  Result Value Ref Range Status   Specimen Description BLOOD BLOOD RIGHT FOREARM  Final   Special Requests   Final    BOTTLES DRAWN AEROBIC AND ANAEROBIC Blood Culture adequate volume   Culture   Final    NO GROWTH 2 DAYS Performed at [facility], [address], [place], [place] [address]    Report Status PENDING  Incomplete  SARS CORONAVIRUS 2 (TAT 6-24 HRS) Nasopharyngeal Nasopharyngeal Swab     Status: None   Collection Time: 01/21/21 11:00 PM   Specimen: Nasopharyngeal Swab  Result Value Ref Range Status   SARS Coronavirus 2 NEGATIVE NEGATIVE Final    Comment: (NOTE) SARS-CoV-2 target nucleic acids are NOT DETECTED.  The SARS-CoV-2 RNA is generally detectable in upper and lower respiratory specimens during the acute phase of infection. Negative results do not preclude SARS-CoV-2 infection, do not rule out co-infections with other pathogens, and should not be used as the sole basis for treatment or other patient management decisions. Negative results must be combined with clinical observations, patient history, and epidemiological information. The expected result is Negative.  Fact Sheet for Patients: SugarRoll.be  Fact Sheet for Healthcare Providers: https://www.woods-mathews.com/  This test is not yet approved or cleared by the [place] FDA and  has been authorized for detection and/or diagnosis of SARS-CoV-2 by FDA under an Emergency Use Authorization (EUA). This EUA will remain  in effect (meaning this test can be used) for the duration of the COVID-19 declaration under Se ction 564(b)(1) of the Act, 21 U.S.C. section 360bbb-3(b)(1), unless the authorization is terminated  or revoked sooner.  Performed at [facility], [place] [address] [place], [place] [address]     Radiology Studies: DG Chest 2 View  Result Date: 01/20/2021 CLINICAL DATA:  Weakness with known history of lung carcinoma EXAM: CHEST - 2 VIEW COMPARISON:  01/11/2021 FINDINGS: Cardiac shadow is within normal limits. Right-sided chest wall port is seen. Lungs are hyperinflated consistent with COPD. Previously seen right lower lobe lung mass is not well appreciated on today's exam due to its proximity to the cardiac border medially. IMPRESSION: COPD without acute abnormality. Electronically Signed   By: Inez Catalina M.D.   On: 01/20/2021 16:35   DG Abd 1 View  Result Date: 01/22/2021 CLINICAL DATA:  G-tube placement. EXAM: ABDOMEN - 1 VIEW COMPARISON:  CT abdomen pelvis 01/20/2021 FINDINGS: Contrast material inserted through the G-tube. The oral contrast material appears to be located within the stomach without evidence for extravasation. Left lung bases clear. Gaseous distended loops of small bowel left hemiabdomen. IMPRESSION: G-tube appears to be located within the stomach. Electronically Signed   By: Lovey Newcomer M.D.   On: 01/22/2021 07:24   CT ABDOMEN PELVIS W CONTRAST  Result Date: 01/20/2021 CLINICAL DATA:  Generalized weakness, decreased bowel movements, leaking around feeding tube, history of metastatic small cell lung cancer EXAM: CT ABDOMEN AND PELVIS WITH CONTRAST TECHNIQUE: Multidetector CT imaging of the abdomen and pelvis was performed using the standard protocol following bolus administration of intravenous contrast. CONTRAST:  153mL OMNIPAQUE IOHEXOL 300 MG/ML  SOLN COMPARISON:  11/08/2020 FINDINGS: Lower chest: Partial visualization of known right lower lobe malignancy, measuring approximately 1.6 x 1.4 cm image 1 of series 2. No airspace disease, effusion, or pneumothorax. Hepatobiliary: Since the previous exam, marked progression of known intrahepatic metastases. Multiple large  confluent lesions are seen throughout the liver, largest in the left lobe measuring 7.8 x 5.7 cm reference image 8 of series 2. This measures approximately 1.3 cm on previous exam. Multiple gallstones are identified without cholecystitis. No biliary dilation. Pancreas: Unremarkable. No pancreatic ductal dilatation or surrounding inflammatory changes. Spleen: Enlarging hypodensity ventral aspect of the spleen measuring 2.7 x 2.2 cm image 23 of series 2 consistent with progressive metastatic disease. Adrenals/Urinary Tract: Stable left renal cyst. The kidneys enhance normally and symmetrically. No urinary tract calculi or obstruction. Bladder and adrenals are unremarkable. Stomach/Bowel: No evidence of high-grade bowel obstruction or ileus. Percutaneous gastrostomy tube is seen within the gastric lumen. There is gas dissecting along the catheter tubing within the abdominal wall. No fluid collection. There is a segment of jejunum with significant wall thickening measuring up to 1.9 cm, image 42 series 2 within the left upper quadrant, suspicious for metastatic disease. Vascular/Lymphatic: Multiple enlarged lymph nodes are seen within the central mesentery, largest measuring 2.7 by 3.5 cm image 41 of series 2, consistent with metastatic disease. Stable atherosclerosis of the abdominal aorta without aneurysm or dissection. Reproductive: Prostate is unremarkable. Other: No free fluid or free intraperitoneal gas. No abdominal wall hernia. Musculoskeletal: Metastatic lesion within the abdominal wall musculature of the right flank measuring 2.4 cm on image 38 of series 2. There is a lytic lesion within the left iliac bone abutting the anterior margin of the sacroiliac joint. Large lytic lesion partially visualized within the proximal left femoral diaphysis. These are consistent with metastatic disease. The left femoral lesion is at risk for fracture. IMPRESSION: 1. Marked progression of metastatic disease. 2. Enlarging  masses throughout the liver and spleen. 3. Metastatic adenopathy within the central mesentery and retroperitoneum. 4. Small bowel metastasis involving the jejunum within the left upper quadrant, without high-grade obstruction. 5. Bony metastatic disease as above, with large lytic lesion involving the proximal left femoral  diaphysis only partially imaged on this exam. Given the degree of lucency and location, this is at risk for fracture. 6. Partially visualized right lower lobe mass consistent with primary malignancy. 7. Indwelling PEG tube. There is gas along the catheter tubing, without evidence of fluid collection or abscess. Electronically Signed   By: Randa Ngo M.D.   On: 01/20/2021 20:29   Scheduled Meds: . dexamethasone  4 mg Per Tube Q6H  . dronabinol  5 mg Oral BID AC  . levETIRAcetam  500 mg Oral BID  . liver oil-zinc oxide   Topical Daily  . metoprolol succinate  12.5 mg Oral Daily  . vitamin B-12  1,000 mcg Oral Daily   Continuous Infusions: . ceFEPime (MAXIPIME) IV 2 g (01/22/21 1144)  . vancomycin Stopped (01/22/21 1134)     LOS: 1 day    Time spent: 25 mins    Andersen Mckiver, MD [facility]   If 7PM-7AM, please contact night-coverage

## 2021-01-22 NOTE — ED Notes (Signed)
G tube leaking. MD notified. Orders obtained for NPO except PO meds. Tube feeds d/c at this time.

## 2021-01-23 ENCOUNTER — Encounter: Payer: Self-pay | Admitting: *Deleted

## 2021-01-23 ENCOUNTER — Encounter: Payer: Self-pay | Admitting: Internal Medicine

## 2021-01-23 DIAGNOSIS — K9420 Gastrostomy complication, unspecified: Secondary | ICD-10-CM

## 2021-01-23 DIAGNOSIS — C787 Secondary malignant neoplasm of liver and intrahepatic bile duct: Secondary | ICD-10-CM | POA: Diagnosis not present

## 2021-01-23 DIAGNOSIS — Z515 Encounter for palliative care: Secondary | ICD-10-CM

## 2021-01-23 DIAGNOSIS — R627 Adult failure to thrive: Secondary | ICD-10-CM | POA: Diagnosis not present

## 2021-01-23 DIAGNOSIS — C3431 Malignant neoplasm of lower lobe, right bronchus or lung: Secondary | ICD-10-CM | POA: Diagnosis not present

## 2021-01-23 DIAGNOSIS — C7931 Secondary malignant neoplasm of brain: Secondary | ICD-10-CM | POA: Diagnosis not present

## 2021-01-23 LAB — MAGNESIUM: Magnesium: 2.2 mg/dL (ref 1.7–2.4)

## 2021-01-23 LAB — CBC
HCT: 31.8 % — ABNORMAL LOW (ref 39.0–52.0)
Hemoglobin: 10.5 g/dL — ABNORMAL LOW (ref 13.0–17.0)
MCH: 30.1 pg (ref 26.0–34.0)
MCHC: 33 g/dL (ref 30.0–36.0)
MCV: 91.1 fL (ref 80.0–100.0)
Platelets: 179 10*3/uL (ref 150–400)
RBC: 3.49 MIL/uL — ABNORMAL LOW (ref 4.22–5.81)
RDW: 17 % — ABNORMAL HIGH (ref 11.5–15.5)
WBC: 14.5 10*3/uL — ABNORMAL HIGH (ref 4.0–10.5)
nRBC: 0 % (ref 0.0–0.2)

## 2021-01-23 LAB — BASIC METABOLIC PANEL
Anion gap: 9 (ref 5–15)
BUN: 17 mg/dL (ref 6–20)
CO2: 28 mmol/L (ref 22–32)
Calcium: 8.1 mg/dL — ABNORMAL LOW (ref 8.9–10.3)
Chloride: 98 mmol/L (ref 98–111)
Creatinine, Ser: 0.36 mg/dL — ABNORMAL LOW (ref 0.61–1.24)
GFR, Estimated: >60 mL/min (ref >60)
Glucose, Bld: 129 mg/dL — ABNORMAL HIGH (ref 70–99)
Potassium: 3.9 mmol/L (ref 3.5–5.1)
Sodium: 135 mmol/L (ref 135–145)

## 2021-01-23 LAB — PHOSPHORUS: Phosphorus: 3.1 mg/dL (ref 2.5–4.6)

## 2021-01-23 LAB — LACTIC ACID, PLASMA: Lactic Acid, Venous: 1.4 mmol/L (ref 0.5–1.9)

## 2021-01-23 LAB — PROCALCITONIN: Procalcitonin: 0.13 ng/mL

## 2021-01-23 LAB — VANCOMYCIN, PEAK: Vancomycin Pk: 48 ug/mL — ABNORMAL HIGH (ref 30–40)

## 2021-01-23 LAB — VANCOMYCIN, TROUGH: Vancomycin Tr: 12 ug/mL — ABNORMAL LOW (ref 15–20)

## 2021-01-23 MED ORDER — HYDROMORPHONE HCL 1 MG/ML IJ SOLN
1.0000 mg | INTRAMUSCULAR | Status: DC | PRN
  Administered 2021-01-23 – 2021-01-24 (×3): 1 mg via INTRAVENOUS
  Filled 2021-01-23 (×3): qty 1

## 2021-01-23 MED ORDER — MORPHINE SULFATE (PF) 4 MG/ML IV SOLN
4.0000 mg | INTRAVENOUS | Status: DC | PRN
  Administered 2021-01-23: 4 mg via INTRAVENOUS
  Filled 2021-01-23 (×2): qty 1

## 2021-01-23 MED ORDER — CHLORHEXIDINE GLUCONATE CLOTH 2 % EX PADS
6.0000 | MEDICATED_PAD | Freq: Every day | CUTANEOUS | Status: DC
  Administered 2021-01-23 – 2021-01-24 (×2): 6 via TOPICAL

## 2021-01-23 MED ORDER — ACETAMINOPHEN 650 MG RE SUPP: 650.0000 mg | Freq: Four times a day (QID) | RECTAL | Status: DC | PRN

## 2021-01-23 MED ORDER — VITAMIN B-12 1000 MCG PO TABS: 1000.0000 ug | ORAL_TABLET | Freq: Every day | ORAL | Status: DC

## 2021-01-23 MED ORDER — ACETAMINOPHEN 325 MG PO TABS: 650.0000 mg | ORAL_TABLET | Freq: Four times a day (QID) | ORAL | Status: DC | PRN

## 2021-01-23 MED ORDER — ONDANSETRON HCL 4 MG PO TABS: 4.0000 mg | ORAL_TABLET | Freq: Four times a day (QID) | ORAL | Status: DC | PRN

## 2021-01-23 MED ORDER — LORAZEPAM 2 MG/ML IJ SOLN
2.0000 mg | Freq: Once | INTRAMUSCULAR | Status: AC
  Administered 2021-01-23: 2 mg via INTRAVENOUS
  Filled 2021-01-23: qty 1

## 2021-01-23 MED ORDER — MORPHINE SULFATE (PF) 4 MG/ML IV SOLN
4.0000 mg | Freq: Once | INTRAVENOUS | Status: AC
  Administered 2021-01-23: 4 mg via INTRAVENOUS

## 2021-01-23 MED ORDER — ONDANSETRON HCL 4 MG/2ML IJ SOLN: 4.0000 mg | Freq: Four times a day (QID) | INTRAMUSCULAR | Status: DC | PRN

## 2021-01-23 MED ORDER — VANCOMYCIN HCL IN DEXTROSE 1-5 GM/200ML-% IV SOLN
1000.0000 mg | Freq: Two times a day (BID) | INTRAVENOUS | Status: DC
  Administered 2021-01-23 – 2021-01-24 (×2): 1000 mg via INTRAVENOUS
  Filled 2021-01-23 (×3): qty 200

## 2021-01-23 MED ORDER — DEXAMETHASONE SODIUM PHOSPHATE 4 MG/ML IJ SOLN
4.0000 mg | Freq: Four times a day (QID) | INTRAMUSCULAR | Status: DC
  Administered 2021-01-23 – 2021-01-24 (×5): 4 mg via INTRAVENOUS
  Filled 2021-01-23 (×10): qty 1

## 2021-01-23 MED ORDER — SODIUM CHLORIDE 0.9 % IV SOLN
INTRAVENOUS | Status: DC | PRN
  Administered 2021-01-23: 250 mL via INTRAVENOUS

## 2021-01-23 MED ORDER — LEVETIRACETAM 100 MG/ML PO SOLN
500.0000 mg | Freq: Two times a day (BID) | ORAL | Status: DC
  Administered 2021-01-23: 500 mg
  Filled 2021-01-23 (×4): qty 5

## 2021-01-23 MED ORDER — POLYETHYLENE GLYCOL 3350 17 G PO PACK: 17.0000 g | PACK | Freq: Every day | ORAL | Status: DC | PRN

## 2021-01-23 NOTE — Progress Notes (Signed)
Seen and examined. G tube upsized due to leak. Abd soft, no peritonitis. He is terminal. I placed 5 cc extra of water and place tabe on top of bolster.  D/ W he wishes to have the tube remove regardless, I did warn him that this will increase the leak. He is up to the point of going to hospice and just wants to be comfortable . I can remove it before he is DC if he still wants this. RN instructed to place binder

## 2021-01-23 NOTE — Progress Notes (Signed)
60 y.o. male inpatient. History of DM, HTN. HLD, metastatic lung cancer. Admitted for generalized weakness and leaking around the gastrostomy tube exit site. gastrostomy tube placed by surgery on 1.14.22. Surgery exchanged  and upsized the G tube to an 18 Fr  at bedside on 1.22.22. Per note from  Dr. Ermalinda Memos dated 1.22.22  The exit site is non healing secondary to cancer. Regarding up sizing the existing gastrostomy tube rhe note states understanding that long-term this can create a bigger wound that can cause issues.  I explained to him that we typically try to revise these but due to his current medical condition, a redo gastrostomy tube insertion will likely result in a similar issue. Case discussed with IR Attending Dr. Annamaria Boots. After review of chart  IR agrees with Surgery's hesitation for serial up sizing of  the existing gastrostomy tube. Recommend Wound Management be consulted for evaluation and possible interventions to encourage wound healing. This was communicated directly to the Team.

## 2021-01-23 NOTE — Progress Notes (Signed)
Initial Nutrition Assessment  DOCUMENTATION CODES:   Severe malnutrition in context of chronic illness  INTERVENTION:  No plans for initiation of tube feeds at this time. Tube feeds on hold as gastric contents are leaking around G-tube. Patient also reports he would not want to resume tube feeds at this time.  Patient currently declining oral nutrition supplements (offered clear ONS as patient on clear diet) as he reports all PO intake leaks around G-tube.  Will monitor for outcome of discussions regarding goals of care and for plan of care.  NUTRITION DIAGNOSIS:   Severe Malnutrition related to chronic illness (stage IV lung cancer with brain mets, CHF) as evidenced by severe fat depletion,severe muscle depletion.  GOAL:   Patient will meet greater than or equal to 90% of their needs  MONITOR:   PO intake,Diet advancement,Labs,Weight trends,Skin,I & O's  REASON FOR ASSESSMENT:   Malnutrition Screening Tool    ASSESSMENT:   60 year old male with PMHx of HTN, DM, HLD, CHF, depression, seizures, hx left AKA, stage IV lung cancer s/p chemotherapy, resection of brain mass 06/2020, whole brain radiation most recently on Keytruda, recent admission to [facility] 1/7-1/10 with seizure-like activity related to new brain metastases now admitted with seizures, recent admission to [facility] 1/12-1/18 with seizures and also s/p G-tube placement on 1/14.   Met with patient at bedside. He reports when he discharged home from the hospital he began having issues with G-tube leaking gastric contents. He is s/p replacement of 16 Fr. Tube with larger 18 Fr. Tube on 1/22 by Surgeon but reports tube is still leaking, especially after he tries to eat anything by mouth. Patient is pending tube study by IR. Plan is to hold tube feeds at this time but either way patient does not want any more tube feeds at this time. He also does not want to take any clear liquids at this time as it leaks around tube. He declined  clear ONS at this time. Site around G-tube with severe excoriation and erythema and patient states it is very painful.  Patient reports his UBW was 280 lbs and he has lost a significant amount of weight over time. He is now 155-160 lbs per his report. Per review of chart patient was documented to be 260 lbs on 06/21/2020. He has lost approximately 100 lbs (38.5% body weight) over the past 7 months, which is significant for time frame. Current documented weight is 77.4 kg (170.64 lbs), but unsure if this is accurate as patient was 68.6 kg on 01/17/2021.  Medications reviewed and include: Decadron 4 mg Q6hrs IV, Marinol 5 mg BID, Keppra, vitamin B12 1000 micrograms daily, cefepime, vancomycin.  Labs reviewed: Creatinine 0.36.  Discussed with RN and Palliative Care NP. Patient is interested in hospice care. Plan is for wound care consult.  NUTRITION - FOCUSED PHYSICAL EXAM:  Flowsheet Row Most Recent Value  Orbital Region Severe depletion  Upper Arm Region Severe depletion  Thoracic and Lumbar Region Moderate depletion  Buccal Region Severe depletion  Temple Region Moderate depletion  Clavicle Bone Region Severe depletion  Clavicle and Acromion Bone Region Severe depletion  Scapular Bone Region Moderate depletion  Dorsal Hand Severe depletion  Patellar Region Severe depletion  [assessed right lower extremity,  pt s/p left AKA]  Anterior Thigh Region Severe depletion  [assessed right lower extremity,  pt s/p left AKA]  Posterior Calf Region Severe depletion  [assessed right lower extremity,  pt s/p left AKA]  Edema (RD Assessment) None  Hair Reviewed  Eyes Reviewed  Mouth Reviewed  Skin Reviewed  Nails Reviewed     Diet Order:   Diet Order            Diet clear liquid Room service appropriate? Yes; Fluid consistency: Thin  Diet effective now                EDUCATION NEEDS:   No education needs have been identified at this time  Skin:  Skin Assessment: Reviewed RN  Assessment  Last BM:  Unknown  Height:   Ht Readings from Last 1 Encounters:  01/23/21 '6\' 4"'  (1.93 m)   Weight:   Wt Readings from Last 1 Encounters:  01/23/21 77.4 kg   BMI:  Body mass index is 20.77 kg/m.  Estimated Nutritional Needs:   Kcal:  0354-6568  Protein:  120-140 grams  Fluid:  >/= 2 L/day  Jacklynn Barnacle, MS, RD, LDN Pager number available on Amion

## 2021-01-23 NOTE — Progress Notes (Signed)
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 Patient was discharged in stable condition and returns to ED this time with progressive generalized weakness, leakage from PEG tube.   Patient is admitted for possible occult infection, leakage from PEG tube.  General surgery, gastroenterology and oncology has been consulted.  Patient is started on antibiotics.  Palliative care consulted to discuss goals of care.   Assessment & Plan:   Active Problems:   Failure to thrive in adult  Leukocytosis : Differentials include possible Occult infection vs steroid induce leukocytosis.  Patient denies any fever, No focus of infection found.  Procalcitonin 0.10, lactic acid 3.1, blood cultures no growth so far. Continue broad spectrum antibiotics and de-escalate base on culture.    Lactic acidosis : Resolved possibly related to occult infection but most likely due to malignancy /esp in setting of liver mets  Patient received IV fluids overnight.  Lactic acid improved to 1.7  Lung cancer metastasized to the brain and liver Patient follows up with oncology clinic outside. -on Keytruda -status post whole brain radiation -Last chemotherapy was in October 2021.  Hematology consulted, patient has poor prognosis. Recommended palliative care consult to discuss goals of care. Patient is agreeable to home hospice.  Failure to thrive: -in  setting of progressive metastatic disease. will continue with tube feeds once Peg tube replaced. Palliative consult to discuss goals of care. Patient is agreeable to home hospice   PEG tube dysfunction : -of note peg tube in place ,leaking brown  material,  CT scan : no obvious dysplacement  IR consulted for tube studies. IR not available on weekends. General surgery consulted. General surgery changed the PEG tube which remains in the stomach,  confirmed on x-ray. Tube feeds resumed but patient continues to leak.  Tube feeds discontinued.   Constipation : Continue Colace and Senokot.  Seizure d/o: Continue keppra, decadron. Seizure precautions.  DM II: Diet controlled: Regular insulin sliding scale.  HTN Continue metoprolol  Systolic heart failure.  -LVEF 30-35% -no acute exacerbation    Social  -patient would like palliative care and hospice consult.   DVT prophylaxis: SCDs Code Status: DNR Family Communication: No family at bedside Disposition Plan:    Status is: Inpatient  Remains inpatient appropriate because:Inpatient level of care appropriate due to severity of illness   Dispo: The patient is from: Home              Anticipated d/c is to: SNF              Anticipated d/c date is: 3 days              Patient currently is not medically stable to d/c.   Difficult to place patient No   Consultants:   Gastroenterology and general surgery  Procedures: Antimicrobials:  Anti-infectives (From admission, onward)   Start     Dose/Rate Route Frequency Ordered Stop   01/21/21 0900  vancomycin (VANCOREADY) IVPB 1250  mg/250 mL        1,250 mg 166.7 mL/hr over 90 Minutes Intravenous Every 12 hours 01/21/21 0346     01/21/21 0400  ceFEPIme (MAXIPIME) 2 g in sodium chloride 0.9 % 100 mL IVPB        2 g 200 mL/hr over 30 Minutes Intravenous Every 8 hours 01/21/21 0023     01/20/21 2045  ceFEPIme (MAXIPIME) 2 g in sodium chloride 0.9 % 100 mL IVPB         2 g 200 mL/hr over 30 Minutes Intravenous  Once 01/20/21 2035 01/20/21 2142   01/20/21 2045  metroNIDAZOLE (FLAGYL) IVPB 500 mg        500 mg 100 mL/hr over 60 Minutes Intravenous  Once 01/20/21 2035 01/20/21 2248   01/20/21 2045  vancomycin (VANCOCIN) IVPB 1000 mg/200 mL premix  Status:  Discontinued        1,000 mg 200 mL/hr over 60 Minutes Intravenous  Once 01/20/21 2035 01/20/21 2040   01/20/21 2045  vancomycin (VANCOREADY) IVPB 1750 mg/350 mL        1,750 mg 175 mL/hr over 120 Minutes Intravenous  Once 01/20/21 2040 01/21/21 0100      Subjective: Patient was seen and examined at bedside.  Patient reports not feeling well.  Patient reports he is dying from his cancer. Patient still has slight leakage from the PEG tube area with some excoriation.  Left BKA.  Objective: Vitals:   01/23/21 0408 01/23/21 0439 01/23/21 0837 01/23/21 1113  BP:  101/72 106/79 92/71  Pulse:  66 72 88  Resp:  16 16 18   Temp:  97.7 F (36.5 C) 97.7 F (36.5 C) 98.6 F (37 C)  TempSrc:  Oral Oral   SpO2:  96% 94% 97%  Weight: 77.4 kg     Height: 6\' 4"  (1.93 m)       Intake/Output Summary (Last 24 hours) at 01/23/2021 1352 Last data filed at 01/23/2021 1110 Gross per 24 hour  Intake 195.67 ml  Output 950 ml  Net -754.33 ml   Filed Weights   01/20/21 1313 01/23/21 0046 01/23/21 0408  Weight: 72.6 kg 76.9 kg 77.4 kg    Examination:  General exam: Appears calm and comfortable , not in any acute distress. Respiratory system: Clear to auscultation. Respiratory effort normal. Cardiovascular system: S1 & S2 heard, RRR. No JVD, murmurs, rubs, gallops or clicks. No pedal edema. Gastrointestinal system: Abdomen is nondistended, soft and nontender. No organomegaly or masses felt. Normal bowel sounds heard.  PEG tube noted, soakage noted. Central nervous system: Alert and oriented. No focal neurological deficits. Extremities: Left BKA ,no edema, no cyanosis, no clubbing. Skin: No rashes, lesions or  ulcers Psychiatry: Judgement and insight appear normal. Mood & affect appropriate.     Data Reviewed: I have personally reviewed following labs and imaging studies  CBC: Recent Labs  Lab 01/20/21 1320 01/21/21 0158 01/21/21 0452 01/21/21 0858 01/22/21 0530 01/23/21 0402  WBC 24.3*  --  18.6*  --  16.4* 14.5*  HGB 12.9* 11.9* 11.5* 12.3* 10.2* 10.5*  HCT 39.5  --  35.5*  --  31.1* 31.8*  MCV 91.9  --  91.7  --  92.8 91.1  PLT 253  --  171  --  180 081   Basic Metabolic Panel: Recent Labs  Lab 01/17/21 0450 01/20/21 1320 01/21/21 0452 01/22/21 0530 01/23/21 0402  NA 138 133* 137 137 135  K 4.2 4.1 3.5 4.0 3.9  CL 102 94*  97* 97* 98  CO2 27 25 28 29 28   GLUCOSE 123* 169* 90 91 129*  BUN 13 15 16 12 17   CREATININE <0.30* 0.42* 0.41* 0.36* 0.36*  CALCIUM 8.7* 9.2 8.5* 8.2* 8.1*  MG 2.1  --   --  1.9 2.2  PHOS 2.5  --   --  4.1 3.1   GFR: Estimated Creatinine Clearance: 110.2 mL/min (A) (by C-G formula based on SCr of 0.36 mg/dL (L)). Liver Function Tests: Recent Labs  Lab 01/17/21 0450 01/21/21 0452 01/22/21 0530  AST 33 23 19  ALT 44 64* 43  ALKPHOS 218* 215* 169*  BILITOT 0.7 0.9 1.1  PROT 6.2* 5.4* 5.1*  ALBUMIN 2.7* 2.4* 2.2*   No results for input(s): LIPASE, AMYLASE in the last 168 hours. No results for input(s): AMMONIA in the last 168 hours. Coagulation Profile: No results for input(s): INR, PROTIME in the last 168 hours. Cardiac Enzymes: No results for input(s): CKTOTAL, CKMB, CKMBINDEX, TROPONINI in the last 168 hours. BNP (last 3 results) No results for input(s): PROBNP in the last 8760 hours. HbA1C: No results for input(s): HGBA1C in the last 72 hours. CBG: Recent Labs  Lab 01/16/21 1700 01/16/21 2125 01/17/21 0841 01/21/21 0900  GLUCAP 111* 159* 220* 78   Lipid Profile: No results for input(s): CHOL, HDL, LDLCALC, TRIG, CHOLHDL, LDLDIRECT in the last 72 hours. Thyroid Function Tests: Recent Labs    01/21/21 0030  TSH 2.395    Anemia Panel: No results for input(s): VITAMINB12, FOLATE, FERRITIN, TIBC, IRON, RETICCTPCT in the last 72 hours. Sepsis Labs: Recent Labs  Lab 01/20/21 2318 01/21/21 0030 01/21/21 0345 01/22/21 0530 01/23/21 0402  PROCALCITON  --  0.10  --  0.14 0.13  LATICACIDVEN 3.7* 3.1* 2.7*  --  1.4    Recent Results (from the past 240 hour(s))  Culture, blood (single)     Status: None (Preliminary result)   Collection Time: 01/20/21  9:04 PM   Specimen: BLOOD  Result Value Ref Range Status   Specimen Description BLOOD BLOOD RIGHT FOREARM  Final   Special Requests   Final    BOTTLES DRAWN AEROBIC AND ANAEROBIC Blood Culture adequate volume   Culture   Final    NO GROWTH 3 DAYS Performed at [facility], [address] [place], [place] [address]    Report Status PENDING  Incomplete  SARS CORONAVIRUS 2 (TAT 6-24 HRS) Nasopharyngeal Nasopharyngeal Swab     Status: None   Collection Time: 01/21/21 11:00 PM   Specimen: Nasopharyngeal Swab  Result Value Ref Range Status   SARS Coronavirus 2 NEGATIVE NEGATIVE Final    Comment: (NOTE) SARS-CoV-2 target nucleic acids are NOT DETECTED.  The SARS-CoV-2 RNA is generally detectable in upper and lower respiratory specimens during the acute phase of infection. Negative results do not preclude SARS-CoV-2 infection, do not rule out co-infections with other pathogens, and should not be used as the sole basis for treatment or other patient management decisions. Negative results must be combined with clinical observations, patient history, and epidemiological information. The expected result is Negative.  Fact Sheet for Patients: SugarRoll.be  Fact Sheet for Healthcare Providers: https://www.woods-mathews.com/  This test is not yet approved or cleared by the [place] FDA and  has been authorized for detection and/or diagnosis of SARS-CoV-2 by FDA under an Emergency Use Authorization  (EUA). This EUA will remain  in effect (meaning this test can be used) for the duration of the COVID-19 declaration under Se ction 564(b)(1)  of the Act, 21 U.S.C. section 360bbb-3(b)(1), unless the authorization is terminated or revoked sooner.  Performed at [facility], [place] [address] [place], [place] [address]     Radiology Studies: DG Abd 1 View  Result Date: 01/22/2021 CLINICAL DATA:  G-tube placement. EXAM: ABDOMEN - 1 VIEW COMPARISON:  CT abdomen pelvis 01/20/2021 FINDINGS: Contrast material inserted through the G-tube. The oral contrast material appears to be located within the stomach without evidence for extravasation. Left lung bases clear. Gaseous distended loops of small bowel left hemiabdomen. IMPRESSION: G-tube appears to be located within the stomach. Electronically Signed   By: Lovey Newcomer M.D.   On: 01/22/2021 07:24   Scheduled Meds: . Chlorhexidine Gluconate Cloth  6 each Topical Daily  . dexamethasone (DECADRON) injection  4 mg Intravenous Q6H  . dronabinol  5 mg Oral BID AC  . levETIRAcetam  500 mg Per Tube BID  . liver oil-zinc oxide   Topical Daily  . metoprolol succinate  12.5 mg Oral Daily  . [START ON 01/24/2021] vitamin B-12  1,000 mcg Per Tube Daily   Continuous Infusions: . sodium chloride Stopped (01/23/21 0437)  . ceFEPime (MAXIPIME) IV 2 g (01/23/21 1204)  . vancomycin 1,250 mg (01/23/21 0850)     LOS: 2 days    Time spent: 25 mins    Haylin Camilli, MD [facility]   If 7PM-7AM, please contact night-coverage

## 2021-01-23 NOTE — Progress Notes (Signed)
Pharmacy Antibiotic Note  Tyler Gallagher is a 60 y.o. male admitted on 01/20/2021 with sepsis.  Pharmacy has been consulted for Vancomycin and Cefepime dosing.  Plan: Cefepime 2gm IV q8hrs Vancomycin 1250 mg IV Q 12 hrs. Goal AUC 400-550. Expected AUC: 458.8 SCr used: 0.8, Css min predicted 11.2  1/24:  Vancomycin 1250 mg IV Q12H resulted in AUC = 632.7.  Will decrease dose to Vancomycin 1 gm IV Q12H to start on 1/24 @ 2300.  Expected AUC = 505.6 Will recheck peak and trough around 3rd new dose on 1/25 @ 2300.  Vanc peak 1/26 @ 0100 Vanc trough 1/26 @ 1030   Height: 6\' 4"  (193 cm) Weight: 77.4 kg (170 lb 10.2 oz) IBW/kg (Calculated) : 86.8  Temp (24hrs), Avg:97.8 F (36.6 C), Min:97.5 F (36.4 C), Max:98.6 F (37 C)  Recent Labs  Lab 01/17/21 0450 01/20/21 1320 01/20/21 1738 01/20/21 2318 01/21/21 0030 01/21/21 0345 01/21/21 0452 01/22/21 0530 01/23/21 0402 01/23/21 0957 01/23/21 2121  WBC  --  24.3*  --   --   --   --  18.6* 16.4* 14.5*  --   --   CREATININE <0.30* 0.42*  --   --   --   --  0.41* 0.36* 0.36*  --   --   LATICACIDVEN  --   --  3.2* 3.7* 3.1* 2.7*  --   --  1.4  --   --   VANCOTROUGH  --   --   --   --   --   --   --   --   --   --  12*  VANCOPEAK  --   --   --   --   --   --   --   --   --  48*  --     Estimated Creatinine Clearance: 110.2 mL/min (A) (by C-G formula based on SCr of 0.36 mg/dL (L)).    Allergies  Allergen Reactions  . Penicillins Other (See Comments)    "everything felt distant"    Antimicrobials this admission:   >>    >>   Dose adjustments this admission:   Microbiology results:  BCx:   UCx:    Sputum:    MRSA PCR:   Thank you for allowing pharmacy to be a part of this patient's care.  Tanysha Quant D 01/23/2021 10:15 PM

## 2021-01-23 NOTE — Progress Notes (Signed)
1 time dose of 4mg  morphine given as ordered by [name]

## 2021-01-23 NOTE — Consult Note (Signed)
[place]  [phone] [phone]   Name: Tyler Gallagher Date: 01/23/2021 MRN: [id number]  DOB: 02/10/1961  Patient Care Team: Juluis Pitch, MD as PCP - General (Family Medicine) Kate Sable, MD as PCP - Cardiology (Cardiology) Telford Nab, RN as Oncology Nurse Navigator Vladimir Crofts, MD as Consulting Physician (Neurology)    REASON FOR CONSULTATION: Tyler Gallagher is a 60 y.o. 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  Patient was hospitalized 01/11/2021-01/17/2021 with recurrent seizures and failure to thrive, for which he underwent PEG placement.  Patient is now readmitted 01/20/2021 with weakness, failure to thrive, and leakage surrounding his PEG.  CT of abdomen/pelvis revealed interval significant worsening of his cancer with enlarging masses in his liver and spleen, small bowel metastasis, bony metastatic disease, and metastatic adenopathy. He was referred to palliative care to help address goals and manage ongoing symptoms..   SOCIAL HISTORY:     reports that he quit smoking about 7 months ago. His smoking use included cigarettes. He has a 64.00 pack-year smoking history. He has never used smokeless tobacco. He reports previous drug use. Drug: Marijuana. He reports that he does not drink alcohol.  Patient lives at home with his girlfriend. His son previously lived in the home but is not currently. He also has a daughter who does not live in the home. Patient previously worked in [employer] but is disabled due to his AKA.  ADVANCE DIRECTIVES:  Not on file  CODE STATUS: DNR  PAST MEDICAL HISTORY: Past Medical History:  Diagnosis Date  . Depression   . Diabetes mellitus without complication ([place])   .  Hyperlipidemia   . Hypertension   . Lesion of brain   . Lung cancer ([place])   . Mass of lung   . Seizures ([place])     PAST SURGICAL HISTORY:  Past Surgical History:  Procedure Laterality Date  . 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 COLONOSCOPY    . CYSTOSCOPY W/ RETROGRADES Bilateral 07/14/2020   Procedure: CYSTOSCOPY WITH RETROGRADE PYELOGRAM;  Surgeon: Hollice Espy, MD;  Location: [facility];  Service: Urology;  Laterality: Bilateral;  . PORTA CATH INSERTION N/A 08/03/2020   Procedure: PORTA CATH INSERTION;  Surgeon: Katha Cabal, MD;  Location: [facility];  Service: Cardiovascular;  Laterality: N/A;  . PORTA CATH INSERTION N/A 10/03/2020   Procedure: PORTA CATH INSERTION;  Surgeon: Algernon Huxley, MD;  Location: [facility];  Service: Cardiovascular;  Laterality: N/A;  . VIDEO BRONCHOSCOPY WITH ENDOBRONCHIAL NAVIGATION N/A 07/01/2020   Procedure: VIDEO BRONCHOSCOPY WITH ENDOBRONCHIAL NAVIGATION;  Surgeon: [name], MD;  Location: [facility];  Service: Thoracic;  Laterality: N/A;  . VIDEO BRONCHOSCOPY WITH ENDOBRONCHIAL ULTRASOUND N/A 07/01/2020   Procedure: VIDEO BRONCHOSCOPY WITH ENDOBRONCHIAL ULTRASOUND;  Surgeon: [name], MD;  Location: [facility];  Service: Thoracic;  Laterality: N/A;    HEMATOLOGY/ONCOLOGY HISTORY:  Oncology History  Malignant neoplasm of lower lobe of right lung ([place])  07/07/2020 Cancer Staging   Staging form: Lung, AJCC 8th Edition - Clinical stage from 07/07/2020: Stage IVA (cT2a, cN0, cM1b) - Signed by [name], MD on 07/08/2020   07/08/2020 Initial Diagnosis   Malignant neoplasm of lower lobe of right lung ([place])   08/15/2020 - 10/20/2020 Chemotherapy  The patient had dexamethasone (DECADRON) 4 MG tablet, 8 mg, Oral, Daily, 1 of 1 cycle, Start date: 07/26/2020, End date: 08/15/2020 palonosetron (ALOXI) injection 0.25 mg, 0.25 mg, Intravenous,  Once, 7 of 7 cycles Administration: 0.25 mg (08/15/2020), 0.25 mg (09/22/2020), 0.25 mg  (09/29/2020), 0.25 mg (09/15/2020), 0.25 mg (10/06/2020), 0.25 mg (10/13/2020), 0.25 mg (10/20/2020) CARBOplatin (PARAPLATIN) 300 mg in sodium chloride 0.9 % 250 mL chemo infusion, 300 mg (100 % of original dose 300 mg), Intravenous,  Once, 7 of 7 cycles Dose modification:   (original dose 300 mg, Cycle 1) Administration: 300 mg (09/22/2020), 300 mg (09/29/2020), 300 mg (09/15/2020), 300 mg (10/06/2020), 300 mg (10/13/2020), 300 mg (10/20/2020) PACLitaxel (TAXOL) 186 mg in sodium chloride 0.9 % 250 mL chemo infusion (</= 80mg /m2), 80 mg/m2 = 186 mg, Intravenous,  Once, 7 of 7 cycles Dose modification: 65 mg/m2 (original dose 80 mg/m2, Cycle 5, Reason: Dose not tolerated) Administration: 186 mg (09/22/2020), 186 mg (09/29/2020), 186 mg (09/15/2020), 150 mg (10/06/2020), 150 mg (10/13/2020), 150 mg (10/20/2020)  for chemotherapy treatment.    12/15/2020 -  Chemotherapy    Patient is on Treatment Plan: LUNG NSCLC FLAT DOSE PEMBROLIZUMAB Q21D        ALLERGIES:  is allergic to penicillins.  MEDICATIONS:  Current Facility-Administered Medications  Medication Dose Route Frequency Provider Last Rate Last Admin  . 0.9 %  sodium chloride infusion   Intravenous PRN Shawna Clamp, MD   Stopped at 01/23/21 [phone]  . acetaminophen (TYLENOL) tablet 650 mg  650 mg Per Tube Q6H PRN Shawna Clamp, MD       Or  . acetaminophen (TYLENOL) suppository 650 mg  650 mg Rectal Q6H PRN Shawna Clamp, MD      . ceFEPIme (MAXIPIME) 2 g in sodium chloride 0.9 % 100 mL IVPB  2 g Intravenous Q8H Hall, Scott A, RPH 200 mL/hr at 01/23/21 1204 2 g at 01/23/21 1204  . Chlorhexidine Gluconate Cloth 2 % PADS 6 each  6 each Topical Daily Shawna Clamp, MD   6 each at 01/23/21 1211  . dexamethasone (DECADRON) injection 4 mg  4 mg Intravenous Q6H Lang Snow, NP   4 mg at 01/23/21 1208  . dronabinol (MARINOL) capsule 5 mg  5 mg Oral BID AC Myles Rosenthal A, MD   5 mg at 01/22/21 1640  . iohexol (OMNIPAQUE) 9 MG/ML oral  solution 1,000 mL  1,000 mL Oral Once PRN Vladimir Crofts, MD      . levETIRAcetam (KEPPRA) 100 MG/ML solution 500 mg  500 mg Per Tube BID Shawna Clamp, MD      . liver oil-zinc oxide (DESITIN) 40 % ointment   Topical Daily [place], Isami, DO   Given at 01/23/21 1033  . metoprolol succinate (TOPROL-XL) 24 hr tablet 12.5 mg  12.5 mg Oral Daily Myles Rosenthal A, MD   12.5 mg at 01/22/21 1004  . morphine 4 MG/ML injection 4 mg  4 mg Intravenous Q2H PRN Satsuki Zillmer, Kirt Boys, NP   4 mg at 01/23/21 1418  . morphine 4 MG/ML injection 4 mg  4 mg Intravenous Once [name], MD      . ondansetron [facility]) tablet 4 mg  4 mg Per Tube Q6H PRN Shawna Clamp, MD       Or  . ondansetron (ZOFRAN) injection 4 mg  4 mg Intravenous Q6H PRN Shawna Clamp, MD      . polyethylene glycol (MIRALAX / GLYCOLAX) packet 17 g  17 g Per Tube  Daily PRN Shawna Clamp, MD      . vancomycin (VANCOREADY) IVPB 1250 mg/250 mL  1,250 mg Intravenous Q12H Hart Robinsons A, RPH 166.7 mL/hr at 01/23/21 0850 1,250 mg at 01/23/21 0850  . [START ON 01/24/2021] vitamin B-12 (CYANOCOBALAMIN) tablet 1,000 mcg  1,000 mcg Per Tube Daily Shawna Clamp, MD       Facility-Administered Medications Ordered in Other Encounters  Medication Dose Route Frequency Provider Last Rate Last Admin  . heparin lock flush 100 unit/mL  500 Units Intravenous Once Charlaine Dalton R, MD      . sodium chloride flush (NS) 0.9 % injection 10 mL  10 mL Intravenous Once Cammie Sickle, MD        VITAL SIGNS: BP 118/69 (BP Location: Left Arm)   Pulse 99   Temp 97.6 F (36.4 C)   Resp 18   Ht 6\' 4"  (1.93 m)   Wt 170 lb 10.2 oz (77.4 kg)   SpO2 94%   BMI 20.77 kg/m  Filed Weights   01/20/21 1313 01/23/21 0046 01/23/21 0408  Weight: 160 lb (72.6 kg) 169 lb 8.5 oz (76.9 kg) 170 lb 10.2 oz (77.4 kg)    Estimated body mass index is 20.77 kg/m as calculated from the following:   Height as of this encounter: 6\' 4"  (1.93 m).   Weight as of this  encounter: 170 lb 10.2 oz (77.4 kg).  LABS: CBC:    Component Value Date/Time   WBC 14.5 (H) 01/23/2021 0402   HGB 10.5 (L) 01/23/2021 0402   HCT 31.8 (L) 01/23/2021 0402   PLT 179 01/23/2021 0402   MCV 91.1 01/23/2021 0402   NEUTROABS 13.2 (H) 01/11/2021 0659   LYMPHSABS 0.7 01/11/2021 0659   MONOABS 1.0 01/11/2021 0659   EOSABS 0.5 01/11/2021 0659   BASOSABS 0.0 01/11/2021 0659   Comprehensive Metabolic Panel:    Component Value Date/Time   NA 135 01/23/2021 0402   K 3.9 01/23/2021 0402   CL 98 01/23/2021 0402   CO2 28 01/23/2021 0402   BUN 17 01/23/2021 0402   CREATININE 0.36 (L) 01/23/2021 0402   GLUCOSE 129 (H) 01/23/2021 0402   CALCIUM 8.1 (L) 01/23/2021 0402   AST 19 01/22/2021 0530   ALT 43 01/22/2021 0530   ALKPHOS 169 (H) 01/22/2021 0530   BILITOT 1.1 01/22/2021 0530   PROT 5.1 (L) 01/22/2021 0530   ALBUMIN 2.2 (L) 01/22/2021 0530    RADIOGRAPHIC STUDIES: DG Chest 2 View  Result Date: 01/20/2021 CLINICAL DATA:  Weakness with known history of lung carcinoma EXAM: CHEST - 2 VIEW COMPARISON:  01/11/2021 FINDINGS: Cardiac shadow is within normal limits. Right-sided chest wall port is seen. Lungs are hyperinflated consistent with COPD. Previously seen right lower lobe lung mass is not well appreciated on today's exam due to its proximity to the cardiac border medially. IMPRESSION: COPD without acute abnormality. Electronically Signed   By: Inez Catalina M.D.   On: 01/20/2021 16:35   DG Chest 2 View  Result Date: 01/11/2021 CLINICAL DATA:  Tachycardia.  History of lung carcinoma EXAM: CHEST - 2 VIEW COMPARISON:  Chest radiograph August 18, 2020; chest CT November 08, 2020 FINDINGS: Port-A-Cath tip is in the superior vena cava. No pneumothorax. There is underlying emphysematous change with apparent bullous disease in the upper lobes. There is no edema or airspace opacity. The previously noted right lower lobe mass seen on most recent CT is not appreciable by radiography.  Heart size is normal. Pulmonary vascularity reflects  underlying emphysematous change. No adenopathy. No bone lesions. IMPRESSION: Port-A-Cath tip in superior vena cava. No pneumothorax. Underlying emphysematous change. No edema or airspace opacity. Right lower lobe mass noted on previous CT not appreciable by radiography. Heart size within normal limits. No adenopathy appreciable by radiography. Emphysema (ICD10-J43.9). Electronically Signed   By: Lowella Grip III M.D.   On: 01/11/2021 08:27   DG Abd 1 View  Result Date: 01/22/2021 CLINICAL DATA:  G-tube placement. EXAM: ABDOMEN - 1 VIEW COMPARISON:  CT abdomen pelvis 01/20/2021 FINDINGS: Contrast material inserted through the G-tube. The oral contrast material appears to be located within the stomach without evidence for extravasation. Left lung bases clear. Gaseous distended loops of small bowel left hemiabdomen. IMPRESSION: G-tube appears to be located within the stomach. Electronically Signed   By: Lovey Newcomer M.D.   On: 01/22/2021 07:24   MR BRAIN W WO CONTRAST  Result Date: 01/06/2021 CLINICAL DATA:  Right-sided weakness, history of metastatic lung cancer with history of resection and whole brain radiation EXAM: MRI HEAD WITHOUT AND WITH CONTRAST TECHNIQUE: Multiplanar, multiecho pulse sequences of the brain and surrounding structures were obtained without and with intravenous contrast. CONTRAST:  8mL GADAVIST GADOBUTROL 1 MMOL/ML IV SOLN COMPARISON:  11/08/2020 FINDINGS: Brain: In the parasagittal left parietal lobe, there is a heterogeneously enhancing, partially necrotic lesion measuring 1.6 x 1.2 cm at the location of prior 0.6 cm lesion. There is extensive surrounding edema. Slightly increased enhancement in the parasagittal left frontal lobe at the inferior margin of the resection cavity. The punctate foci of enhancement in the left frontoparietal region on the prior study are not seen although there is greater motion artifact on this study.  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 There is no acute infarction. No hydrocephalus. No significant mass effect. Vascular: Major vessel flow voids at the skull base are preserved. Skull and upper cervical spine: Left vertex craniotomy. Normal marrow signal is preserved. Sinuses/Orbits: Paranasal sinuses are aerated. Orbits are unremarkable. Other: Sella is unremarkable.  Mastoid air cells are clear. IMPRESSION: Increase in size and heterogeneity of parasagittal left parietal lesion with new extensive surrounding edema. Likely reflects progression of metastatic disease. However, given history of radiation, disproportionate edema, and appearance of enhancement, treatment effect is a consideration. Punctate foci of enhancement in the left frontoparietal region are no longer seen. Electronically Signed   By: Macy Mis M.D.   On: 01/06/2021 17:32   CT ABDOMEN PELVIS W CONTRAST  Result Date: 01/20/2021 CLINICAL DATA:  Generalized weakness, decreased bowel movements, leaking around feeding tube, history of metastatic small cell lung cancer EXAM: CT ABDOMEN AND PELVIS WITH CONTRAST TECHNIQUE: Multidetector CT imaging of the abdomen and pelvis was performed using the standard protocol following bolus administration of intravenous contrast. CONTRAST:  193mL OMNIPAQUE IOHEXOL 300 MG/ML  SOLN COMPARISON:  11/08/2020 FINDINGS: Lower chest: Partial visualization of known right lower lobe malignancy, measuring approximately 1.6 x 1.4 cm image 1 of series 2. No airspace disease, effusion, or pneumothorax. Hepatobiliary: Since the previous exam, marked progression of known intrahepatic metastases. Multiple large confluent lesions are seen throughout the liver, largest in the left lobe measuring 7.8 x 5.7 cm reference image 8 of series 2. This measures approximately 1.3 cm on previous exam. Multiple gallstones are identified without cholecystitis. No biliary dilation. Pancreas: Unremarkable. No  pancreatic ductal dilatation or surrounding inflammatory changes. Spleen: Enlarging hypodensity ventral aspect of the spleen measuring 2.7 x 2.2 cm image 23 of series 2 consistent with  progressive metastatic disease. Adrenals/Urinary Tract: Stable left renal cyst. The kidneys enhance normally and symmetrically. No urinary tract calculi or obstruction. Bladder and adrenals are unremarkable. Stomach/Bowel: No evidence of high-grade bowel obstruction or ileus. Percutaneous gastrostomy tube is seen within the gastric lumen. There is gas dissecting along the catheter tubing within the abdominal wall. No fluid collection. There is a segment of jejunum with significant wall thickening measuring up to 1.9 cm, image 42 series 2 within the left upper quadrant, suspicious for metastatic disease. Vascular/Lymphatic: Multiple enlarged lymph nodes are seen within the central mesentery, largest measuring 2.7 by 3.5 cm image 41 of series 2, consistent with metastatic disease. Stable atherosclerosis of the abdominal aorta without aneurysm or dissection. Reproductive: Prostate is unremarkable. Other: No free fluid or free intraperitoneal gas. No abdominal wall hernia. Musculoskeletal: Metastatic lesion within the abdominal wall musculature of the right flank measuring 2.4 cm on image 38 of series 2. There is a lytic lesion within the left iliac bone abutting the anterior margin of the sacroiliac joint. Large lytic lesion partially visualized within the proximal left femoral diaphysis. These are consistent with metastatic disease. The left femoral lesion is at risk for fracture. IMPRESSION: 1. Marked progression of metastatic disease. 2. Enlarging masses throughout the liver and spleen. 3. Metastatic adenopathy within the central mesentery and retroperitoneum. 4. Small bowel metastasis involving the jejunum within the left upper quadrant, without high-grade obstruction. 5. Bony metastatic disease as above, with large lytic lesion  involving the proximal left femoral diaphysis only partially imaged on this exam. Given the degree of lucency and location, this is at risk for fracture. 6. Partially visualized right lower lobe mass consistent with primary malignancy. 7. Indwelling PEG tube. There is gas along the catheter tubing, without evidence of fluid collection or abscess. Electronically Signed   By: Randa Ngo M.D.   On: 01/20/2021 20:29   CT HEAD CODE STROKE WO CONTRAST  Result Date: 01/06/2021 CLINICAL DATA:  Code stroke.  Neuro deficit, acute stroke suspected. EXAM: CT HEAD WITHOUT CONTRAST TECHNIQUE: Contiguous axial images were obtained from the base of the skull through the vertex without intravenous contrast. COMPARISON:  MRI November 08, 2020.  CT head June 21, 2020. FINDINGS: Brain: There is new exuberant vasogenic edema in the high left parietal lobe with suspected mass in this region (see series 3, image 24). Local sulcal effacement without midline shift. Small area of encephalomalacia in the high right parafalcine region, compatible with sequela of prior metastatic lesion resection. Remote right frontal infarct with encephalomalacia. No evidence of acute large vascular territory infarct. No acute hemorrhage. No hydrocephalus. Additional patchy white matter hypoattenuation, most likely related to chronic microvascular ischemic disease Vascular: Calcific atherosclerosis. Skull: No acute fracture. Sinuses/Orbits: Mild paranasal sinus mucosal thickening with polyp versus retention cyst in the right maxillary sinus. Other: Small left mastoid effusion. IMPRESSION: New vasogenic edema in the high left parietal lobe with suspected mass in this region, concerning for progression of the patient's known metastatic disease. Local sulcal effacement without midline shift. Recommend MRI with contrast to further evaluate. Findings discussed with Dr. Milas Gain via telephone at 2:46 p.m. Electronically Signed   By: Margaretha Sheffield MD   On:  01/06/2021 14:49    PERFORMANCE STATUS (ECOG) : 4 - Bedbound  Review of Systems Unless otherwise noted, a complete review of systems is negative.  Physical Exam General: NAD, frail appearing Pulmonary: Unlabored Abdomen: PEG with significant surrounding excoriation and erythema GU: no suprapubic tenderness Extremities: Left AKA  Skin: no rashes Neurological: Weakness but otherwise nonfocal  IMPRESSION: Patient well-known to me from the clinic.  Patient verbalized understanding that imaging reveals significant progression of his cancer.  He says that he has been told and feels that he is nearing end-of-life.  We discussed his overall goals, which patient states are aligned with comfort.  He is not interested in pursuing further treatment for the cancer.  Instead, patient is interested in discharging home under hospice care.  I have discussed his case with care management and the hospice liaison who will coordinate arranging hospice services.  Symptomatically, patient has severe pain in his abdomen especially at the site of his PEG.  This is noted to have severe excoriation and erythema.  Patient states the pain is much worse when he tried to drink some liquids earlier today.  Case discussed with hospitalist who will consult WOC to see if they have any recommendations regarding periwound skin care and management of leakage surrounding PEG.  Patient is pending tube study by IR.  Patient verbalized desire to have the PEG removed initially but was ultimately okay with pursuing work-up.  I will increase the dose and frequency of his morphine for pain.  PLAN: -Best supportive care -WOC for skin management -Liberalize morphine dose and frequency for comfort -Will follow  Case and plan discussed with Dr. Dwyane Dee and [name]   Time Total: 60 minutes  Visit consisted of counseling and education dealing with the complex and emotionally intense issues of symptom management and palliative care in  the setting of serious and potentially life-threatening illness.Greater than 50%  of this time was spent counseling and coordinating care related to the above assessment and plan.  Signed by: Altha Harm, PhD, NP-C

## 2021-01-23 NOTE — Progress Notes (Signed)
Hematology/Oncology Consult note [facility]  [phone] [phone]  Patient Care Team: Juluis Pitch, MD as PCP - General (Family Medicine) Kate Sable, MD as PCP - Cardiology (Cardiology) Telford Nab, RN as Oncology Nurse Navigator Vladimir Crofts, MD as Consulting Physician (Neurology)   Name of the patient: Tyler Gallagher  [id number]  [birth year]   Date of visit: 01/23/2021   Interval history- Patient reports discomfort around his peg tube site and wants it taken out. He is requiring pain meds more frequently  ECOG PS- 3 Pain scale- 7   Review of systems- Review of Systems  Constitutional: Positive for malaise/fatigue. Negative for chills, fever and weight loss.  HENT: Negative for congestion, ear discharge and nosebleeds.   Eyes: Negative for blurred vision.  Respiratory: Negative for cough, hemoptysis, sputum production, shortness of breath and wheezing.   Cardiovascular: Negative for chest pain, palpitations, orthopnea and claudication.  Gastrointestinal: Positive for abdominal pain. Negative for blood in stool, constipation, diarrhea, heartburn, melena, nausea and vomiting.  Genitourinary: Negative for dysuria, flank pain, frequency, hematuria and urgency.  Musculoskeletal: Negative for back pain, joint pain and myalgias.  Skin: Negative for rash.  Neurological: Negative for dizziness, tingling, focal weakness, seizures, weakness and headaches.  Endo/Heme/Allergies: Does not bruise/bleed easily.  Psychiatric/Behavioral: Negative for depression and suicidal ideas. The patient does not have insomnia.       Allergies  Allergen Reactions  . Penicillins Other (See Comments)    "everything felt distant"     Past Medical History:  Diagnosis Date  . Depression   . Diabetes mellitus without complication ([place])   . Hyperlipidemia   . Hypertension   . Lesion of brain   . Lung cancer ([place])   . Mass of lung   .  Seizures ([place])      Past Surgical History:  Procedure Laterality Date  . 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 COLONOSCOPY    . CYSTOSCOPY W/ RETROGRADES Bilateral 07/14/2020   Procedure: CYSTOSCOPY WITH RETROGRADE PYELOGRAM;  Surgeon: Hollice Espy, MD;  Location: [facility] ORS;  Service: Urology;  Laterality: Bilateral;  . PORTA CATH INSERTION N/A 08/03/2020   Procedure: PORTA CATH INSERTION;  Surgeon: Katha Cabal, MD;  Location: [place] [facility];  Service: Cardiovascular;  Laterality: N/A;  . PORTA CATH INSERTION N/A 10/03/2020   Procedure: PORTA CATH INSERTION;  Surgeon: Algernon Huxley, MD;  Location: [place] [facility];  Service: Cardiovascular;  Laterality: N/A;  . VIDEO BRONCHOSCOPY WITH ENDOBRONCHIAL NAVIGATION N/A 07/01/2020   Procedure: VIDEO BRONCHOSCOPY WITH ENDOBRONCHIAL NAVIGATION;  Surgeon: [name], MD;  Location: [facility] ORS;  Service: Thoracic;  Laterality: N/A;  . VIDEO BRONCHOSCOPY WITH ENDOBRONCHIAL ULTRASOUND N/A 07/01/2020   Procedure: VIDEO BRONCHOSCOPY WITH ENDOBRONCHIAL ULTRASOUND;  Surgeon: [name], MD;  Location: [facility] OR[facility];  Service: Thoracic;  Laterality: N/A;    Social History   Socioeconomic History  . Marital status: Divorced    Spouse name: Not on file  . Number of children: Not on file  . Years of education: Not on file  . Highest education level: Not on file  Occupational History  . Not on file  Tobacco Use  . Smoking status: Former Smoker    Packs/day: 2.00    Years: 32.00    Pack years: 64.00    Types: Cigarettes    Quit date: 06/21/2020    Years since quitting: 0.5  . Smokeless tobacco: Never Used  Vaping Use  . Vaping  Use: Never used  Substance and Sexual Activity  . Alcohol use: No    Alcohol/week: 0.0 standard drinks  . Drug use: Not Currently    Types: Marijuana  . Sexual activity: Not on file  Other Topics Concern  . Not on file  Social History Narrative   Lives at home with girlfriend   Social Determinants of Health    Financial Resource Strain: Not on file  Food Insecurity: Not on file  Transportation Needs: Not on file  Physical Activity: Not on file  Stress: Not on file  Social Connections: Not on file  Intimate Partner Violence: Not on file    Family History  Problem Relation Age of Onset  . Diabetes Sister   . Diabetes Brother      Current Facility-Administered Medications:  .  0.9 %  sodium chloride infusion, , Intravenous, PRN, Shawna Clamp, MD, Stopped at 01/23/21 [phone] .  acetaminophen (TYLENOL) tablet 650 mg, 650 mg, Per Tube, Q6H PRN **OR** acetaminophen (TYLENOL) suppository 650 mg, 650 mg, Rectal, Q6H PRN, Shawna Clamp, MD .  ceFEPIme (MAXIPIME) 2 g in sodium chloride 0.9 % 100 mL IVPB, 2 g, Intravenous, Q8H, Hall, Scott A, RPH, Last Rate: 200 mL/hr at 01/23/21 1204, 2 g at 01/23/21 1204 .  Chlorhexidine Gluconate Cloth 2 % PADS 6 each, 6 each, Topical, Daily, Shawna Clamp, MD, 6 each at 01/23/21 1211 .  dexamethasone (DECADRON) injection 4 mg, 4 mg, Intravenous, Q6H, O[place], Bing Neighbors, NP, 4 mg at 01/23/21 1208 .  dronabinol (MARINOL) capsule 5 mg, 5 mg, Oral, BID AC, Myles Rosenthal A, MD, 5 mg at 01/22/21 1640 .  iohexol (OMNIPAQUE) 9 MG/ML oral solution 1,000 mL, 1,000 mL, Oral, Once PRN, Vladimir Crofts, MD .  levETIRAcetam (KEPPRA) 100 MG/ML solution 500 mg, 500 mg, Per Tube, BID, Shawna Clamp, MD .  liver oil-zinc oxide (DESITIN) 40 % ointment, , Topical, Daily, Sakai, Isami, DO, Given at 01/23/21 1033 .  metoprolol succinate (TOPROL-XL) 24 hr tablet 12.5 mg, 12.5 mg, Oral, Daily, Myles Rosenthal A, MD, 12.5 mg at 01/22/21 1004 .  morphine 4 MG/ML injection 4 mg, 4 mg, Intravenous, Q2H PRN, Borders, Vonna Kotyk R, NP, 4 mg at 01/23/21 1418 .  ondansetron (ZOFRAN) tablet 4 mg, 4 mg, Per Tube, Q6H PRN **OR** ondansetron (ZOFRAN) injection 4 mg, 4 mg, Intravenous, Q6H PRN, Shawna Clamp, MD .  polyethylene glycol (MIRALAX / GLYCOLAX) packet 17 g, 17 g, Per Tube, Daily  PRN, Shawna Clamp, MD .  vancomycin (VANCOREADY) IVPB 1250 mg/250 mL, 1,250 mg, Intravenous, Q12H, Hall, Scott A, RPH, Last Rate: 166.7 mL/hr at 01/23/21 0850, 1,250 mg at 01/23/21 0850 .  [START ON 01/24/2021] vitamin B-12 (CYANOCOBALAMIN) tablet 1,000 mcg, 1,000 mcg, Per Tube, Daily, Shawna Clamp, MD  Facility-Administered Medications Ordered in Other Encounters:  .  heparin lock flush 100 unit/mL, 500 Units, Intravenous, Once, Brahmanday, Govinda R, MD .  sodium chloride flush (NS) 0.9 % injection 10 mL, 10 mL, Intravenous, Once, Cammie Sickle, MD  Physical exam:  Vitals:   01/23/21 0439 01/23/21 0837 01/23/21 1113 01/23/21 1457  BP: 101/72 106/79 92/71 118/69  Pulse: 66 72 88 99  Resp: 16 16 18 18   Temp: 97.7 F (36.5 C) 97.7 F (36.5 C) 98.6 F (37 C) 97.6 F (36.4 C)  TempSrc: Oral Oral    SpO2: 96% 94% 97% 94%  Weight:      Height:       Physical Exam Constitutional:  Comments: Appears fatigued.   Eyes:     Extraocular Movements: EOM normal.  Cardiovascular:     Rate and Rhythm: Normal rate and regular rhythm.     Heart sounds: Normal heart sounds.  Pulmonary:     Effort: Pulmonary effort is normal.     Breath sounds: Normal breath sounds.  Abdominal:     Comments: Peg tube in place with skin excoriation around it  Musculoskeletal:     Comments: S/p left AKA  Skin:    General: Skin is warm and dry.  Neurological:     Mental Status: He is alert and oriented to person, place, and time.      CMP Latest Ref Rng & Units 01/23/2021  Glucose 70 - 99 mg/dL 129(H)  BUN 6 - 20 mg/dL 17  Creatinine 0.61 - 1.24 mg/dL 0.36(L)  Sodium 135 - 145 mmol/L 135  Potassium 3.5 - 5.1 mmol/L 3.9  Chloride 98 - 111 mmol/L 98  CO2 22 - 32 mmol/L 28  Calcium 8.9 - 10.3 mg/dL 8.1(L)  Total Protein 6.5 - 8.1 g/dL -  Total Bilirubin 0.3 - 1.2 mg/dL -  Alkaline Phos 38 - 126 U/L -  AST 15 - 41 U/L -  ALT 0 - 44 U/L -   CBC Latest Ref Rng & Units 01/23/2021  WBC 4.0  - 10.5 K/uL 14.5(H)  Hemoglobin 13.0 - 17.0 g/dL 10.5(L)  Hematocrit 39.0 - 52.0 % 31.8(L)  Platelets 150 - 400 K/uL 179    @IMAGES @  DG Chest 2 View  Result Date: 01/20/2021 CLINICAL DATA:  Weakness with known history of lung carcinoma EXAM: CHEST - 2 VIEW COMPARISON:  01/11/2021 FINDINGS: Cardiac shadow is within normal limits. Right-sided chest wall port is seen. Lungs are hyperinflated consistent with COPD. Previously seen right lower lobe lung mass is not well appreciated on today's exam due to its proximity to the cardiac border medially. IMPRESSION: COPD without acute abnormality. Electronically Signed   By: Inez Catalina M.D.   On: 01/20/2021 16:35   DG Chest 2 View  Result Date: 01/11/2021 CLINICAL DATA:  Tachycardia.  History of lung carcinoma EXAM: CHEST - 2 VIEW COMPARISON:  Chest radiograph August 18, 2020; chest CT November 08, 2020 FINDINGS: Port-A-Cath tip is in the superior vena cava. No pneumothorax. There is underlying emphysematous change with apparent bullous disease in the upper lobes. There is no edema or airspace opacity. The previously noted right lower lobe mass seen on most recent CT is not appreciable by radiography. Heart size is normal. Pulmonary vascularity reflects underlying emphysematous change. No adenopathy. No bone lesions. IMPRESSION: Port-A-Cath tip in superior vena cava. No pneumothorax. Underlying emphysematous change. No edema or airspace opacity. Right lower lobe mass noted on previous CT not appreciable by radiography. Heart size within normal limits. No adenopathy appreciable by radiography. Emphysema (ICD10-J43.9). Electronically Signed   By: Lowella Grip III M.D.   On: 01/11/2021 08:27   DG Abd 1 View  Result Date: 01/22/2021 CLINICAL DATA:  G-tube placement. EXAM: ABDOMEN - 1 VIEW COMPARISON:  CT abdomen pelvis 01/20/2021 FINDINGS: Contrast material inserted through the G-tube. The oral contrast material appears to be located within the stomach  without evidence for extravasation. Left lung bases clear. Gaseous distended loops of small bowel left hemiabdomen. IMPRESSION: G-tube appears to be located within the stomach. Electronically Signed   By: Lovey Newcomer M.D.   On: 01/22/2021 07:24   MR BRAIN W WO CONTRAST  Result Date: 01/06/2021 CLINICAL DATA:  Right-sided weakness, history of metastatic lung cancer with history of resection and whole brain radiation EXAM: MRI HEAD WITHOUT AND WITH CONTRAST TECHNIQUE: Multiplanar, multiecho pulse sequences of the brain and surrounding structures were obtained without and with intravenous contrast. CONTRAST:  41mL GADAVIST GADOBUTROL 1 MMOL/ML IV SOLN COMPARISON:  11/08/2020 FINDINGS: Brain: In the parasagittal left parietal lobe, there is a heterogeneously enhancing, partially necrotic lesion measuring 1.6 x 1.2 cm at the location of prior 0.6 cm lesion. There is extensive surrounding edema. Slightly increased enhancement in the parasagittal left frontal lobe at the inferior margin of the resection cavity. The punctate foci of enhancement in the left frontoparietal region on the prior study are not seen although there is greater motion artifact on this study. 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 There is no acute infarction. No hydrocephalus. No significant mass effect. Vascular: Major vessel flow voids at the skull base are preserved. Skull and upper cervical spine: Left vertex craniotomy. Normal marrow signal is preserved. Sinuses/Orbits: Paranasal sinuses are aerated. Orbits are unremarkable. Other: Sella is unremarkable.  Mastoid air cells are clear. IMPRESSION: Increase in size and heterogeneity of parasagittal left parietal lesion with new extensive surrounding edema. Likely reflects progression of metastatic disease. However, given history of radiation, disproportionate edema, and appearance of enhancement, treatment effect is a consideration. Punctate foci of enhancement in the left  frontoparietal region are no longer seen. Electronically Signed   By: Macy Mis M.D.   On: 01/06/2021 17:32   CT ABDOMEN PELVIS W CONTRAST  Result Date: 01/20/2021 CLINICAL DATA:  Generalized weakness, decreased bowel movements, leaking around feeding tube, history of metastatic small cell lung cancer EXAM: CT ABDOMEN AND PELVIS WITH CONTRAST TECHNIQUE: Multidetector CT imaging of the abdomen and pelvis was performed using the standard protocol following bolus administration of intravenous contrast. CONTRAST:  170mL OMNIPAQUE IOHEXOL 300 MG/ML  SOLN COMPARISON:  11/08/2020 FINDINGS: Lower chest: Partial visualization of known right lower lobe malignancy, measuring approximately 1.6 x 1.4 cm image 1 of series 2. No airspace disease, effusion, or pneumothorax. Hepatobiliary: Since the previous exam, marked progression of known intrahepatic metastases. Multiple large confluent lesions are seen throughout the liver, largest in the left lobe measuring 7.8 x 5.7 cm reference image 8 of series 2. This measures approximately 1.3 cm on previous exam. Multiple gallstones are identified without cholecystitis. No biliary dilation. Pancreas: Unremarkable. No pancreatic ductal dilatation or surrounding inflammatory changes. Spleen: Enlarging hypodensity ventral aspect of the spleen measuring 2.7 x 2.2 cm image 23 of series 2 consistent with progressive metastatic disease. Adrenals/Urinary Tract: Stable left renal cyst. The kidneys enhance normally and symmetrically. No urinary tract calculi or obstruction. Bladder and adrenals are unremarkable. Stomach/Bowel: No evidence of high-grade bowel obstruction or ileus. Percutaneous gastrostomy tube is seen within the gastric lumen. There is gas dissecting along the catheter tubing within the abdominal wall. No fluid collection. There is a segment of jejunum with significant wall thickening measuring up to 1.9 cm, image 42 series 2 within the left upper quadrant, suspicious for  metastatic disease. Vascular/Lymphatic: Multiple enlarged lymph nodes are seen within the central mesentery, largest measuring 2.7 by 3.5 cm image 41 of series 2, consistent with metastatic disease. Stable atherosclerosis of the abdominal aorta without aneurysm or dissection. Reproductive: Prostate is unremarkable. Other: No free fluid or free intraperitoneal gas. No abdominal wall hernia. Musculoskeletal: Metastatic lesion within the abdominal wall musculature of the right flank measuring 2.4 cm on image 38 of series 2. There  is a lytic lesion within the left iliac bone abutting the anterior margin of the sacroiliac joint. Large lytic lesion partially visualized within the proximal left femoral diaphysis. These are consistent with metastatic disease. The left femoral lesion is at risk for fracture. IMPRESSION: 1. Marked progression of metastatic disease. 2. Enlarging masses throughout the liver and spleen. 3. Metastatic adenopathy within the central mesentery and retroperitoneum. 4. Small bowel metastasis involving the jejunum within the left upper quadrant, without high-grade obstruction. 5. Bony metastatic disease as above, with large lytic lesion involving the proximal left femoral diaphysis only partially imaged on this exam. Given the degree of lucency and location, this is at risk for fracture. 6. Partially visualized right lower lobe mass consistent with primary malignancy. 7. Indwelling PEG tube. There is gas along the catheter tubing, without evidence of fluid collection or abscess. Electronically Signed   By: Randa Ngo M.D.   On: 01/20/2021 20:29   CT HEAD CODE STROKE WO CONTRAST  Result Date: 01/06/2021 CLINICAL DATA:  Code stroke.  Neuro deficit, acute stroke suspected. EXAM: CT HEAD WITHOUT CONTRAST TECHNIQUE: Contiguous axial images were obtained from the base of the skull through the vertex without intravenous contrast. COMPARISON:  MRI November 08, 2020.  CT head June 21, 2020. FINDINGS:  Brain: There is new exuberant vasogenic edema in the high left parietal lobe with suspected mass in this region (see series 3, image 24). Local sulcal effacement without midline shift. Small area of encephalomalacia in the high right parafalcine region, compatible with sequela of prior metastatic lesion resection. Remote right frontal infarct with encephalomalacia. No evidence of acute large vascular territory infarct. No acute hemorrhage. No hydrocephalus. Additional patchy white matter hypoattenuation, most likely related to chronic microvascular ischemic disease Vascular: Calcific atherosclerosis. Skull: No acute fracture. Sinuses/Orbits: Mild paranasal sinus mucosal thickening with polyp versus retention cyst in the right maxillary sinus. Other: Small left mastoid effusion. IMPRESSION: New vasogenic edema in the high left parietal lobe with suspected mass in this region, concerning for progression of the patient's known metastatic disease. Local sulcal effacement without midline shift. Recommend MRI with contrast to further evaluate. Findings discussed with Dr. Milas Gain via telephone at 2:46 p.m. Electronically Signed   By: Margaretha Sheffield MD   On: 01/06/2021 14:49     Assessment and plan- Patient is a 60 y.o. male with metastatic NSCLC admitted for peg tube dysfunction found to have disease progression on scans  - Discussed with patient and his significant other that he has significant disease progression in liver and bones. He has tolerated carbo/taxol RT poorly in the past and now has progression on keytruda. Performance status has declined considerably in the last few weeks. He is not a candidate for further chemo. Patient does not desire any further treatment as well. He wishes to go home with hospice and remain comfortable. Palliative care has already seen him today. Plan is for home hospice  Leakage around peg tube remains a concern. I have asked Dr. Dahlia Byes to see him today to see if peg tube can come  out as per patients wishes.  Patient is on prn morphine and dilaudid for pain   Visit Diagnosis 1. Sepsis without acute organ dysfunction, due to unspecified organism ([place])   2. Weakness   3. Metastatic cancer ([place])   4. Gastrostomy complication (H[facility])   5. Leaking PEG tube ([place])   6. Failure to thrive in adult      Dr. Randa Evens, MD, MPH [place] at [facility]  [facility] [phone] 01/23/2021 3:21 PM

## 2021-01-23 NOTE — Progress Notes (Signed)
Patient continues to have 10/10 abdominal pain.One time dose of  Ativan 2 mg given as ordered by Dr. Dahlia Byes.

## 2021-01-24 DIAGNOSIS — R627 Adult failure to thrive: Secondary | ICD-10-CM | POA: Diagnosis not present

## 2021-01-24 DIAGNOSIS — Z515 Encounter for palliative care: Secondary | ICD-10-CM | POA: Diagnosis not present

## 2021-01-24 LAB — CBC
HCT: 35.2 % — ABNORMAL LOW (ref 39.0–52.0)
Hemoglobin: 11.7 g/dL — ABNORMAL LOW (ref 13.0–17.0)
MCH: 30.4 pg (ref 26.0–34.0)
MCHC: 33.2 g/dL (ref 30.0–36.0)
MCV: 91.4 fL (ref 80.0–100.0)
Platelets: 190 10*3/uL (ref 150–400)
RBC: 3.85 MIL/uL — ABNORMAL LOW (ref 4.22–5.81)
RDW: 16.9 % — ABNORMAL HIGH (ref 11.5–15.5)
WBC: 14.7 10*3/uL — ABNORMAL HIGH (ref 4.0–10.5)
nRBC: 0 % (ref 0.0–0.2)

## 2021-01-24 LAB — COMPREHENSIVE METABOLIC PANEL
ALT: 28 U/L (ref 0–44)
AST: 18 U/L (ref 15–41)
Albumin: 2.2 g/dL — ABNORMAL LOW (ref 3.5–5.0)
Alkaline Phosphatase: 155 U/L — ABNORMAL HIGH (ref 38–126)
Anion gap: 10 (ref 5–15)
BUN: 20 mg/dL (ref 6–20)
CO2: 26 mmol/L (ref 22–32)
Calcium: 8.3 mg/dL — ABNORMAL LOW (ref 8.9–10.3)
Chloride: 100 mmol/L (ref 98–111)
Creatinine, Ser: 0.34 mg/dL — ABNORMAL LOW (ref 0.61–1.24)
GFR, Estimated: >60 mL/min (ref >60)
Glucose, Bld: 86 mg/dL (ref 70–99)
Potassium: 3.6 mmol/L (ref 3.5–5.1)
Sodium: 136 mmol/L (ref 135–145)
Total Bilirubin: 1.3 mg/dL — ABNORMAL HIGH (ref 0.3–1.2)
Total Protein: 5.4 g/dL — ABNORMAL LOW (ref 6.5–8.1)

## 2021-01-24 LAB — PHOSPHORUS: Phosphorus: 2.3 mg/dL — ABNORMAL LOW (ref 2.5–4.6)

## 2021-01-24 LAB — MAGNESIUM: Magnesium: 2.3 mg/dL (ref 1.7–2.4)

## 2021-01-24 MED ORDER — HYDROMORPHONE HCL 1 MG/ML IJ SOLN
1.0000 mg | INTRAMUSCULAR | Status: DC | PRN
Start: 2021-01-24 — End: 2021-01-24
  Administered 2021-01-24: 1 mg via INTRAVENOUS
  Filled 2021-01-24: qty 1

## 2021-01-24 MED ORDER — SILVER SULFADIAZINE 1 % EX CREA
TOPICAL_CREAM | Freq: Three times a day (TID) | CUTANEOUS | Status: DC
  Filled 2021-01-24: qty 85

## 2021-01-24 MED ORDER — FENTANYL 25 MCG/HR TD PT72
1.0000 | MEDICATED_PATCH | TRANSDERMAL | Status: DC
  Administered 2021-01-24: 1 via TRANSDERMAL

## 2021-01-24 MED ORDER — HYDROMORPHONE HCL 1 MG/ML PO LIQD: 2.0000 mg | ORAL | Status: DC | PRN

## 2021-01-24 MED ORDER — PANTOPRAZOLE SODIUM 40 MG IV SOLR
40.0000 mg | INTRAVENOUS | Status: DC
  Administered 2021-01-24: 40 mg via INTRAVENOUS
  Filled 2021-01-24: qty 40

## 2021-01-24 MED ORDER — LEVETIRACETAM 100 MG/ML PO SOLN
500.0000 mg | Freq: Two times a day (BID) | ORAL | Status: DC
  Filled 2021-01-24: qty 5

## 2021-01-24 MED ORDER — OXYCODONE HCL 5 MG PO TABS: 5.0000 mg | ORAL_TABLET | Freq: Three times a day (TID) | ORAL | 0 refills | Status: DC | PRN

## 2021-01-24 NOTE — TOC Initial Note (Signed)
Transition of Care [facility]) - Initial/Assessment Note    Patient Details  Name: Tyler Gallagher MRN: [id number] Date of Birth: [birth year]  Transition of Care [facility]) CM/SW Contact:    Shelbie Ammons, RN Phone Number: 01/24/2021, 8:44 AM  Clinical Narrative:   RNCM met with patient at bedside, patient alert and verbally responsive, reports to feeling ok but is starting to have return of pain. Discussed discharge planning with patient and recommendation for Hospice services at home. Patient verbalizes that this is his wish and he would like to get home as soon as possible. RNCM discussed which Hopsice agency patient would prefer and he relayed that he didn't really care. Patient called his significant other Rosalyn and discussion was had with her. Rosalyn relayed that she would prefer [facility] because they have the Hospice Home if it is ever needed, patient is in agreement. Rosalyn relays that patient will need a hospital bed, overbed table and bedside commode. RNCM reached out to [facility] and provided referral, let her know as well that girlfriend plans to be at hospital late morning.     Expected Discharge Plan: Home w Hospice Care Barriers to Discharge: No Barriers Identified   Patient Goals and CMS Choice Patient states their goals for this hospitalization and ongoing recovery are:: I want to go home      Expected Discharge Plan and Services Expected Discharge Plan: [place] arrangements for the past 2 months: Single Family Home                                      Prior Living Arrangements/Services Living arrangements for the past 2 months: Single Family Home Lives with:: Significant Other,Adult Children Patient language and need for interpreter reviewed:: Yes Do you feel safe going back to the place where you live?: Yes      Need for Family Participation in Patient Care: Yes (Comment) Care giver support system in place?:  Yes (comment)   Criminal Activity/Legal Involvement Pertinent to Current Situation/Hospitalization: No - Comment as needed  Activities of Daily Living Home Assistive Devices/Equipment: Wheelchair ADL Screening (condition at time of admission) Patient's cognitive ability adequate to safely complete daily activities?: Yes Is the patient deaf or have difficulty hearing?: No Does the patient have difficulty seeing, even when wearing glasses/contacts?: No Does the patient have difficulty concentrating, remembering, or making decisions?: Yes Patient able to express need for assistance with ADLs?: Yes Does the patient have difficulty dressing or bathing?: Yes Independently performs ADLs?: No Communication: Independent Dressing (OT): Needs assistance Is this a change from baseline?: Pre-admission baseline Grooming: Needs assistance Is this a change from baseline?: Pre-admission baseline Feeding: Needs assistance Is this a change from baseline?: Pre-admission baseline Bathing: Needs assistance Is this a change from baseline?: Pre-admission baseline Toileting: Needs assistance Is this a change from baseline?: Pre-admission baseline In/Out Bed: Needs assistance Is this a change from baseline?: Pre-admission baseline Walks in Home: Needs assistance Is this a change from baseline?: Pre-admission baseline Does the patient have difficulty walking or climbing stairs?: Yes Weakness of Legs: Right Weakness of Arms/Hands: Both  Permission Sought/Granted                  Emotional Assessment Appearance:: Appears older than stated age Attitude/Demeanor/Rapport: Guarded Affect (typically observed): Calm Orientation: : Oriented to Self,Oriented to Place,Oriented to  Time,Oriented  to Situation Alcohol / Substance Use: Not Applicable Psych Involvement: No (comment)  Admission diagnosis:  Metastatic cancer ([place]) [C79.9] Gastrostomy complication ([place]) [E43.53] Weakness [R53.1] Failure to  thrive in adult [R62.7] Sepsis without acute organ dysfunction, due to unspecified organism [facility]) [A41.9] Patient Active Problem List   Diagnosis Date Noted  . Gastrostomy complication ([place])   . Failure to thrive in adult 01/21/2021  . Protein-calorie malnutrition, severe 01/12/2021  . Seizure ([place]) 01/11/2021  . HTN (hypertension) 01/11/2021  . Seizures ([place])   . Leucocytosis   . Protein-calorie malnutrition, moderate ([place])   . Generalized weakness   . Dizziness 11/08/2020  . Vertigo 11/08/2020  . Chronic systolic CHF (congestive heart failure) ([place]) 11/08/2020  . Hypomagnesemia 11/08/2020  . Hypotension 11/08/2020  . Hypokalemia 11/08/2020  . Nausea vomiting and diarrhea 11/08/2020  . Anxiety 11/08/2020  . Palliative care encounter   . Chemotherapy induced neutropenia ([place]) 09/29/2020  . Goals of care, counseling/discussion 07/08/2020  . Malignant neoplasm of lower lobe of right lung ([place]) 07/08/2020  . Brain metastases ([place]) 06/22/2020  . Vasogenic brain edema ([place]) 06/21/2020  . Hyperlipidemia 01/02/2016  . Hypertriglyceridemia 01/02/2016  . Tobacco abuse 01/02/2016  . Type 2 diabetes mellitus without complication, without long-term current use of insulin ([place]) 01/02/2016  . Obesity 01/02/2016  . S/P AKA (above knee amputation) unilateral ([place]) 01/02/2016  . Amaurosis fugax 01/01/2016  . TIA (transient ischemic attack) 01/01/2016   PCP:  Juluis Pitch, MD Pharmacy:   [id number] [place] [address] [id number]: [phone]: [phone]    Social Determinants of Health (SDOH) Interventions    Readmission Risk Interventions Readmission Risk Prevention Plan 01/16/2021  Transportation Screening Complete  PCP or Specialist Appt within 3-5 Days Complete  [place]RI or [place]  Social Work Consult for [place]/Counseling Complete  Palliative Care Screening Not Applicable  Medication  Review (Press photographer Complete  Some recent data might be hidden

## 2021-01-24 NOTE — Progress Notes (Signed)
  Chaplain On-Call was paged by the patient's Nurse who reported that the patient and his daughter wanted a Will to be Notarized.  Chaplain viewed the document and saw that the Will is about the patient's personal finances and property.  Chaplain informed them that the hospital Notaries authorize documents related only to Health Care.  Chaplain suggested to patient's daughter that she inquire with the Hospice agency to whom the patient will be discharged.  [place] Adaleen Hulgan M.Div., [facility]

## 2021-01-24 NOTE — TOC Transition Note (Signed)
Transition of Care [facility]) - CM/SW Discharge Note   Patient Details  Name: Tyler Gallagher MRN: [id number] Date of Birth: November 30, 1961  Transition of Care [facility]) CM/SW Contact:  Shelbie Ammons, RN Phone Number: 01/24/2021, 3:19 PM   Clinical Narrative:   Patient will discharge home via [place] transport with Hospice services to start tomorrow. EMS paperwork completed and transport arranged.       Barriers to Discharge: No Barriers Identified   Patient Goals and CMS Choice Patient states their goals for this hospitalization and ongoing recovery are:: I want to go home      Discharge Placement                       Discharge Plan and Services                                     Social Determinants of Health (SDOH) Interventions     Readmission Risk Interventions Readmission Risk Prevention Plan 01/16/2021  Transportation Screening Complete  PCP or Specialist Appt within 3-5 Days Complete  HRI or [place] Complete  Social Work Consult for [place] Planning/Counseling Complete  Palliative Care Screening Not Applicable  Medication Review [employer]) Complete  Some recent data might be hidden

## 2021-01-24 NOTE — Discharge Instructions (Signed)
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 Patient is being discharged home with home hospice

## 2021-01-24 NOTE — Consult Note (Signed)
[place] Nurse Consult Note: Reason for Consult:Moisture associated skin damage(MASD)  to LUQ G tube site.   Leakage noted around tube.  SKin is denuded from contact with effluent and some medical adhesive related skin injury (MARSI) to wound periphery in window pane presentation.  Patient is transitioning to comfort care and end of life.  He would like G tube removed and surgery has been consulted for removal prior to discharge.  Site will continue to leak after tube removal and I will provide orders for now (G tube in place) and subsequent to removal of G tube. Patient likely to discharge to hospice house.  Wound type:MASD Pressure Injury POA: NA Measurement: denuded dry scabbed skin to G tube site periphery.  Thin yellow scabbing circumferential to G tube from contact with effluent.   Wound HUT:MLYYTKP dry and painful.  Drainage (amount, consistency, odor) moderate creamy effluent from G tube site.  Periwound:see above Dressing procedure/placement/frequency:While G tube in place:  Cleanse denuded skin, including around G tube with soap and water and pat dry. Apply silvadene cream to denuded skin.  Cut a slit in 4x5 piece Aquacel Ag and place around G tube (like a drain sponge).  Apply silvadene TID and change Aquacel Ag daily.  Once G tube is removed:  Cleanse denuded skin with soap and water and pat dry.  Cut a thin strip of Aquacel and place in G tube site opening.  Silvadene to denuded skin three times daily.  Send silvadene with patient at discharge.  Will not follow at this time.  Please re-consult if needed.  Domenic Moras MSN, RN, FNP-BC CWON Wound, Ostomy, Continence Nurse Pager [phone]

## 2021-01-24 NOTE — Progress Notes (Addendum)
[place] [facility]) Hospital Liaison RN note:  Received request from Altha Harm, NP and Jhonnie Garner, [facility] for hospice services at home after discharge. Chart and patient information under review by [facility] physician. Hospice eligibility has been approved.  Spoke with patient's significant other, Louretta Shorten, to initiate education related to hospice philosophy, services provided and to answer any questions. She verbalized understanding of information and had no questions. Plan is to discharge home by EMS once DME has been delivered.  DME needs were discussed and the following equipment has been ordered: Hospital bed, OBT, [place] chair. Address has been verified and is correct on face sheet. Contact is SO, Rosalyn at # [phone].  Please send signed and completed DNR home with patient. Please provide prescriptions at discharge as needed to ensure ongoing symptom management.  [place] Liaison contact information provided to SO. Above information shared with hospital care team.  Please call with any hospice related questions or concerns.  Thank you for the opportunity to participate in this patient's care.  Zandra Abts, RN [facility] Liaison [phone]

## 2021-01-24 NOTE — Discharge Summary (Signed)
Physician Discharge Summary  Tyler Gallagher WPY:[id number] DOB: [birth year] DOA: 01/20/2021  PCP: Juluis Pitch, MD  Admit date: 01/20/2021   Discharge date: 01/24/2021  Admitted From: Home.  Disposition:  Home with home hospice.  Recommendations for Outpatient Follow-up:  1. Follow up with PCP in 1-2 weeks. 2. Please obtain BMP/CBC in one week. 3. 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 4. Patient is being discharged home with home hospice.  Home Health: Yes Equipment/Devices:  Hospital bed.  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 Patient was discharged in stable condition and returns to ED this time with progressive generalized weakness, leakage from PEG tube.   Patient is admitted for possible occult infection, leakage from PEG tube.  General surgery, Gastroenterology and Oncology has been consulted. Patient was started on antibiotics.  General surgery has changed his PEG tube,  despite change patient continued to have slight leakage from his PEG tube.  Hematology oncology given his advanced disease has suggested comfort care.  Patient does not want to continue treatment and want to be comfortable.  GI signed off.  Palliative care consulted to discuss goals of care.  Antibiotics were discontinued.  Patient was started on pain medications.  Patient has agreed for comfort measures.  Patient is being discharged  home with home hospice.  He was managed for below problems.  Discharge Diagnoses:  Active Problems:   Failure to thrive in adult   Gastrostomy complication (HCC)  Leukocytosis : Improving  Differentials include possible Occult infection vs steroid induce leukocytosis.  Patient denies any fever, No focus of infection found.  Procalcitonin 0.10, lactic acid 3.1, blood cultures no growth so far. Continue broad spectrum antibiotics and de-escalate base on culture. Antibiotic discontinued.    Lactic acidosis : Resolved possibly related to occult infection but most likely due to malignancy /esp in setting of liver mets  Patient received IV fluids overnight.  Lactic acid improved to 1.7  Lung cancer metastasized to the brain and liver Patient follows up with oncology clinic outside. -on Keytruda -status post whole brain radiation -Last chemotherapy was in October 2021.  Hematology consulted, patient has poor prognosis. Recommended palliative care consult to discuss goals of care. Patient is agreeable to home hospice.  Failure to thrive: -in setting of progressive metastatic disease. will continue with tube feeds once Peg tube replaced. Palliative consult to discuss goals of care. Patient is agreeable to home hospice   PEG tube dysfunction : -of note peg tube in place,leaking brown  material, CT scan : no obvious dysplacement IR consulted for tube studies. IR not available on weekends. General surgery consulted. General surgery changed the PEG tube which remains in the stomach,  confirmed on x-ray. Tube feeds resumed but patient continues to leak.  Tube feeds discontinued.   Constipation : Continue Colace and Senokot.  Seizure d/o: Continue keppra, decadron. Seizure precautions.  DM II: Diet controlled: Regular insulin sliding scale.  HTN Continue metoprolol  Systolic heart failure.  -  LVEF 30-35% -no acute exacerbation   Social  -patient  would like palliative care and hospice consult.   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 Patient is being discharged home with home hospice   Discharge wound care:   Complete by: As directed    Outpatient follow-up   Increase activity slowly   Complete by: As directed      Allergies as of 01/24/2021      Reactions   Penicillins Other (See Comments)   "everything felt distant"      Medication List    TAKE these medications   dexamethasone 4 MG tablet Commonly known as: DECADRON Place 1 tablet (4 mg total) into feeding tube every 6 (six) hours.   dronabinol 5 MG capsule Commonly known as: MARINOL Take 1 capsule (5 mg total) by mouth 2 (two) times daily before a meal.   feeding supplement (OSMOLITE 1.5 CAL) Liqd Place 237 mLs into feeding tube every 3 (three) hours.   levETIRAcetam 500 MG tablet Commonly known as: KEPPRA Take 1 tablet (500 mg total) by mouth 2 (two) times daily.   metoprolol succinate 25 MG 24 hr tablet Commonly known as: TOPROL-XL Take 0.5 tablets (12.5 mg total) by mouth daily.   oxyCODONE 5 MG immediate release tablet Commonly known as: Roxicodone Take 1 tablet (5 mg total) by mouth every 8 (eight) hours as needed for up to 10 doses.   vitamin B-12 1000 MCG tablet Commonly known as: CYANOCOBALAMIN Take 1,000 mcg by mouth daily.            Discharge Care Instructions  (From  admission, onward)         Start     Ordered   01/24/21 0000  Discharge wound care:       Comments: Outpatient follow-up   01/24/21 1437          Follow-up Information    Juluis Pitch, MD Follow up in 1 week(s).   Specialty: Family Medicine Contact information: [place] [address] [phone]        Kate Sable, MD .   Specialties: Cardiology, Radiology Contact information: [address] [place] [address] [phone]              Allergies  Allergen Reactions  . Penicillins Other (See Comments)    "everything felt distant"    Consultations: General surgery Oncology  Procedures/Studies: DG Chest 2 View  Result Date: 01/20/2021 CLINICAL DATA:  Weakness with known history of lung carcinoma EXAM: CHEST - 2 VIEW COMPARISON:  01/11/2021 FINDINGS: Cardiac shadow is within normal limits. Right-sided chest wall port is seen. Lungs are hyperinflated consistent with COPD. Previously seen right lower lobe lung mass is not well appreciated on today's exam due to its proximity to the cardiac border  medially. IMPRESSION: COPD without acute abnormality. Electronically Signed   By: Inez Catalina M.D.   On: 01/20/2021 16:35   DG Chest 2 View  Result Date: 01/11/2021 CLINICAL DATA:  Tachycardia.  History of lung carcinoma EXAM: CHEST - 2 VIEW COMPARISON:  Chest radiograph August 18, 2020; chest CT November 08, 2020 FINDINGS: Port-A-Cath tip is in the superior vena cava. No pneumothorax. There is underlying emphysematous change with apparent bullous disease in the upper lobes. There is no edema or airspace opacity. The previously noted right lower lobe mass seen on most recent CT is not appreciable by radiography. Heart size is normal. Pulmonary vascularity reflects underlying emphysematous change. No adenopathy. No bone lesions. IMPRESSION: Port-A-Cath tip in superior vena cava. No pneumothorax. Underlying emphysematous change. No edema or  airspace opacity. Right lower lobe mass noted on previous CT not appreciable by radiography. Heart size within normal limits. No adenopathy appreciable by radiography. Emphysema (ICD10-J43.9). Electronically Signed   By: Lowella Grip III M.D.   On: 01/11/2021 08:27   DG Abd 1 View  Result Date: 01/22/2021 CLINICAL DATA:  G-tube placement. EXAM: ABDOMEN - 1 VIEW COMPARISON:  CT abdomen pelvis 01/20/2021 FINDINGS: Contrast material inserted through the G-tube. The oral contrast material appears to be located within the stomach without evidence for extravasation. Left lung bases clear. Gaseous distended loops of small bowel left hemiabdomen. IMPRESSION: G-tube appears to be located within the stomach. Electronically Signed   By: Lovey Newcomer M.D.   On: 01/22/2021 07:24   MR BRAIN W WO CONTRAST  Result Date: 01/06/2021 CLINICAL DATA:  Right-sided weakness, history of metastatic lung cancer with history of resection and whole brain radiation EXAM: MRI HEAD WITHOUT AND WITH CONTRAST TECHNIQUE: Multiplanar, multiecho pulse sequences of the brain and surrounding structures were obtained without and with intravenous contrast. CONTRAST:  57mL GADAVIST GADOBUTROL 1 MMOL/ML IV SOLN COMPARISON:  11/08/2020 FINDINGS: Brain: In the parasagittal left parietal lobe, there is a heterogeneously enhancing, partially necrotic lesion measuring 1.6 x 1.2 cm at the location of prior 0.6 cm lesion. There is extensive surrounding edema. Slightly increased enhancement in the parasagittal left frontal lobe at the inferior margin of the resection cavity. The punctate foci of enhancement in the left frontoparietal region on the prior study are not seen although there is greater motion artifact on this study. 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 There is no acute infarction. No hydrocephalus. No significant mass effect. Vascular: Major vessel flow voids at the skull base are preserved. Skull and upper cervical  spine: Left vertex craniotomy. Normal marrow signal is preserved. Sinuses/Orbits: Paranasal sinuses are aerated. Orbits are unremarkable. Other: Sella is unremarkable.  Mastoid air cells are clear. IMPRESSION: Increase in size and heterogeneity of parasagittal left parietal lesion with new extensive surrounding edema. Likely reflects progression of metastatic disease. However, given history of radiation, disproportionate edema, and appearance of enhancement, treatment effect is a consideration. Punctate foci of enhancement in the left frontoparietal region are no longer seen. Electronically Signed   By: Macy Mis M.D.   On: 01/06/2021 17:32   CT ABDOMEN PELVIS W CONTRAST  Result Date: 01/20/2021 CLINICAL DATA:  Generalized weakness, decreased bowel movements, leaking around feeding tube, history of metastatic small cell lung cancer EXAM: CT ABDOMEN AND PELVIS WITH CONTRAST TECHNIQUE: Multidetector CT imaging of the abdomen and pelvis was performed using the standard protocol following bolus administration of intravenous contrast. CONTRAST:  130mL OMNIPAQUE IOHEXOL 300 MG/ML  SOLN COMPARISON:  11/08/2020  FINDINGS: Lower chest: Partial visualization of known right lower lobe malignancy, measuring approximately 1.6 x 1.4 cm image 1 of series 2. No airspace disease, effusion, or pneumothorax. Hepatobiliary: Since the previous exam, marked progression of known intrahepatic metastases. Multiple large confluent lesions are seen throughout the liver, largest in the left lobe measuring 7.8 x 5.7 cm reference image 8 of series 2. This measures approximately 1.3 cm on previous exam. Multiple gallstones are identified without cholecystitis. No biliary dilation. Pancreas: Unremarkable. No pancreatic ductal dilatation or surrounding inflammatory changes. Spleen: Enlarging hypodensity ventral aspect of the spleen measuring 2.7 x 2.2 cm image 23 of series 2 consistent with progressive metastatic disease. Adrenals/Urinary  Tract: Stable left renal cyst. The kidneys enhance normally and symmetrically. No urinary tract calculi or obstruction. Bladder and adrenals are unremarkable. Stomach/Bowel: No evidence of high-grade bowel obstruction or ileus. Percutaneous gastrostomy tube is seen within the gastric lumen. There is gas dissecting along the catheter tubing within the abdominal wall. No fluid collection. There is a segment of jejunum with significant wall thickening measuring up to 1.9 cm, image 42 series 2 within the left upper quadrant, suspicious for metastatic disease. Vascular/Lymphatic: Multiple enlarged lymph nodes are seen within the central mesentery, largest measuring 2.7 by 3.5 cm image 41 of series 2, consistent with metastatic disease. Stable atherosclerosis of the abdominal aorta without aneurysm or dissection. Reproductive: Prostate is unremarkable. Other: No free fluid or free intraperitoneal gas. No abdominal wall hernia. Musculoskeletal: Metastatic lesion within the abdominal wall musculature of the right flank measuring 2.4 cm on image 38 of series 2. There is a lytic lesion within the left iliac bone abutting the anterior margin of the sacroiliac joint. Large lytic lesion partially visualized within the proximal left femoral diaphysis. These are consistent with metastatic disease. The left femoral lesion is at risk for fracture. IMPRESSION: 1. Marked progression of metastatic disease. 2. Enlarging masses throughout the liver and spleen. 3. Metastatic adenopathy within the central mesentery and retroperitoneum. 4. Small bowel metastasis involving the jejunum within the left upper quadrant, without high-grade obstruction. 5. Bony metastatic disease as above, with large lytic lesion involving the proximal left femoral diaphysis only partially imaged on this exam. Given the degree of lucency and location, this is at risk for fracture. 6. Partially visualized right lower lobe mass consistent with primary malignancy. 7.  Indwelling PEG tube. There is gas along the catheter tubing, without evidence of fluid collection or abscess. Electronically Signed   By: Randa Ngo M.D.   On: 01/20/2021 20:29   CT HEAD CODE STROKE WO CONTRAST  Result Date: 01/06/2021 CLINICAL DATA:  Code stroke.  Neuro deficit, acute stroke suspected. EXAM: CT HEAD WITHOUT CONTRAST TECHNIQUE: Contiguous axial images were obtained from the base of the skull through the vertex without intravenous contrast. COMPARISON:  MRI November 08, 2020.  CT head June 21, 2020. FINDINGS: Brain: There is new exuberant vasogenic edema in the high left parietal lobe with suspected mass in this region (see series 3, image 24). Local sulcal effacement without midline shift. Small area of encephalomalacia in the high right parafalcine region, compatible with sequela of prior metastatic lesion resection. Remote right frontal infarct with encephalomalacia. No evidence of acute large vascular territory infarct. No acute hemorrhage. No hydrocephalus. Additional patchy white matter hypoattenuation, most likely related to chronic microvascular ischemic disease Vascular: Calcific atherosclerosis. Skull: No acute fracture. Sinuses/Orbits: Mild paranasal sinus mucosal thickening with polyp versus retention cyst in the right maxillary sinus. Other: Small left mastoid  effusion. IMPRESSION: New vasogenic edema in the high left parietal lobe with suspected mass in this region, concerning for progression of the patient's known metastatic disease. Local sulcal effacement without midline shift. Recommend MRI with contrast to further evaluate. Findings discussed with Dr. Milas Gain via telephone at 2:46 p.m. Electronically Signed   By: Margaretha Sheffield MD   On: 01/06/2021 14:49       Subjective: Patient was seen and examined at bedside.  Overnight events noted. Patient seems fine,  Patient wants to be discharged home with home hospice. Patient wants to be comfortable and wants to be at his home  as soon as possible.  Discharge Exam: Vitals:   01/24/21 0807 01/24/21 1115  BP: 111/83 109/72  Pulse: 91 81  Resp: 18 17  Temp: 98.4 F (36.9 C) (!) 97.5 F (36.4 C)  SpO2: 97% 95%   Vitals:   01/23/21 2305 01/24/21 0428 01/24/21 0807 01/24/21 1115  BP: 101/77 138/89 111/83 109/72  Pulse: 77 79 91 81  Resp: 18 18 18 17   Temp: 97.9 F (36.6 C) 97.7 F (36.5 C) 98.4 F (36.9 C) (!) 97.5 F (36.4 C)  TempSrc:   Oral Oral  SpO2: 95% 94% 97% 95%  Weight:      Height:        General: Pt is alert, awake, not in acute distress Cardiovascular: RRR, S1/S2 +, no rubs, no gallops Respiratory: CTA bilaterally, no wheezing, no rhonchi Abdominal: Soft, NT, ND, bowel sounds + PEG tube noted,  slight excoriation around the tube Extremities: no edema, no cyanosis    The results of significant diagnostics from this hospitalization (including imaging, microbiology, ancillary and laboratory) are listed below for reference.     Microbiology: Recent Results (from the past 240 hour(s))  Culture, blood (single)     Status: None (Preliminary result)   Collection Time: 01/20/21  9:04 PM   Specimen: BLOOD  Result Value Ref Range Status   Specimen Description BLOOD BLOOD RIGHT FOREARM  Final   Special Requests   Final    BOTTLES DRAWN AEROBIC AND ANAEROBIC Blood Culture adequate volume   Culture   Final    NO GROWTH 4 DAYS Performed at [facility], [address] [place], [place] [address]    Report Status PENDING  Incomplete  SARS CORONAVIRUS 2 (TAT 6-24 HRS) Nasopharyngeal Nasopharyngeal Swab     Status: None   Collection Time: 01/21/21 11:00 PM   Specimen: Nasopharyngeal Swab  Result Value Ref Range Status   SARS Coronavirus 2 NEGATIVE NEGATIVE Final    Comment: (NOTE) SARS-CoV-2 target nucleic acids are NOT DETECTED.  The SARS-CoV-2 RNA is generally detectable in upper and lower respiratory specimens during the acute phase of infection. Negative results do not  preclude SARS-CoV-2 infection, do not rule out co-infections with other pathogens, and should not be used as the sole basis for treatment or other patient management decisions. Negative results must be combined with clinical observations, patient history, and epidemiological information. The expected result is Negative.  Fact Sheet for Patients: SugarRoll.be  Fact Sheet for Healthcare Providers: https://www.woods-mathews.com/  This test is not yet approved or cleared by the [place] FDA and  has been authorized for detection and/or diagnosis of SARS-CoV-2 by FDA under an Emergency Use Authorization (EUA). This EUA will remain  in effect (meaning this test can be used) for the duration of the COVID-19 declaration under Se ction 564(b)(1) of the Act, 21 U.S.C. section 360bbb-3(b)(1), unless the authorization is  terminated or revoked sooner.  Performed at [facility], [place] [address] [place], [place] [address]      Labs: BNP (last 3 results) Recent Labs    11/08/20 0432 01/11/21 0659  BNP 42.3 50.2   Basic Metabolic Panel: Recent Labs  Lab 01/20/21 1320 01/21/21 0452 01/22/21 0530 01/23/21 0402 01/24/21 0850  NA 133* 137 137 135 136  K 4.1 3.5 4.0 3.9 3.6  CL 94* 97* 97* 98 100  CO2 25 28 29 28 26   GLUCOSE 169* 90 91 129* 86  BUN 15 16 12 17 20   CREATININE 0.42* 0.41* 0.36* 0.36* 0.34*  CALCIUM 9.2 8.5* 8.2* 8.1* 8.3*  MG  --   --  1.9 2.2 2.3  PHOS  --   --  4.1 3.1 2.3*   Liver Function Tests: Recent Labs  Lab 01/21/21 0452 01/22/21 0530 01/24/21 0850  AST 23 19 18   ALT 64* 43 28  ALKPHOS 215* 169* 155*  BILITOT 0.9 1.1 1.3*  PROT 5.4* 5.1* 5.4*  ALBUMIN 2.4* 2.2* 2.2*   No results for input(s): LIPASE, AMYLASE in the last 168 hours. No results for input(s): AMMONIA in the last 168 hours. CBC: Recent Labs  Lab 01/20/21 1320 01/21/21 0158 01/21/21 0452 01/21/21 0858 01/22/21 0530  01/23/21 0402 01/24/21 0850  WBC 24.3*  --  18.6*  --  16.4* 14.5* 14.7*  HGB 12.9*   < > 11.5* 12.3* 10.2* 10.5* 11.7*  HCT 39.5  --  35.5*  --  31.1* 31.8* 35.2*  MCV 91.9  --  91.7  --  92.8 91.1 91.4  PLT 253  --  171  --  180 179 190   < > = values in this interval not displayed.   Cardiac Enzymes: No results for input(s): CKTOTAL, CKMB, CKMBINDEX, TROPONINI in the last 168 hours. BNP: Invalid input(s): POCBNP CBG: Recent Labs  Lab 01/21/21 0900  GLUCAP 78   D-Dimer No results for input(s): DDIMER in the last 72 hours. Hgb A1c No results for input(s): HGBA1C in the last 72 hours. Lipid Profile No results for input(s): CHOL, HDL, LDLCALC, TRIG, CHOLHDL, LDLDIRECT in the last 72 hours. Thyroid function studies No results for input(s): TSH, T4TOTAL, T3FREE, THYROIDAB in the last 72 hours.  Invalid input(s): FREET3 Anemia work up No results for input(s): VITAMINB12, FOLATE, FERRITIN, TIBC, IRON, RETICCTPCT in the last 72 hours. Urinalysis    Component Value Date/Time   COLORURINE YELLOW (A) 01/20/2021 1737   APPEARANCEUR CLEAR (A) 01/20/2021 1737   APPEARANCEUR Clear 07/13/2020 0846   LABSPEC >1.046 (H) 01/20/2021 1737   PHURINE 7.0 01/20/2021 1737   GLUCOSEU NEGATIVE 01/20/2021 1737   HGBUR NEGATIVE 01/20/2021 1737   BILIRUBINUR NEGATIVE 01/20/2021 1737   BILIRUBINUR Negative 07/13/2020 0846   KETONESUR NEGATIVE 01/20/2021 1737   PROTEINUR NEGATIVE 01/20/2021 1737   NITRITE NEGATIVE 01/20/2021 1737   LEUKOCYTESUR NEGATIVE 01/20/2021 1737   Sepsis Labs Invalid input(s): PROCALCITONIN,  WBC,  LACTICIDVEN Microbiology Recent Results (from the past 240 hour(s))  Culture, blood (single)     Status: None (Preliminary result)   Collection Time: 01/20/21  9:04 PM   Specimen: BLOOD  Result Value Ref Range Status   Specimen Description BLOOD BLOOD RIGHT FOREARM  Final   Special Requests   Final    BOTTLES DRAWN AEROBIC AND ANAEROBIC Blood Culture adequate volume    Culture   Final    NO GROWTH 4 DAYS Performed at [facility], [address] [place], [place] [address]    Report Status PENDING  Incomplete  SARS CORONAVIRUS 2 (TAT 6-24 HRS) Nasopharyngeal Nasopharyngeal Swab     Status: None   Collection Time: 01/21/21 11:00 PM   Specimen: Nasopharyngeal Swab  Result Value Ref Range Status   SARS Coronavirus 2 NEGATIVE NEGATIVE Final    Comment: (NOTE) SARS-CoV-2 target nucleic acids are NOT DETECTED.  The SARS-CoV-2 RNA is generally detectable in upper and lower respiratory specimens during the acute phase of infection. Negative results do not preclude SARS-CoV-2 infection, do not rule out co-infections with other pathogens, and should not be used as the sole basis for treatment or other patient management decisions. Negative results must be combined with clinical observations, patient history, and epidemiological information. The expected result is Negative.  Fact Sheet for Patients: SugarRoll.be  Fact Sheet for Healthcare Providers: https://www.woods-mathews.com/  This test is not yet approved or cleared by the [place] FDA and  has been authorized for detection and/or diagnosis of SARS-CoV-2 by FDA under an Emergency Use Authorization (EUA). This EUA will remain  in effect (meaning this test can be used) for the duration of the COVID-19 declaration under Se ction 564(b)(1) of the Act, 21 U.S.C. section 360bbb-3(b)(1), unless the authorization is terminated or revoked sooner.  Performed at [facility], [place] [address], [place], [place] [address]      Time coordinating discharge: Over 30 minutes  SIGNED:   Shawna Clamp, MD  [facility] 01/24/2021, 2:38 PM Pager   If 7PM-7AM, please contact night-coverage [email or web address]

## 2021-01-24 NOTE — Progress Notes (Signed)
[place]  [phone] [phone]   Name: Tyler Gallagher Date: 01/24/2021 MRN: [id number]  DOB: Aug 29, 1961  Patient Care Team: Juluis Pitch, MD as PCP - General (Family Medicine) Kate Sable, MD as PCP - Cardiology (Cardiology) Telford Nab, RN as Oncology Nurse Navigator Vladimir Crofts, MD as Consulting Physician (Neurology)    REASON FOR CONSULTATION: Tyler Gallagher is a 60 y.o. 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  Patient was hospitalized 01/11/2021-01/17/2021 with recurrent seizures and failure to thrive, for which he underwent PEG placement.  Patient is now readmitted 01/20/2021 with weakness, failure to thrive, and leakage surrounding his PEG.  CT of abdomen/pelvis revealed interval significant worsening of his cancer with enlarging masses in his liver and spleen, small bowel metastasis, bony metastatic disease, and metastatic adenopathy. He was referred to palliative care to help address goals and manage ongoing symptoms.   CODE STATUS: DNR  PAST MEDICAL HISTORY: Past Medical History:  Diagnosis Date  . Depression   . Diabetes mellitus without complication ([place])   . Hyperlipidemia   . Hypertension   . Lesion of brain   . Lung cancer ([place])   . Mass of lung   . Seizures ([place])     PAST SURGICAL HISTORY:  Past Surgical History:  Procedure Laterality Date  . 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 COLONOSCOPY    . CYSTOSCOPY W/ RETROGRADES Bilateral 07/14/2020   Procedure: CYSTOSCOPY WITH RETROGRADE PYELOGRAM;  Surgeon: Hollice Espy, MD;  Location: [facility];  Service: Urology;  Laterality: Bilateral;  . PORTA CATH INSERTION N/A 08/03/2020   Procedure: PORTA CATH INSERTION;  Surgeon: Katha Cabal, MD;   Location: [facility];  Service: Cardiovascular;  Laterality: N/A;  . PORTA CATH INSERTION N/A 10/03/2020   Procedure: PORTA CATH INSERTION;  Surgeon: Algernon Huxley, MD;  Location: [facility];  Service: Cardiovascular;  Laterality: N/A;  . VIDEO BRONCHOSCOPY WITH ENDOBRONCHIAL NAVIGATION N/A 07/01/2020   Procedure: VIDEO BRONCHOSCOPY WITH ENDOBRONCHIAL NAVIGATION;  Surgeon: [name], MD;  Location: [facility];  Service: Thoracic;  Laterality: N/A;  . VIDEO BRONCHOSCOPY WITH ENDOBRONCHIAL ULTRASOUND N/A 07/01/2020   Procedure: VIDEO BRONCHOSCOPY WITH ENDOBRONCHIAL ULTRASOUND;  Surgeon: [name], MD;  Location: [facility];  Service: Thoracic;  Laterality: N/A;    HEMATOLOGY/ONCOLOGY HISTORY:  Oncology History  Malignant neoplasm of lower lobe of right lung ([place])  07/07/2020 Cancer Staging   Staging form: Lung, AJCC 8th Edition - Clinical stage from 07/07/2020: Stage IVA (cT2a, cN0, cM1b) - Signed by [name], MD on 07/08/2020   07/08/2020 Initial Diagnosis   Malignant neoplasm of lower lobe of right lung ([place])   08/15/2020 - 10/20/2020 Chemotherapy   The patient had dexamethasone (DECADRON) 4 MG tablet, 8 mg, Oral, Daily, 1 of 1 cycle, Start date: 07/26/2020, End date: 08/15/2020 palonosetron (ALOXI) injection 0.25 mg, 0.25 mg, Intravenous,  Once, 7 of 7 cycles Administration: 0.25 mg (08/15/2020), 0.25 mg (09/22/2020), 0.25 mg (09/29/2020), 0.25 mg (09/15/2020), 0.25 mg (10/06/2020), 0.25 mg (10/13/2020), 0.25 mg (10/20/2020) CARBOplatin (PARAPLATIN) 300 mg in sodium chloride 0.9 % 250 mL chemo infusion, 300 mg (100 % of original dose 300 mg), Intravenous,  Once, 7 of 7 cycles Dose modification:   (original dose 300 mg, Cycle 1)  Administration: 300 mg (09/22/2020), 300 mg (09/29/2020), 300 mg (09/15/2020), 300 mg (10/06/2020), 300 mg (10/13/2020), 300 mg (10/20/2020) PACLitaxel (TAXOL) 186 mg in sodium chloride 0.9 % 250 mL chemo infusion (</= 80mg /m2), 80 mg/m2 = 186 mg,  Intravenous,  Once, 7 of 7 cycles Dose modification: 65 mg/m2 (original dose 80 mg/m2, Cycle 5, Reason: Dose not tolerated) Administration: 186 mg (09/22/2020), 186 mg (09/29/2020), 186 mg (09/15/2020), 150 mg (10/06/2020), 150 mg (10/13/2020), 150 mg (10/20/2020)  for chemotherapy treatment.    12/15/2020 -  Chemotherapy    Patient is on Treatment Plan: LUNG NSCLC FLAT DOSE PEMBROLIZUMAB Q21D        ALLERGIES:  is allergic to penicillins.  MEDICATIONS:  Current Facility-Administered Medications  Medication Dose Route Frequency Provider Last Rate Last Admin  . 0.9 %  sodium chloride infusion   Intravenous PRN Shawna Clamp, MD   Stopped at 01/23/21 [phone]  . acetaminophen (TYLENOL) tablet 650 mg  650 mg Per Tube Q6H PRN Shawna Clamp, MD       Or  . acetaminophen (TYLENOL) suppository 650 mg  650 mg Rectal Q6H PRN Shawna Clamp, MD      . ceFEPIme (MAXIPIME) 2 g in sodium chloride 0.9 % 100 mL IVPB  2 g Intravenous Q8H Hall, Scott A, RPH 200 mL/hr at 01/24/21 0409 2 g at 01/24/21 0409  . Chlorhexidine Gluconate Cloth 2 % PADS 6 each  6 each Topical Daily Shawna Clamp, MD   6 each at 01/23/21 1211  . dexamethasone (DECADRON) injection 4 mg  4 mg Intravenous Q6H Lang Snow, NP   4 mg at 01/24/21 0044  . dronabinol (MARINOL) capsule 5 mg  5 mg Oral BID AC Myles Rosenthal A, MD   5 mg at 01/24/21 0947  . HYDROmorphone (DILAUDID) injection 1 mg  1 mg Intravenous Q3H PRN Jules Husbands, MD   1 mg at 01/24/21 0948  . iohexol (OMNIPAQUE) 9 MG/ML oral solution 1,000 mL  1,000 mL Oral Once PRN Vladimir Crofts, MD      . levETIRAcetam (KEPPRA) 100 MG/ML solution 500 mg  500 mg Per Tube BID Shawna Clamp, MD   500 mg at 01/23/21 2153  . liver oil-zinc oxide (DESITIN) 40 % ointment   Topical Daily [place], Isami, DO   Given at 01/23/21 1033  . metoprolol succinate (TOPROL-XL) 24 hr tablet 12.5 mg  12.5 mg Oral Daily Myles Rosenthal A, MD   12.5 mg at 01/22/21 1004  . morphine 4 MG/ML  injection 4 mg  4 mg Intravenous Q2H PRN Millisa Giarrusso, Kirt Boys, NP   4 mg at 01/23/21 1418  . ondansetron (ZOFRAN) tablet 4 mg  4 mg Per Tube Q6H PRN Shawna Clamp, MD       Or  . ondansetron (ZOFRAN) injection 4 mg  4 mg Intravenous Q6H PRN Shawna Clamp, MD      . polyethylene glycol (MIRALAX / GLYCOLAX) packet 17 g  17 g Per Tube Daily PRN Shawna Clamp, MD      . silver sulfADIAZINE (SILVADENE) 1 % cream   Topical TID Shawna Clamp, MD      . vancomycin (VANCOCIN) IVPB 1000 mg/200 mL premix  1,000 mg Intravenous Q12H Shawna Clamp, MD 200 mL/hr at 01/23/21 2320 1,000 mg at 01/23/21 2320  . vitamin B-12 (CYANOCOBALAMIN) tablet 1,000 mcg  1,000 mcg Per Tube Daily Shawna Clamp, MD       Facility-Administered Medications Ordered in Other Encounters  Medication Dose Route Frequency Provider Last  Rate Last Admin  . heparin lock flush 100 unit/mL  500 Units Intravenous Once Charlaine Dalton R, MD      . sodium chloride flush (NS) 0.9 % injection 10 mL  10 mL Intravenous Once Cammie Sickle, MD        VITAL SIGNS: BP 111/83 (BP Location: Left Arm)   Pulse 91   Temp 98.4 F (36.9 C) (Oral)   Resp 18   Ht 6\' 4"  (1.93 m)   Wt 170 lb 10.2 oz (77.4 kg)   SpO2 97%   BMI 20.77 kg/m  Filed Weights   01/20/21 1313 01/23/21 0046 01/23/21 0408  Weight: 160 lb (72.6 kg) 169 lb 8.5 oz (76.9 kg) 170 lb 10.2 oz (77.4 kg)    Estimated body mass index is 20.77 kg/m as calculated from the following:   Height as of this encounter: 6\' 4"  (1.93 m).   Weight as of this encounter: 170 lb 10.2 oz (77.4 kg).  LABS: CBC:    Component Value Date/Time   WBC 14.7 (H) 01/24/2021 0850   HGB 11.7 (L) 01/24/2021 0850   HCT 35.2 (L) 01/24/2021 0850   PLT 190 01/24/2021 0850   MCV 91.4 01/24/2021 0850   NEUTROABS 13.2 (H) 01/11/2021 0659   LYMPHSABS 0.7 01/11/2021 0659   MONOABS 1.0 01/11/2021 0659   EOSABS 0.5 01/11/2021 0659   BASOSABS 0.0 01/11/2021 0659   Comprehensive Metabolic Panel:     Component Value Date/Time   NA 136 01/24/2021 0850   K 3.6 01/24/2021 0850   CL 100 01/24/2021 0850   CO2 26 01/24/2021 0850   BUN 20 01/24/2021 0850   CREATININE 0.34 (L) 01/24/2021 0850   GLUCOSE 86 01/24/2021 0850   CALCIUM 8.3 (L) 01/24/2021 0850   AST 18 01/24/2021 0850   ALT 28 01/24/2021 0850   ALKPHOS 155 (H) 01/24/2021 0850   BILITOT 1.3 (H) 01/24/2021 0850   PROT 5.4 (L) 01/24/2021 0850   ALBUMIN 2.2 (L) 01/24/2021 0850    RADIOGRAPHIC STUDIES: DG Chest 2 View  Result Date: 01/20/2021 CLINICAL DATA:  Weakness with known history of lung carcinoma EXAM: CHEST - 2 VIEW COMPARISON:  01/11/2021 FINDINGS: Cardiac shadow is within normal limits. Right-sided chest wall port is seen. Lungs are hyperinflated consistent with COPD. Previously seen right lower lobe lung mass is not well appreciated on today's exam due to its proximity to the cardiac border medially. IMPRESSION: COPD without acute abnormality. Electronically Signed   By: Inez Catalina M.D.   On: 01/20/2021 16:35   DG Chest 2 View  Result Date: 01/11/2021 CLINICAL DATA:  Tachycardia.  History of lung carcinoma EXAM: CHEST - 2 VIEW COMPARISON:  Chest radiograph August 18, 2020; chest CT November 08, 2020 FINDINGS: Port-A-Cath tip is in the superior vena cava. No pneumothorax. There is underlying emphysematous change with apparent bullous disease in the upper lobes. There is no edema or airspace opacity. The previously noted right lower lobe mass seen on most recent CT is not appreciable by radiography. Heart size is normal. Pulmonary vascularity reflects underlying emphysematous change. No adenopathy. No bone lesions. IMPRESSION: Port-A-Cath tip in superior vena cava. No pneumothorax. Underlying emphysematous change. No edema or airspace opacity. Right lower lobe mass noted on previous CT not appreciable by radiography. Heart size within normal limits. No adenopathy appreciable by radiography. Emphysema (ICD10-J43.9).  Electronically Signed   By: Lowella Grip III M.D.   On: 01/11/2021 08:27   DG Abd 1 View  Result Date: 01/22/2021  CLINICAL DATA:  G-tube placement. EXAM: ABDOMEN - 1 VIEW COMPARISON:  CT abdomen pelvis 01/20/2021 FINDINGS: Contrast material inserted through the G-tube. The oral contrast material appears to be located within the stomach without evidence for extravasation. Left lung bases clear. Gaseous distended loops of small bowel left hemiabdomen. IMPRESSION: G-tube appears to be located within the stomach. Electronically Signed   By: Lovey Newcomer M.D.   On: 01/22/2021 07:24   MR BRAIN W WO CONTRAST  Result Date: 01/06/2021 CLINICAL DATA:  Right-sided weakness, history of metastatic lung cancer with history of resection and whole brain radiation EXAM: MRI HEAD WITHOUT AND WITH CONTRAST TECHNIQUE: Multiplanar, multiecho pulse sequences of the brain and surrounding structures were obtained without and with intravenous contrast. CONTRAST:  51mL GADAVIST GADOBUTROL 1 MMOL/ML IV SOLN COMPARISON:  11/08/2020 FINDINGS: Brain: In the parasagittal left parietal lobe, there is a heterogeneously enhancing, partially necrotic lesion measuring 1.6 x 1.2 cm at the location of prior 0.6 cm lesion. There is extensive surrounding edema. Slightly increased enhancement in the parasagittal left frontal lobe at the inferior margin of the resection cavity. The punctate foci of enhancement in the left frontoparietal region on the prior study are not seen although there is greater motion artifact on this study. 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 There is no acute infarction. No hydrocephalus. No significant mass effect. Vascular: Major vessel flow voids at the skull base are preserved. Skull and upper cervical spine: Left vertex craniotomy. Normal marrow signal is preserved. Sinuses/Orbits: Paranasal sinuses are aerated. Orbits are unremarkable. Other: Sella is unremarkable.  Mastoid air cells are  clear. IMPRESSION: Increase in size and heterogeneity of parasagittal left parietal lesion with new extensive surrounding edema. Likely reflects progression of metastatic disease. However, given history of radiation, disproportionate edema, and appearance of enhancement, treatment effect is a consideration. Punctate foci of enhancement in the left frontoparietal region are no longer seen. Electronically Signed   By: Macy Mis M.D.   On: 01/06/2021 17:32   CT ABDOMEN PELVIS W CONTRAST  Result Date: 01/20/2021 CLINICAL DATA:  Generalized weakness, decreased bowel movements, leaking around feeding tube, history of metastatic small cell lung cancer EXAM: CT ABDOMEN AND PELVIS WITH CONTRAST TECHNIQUE: Multidetector CT imaging of the abdomen and pelvis was performed using the standard protocol following bolus administration of intravenous contrast. CONTRAST:  132mL OMNIPAQUE IOHEXOL 300 MG/ML  SOLN COMPARISON:  11/08/2020 FINDINGS: Lower chest: Partial visualization of known right lower lobe malignancy, measuring approximately 1.6 x 1.4 cm image 1 of series 2. No airspace disease, effusion, or pneumothorax. Hepatobiliary: Since the previous exam, marked progression of known intrahepatic metastases. Multiple large confluent lesions are seen throughout the liver, largest in the left lobe measuring 7.8 x 5.7 cm reference image 8 of series 2. This measures approximately 1.3 cm on previous exam. Multiple gallstones are identified without cholecystitis. No biliary dilation. Pancreas: Unremarkable. No pancreatic ductal dilatation or surrounding inflammatory changes. Spleen: Enlarging hypodensity ventral aspect of the spleen measuring 2.7 x 2.2 cm image 23 of series 2 consistent with progressive metastatic disease. Adrenals/Urinary Tract: Stable left renal cyst. The kidneys enhance normally and symmetrically. No urinary tract calculi or obstruction. Bladder and adrenals are unremarkable. Stomach/Bowel: No evidence of  high-grade bowel obstruction or ileus. Percutaneous gastrostomy tube is seen within the gastric lumen. There is gas dissecting along the catheter tubing within the abdominal wall. No fluid collection. There is a segment of jejunum with significant wall thickening measuring up to 1.9 cm,  image 42 series 2 within the left upper quadrant, suspicious for metastatic disease. Vascular/Lymphatic: Multiple enlarged lymph nodes are seen within the central mesentery, largest measuring 2.7 by 3.5 cm image 41 of series 2, consistent with metastatic disease. Stable atherosclerosis of the abdominal aorta without aneurysm or dissection. Reproductive: Prostate is unremarkable. Other: No free fluid or free intraperitoneal gas. No abdominal wall hernia. Musculoskeletal: Metastatic lesion within the abdominal wall musculature of the right flank measuring 2.4 cm on image 38 of series 2. There is a lytic lesion within the left iliac bone abutting the anterior margin of the sacroiliac joint. Large lytic lesion partially visualized within the proximal left femoral diaphysis. These are consistent with metastatic disease. The left femoral lesion is at risk for fracture. IMPRESSION: 1. Marked progression of metastatic disease. 2. Enlarging masses throughout the liver and spleen. 3. Metastatic adenopathy within the central mesentery and retroperitoneum. 4. Small bowel metastasis involving the jejunum within the left upper quadrant, without high-grade obstruction. 5. Bony metastatic disease as above, with large lytic lesion involving the proximal left femoral diaphysis only partially imaged on this exam. Given the degree of lucency and location, this is at risk for fracture. 6. Partially visualized right lower lobe mass consistent with primary malignancy. 7. Indwelling PEG tube. There is gas along the catheter tubing, without evidence of fluid collection or abscess. Electronically Signed   By: Randa Ngo M.D.   On: 01/20/2021 20:29   CT  HEAD CODE STROKE WO CONTRAST  Result Date: 01/06/2021 CLINICAL DATA:  Code stroke.  Neuro deficit, acute stroke suspected. EXAM: CT HEAD WITHOUT CONTRAST TECHNIQUE: Contiguous axial images were obtained from the base of the skull through the vertex without intravenous contrast. COMPARISON:  MRI November 08, 2020.  CT head June 21, 2020. FINDINGS: Brain: There is new exuberant vasogenic edema in the high left parietal lobe with suspected mass in this region (see series 3, image 24). Local sulcal effacement without midline shift. Small area of encephalomalacia in the high right parafalcine region, compatible with sequela of prior metastatic lesion resection. Remote right frontal infarct with encephalomalacia. No evidence of acute large vascular territory infarct. No acute hemorrhage. No hydrocephalus. Additional patchy white matter hypoattenuation, most likely related to chronic microvascular ischemic disease Vascular: Calcific atherosclerosis. Skull: No acute fracture. Sinuses/Orbits: Mild paranasal sinus mucosal thickening with polyp versus retention cyst in the right maxillary sinus. Other: Small left mastoid effusion. IMPRESSION: New vasogenic edema in the high left parietal lobe with suspected mass in this region, concerning for progression of the patient's known metastatic disease. Local sulcal effacement without midline shift. Recommend MRI with contrast to further evaluate. Findings discussed with Dr. Milas Gain via telephone at 2:46 p.m. Electronically Signed   By: Margaretha Sheffield MD   On: 01/06/2021 14:49    PERFORMANCE STATUS (ECOG) : 3 - Symptomatic, >50% confined to bed  Review of Systems Unless otherwise noted, a complete review of systems is negative.  Physical Exam General: NAD Pulmonary: Unlabored Abdomen: PEG with surrounding excoriation GU: no suprapubic tenderness Extremities: Left AKA Skin: no rashes Neurological: Weakness but otherwise nonfocal  IMPRESSION: Follow-up visit.  Patient  was seen yesterday by surgeon and felt that removal of the PEG would likely worsen leakage.  Patient would still prefer to have PEG removed if it were an option but seems more resigned today to what ever the outcome.   Symptomatically, patient continues to endorse 10 out of 10 abdominal/PEG pain.  He describes esophageal burning with drinking anything  other than water.  He was rotated to hydromorphone yesterday and has received a total of 3 mg IV in past 24 hours.  He feels that the hydromorphone is more effective than IV morphine but still continues to endorse severe and persistent pain.   Plan is comfort care discharge home with hospice.     We will start patient on transdermal fentanyl for long-acting pain control.  Can give him hydromorphone elixir at home for breakthrough pain.  If that fails, would have low threshold for starting him on a hydromorphone PCA at home.  PLAN: -Comfort care -Home with hospice -Start transdermal fentanyl 25 mcg every 72 hours -Liberalize IV hydromorphone every 2 hours as needed and also order hydromorphone elixir p.o. as needed for breakthrough pain anticipation of discharge -Start PPI daily  Case and plan discussed with [name] and Dr. Dwyane Dee   Time Total: 30 minutes  Visit consisted of counseling and education dealing with the complex and emotionally intense issues of symptom management and palliative care in the setting of serious and potentially life-threatening illness.Greater than 50%  of this time was spent counseling and coordinating care related to the above assessment and plan.  Signed by: Altha Harm, PhD, NP-C

## 2021-01-25 ENCOUNTER — Telehealth: Payer: Self-pay | Admitting: Hospice and Palliative Medicine

## 2021-01-25 ENCOUNTER — Other Ambulatory Visit: Payer: Self-pay | Admitting: Hospice and Palliative Medicine

## 2021-01-25 ENCOUNTER — Ambulatory Visit: Admission: RE | Admit: 2021-01-25 | Payer: Medicare HMO | Source: Ambulatory Visit

## 2021-01-25 ENCOUNTER — Telehealth: Payer: Self-pay | Admitting: *Deleted

## 2021-01-25 DIAGNOSIS — C3431 Malignant neoplasm of lower lobe, right bronchus or lung: Secondary | ICD-10-CM | POA: Diagnosis not present

## 2021-01-25 DIAGNOSIS — R634 Abnormal weight loss: Secondary | ICD-10-CM | POA: Diagnosis not present

## 2021-01-25 LAB — CULTURE, BLOOD (SINGLE)
Culture: NO GROWTH
Special Requests: ADEQUATE

## 2021-01-25 MED ORDER — OXYCODONE HCL 5 MG PO TABS: 5.0000 mg | ORAL_TABLET | ORAL | 0 refills | Status: DC | PRN

## 2021-01-25 MED ORDER — FENTANYL 25 MCG/HR TD PT72: 1.0000 | MEDICATED_PATCH | TRANSDERMAL | 0 refills | Status: DC

## 2021-01-25 NOTE — Telephone Encounter (Signed)
I called and spoke with patient's significant other, Roslyn.  She reports that patient feels a little better this morning.  He was able to tolerate eating some toast and drinking some fluids.  She says that she has been giving him oxycodone for pain and that that is helping.  Patient also has a transdermal fentanyl patch that was started yesterday.  She reports overall the pain is stable this morning.  Patient continues to have leakage from PEG but family are using a towel to absorb most of it and has applied skin barrier cream.  Hospice will be coming out later this morning to initiate services.  I also called the hospice team and spoke with nurse in anticipation of their visit.

## 2021-01-25 NOTE — Telephone Encounter (Signed)
Elizabeth with [facility] called stating that patient was admitted to services today and that patient needs pain medications. Please return her call

## 2021-01-25 NOTE — Telephone Encounter (Signed)
I spoke with hospice nurse.  Rx sent to pharmacy.

## 2021-01-25 NOTE — Progress Notes (Signed)
I spoke with patient's hospice nurse, Benjamine Mola.  At hospice request, I sent a refill for oxycodone and transdermal fentanyl.

## 2021-01-26 ENCOUNTER — Inpatient Hospital Stay: Payer: Medicare HMO

## 2021-01-26 ENCOUNTER — Inpatient Hospital Stay: Payer: Medicare HMO | Admitting: Oncology

## 2021-01-26 ENCOUNTER — Inpatient Hospital Stay: Payer: Medicare HMO | Admitting: Hospice and Palliative Medicine

## 2021-01-27 ENCOUNTER — Telehealth: Payer: Self-pay | Admitting: *Deleted

## 2021-01-27 ENCOUNTER — Other Ambulatory Visit: Payer: Self-pay | Admitting: Oncology

## 2021-01-27 MED ORDER — MORPHINE SULFATE ER 15 MG PO TBCR: 15.0000 mg | EXTENDED_RELEASE_TABLET | Freq: Two times a day (BID) | ORAL | 0 refills | Status: AC

## 2021-01-27 NOTE — Telephone Encounter (Signed)
I spoke with patient's hospice nurse. Patient did not pick up the oxycodone sent earlier this week to the pharmacy. His pain has been subsequently worse and patient had on three 73mcg fentanyl patches at the time of the nurse's visit. Patient reports that the patches are not staying in place and that he has to keep replacing them. He did tell the nurse that oxycodone was managing pain well as long as he was taking it around the clock. She says that patient's swallowing is improved and that he is eating more since discharging from the hospital.   Will rotate from TD fentanyl to MS Contin. Nurse has provided patient/family with instructions regarding administration and that he must take LAO as directed.   Plan -D/C fentanyl -Start MS Contin 15mg  Q12H -Continue oxycodone 5-10mg  Q3H PRN for BTP -Bowel regimen

## 2021-01-27 NOTE — Telephone Encounter (Signed)
Anderson Malta with hospice requesting a return call from Sharion Dove, NP to discuss that she feels patient is overusing his pain medications. [phone]

## 2021-01-30 ENCOUNTER — Emergency Department

## 2021-01-30 ENCOUNTER — Other Ambulatory Visit: Payer: Self-pay

## 2021-01-30 ENCOUNTER — Emergency Department
Admission: EM | Admit: 2021-01-30 | Discharge: 2021-01-30 | Disposition: A | Discharge status: Home / Self Care | Attending: Emergency Medicine | Admitting: Emergency Medicine

## 2021-01-30 DIAGNOSIS — R58 Hemorrhage, not elsewhere classified: Secondary | ICD-10-CM | POA: Diagnosis not present

## 2021-01-30 DIAGNOSIS — Z85118 Personal history of other malignant neoplasm of bronchus and lung: Secondary | ICD-10-CM | POA: Diagnosis not present

## 2021-01-30 DIAGNOSIS — Z4682 Encounter for fitting and adjustment of non-vascular catheter: Secondary | ICD-10-CM | POA: Diagnosis not present

## 2021-01-30 DIAGNOSIS — Z79899 Other long term (current) drug therapy: Secondary | ICD-10-CM | POA: Diagnosis not present

## 2021-01-30 DIAGNOSIS — R000 Tachycardia, unspecified: Secondary | ICD-10-CM | POA: Insufficient documentation

## 2021-01-30 DIAGNOSIS — I5022 Chronic systolic (congestive) heart failure: Secondary | ICD-10-CM | POA: Diagnosis not present

## 2021-01-30 DIAGNOSIS — Z87891 Personal history of nicotine dependence: Secondary | ICD-10-CM | POA: Insufficient documentation

## 2021-01-30 DIAGNOSIS — E119 Type 2 diabetes mellitus without complications: Secondary | ICD-10-CM | POA: Diagnosis not present

## 2021-01-30 DIAGNOSIS — I110 Hypertensive heart disease with heart failure: Secondary | ICD-10-CM | POA: Diagnosis not present

## 2021-01-30 DIAGNOSIS — K9423 Gastrostomy malfunction: Secondary | ICD-10-CM | POA: Diagnosis not present

## 2021-01-30 DIAGNOSIS — Z515 Encounter for palliative care: Secondary | ICD-10-CM | POA: Insufficient documentation

## 2021-01-30 DIAGNOSIS — R0902 Hypoxemia: Secondary | ICD-10-CM | POA: Diagnosis not present

## 2021-01-30 DIAGNOSIS — I10 Essential (primary) hypertension: Secondary | ICD-10-CM | POA: Diagnosis not present

## 2021-01-30 DIAGNOSIS — K9429 Other complications of gastrostomy: Secondary | ICD-10-CM | POA: Diagnosis not present

## 2021-01-30 DIAGNOSIS — E860 Dehydration: Secondary | ICD-10-CM | POA: Insufficient documentation

## 2021-01-30 LAB — CBC
HCT: 42 % (ref 39.0–52.0)
Hemoglobin: 13.9 g/dL (ref 13.0–17.0)
MCH: 29.8 pg (ref 26.0–34.0)
MCHC: 33.1 g/dL (ref 30.0–36.0)
MCV: 90.1 fL (ref 80.0–100.0)
Platelets: 269 10*3/uL (ref 150–400)
RBC: 4.66 MIL/uL (ref 4.22–5.81)
RDW: 17.3 % — ABNORMAL HIGH (ref 11.5–15.5)
WBC: 16.8 10*3/uL — ABNORMAL HIGH (ref 4.0–10.5)
nRBC: 0 % (ref 0.0–0.2)

## 2021-01-30 LAB — BASIC METABOLIC PANEL
Anion gap: 20 — ABNORMAL HIGH (ref 5–15)
BUN: 21 mg/dL — ABNORMAL HIGH (ref 6–20)
CO2: 26 mmol/L (ref 22–32)
Calcium: 8.8 mg/dL — ABNORMAL LOW (ref 8.9–10.3)
Chloride: 93 mmol/L — ABNORMAL LOW (ref 98–111)
Creatinine, Ser: 0.57 mg/dL — ABNORMAL LOW (ref 0.61–1.24)
GFR, Estimated: >60 mL/min (ref >60)
Glucose, Bld: 113 mg/dL — ABNORMAL HIGH (ref 70–99)
Potassium: 3 mmol/L — ABNORMAL LOW (ref 3.5–5.1)
Sodium: 139 mmol/L (ref 135–145)

## 2021-01-30 MED ORDER — ZINC OXIDE 40 % EX OINT
TOPICAL_OINTMENT | Freq: Every day | CUTANEOUS | Status: DC
  Filled 2021-01-30: qty 113

## 2021-01-30 MED ORDER — SODIUM CHLORIDE 0.9 % IV BOLUS: 1000.0000 mL | Freq: Once | INTRAVENOUS | Status: DC

## 2021-01-30 MED ORDER — ONDANSETRON HCL 4 MG/2ML IJ SOLN: 4.0000 mg | Freq: Once | INTRAMUSCULAR | Status: DC

## 2021-01-30 MED ORDER — OXYCODONE HCL 5 MG PO TABS
10.0000 mg | ORAL_TABLET | Freq: Once | ORAL | Status: AC
  Administered 2021-01-30: 10 mg via ORAL
  Filled 2021-01-30: qty 2

## 2021-01-30 MED ORDER — HYDROMORPHONE HCL 1 MG/ML IJ SOLN
2.0000 mg | Freq: Once | INTRAMUSCULAR | Status: DC
Start: 2021-01-30 — End: 2021-01-30

## 2021-01-30 MED ORDER — DIATRIZOATE MEGLUMINE & SODIUM 66-10 % PO SOLN
30.0000 mL | Freq: Once | ORAL | Status: AC
  Administered 2021-01-30: 30 mL

## 2021-01-30 NOTE — ED Provider Notes (Signed)
[facility] Emergency Department Provider Note  ____________________________________________   Event Date/Time   First MD Initiated Contact with Patient 01/30/21 1529     (approximate)  I have reviewed the triage vital signs and the nursing notes.   HISTORY  Chief Complaint  G tube issue.   HPI Tyler Gallagher is a 60 y.o. male with diabetes, on hospice secondary to lung cancer who comes in for feeding tube replacement.  Patient states that the feeding tube came out yesterday.  He states it has been leaking fluid since then, constant, nothing makes it better, nothing makes it worse.  He does have redness around the tube that he states is secondary to the acid.  Patient states that he does not want to have the tube put back in.  He states that he can eat by mouth and he understands that he may decompensate again in the future but he does not want the tube to prolong his life.  He denies any fevers.        Past Medical History:  Diagnosis Date  . Depression   . Diabetes mellitus without complication ([place])   . Hyperlipidemia   . Hypertension   . Lesion of brain   . Lung cancer ([place])   . Mass of lung   . Seizures [facility])     Patient Active Problem List   Diagnosis Date Noted  . Gastrostomy complication ([place])   . Failure to thrive in adult 01/21/2021  . Protein-calorie malnutrition, severe 01/12/2021  . Seizure ([place]) 01/11/2021  . HTN (hypertension) 01/11/2021  . Seizures ([place])   . Leucocytosis   . Protein-calorie malnutrition, moderate ([place])   . Generalized weakness   . Dizziness 11/08/2020  . Vertigo 11/08/2020  . Chronic systolic CHF (congestive heart failure) ([place]) 11/08/2020  . Hypomagnesemia 11/08/2020  . Hypotension 11/08/2020  . Hypokalemia 11/08/2020  . Nausea vomiting and diarrhea 11/08/2020  . Anxiety 11/08/2020  . Palliative care encounter   . Chemotherapy induced neutropenia ([place]) 09/29/2020  . Goals of care,  counseling/discussion 07/08/2020  . Malignant neoplasm of lower lobe of right lung ([place]) 07/08/2020  . Brain metastases ([place]) 06/22/2020  . Vasogenic brain edema ([place]) 06/21/2020  . Hyperlipidemia 01/02/2016  . Hypertriglyceridemia 01/02/2016  . Tobacco abuse 01/02/2016  . Type 2 diabetes mellitus without complication, without long-term current use of insulin ([place]) 01/02/2016  . Obesity 01/02/2016  . S/P AKA (above knee amputation) unilateral ([place]) 01/02/2016  . Amaurosis fugax 01/01/2016  . TIA (transient ischemic attack) 01/01/2016    Past Surgical History:  Procedure Laterality Date  . 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 COLONOSCOPY    . CYSTOSCOPY W/ RETROGRADES Bilateral 07/14/2020   Procedure: CYSTOSCOPY WITH RETROGRADE PYELOGRAM;  Surgeon: Hollice Espy, MD;  Location: [facility] ORS;  Service: Urology;  Laterality: Bilateral;  . PORTA CATH INSERTION N/A 08/03/2020   Procedure: PORTA CATH INSERTION;  Surgeon: Katha Cabal, MD;  Location: [place] CV LAB;  Service: Cardiovascular;  Laterality: N/A;  . PORTA CATH INSERTION N/A 10/03/2020   Procedure: PORTA CATH INSERTION;  Surgeon: Algernon Huxley, MD;  Location: [place] CV LAB;  Service: Cardiovascular;  Laterality: N/A;  . VIDEO BRONCHOSCOPY WITH ENDOBRONCHIAL NAVIGATION N/A 07/01/2020   Procedure: VIDEO BRONCHOSCOPY WITH ENDOBRONCHIAL NAVIGATION;  Surgeon: [name], MD;  Location: [facility] ORS;  Service: Thoracic;  Laterality: N/A;  . VIDEO BRONCHOSCOPY WITH ENDOBRONCHIAL ULTRASOUND N/A 07/01/2020   Procedure: VIDEO BRONCHOSCOPY WITH ENDOBRONCHIAL ULTRASOUND;  Surgeon:  [name], MD;  Location: [facility] ORS;  Service: Thoracic;  Laterality: N/A;    Prior to Admission medications   Medication Sig Start Date End Date Taking? Authorizing Provider  dexamethasone (DECADRON) 4 MG tablet Place 1 tablet (4 mg total) into feeding tube every 6 (six) hours. 01/17/21 02/16/21  Donne Hazel, MD  dronabinol (MARINOL) 5 MG capsule Take 1  capsule (5 mg total) by mouth 2 (two) times daily before a meal. 12/05/20   [name], MD  levETIRAcetam (KEPPRA) 500 MG tablet Take 1 tablet (500 mg total) by mouth 2 (two) times daily. 01/08/21 18-Feb-2021  Rehman, Areeg N, DO  metoprolol succinate (TOPROL-XL) 25 MG 24 hr tablet Take 0.5 tablets (12.5 mg total) by mouth daily. 11/15/20   Kate Sable, MD  morphine (MS CONTIN) 15 MG 12 hr tablet Take 1 tablet (15 mg total) by mouth every 12 (twelve) hours. 01/27/21   Borders, Kirt Boys, NP  nicotine (NICODERM CQ - DOSED IN MG/24 HOURS) 21 mg/24hr patch PLACE 1 PATCH (21 MG TOTAL) ONTO THE SKIN DAILY. 01/27/21   Borders, Kirt Boys, NP  Nutritional Supplements (FEEDING SUPPLEMENT, OSMOLITE 1.5 CAL,) LIQD Place 237 mLs into feeding tube every 3 (three) hours. 01/17/21 02/16/21  Donne Hazel, MD  oxyCODONE (ROXICODONE) 5 MG immediate release tablet Take 1-2 tablets (5-10 mg total) by mouth every 3 (three) hours as needed. 01/25/21   Borders, Kirt Boys, NP  triamcinolone ointment (KENALOG) 0.5 % APPLY TO AFFECTED AREA TWICE A DAY 01/27/21   Borders, Kirt Boys, NP  vitamin B-12 (CYANOCOBALAMIN) 1000 MCG tablet Take 1,000 mcg by mouth daily.    [provider]    Allergies Penicillins  Family History  Problem Relation Age of Onset  . Diabetes Sister   . Diabetes Brother     Social History Social History   Tobacco Use  . Smoking status: Former Smoker    Packs/day: 2.00    Years: 32.00    Pack years: 64.00    Types: Cigarettes    Quit date: 06/21/2020    Years since quitting: 0.6  . Smokeless tobacco: Never Used  Vaping Use  . Vaping Use: Never used  Substance Use Topics  . Alcohol use: No    Alcohol/week: 0.0 standard drinks  . Drug use: Not Currently    Types: Marijuana      Review of Systems Constitutional: No fever/chills Eyes: No visual changes. ENT: No sore throat. Cardiovascular: Denies chest pain. Respiratory: Denies shortness of breath. Gastrointestinal: G-tube  wound leaking fluids Genitourinary: Negative for dysuria. Musculoskeletal: Negative for back pain. Skin: Negative for rash. Neurological: Negative for headaches, focal weakness or numbness. All other ROS negative ____________________________________________   PHYSICAL EXAM:  VITAL SIGNS: ED Triage Vitals  Enc Vitals Group     BP 01/30/21 1507 (!) 89/71     Pulse Rate 01/30/21 1507 (!) 125     Resp 01/30/21 1507 19     Temp 01/30/21 1507 (!) 97.4 F (36.3 C)     Temp Source 01/30/21 1507 Oral     SpO2 01/30/21 1507 96 %     Weight --      Height --      Head Circumference --      Peak Flow --      Pain Score 01/30/21 1510 4     Pain Loc --      Pain Edu? --      Excl. in [place]? --  Constitutional: Alert and oriented. Well appearing and in no acute distress. Eyes: Conjunctivae are normal. EOMI. Head: Atraumatic. Nose: No congestion/rhinnorhea. Mouth/Throat: Mucous membranes are moist.   Neck: No stridor. Trachea Midline. FROM Cardiovascular: tachycardia, regular rhythm. Grossly normal heart sounds.  Good peripheral circulation. Respiratory: Normal respiratory effort.  No retractions. Lungs CTAB. Gastrointestinal: Soft and nontender.  Patient has a 1 cm hole from his abdomen leaking  material with excoriations on the outside. Musculoskeletal: No lower extremity tenderness nor edema.  No joint effusions. Neurologic:  Normal speech and language. No gross focal neurologic deficits are appreciated.  Skin:  Skin is warm, dry and intact. No rash noted. Psychiatric: Mood and affect are normal. Speech and behavior are normal. GU: Deferred   ____________________________________________   LABS (all labs ordered are listed, but only abnormal results are displayed)  Labs Reviewed  CBC - Abnormal; Notable for the following components:      Result Value   WBC 16.8 (*)    RDW 17.3 (*)    All other components within normal limits  BASIC METABOLIC PANEL - Abnormal; Notable for  the following components:   Potassium 3.0 (*)    Chloride 93 (*)    Glucose, Bld 113 (*)    BUN 21 (*)    Creatinine, Ser 0.57 (*)    Calcium 8.8 (*)    Anion gap 20 (*)    All other components within normal limits   ____________________________________________    PROCEDURES  Procedure(s) performed (including Critical Care):  Procedures   ____________________________________________   INITIAL IMPRESSION / ASSESSMENT AND PLAN / ED COURSE  RONALD LONDO was evaluated in Emergency Department on 01/30/2021 for the symptoms described in the history of present illness. He was evaluated in the context of the global COVID-19 pandemic, which necessitated consideration that the patient might be at risk for infection with the SARS-CoV-2 virus that causes COVID-19. Institutional protocols and algorithms that pertain to the evaluation of patients at risk for COVID-19 are in a state of rapid change based on information released by regulatory bodies including the CDC and federal and state organizations. These policies and algorithms were followed during the patient's care in the ED.    Patient is a 60 year old who comes in with material leaking from his where his G-tube was.  Patient's abdomen is soft and nontender and low suspicion for peritonitis at this time.  He does have excoriations around the G-tube site from the acid leaking on his abdomen.  Labs ordered to evaluate for Electra abnormalities, AKI patient was noted to be hypotensive and tachycardic.  According to patient this is how he always runs.    Discussed with Dr. Celine Ahr from surgery.  Unfortunately they cannot offer surgery to close the hole and patient does not want to to place back in.  She recommended Desitin barrier cream and ABD dressings and hopefully eventually will close off.  Discussed with the hospice liaison who will have hospice check in with him and adjust his pain medications.  Discussed with patient he states that  now he does want to to back in because he cannot state how much is leaking out.  Dr. Celine Ahr came and replaced the tube.  Tube check was done that showed it was in place.  There is still some leaking around the tube it is better than previously.  Patient's labs do show some dehydration with his anion gap being elevated.  Patient heart rate is elevated and he is slightly hypotensive.  Patient understands that he is dying that this is all just temporary solution.  He does not want to be admitted he just wanted to have the pain in the leaking adjusted.  Leaking is now better and hospice will check in on him tomorrow to help adjust pain meds.  He may benefit from more pain patches due to the leaking of fluids from around the tube.  We offered IV fluids but he declined at this time.  He states that he just wants to go home.  Patient is tolerating p.o.  Patient will be discharged with hospice service following up with him tomorrow    ______________________________________   FINAL CLINICAL IMPRESSION(S) / ED DIAGNOSES   Final diagnoses:  Malfunction of gastrostomy tube [facility])  Hospice care patient  Dehydration      MEDICATIONS GIVEN DURING THIS VISIT:  Medications  liver oil-zinc oxide (DESITIN) 40 % ointment (has no administration in time range)  ondansetron (ZOFRAN) injection 4 mg (has no administration in time range)  diatrizoate meglumine-sodium (GASTROGRAFIN) 66-10 % solution 30 mL (30 mLs Per Tube Given 01/30/21 1743)  oxyCODONE (Oxy IR/ROXICODONE) immediate release tablet 10 mg (10 mg Oral Given 01/30/21 1809)     ED Discharge Orders    None       Note:  This document was prepared using Dragon voice recognition software and may include unintentional dictation errors.   Vanessa [place], MD 01/30/21 [phone]

## 2021-01-30 NOTE — Discharge Instructions (Addendum)
You can get [place] cream over-the-counter to place on the wounds.   Surgery put the tube back in but there is still going to be some leakage.  Hospice team is going to try to see you tomorrow to help discuss your pain control

## 2021-01-30 NOTE — Progress Notes (Signed)
I was contacted again by the emergency department as the patient has decided that he wanted to have his tube replaced.  An 33 French gastrostomy tube was easily placed into the existing tract.  20 cc of water were used to inflate the balloon.  The bolster was cinched and the balloon drawn up against the abdominal wall.  The tube flushed easily.  I recommended to the emergency department provider that an injection study be performed prior to use to confirm intraluminal placement, as the tube has been out for over 24 hours.  Once this has been done, the tube should be able to be used as needed.

## 2021-01-30 NOTE — Progress Notes (Addendum)
[facility]) Hospital Liaison RN Note:  This patient is currently under hospice services with [facility], recently being treated for lung cancer with metatasis to brain. He is a DNR.   Spoke with Dr. Marjean Donna over the phone to confirm that patient is indeed an [place] patient. He presents to the ED due to his feeding tube coming out. He has had leakage from the tube previously. Per conversation with Dr. Jari Pigg, patient does not want the tube replaced. His concerns are that he is not able to get his meds adequately due to the leakage of fluid and does not feel his pain is being controlled. He will receive some IVF and IV pain medication with the intention of going home this evening. [place] Liaison will relay to patient's hospice care team these concerns to be addressed.  Please call with any hospice related questions or concerns.  Loney Laurence [facility] Liaison [phone]

## 2021-01-30 NOTE — ED Notes (Signed)
Meds held per EDP. BP manually auscultated. Feeding tube with leaking around stoma, EDP aware. No ability for [place] to fix at this time. Pt to be d/c'd home for hospice care. Pt attempting to call trx, but states he will probably need ems trx. Will put in request when certain

## 2021-01-30 NOTE — ED Triage Notes (Signed)
Pt is here from home under hospice care, pt feeding tube came out. Pt is alert, pt has metastatic cancer. Pt has his medications with him, concerned that his wife it taking his pain medcaitons  103 98%RA 105/75

## 2021-01-30 NOTE — Progress Notes (Addendum)
Contacted by the ED regarding patient with prior G-tube (placed by Dr. Dahlia Byes).  He has presented with leakage in the last week, at which time the tube was upsized and additional water added to the balloon. Dr. Dahlia Byes discussed with the patient as follows:  "Seen and examined. G tube upsized due to leak. Abd soft, no peritonitis. He is terminal. I placed 5 cc extra of water and place tabe on top of bolster.  D/ W he wishes to have the tube remove regardless, I did warn him that this will increase the leak. He is up to the point of going to hospice and just wants to be comfortable . I can remove it before he is DC if he still wants this. RN instructed to place binder."  Patient is now on hospice care. He came to the ED today because his tube came out last night.  There is some leakage from the site, as cautioned would occur by Dr. Dahlia Byes. He does not want the tube replaced. At this time, no surgical intervention is advised.  Would protect surrounding skin with barrier cream/Desitin and use ABD pads to manage leakage.

## 2021-01-30 NOTE — ED Triage Notes (Signed)
Pt comes via EMS from home with c/o needing peg tube replaced. Pt states he is in hospice care and was just sent here to get that tube fixed. Pt states he is eat up with cancer and just wants to go home.

## 2021-01-31 ENCOUNTER — Telehealth: Payer: Self-pay | Admitting: Hospice and Palliative Medicine

## 2021-01-31 DIAGNOSIS — C3431 Malignant neoplasm of lower lobe, right bronchus or lung: Secondary | ICD-10-CM | POA: Diagnosis not present

## 2021-01-31 DIAGNOSIS — R634 Abnormal weight loss: Secondary | ICD-10-CM | POA: Diagnosis not present

## 2021-01-31 MED ORDER — FENTANYL 50 MCG/HR TD PT72: 1.0000 | MEDICATED_PATCH | TRANSDERMAL | 0 refills | Status: AC

## 2021-01-31 MED ORDER — OXYCODONE HCL 5 MG PO TABS: 5.0000 mg | ORAL_TABLET | ORAL | 0 refills | Status: DC | PRN

## 2021-01-31 NOTE — Telephone Encounter (Signed)
Received a call from patient's hospice nurse.  Patient was seen in the ER yesterday to replace PEG after it was displaced in the home.  However, reportedly leakage has continued unabated regardless of whether the tube was in place or not.  PEG is not really being utilized at this point for medication administration or nutritional management.  Discussed with [name] and nurse will talk with patient at time of next visit whether or not he wants to DC the PEG.  Patient continues to have significant excoriation around the PEG site from leakage.  Okay to try barrier cream and ostomy or wound bag for drainage management.  Nurse also reports that patient is missing a significant quantity of the pain medications written by me 5 days ago.  Med count of MS Contin is approximately 3 (30 tablets filled on 1/28) and oxycodone 5 tablets (90 tablets filled on 1/28).   Nurse reports the patient is concerned that his girlfriend is diverting pain medication.  Nurse and social worker made a joint visit with establishment of clear administration instructions and plan for future opioid use.  A nurse will make a visit tonight to establish a lockbox, in which all pain medication will be placed.  Only patient and son will have the key.  Will rotate patient back to transdermal fentanyl patches from MS Contin as to allow for better documented control.  Nurse will date and apply patches.  We will go ahead and refill oxycodone but limit supply to only 45 tablets.  Nurse will conduct pill counts at next visit tomorrow and Thursday.  Case and plan discussed with [name]

## 2021-02-01 ENCOUNTER — Telehealth: Payer: Medicare HMO | Admitting: Hospice and Palliative Medicine

## 2021-02-03 ENCOUNTER — Telehealth: Payer: Self-pay | Admitting: Hospice and Palliative Medicine

## 2021-02-03 MED ORDER — OXYCODONE HCL 10 MG PO TABS: 10.0000 mg | ORAL_TABLET | ORAL | 0 refills | Status: AC | PRN

## 2021-02-03 NOTE — Telephone Encounter (Signed)
I received a call from patient's hospice nurse.  She saw patient in the home both yesterday and today.  She is using a wound bag to manage leakage of PEG.  She reports this is working fairly well and patient says he got the best sleep last night that he has recently.  Nurse tells me that patient appeared significantly more comfortable today.   Frequent pill counts have demonstrated that patient appears to be taking the oxycodone as directed.  Son is dispensing medication via lockbox.  They are also keeping record of dosing and time of administration.  Nurse tells me that patient is generally taking two 5mg  oxycodone tablets every 4 hours around the clock. He is due for a refill.   Will increase oxycodone to 10mg  tablets as to reduce pill burden.  Plan: -Oxycodone 10mg  Q4H PRN #45

## 2021-02-27 ENCOUNTER — Encounter: Payer: Medicare HMO | Admitting: Hospice and Palliative Medicine

## 2021-02-28 DEATH — deceased

## 2021-03-22 ENCOUNTER — Ambulatory Visit: Payer: Medicare HMO | Admitting: Radiation Oncology

## 2021-05-08 ENCOUNTER — Ambulatory Visit: Payer: Medicare HMO | Admitting: Radiation Oncology

## 2022-01-04 IMAGING — MR MR HEAD WO/W CM
7 of 13 series · 23 of 48 positions shown · IV contrast (gadavist)
Comparison: 11/08/2020

CLINICAL DATA: Right-sided weakness, history of metastatic lung
cancer with history of resection and whole brain radiation

EXAM:
MRI HEAD WITHOUT AND WITH CONTRAST
TECHNIQUE: Multiplanar, multiecho pulse sequences of the brain and surrounding
structures were obtained without and with intravenous contrast.
CONTRAST:  8mL GADAVIST GADOBUTROL 1 MMOL/ML IV SOLN

[Series 3: DWI · axial · 3.0mm · 0.94mm/px · z∈[-6,+149]mm · 8 of 106 slices shown (1 of 2)]
[im 1/106]
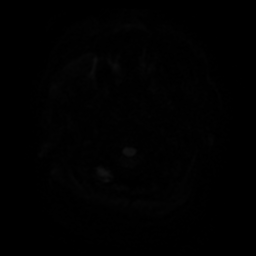
[im 16/106]
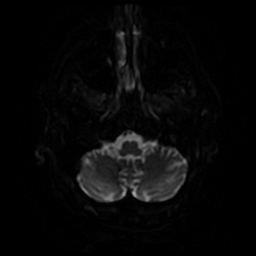
[im 31/106]
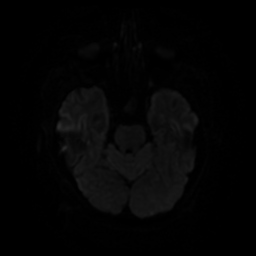
[im 46/106]
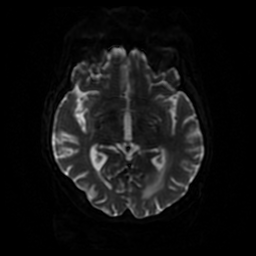
[im 61/106]
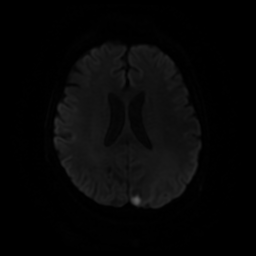
[im 76/106]
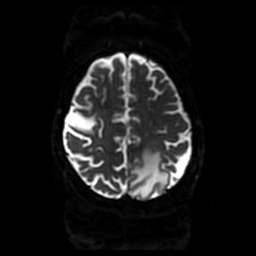
[im 91/106]
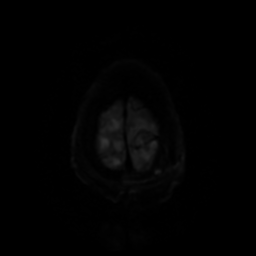
[im 106/106]
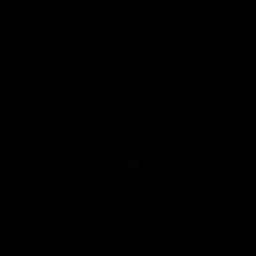

[Series 4: DWI · coronal · 4.0mm · 0.94mm/px · 5 of 74 slices shown (2 of 2)]
[im 1/74]
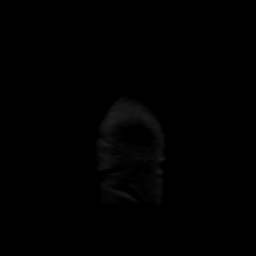
[im 19/74]
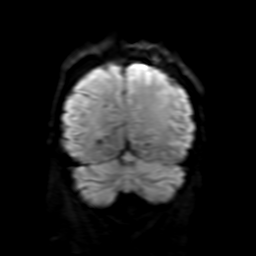
[im 37/74]
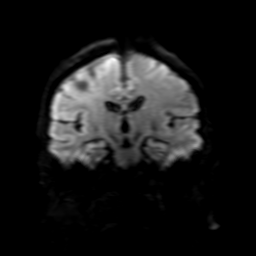
[im 55/74]
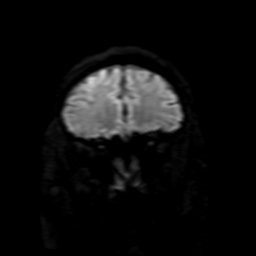
[im 74/74]
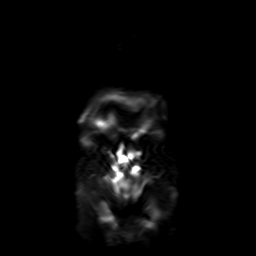

[Series 5: FLAIR · sagittal · 5.0mm · 0.23mm/px · 2 of 26 slices shown (1 of 2)]
[im 1/26]
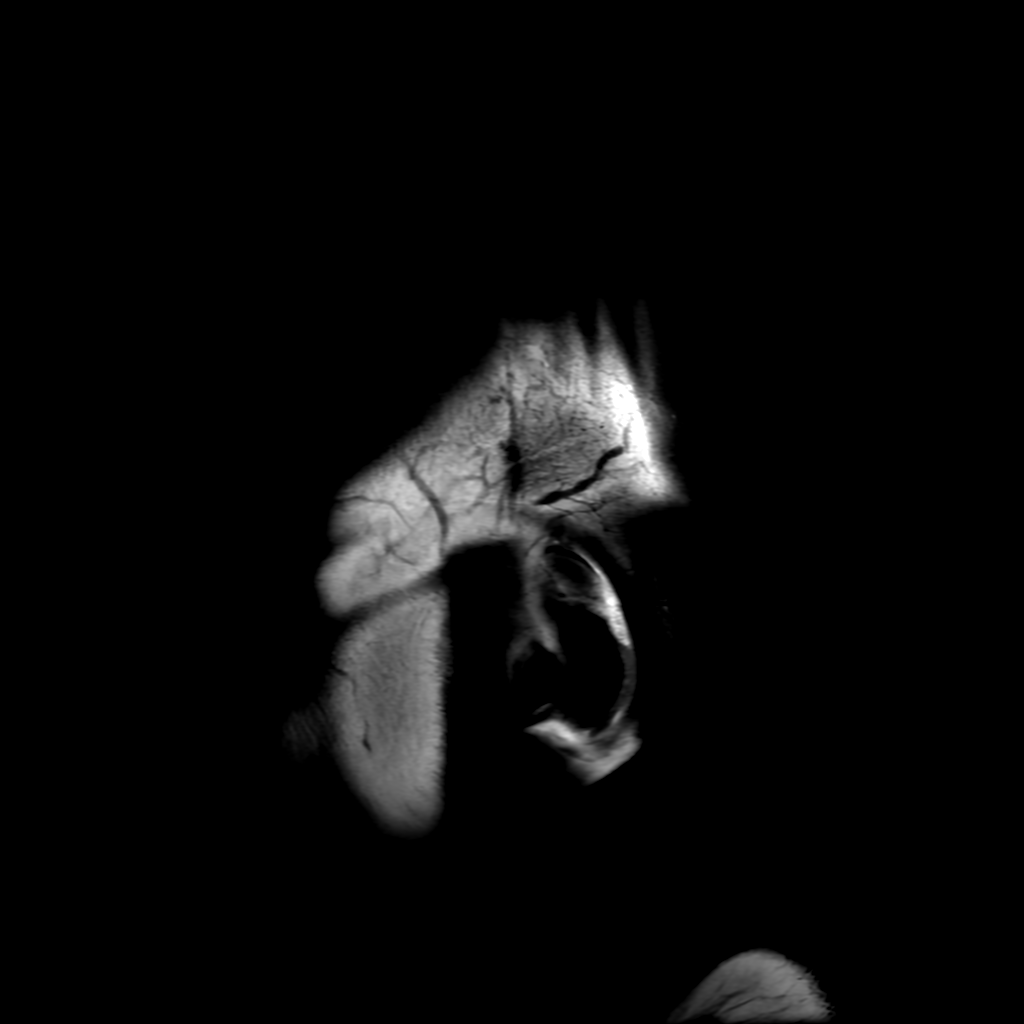
[im 26/26]
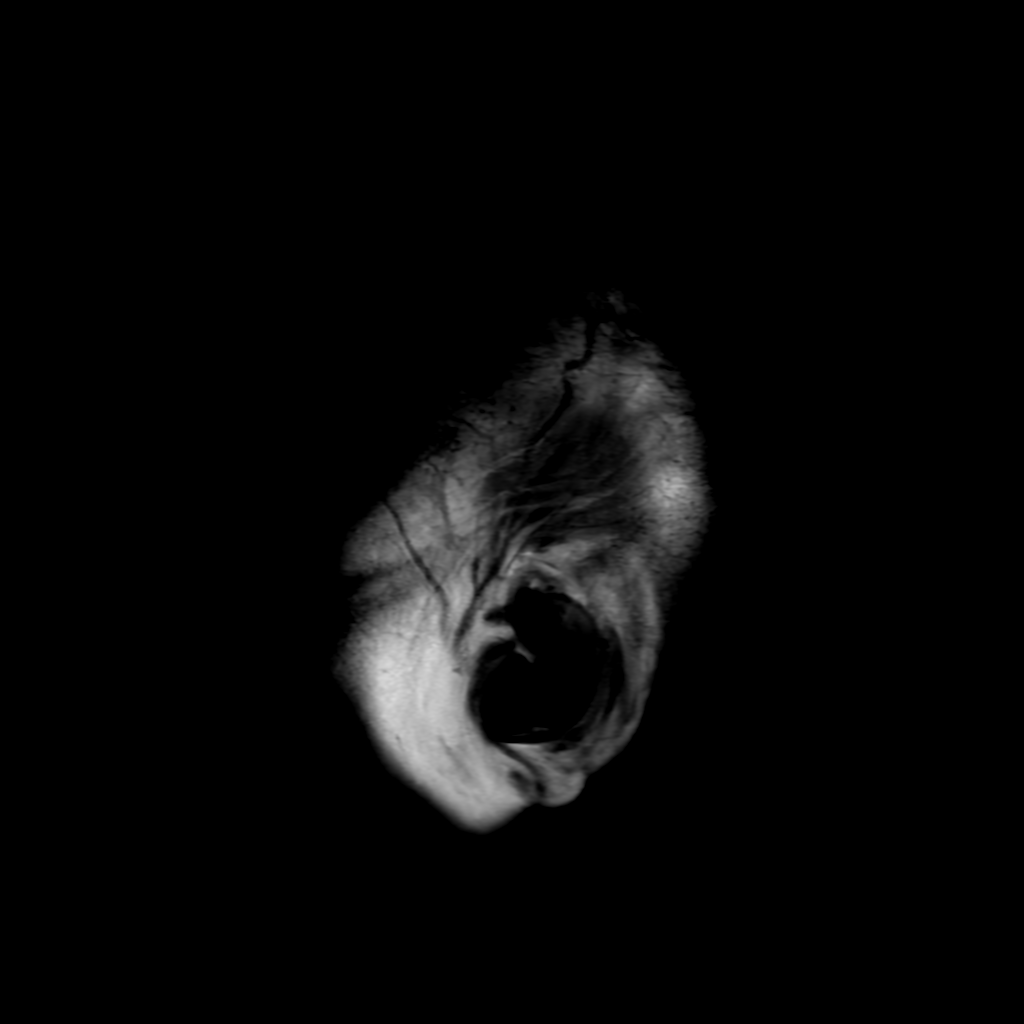

[Series 6: T2 · axial · 5.0mm · 0.23mm/px · 1 of 28 slices shown]
[im 1/28]
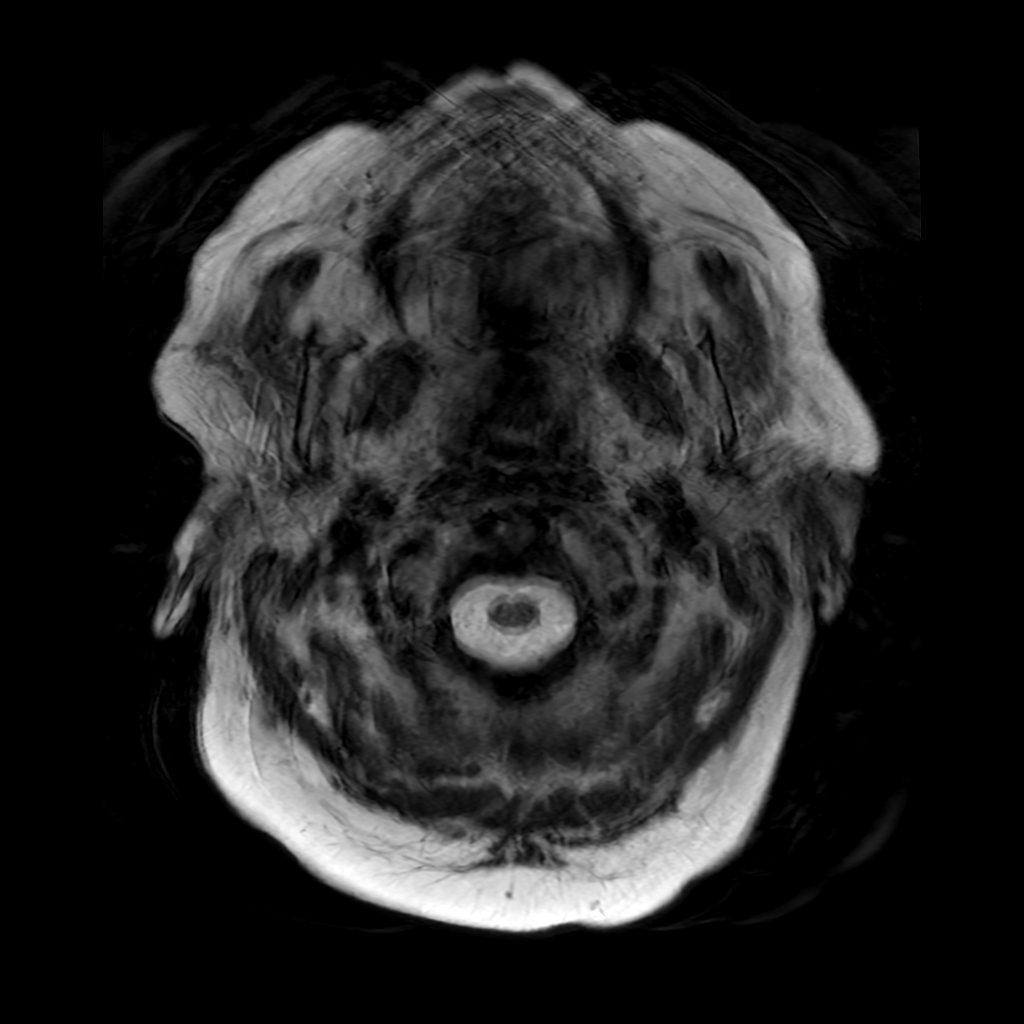

[Series 7: FLAIR · axial · 3.0mm · 0.45mm/px · z∈[-12,+143]mm · 2 of 27 slices shown (2 of 2)]
[im 1/27]
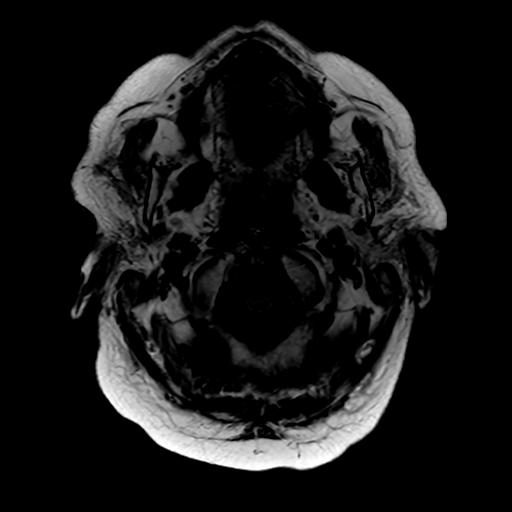
[im 27/27]
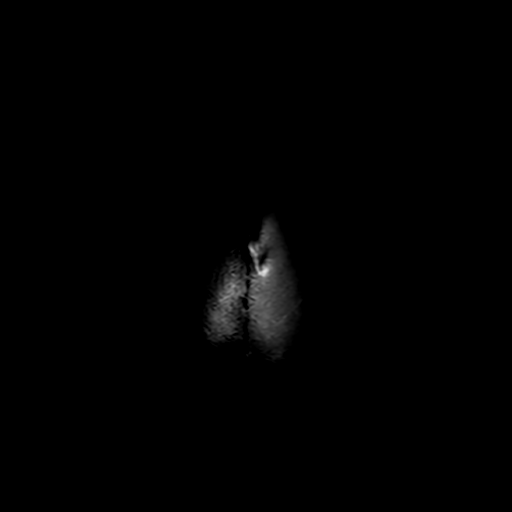

[Series 350: ADC · axial · 3.0mm · 0.94mm/px · z∈[-6,+149]mm · 3 of 53 slices shown (1 of 2)]
[im 1/53]
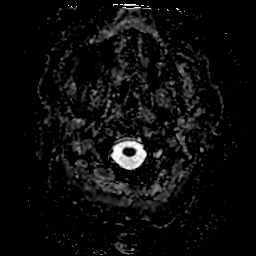
[im 27/53]
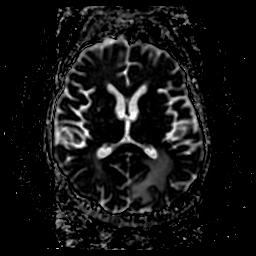
[im 53/53]
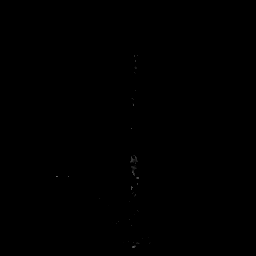

[Series 450: ADC · coronal · 4.0mm · 0.94mm/px · 2 of 37 slices shown (2 of 2)]
[im 1/37]
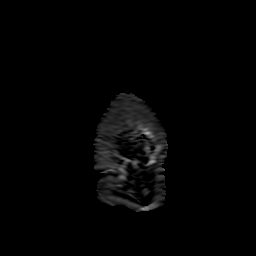
[im 37/37]
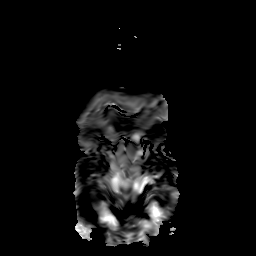

[23 of 48 positions shown; findings below may reference images not displayed]

FINDINGS: Brain: In the parasagittal left parietal lobe, there is a
heterogeneously enhancing, partially necrotic lesion measuring 1.6 x
1.2 cm at the location of prior 0.6 cm lesion. There is extensive
surrounding edema.

Slightly increased enhancement in the parasagittal left frontal lobe
at the inferior margin of the resection cavity.

The punctate foci of enhancement in the left frontoparietal region
on the prior study are not seen although there is greater motion
artifact on this study.

Encephalomalacia at the site of prior right frontal infarct. There
is no acute infarction. No hydrocephalus. No significant mass
effect.

Vascular: Major vessel flow voids at the skull base are preserved.

Skull and upper cervical spine: Left vertex craniotomy. Normal
marrow signal is preserved.

Sinuses/Orbits: Paranasal sinuses are aerated. Orbits are
unremarkable.

Other: Sella is unremarkable.  Mastoid air cells are clear.
IMPRESSION: Increase in size and heterogeneity of parasagittal left parietal
lesion with new extensive surrounding edema. Likely reflects
progression of metastatic disease. However, given history of
radiation, disproportionate edema, and appearance of enhancement,
treatment effect is a consideration.

Punctate foci of enhancement in the left frontoparietal region are
no longer seen.

## 2022-01-09 IMAGING — CR DG CHEST 2V
2 series · 2 of 2 positions shown · non-contrast
Comparison: Chest radiograph August 18, 2020; chest CT November 08, 2020

CLINICAL DATA: Tachycardia.  History of lung carcinoma

EXAM:
CHEST - 2 VIEW

[chest lat]
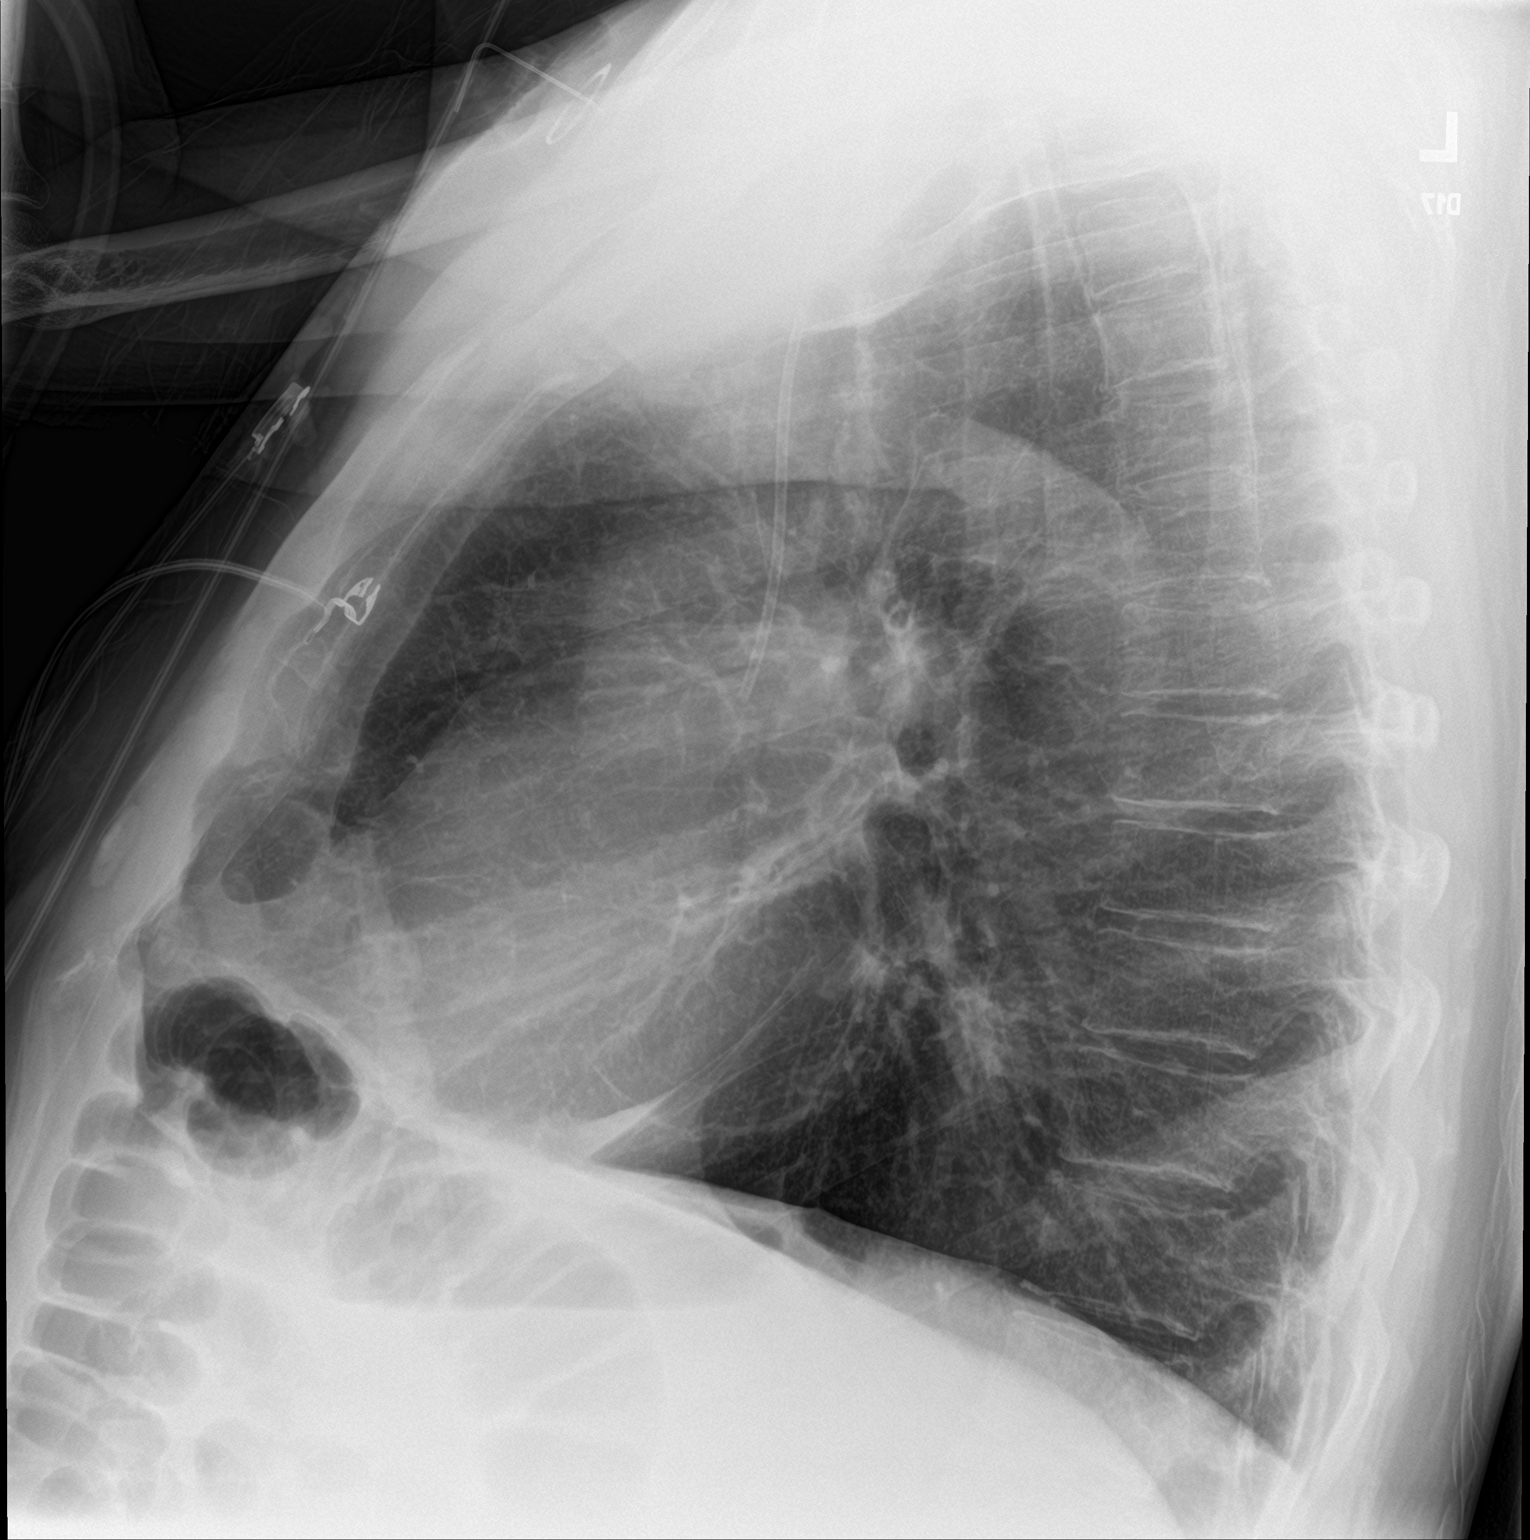

[chest ap]
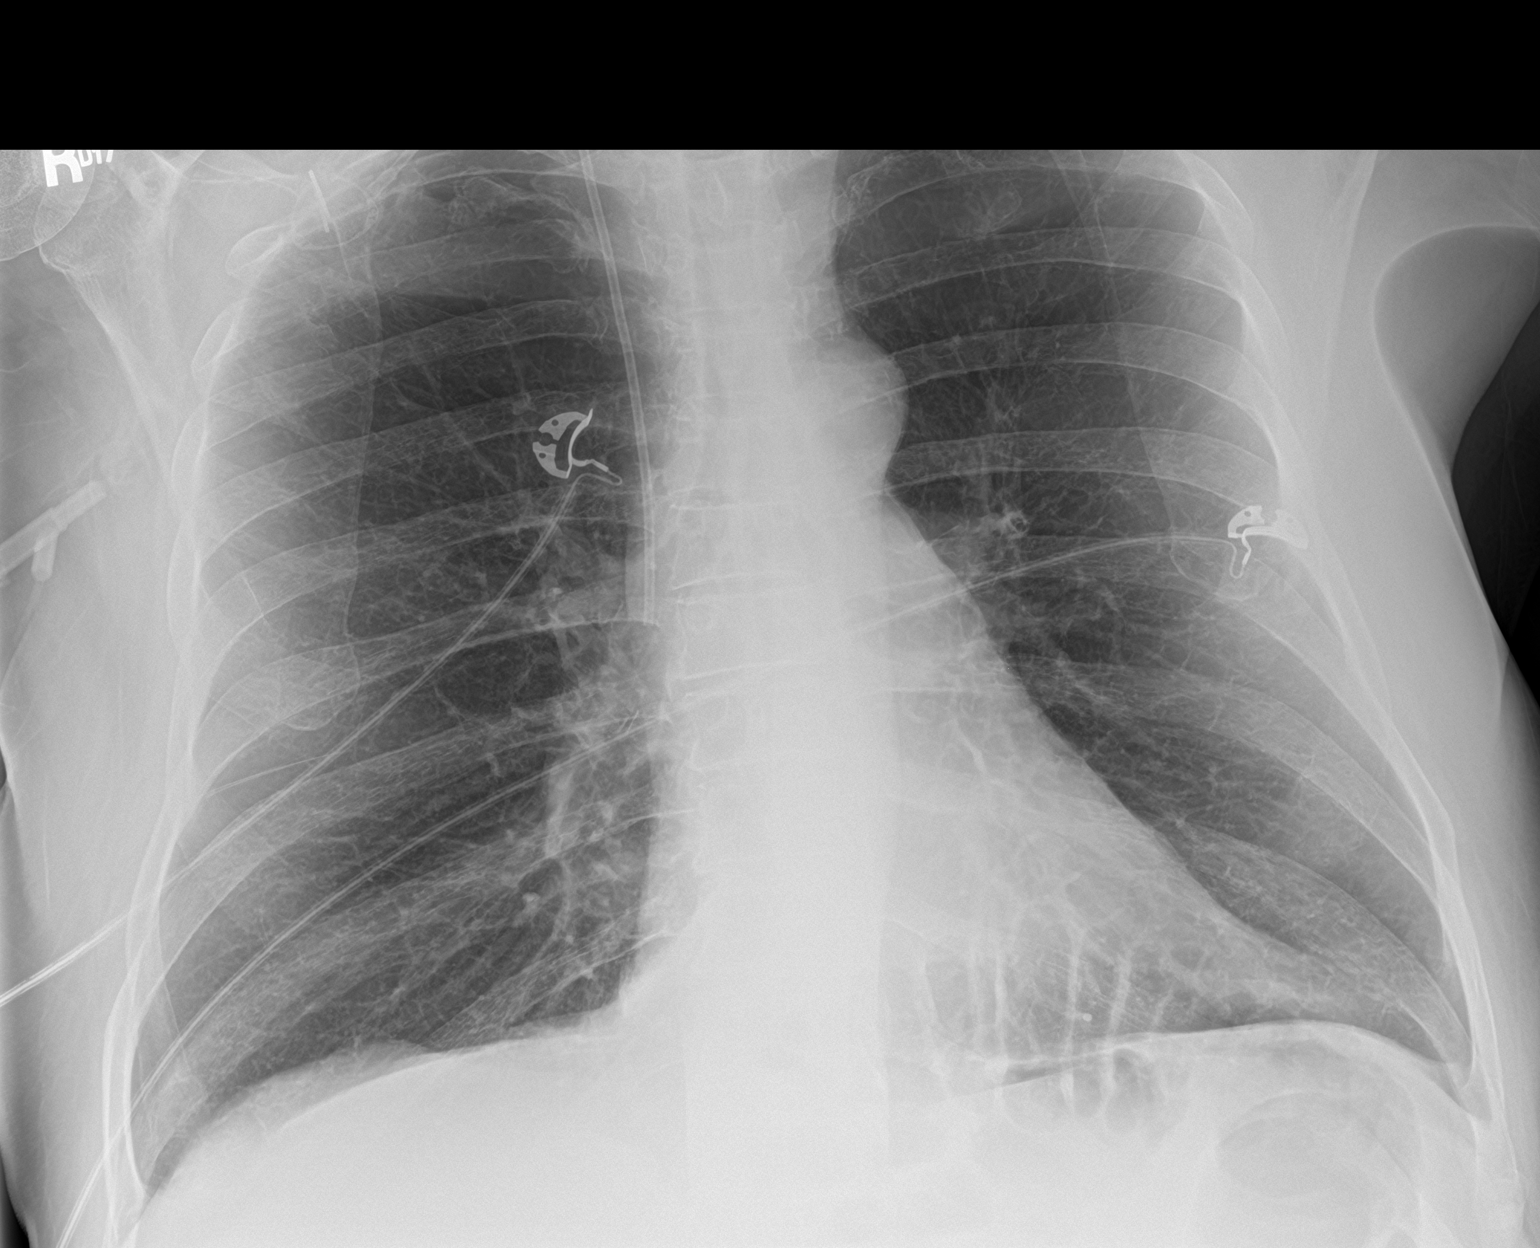

[2 of 2 positions shown; findings below may reference images not displayed]

FINDINGS: Port-A-Cath tip is in the superior vena cava. No pneumothorax. There
is underlying emphysematous change with apparent bullous disease in
the upper lobes. There is no edema or airspace opacity. The
previously noted right lower lobe mass seen on most recent CT is not
appreciable by radiography.

Heart size is normal. Pulmonary vascularity reflects underlying
emphysematous change. No adenopathy. No bone lesions.
IMPRESSION: Port-A-Cath tip in superior vena cava. No pneumothorax. Underlying
emphysematous change. No edema or airspace opacity. Right lower
lobe mass noted on previous CT not appreciable by radiography. Heart
size within normal limits. No adenopathy appreciable by radiography.

Emphysema ([id number]).

## 2023-11-05 NOTE — Telephone Encounter (Signed)
Old note, Signing encounter to close chart

## 2023-11-05 NOTE — Telephone Encounter (Signed)
Signing encounter to close chart
